# Patient Record
Sex: Male | Born: 1940
Health system: Southern US, Community
[De-identification: ages and names within clinical notes are randomized; demographics above are authoritative.]

## PROBLEM LIST (undated history)

## (undated) DIAGNOSIS — R51 Headache: Secondary | ICD-10-CM

## (undated) DIAGNOSIS — K8020 Calculus of gallbladder without cholecystitis without obstruction: Secondary | ICD-10-CM

## (undated) DIAGNOSIS — R1013 Epigastric pain: Secondary | ICD-10-CM

## (undated) DIAGNOSIS — R220 Localized swelling, mass and lump, head: Secondary | ICD-10-CM

## (undated) DIAGNOSIS — R5383 Other fatigue: Secondary | ICD-10-CM

## (undated) DIAGNOSIS — F329 Major depressive disorder, single episode, unspecified: Secondary | ICD-10-CM

## (undated) DIAGNOSIS — N478 Other disorders of prepuce: Secondary | ICD-10-CM

## (undated) DIAGNOSIS — R221 Localized swelling, mass and lump, neck: Secondary | ICD-10-CM

## (undated) DIAGNOSIS — N471 Phimosis: Secondary | ICD-10-CM

## (undated) DIAGNOSIS — H6690 Otitis media, unspecified, unspecified ear: Secondary | ICD-10-CM

## (undated) DIAGNOSIS — R42 Dizziness and giddiness: Secondary | ICD-10-CM

## (undated) DIAGNOSIS — E669 Obesity, unspecified: Secondary | ICD-10-CM

## (undated) DIAGNOSIS — I498 Other specified cardiac arrhythmias: Secondary | ICD-10-CM

## (undated) DIAGNOSIS — E119 Type 2 diabetes mellitus without complications: Secondary | ICD-10-CM

## (undated) DIAGNOSIS — R143 Flatulence: Secondary | ICD-10-CM

## (undated) DIAGNOSIS — R141 Gas pain: Secondary | ICD-10-CM

## (undated) DIAGNOSIS — R142 Eructation: Secondary | ICD-10-CM

## (undated) DIAGNOSIS — Z86010 Personal history of colonic polyps: Secondary | ICD-10-CM

## (undated) DIAGNOSIS — R972 Elevated prostate specific antigen [PSA]: Secondary | ICD-10-CM

## (undated) DIAGNOSIS — M1710 Unilateral primary osteoarthritis, unspecified knee: Secondary | ICD-10-CM

## (undated) DIAGNOSIS — R5381 Other malaise: Secondary | ICD-10-CM

## (undated) DIAGNOSIS — E041 Nontoxic single thyroid nodule: Secondary | ICD-10-CM

## (undated) DIAGNOSIS — Z9049 Acquired absence of other specified parts of digestive tract: Secondary | ICD-10-CM

## (undated) DIAGNOSIS — I10 Essential (primary) hypertension: Secondary | ICD-10-CM

## (undated) DIAGNOSIS — G5600 Carpal tunnel syndrome, unspecified upper limb: Secondary | ICD-10-CM

## (undated) DIAGNOSIS — R079 Chest pain, unspecified: Secondary | ICD-10-CM

## (undated) DIAGNOSIS — K219 Gastro-esophageal reflux disease without esophagitis: Secondary | ICD-10-CM

## (undated) DIAGNOSIS — E785 Hyperlipidemia, unspecified: Secondary | ICD-10-CM

## (undated) DIAGNOSIS — C73 Malignant neoplasm of thyroid gland: Secondary | ICD-10-CM

## (undated) HISTORY — DX: Headache: R51

## (undated) HISTORY — PX: CHOLECYSTECTOMY: SHX55

## (undated) HISTORY — PX: OTHER SURGICAL HISTORY: SHX169

## (undated) HISTORY — DX: Gas pain: R14.1

## (undated) HISTORY — DX: Obesity, unspecified: E66.9

## (undated) HISTORY — DX: Calculus of gallbladder without cholecystitis without obstruction: K80.20

## (undated) HISTORY — DX: Acquired absence of other specified parts of digestive tract: Z90.49

## (undated) HISTORY — DX: Eructation: R14.2

## (undated) HISTORY — DX: Malignant neoplasm of thyroid gland: C73

## (undated) HISTORY — DX: Gastro-esophageal reflux disease without esophagitis: K21.9

## (undated) HISTORY — DX: Unilateral primary osteoarthritis, unspecified knee: M17.10

## (undated) HISTORY — DX: Major depressive disorder, single episode, unspecified: F32.9

## (undated) HISTORY — DX: Personal history of colonic polyps: Z86.010

## (undated) HISTORY — DX: Essential (primary) hypertension: I10

## (undated) HISTORY — DX: Phimosis: N47.1

## (undated) HISTORY — DX: Type 2 diabetes mellitus without complications: E11.9

## (undated) HISTORY — DX: Nontoxic single thyroid nodule: E04.1

## (undated) HISTORY — DX: Localized swelling, mass and lump, neck: R22.1

## (undated) HISTORY — DX: Dizziness and giddiness: R42

## (undated) HISTORY — PX: THYROID SURGERY: SHX805

## (undated) HISTORY — DX: Other fatigue: R53.83

## (undated) HISTORY — DX: Flatulence: R14.3

## (undated) HISTORY — DX: Elevated prostate specific antigen (PSA): R97.20

## (undated) HISTORY — DX: Other disorders of prepuce: N47.8

## (undated) HISTORY — DX: Localized swelling, mass and lump, head: R22.0

## (undated) HISTORY — DX: Epigastric pain: R10.13

## (undated) HISTORY — DX: Carpal tunnel syndrome, unspecified upper limb: G56.00

## (undated) HISTORY — DX: Other malaise: R53.81

## (undated) HISTORY — DX: Chest pain, unspecified: R07.9

## (undated) HISTORY — DX: Other specified cardiac arrhythmias: I49.8

## (undated) HISTORY — DX: Hyperlipidemia, unspecified: E78.5

## (undated) HISTORY — DX: Otitis media, unspecified, unspecified ear: H66.90

---

## 2004-01-29 ENCOUNTER — Ambulatory Visit: Payer: Self-pay | Admitting: Internal Medicine

## 2004-02-06 ENCOUNTER — Ambulatory Visit: Payer: Self-pay | Admitting: Internal Medicine

## 2005-06-06 ENCOUNTER — Ambulatory Visit: Payer: Self-pay | Admitting: Internal Medicine

## 2005-06-09 ENCOUNTER — Encounter: Payer: Self-pay | Admitting: Internal Medicine

## 2005-06-09 LAB — CONVERTED CEMR LAB: PSA: 1.46 ng/mL

## 2005-06-18 ENCOUNTER — Ambulatory Visit: Payer: Self-pay

## 2005-06-24 ENCOUNTER — Ambulatory Visit: Payer: Self-pay

## 2005-06-24 ENCOUNTER — Encounter: Payer: Self-pay | Admitting: Cardiology

## 2006-01-26 ENCOUNTER — Ambulatory Visit: Payer: Self-pay | Admitting: Internal Medicine

## 2006-01-27 ENCOUNTER — Ambulatory Visit: Payer: Self-pay | Admitting: Internal Medicine

## 2006-01-27 LAB — CONVERTED CEMR LAB
Chloride: 105 meq/L (ref 96–112)
Creatinine, Ser: 1.4 mg/dL (ref 0.4–1.5)
LDL Cholesterol: 106 mg/dL — ABNORMAL HIGH (ref 0–99)
Potassium: 4 meq/L (ref 3.5–5.1)
Sodium: 141 meq/L (ref 135–145)
VLDL: 19 mg/dL (ref 0–40)

## 2006-03-09 ENCOUNTER — Ambulatory Visit: Payer: Self-pay | Admitting: Internal Medicine

## 2006-03-23 ENCOUNTER — Ambulatory Visit: Payer: Self-pay | Admitting: Internal Medicine

## 2006-04-06 ENCOUNTER — Ambulatory Visit: Payer: Self-pay | Admitting: Internal Medicine

## 2006-04-27 ENCOUNTER — Encounter: Payer: Self-pay | Admitting: Family Medicine

## 2006-04-27 ENCOUNTER — Ambulatory Visit: Payer: Self-pay | Admitting: Internal Medicine

## 2006-04-27 ENCOUNTER — Encounter (INDEPENDENT_AMBULATORY_CARE_PROVIDER_SITE_OTHER): Payer: Self-pay | Admitting: Specialist

## 2006-10-21 ENCOUNTER — Encounter: Payer: Self-pay | Admitting: Internal Medicine

## 2006-10-21 DIAGNOSIS — I10 Essential (primary) hypertension: Secondary | ICD-10-CM

## 2006-10-21 HISTORY — DX: Essential (primary) hypertension: I10

## 2006-10-24 ENCOUNTER — Encounter: Payer: Self-pay | Admitting: Internal Medicine

## 2006-10-24 DIAGNOSIS — E669 Obesity, unspecified: Secondary | ICD-10-CM | POA: Insufficient documentation

## 2006-10-24 DIAGNOSIS — I499 Cardiac arrhythmia, unspecified: Secondary | ICD-10-CM | POA: Insufficient documentation

## 2006-10-24 DIAGNOSIS — I498 Other specified cardiac arrhythmias: Secondary | ICD-10-CM

## 2006-10-24 DIAGNOSIS — IMO0002 Reserved for concepts with insufficient information to code with codable children: Secondary | ICD-10-CM

## 2006-10-24 DIAGNOSIS — M1990 Unspecified osteoarthritis, unspecified site: Secondary | ICD-10-CM

## 2006-10-24 HISTORY — DX: Obesity, unspecified: E66.9

## 2006-10-24 HISTORY — DX: Other specified cardiac arrhythmias: I49.8

## 2006-10-24 HISTORY — DX: Reserved for concepts with insufficient information to code with codable children: IMO0002

## 2007-02-01 ENCOUNTER — Telehealth (INDEPENDENT_AMBULATORY_CARE_PROVIDER_SITE_OTHER): Payer: Self-pay | Admitting: *Deleted

## 2007-02-15 ENCOUNTER — Ambulatory Visit: Admission: RE | Admit: 2007-02-15 | Discharge: 2007-02-15 | Payer: Self-pay | Admitting: Specialist

## 2007-02-15 ENCOUNTER — Encounter (INDEPENDENT_AMBULATORY_CARE_PROVIDER_SITE_OTHER): Payer: Self-pay | Admitting: Specialist

## 2007-02-15 ENCOUNTER — Encounter: Payer: Self-pay | Admitting: Internal Medicine

## 2007-02-15 ENCOUNTER — Ambulatory Visit: Payer: Self-pay | Admitting: Surgery

## 2007-04-02 ENCOUNTER — Encounter: Admission: RE | Admit: 2007-04-02 | Discharge: 2007-04-02 | Payer: Self-pay | Admitting: Specialist

## 2007-04-06 ENCOUNTER — Telehealth: Payer: Self-pay | Admitting: Internal Medicine

## 2007-04-07 ENCOUNTER — Encounter: Payer: Self-pay | Admitting: Internal Medicine

## 2007-04-07 ENCOUNTER — Telehealth: Payer: Self-pay | Admitting: Internal Medicine

## 2007-04-21 ENCOUNTER — Encounter: Payer: Self-pay | Admitting: Internal Medicine

## 2007-04-26 ENCOUNTER — Encounter: Payer: Self-pay | Admitting: Internal Medicine

## 2007-04-28 ENCOUNTER — Ambulatory Visit: Payer: Self-pay | Admitting: Internal Medicine

## 2007-04-28 DIAGNOSIS — E785 Hyperlipidemia, unspecified: Secondary | ICD-10-CM

## 2007-04-28 DIAGNOSIS — Z860100 Personal history of colon polyps, unspecified: Secondary | ICD-10-CM

## 2007-04-28 DIAGNOSIS — R5381 Other malaise: Secondary | ICD-10-CM | POA: Insufficient documentation

## 2007-04-28 DIAGNOSIS — R5383 Other fatigue: Secondary | ICD-10-CM

## 2007-04-28 DIAGNOSIS — E119 Type 2 diabetes mellitus without complications: Secondary | ICD-10-CM

## 2007-04-28 HISTORY — DX: Other malaise: R53.81

## 2007-04-28 HISTORY — DX: Personal history of colonic polyps: Z86.010

## 2007-04-28 HISTORY — DX: Personal history of colon polyps, unspecified: Z86.0100

## 2007-04-28 HISTORY — DX: Type 2 diabetes mellitus without complications: E11.9

## 2007-04-28 HISTORY — DX: Hyperlipidemia, unspecified: E78.5

## 2007-04-29 LAB — CONVERTED CEMR LAB
Albumin: 4.1 g/dL (ref 3.5–5.2)
Alkaline Phosphatase: 59 units/L (ref 39–117)
BUN: 22 mg/dL (ref 6–23)
Basophils Absolute: 0 10*3/uL (ref 0.0–0.1)
Cholesterol: 187 mg/dL (ref 0–200)
Creatinine,U: 155.3 mg/dL
Eosinophils Absolute: 0.2 10*3/uL (ref 0.0–0.6)
GFR calc non Af Amer: 64 mL/min
HCT: 46.1 % (ref 39.0–52.0)
HDL: 36.6 mg/dL — ABNORMAL LOW (ref >39.0)
Hemoglobin: 15.7 g/dL (ref 13.0–17.0)
Hgb A1c MFr Bld: 6.9 % — ABNORMAL HIGH (ref 4.6–6.0)
LDL Cholesterol: 117 mg/dL — ABNORMAL HIGH (ref 0–99)
MCHC: 34 g/dL (ref 30.0–36.0)
MCV: 87.5 fL (ref 78.0–100.0)
Microalb Creat Ratio: 10.9 mg/g (ref 0.0–30.0)
Microalb, Ur: 1.7 mg/dL (ref 0.0–1.9)
Monocytes Absolute: 0.5 10*3/uL (ref 0.2–0.7)
Monocytes Relative: 6.9 % (ref 3.0–11.0)
Neutrophils Relative %: 66.8 % (ref 43.0–77.0)
PSA: 1.72 ng/mL (ref 0.10–4.00)
Potassium: 4 meq/L (ref 3.5–5.1)
RBC: 5.27 M/uL (ref 4.22–5.81)
RDW: 12.7 % (ref 11.5–14.6)
Sodium: 141 meq/L (ref 135–145)
TSH: 1.73 microintl units/mL (ref 0.35–5.50)
Total CHOL/HDL Ratio: 5.1

## 2007-05-11 ENCOUNTER — Encounter: Payer: Self-pay | Admitting: Internal Medicine

## 2007-06-30 ENCOUNTER — Ambulatory Visit: Payer: Self-pay | Admitting: Internal Medicine

## 2007-07-09 ENCOUNTER — Ambulatory Visit: Payer: Self-pay | Admitting: Internal Medicine

## 2008-02-25 ENCOUNTER — Encounter: Payer: Self-pay | Admitting: Internal Medicine

## 2008-03-01 ENCOUNTER — Telehealth (INDEPENDENT_AMBULATORY_CARE_PROVIDER_SITE_OTHER): Payer: Self-pay | Admitting: *Deleted

## 2008-04-27 ENCOUNTER — Ambulatory Visit: Payer: Self-pay | Admitting: Internal Medicine

## 2008-05-01 LAB — CONVERTED CEMR LAB
ALT: 28 units/L (ref 0–53)
AST: 26 units/L (ref 0–37)
Basophils Relative: 1 % (ref 0.0–3.0)
Bilirubin, Direct: 0.2 mg/dL (ref 0.0–0.3)
CO2: 27 meq/L (ref 19–32)
Chloride: 105 meq/L (ref 96–112)
Creatinine, Ser: 1.3 mg/dL (ref 0.4–1.5)
Creatinine,U: 113.9 mg/dL
Eosinophils Relative: 3.5 % (ref 0.0–5.0)
Folate: >20 ng/mL
Glucose, Bld: 112 mg/dL — ABNORMAL HIGH (ref 70–99)
HCT: 43.9 % (ref 39.0–52.0)
HDL: 29.8 mg/dL — ABNORMAL LOW (ref >39.0)
Hemoglobin: 15.4 g/dL (ref 13.0–17.0)
Monocytes Absolute: 0.6 10*3/uL (ref 0.1–1.0)
Monocytes Relative: 9.8 % (ref 3.0–12.0)
PSA: 1.36 ng/mL (ref 0.10–4.00)
Platelets: 266 10*3/uL (ref 150–400)
RBC: 5.03 M/uL (ref 4.22–5.81)
TSH: 1.14 microintl units/mL (ref 0.35–5.50)
Total Bilirubin: 1.5 mg/dL — ABNORMAL HIGH (ref 0.3–1.2)
Total CHOL/HDL Ratio: 6.3
WBC: 6.4 10*3/uL (ref 4.5–10.5)

## 2008-05-03 ENCOUNTER — Telehealth: Payer: Self-pay | Admitting: Internal Medicine

## 2008-05-03 ENCOUNTER — Telehealth (INDEPENDENT_AMBULATORY_CARE_PROVIDER_SITE_OTHER): Payer: Self-pay | Admitting: *Deleted

## 2008-08-22 ENCOUNTER — Ambulatory Visit: Payer: Self-pay | Admitting: Cardiology

## 2008-08-22 ENCOUNTER — Telehealth: Payer: Self-pay | Admitting: Internal Medicine

## 2008-08-22 ENCOUNTER — Emergency Department (HOSPITAL_COMMUNITY): Admission: EM | Admit: 2008-08-22 | Discharge: 2008-08-22 | Payer: Self-pay | Admitting: Family Medicine

## 2008-08-22 ENCOUNTER — Inpatient Hospital Stay (HOSPITAL_COMMUNITY): Admission: EM | Admit: 2008-08-22 | Discharge: 2008-08-23 | Payer: Self-pay | Admitting: Emergency Medicine

## 2008-08-30 ENCOUNTER — Telehealth (INDEPENDENT_AMBULATORY_CARE_PROVIDER_SITE_OTHER): Payer: Self-pay | Admitting: *Deleted

## 2008-08-31 ENCOUNTER — Ambulatory Visit: Payer: Self-pay

## 2008-08-31 ENCOUNTER — Encounter: Payer: Self-pay | Admitting: Cardiology

## 2008-09-05 ENCOUNTER — Ambulatory Visit: Payer: Self-pay | Admitting: Cardiology

## 2008-09-05 DIAGNOSIS — R079 Chest pain, unspecified: Secondary | ICD-10-CM

## 2008-09-05 HISTORY — DX: Chest pain, unspecified: R07.9

## 2008-09-07 ENCOUNTER — Encounter: Payer: Self-pay | Admitting: Internal Medicine

## 2009-01-18 ENCOUNTER — Encounter (INDEPENDENT_AMBULATORY_CARE_PROVIDER_SITE_OTHER): Payer: Self-pay | Admitting: *Deleted

## 2009-01-31 ENCOUNTER — Telehealth: Payer: Self-pay | Admitting: Internal Medicine

## 2009-02-07 ENCOUNTER — Ambulatory Visit: Payer: Self-pay | Admitting: Internal Medicine

## 2009-02-07 DIAGNOSIS — N478 Other disorders of prepuce: Secondary | ICD-10-CM

## 2009-02-07 DIAGNOSIS — G5600 Carpal tunnel syndrome, unspecified upper limb: Secondary | ICD-10-CM

## 2009-02-07 DIAGNOSIS — G5603 Carpal tunnel syndrome, bilateral upper limbs: Secondary | ICD-10-CM | POA: Insufficient documentation

## 2009-02-07 DIAGNOSIS — N471 Phimosis: Secondary | ICD-10-CM

## 2009-02-07 HISTORY — DX: Carpal tunnel syndrome, unspecified upper limb: G56.00

## 2009-02-07 HISTORY — DX: Other disorders of prepuce: N47.8

## 2009-02-11 DIAGNOSIS — K219 Gastro-esophageal reflux disease without esophagitis: Secondary | ICD-10-CM

## 2009-02-11 HISTORY — DX: Gastro-esophageal reflux disease without esophagitis: K21.9

## 2009-02-19 ENCOUNTER — Encounter (INDEPENDENT_AMBULATORY_CARE_PROVIDER_SITE_OTHER): Payer: Self-pay | Admitting: *Deleted

## 2009-03-13 ENCOUNTER — Encounter: Payer: Self-pay | Admitting: Internal Medicine

## 2009-04-03 ENCOUNTER — Encounter: Payer: Self-pay | Admitting: Internal Medicine

## 2009-04-03 ENCOUNTER — Telehealth (INDEPENDENT_AMBULATORY_CARE_PROVIDER_SITE_OTHER): Payer: Self-pay | Admitting: *Deleted

## 2009-04-05 ENCOUNTER — Ambulatory Visit (HOSPITAL_BASED_OUTPATIENT_CLINIC_OR_DEPARTMENT_OTHER): Admission: RE | Admit: 2009-04-05 | Discharge: 2009-04-05 | Payer: Self-pay | Admitting: Urology

## 2009-04-10 ENCOUNTER — Encounter: Payer: Self-pay | Admitting: Internal Medicine

## 2009-04-18 ENCOUNTER — Encounter: Payer: Self-pay | Admitting: Internal Medicine

## 2009-05-04 ENCOUNTER — Encounter: Payer: Self-pay | Admitting: Internal Medicine

## 2009-05-16 ENCOUNTER — Encounter: Payer: Self-pay | Admitting: Internal Medicine

## 2009-05-17 ENCOUNTER — Ambulatory Visit: Payer: Self-pay | Admitting: Internal Medicine

## 2009-05-21 ENCOUNTER — Telehealth: Payer: Self-pay | Admitting: Internal Medicine

## 2009-05-28 ENCOUNTER — Telehealth: Payer: Self-pay | Admitting: Internal Medicine

## 2009-05-31 ENCOUNTER — Encounter (INDEPENDENT_AMBULATORY_CARE_PROVIDER_SITE_OTHER): Payer: Self-pay | Admitting: Orthopedic Surgery

## 2009-05-31 ENCOUNTER — Ambulatory Visit (HOSPITAL_COMMUNITY)
Admission: RE | Admit: 2009-05-31 | Discharge: 2009-05-31 | Payer: Self-pay | Source: Home / Self Care | Admitting: Orthopedic Surgery

## 2009-05-31 ENCOUNTER — Ambulatory Visit: Payer: Self-pay | Admitting: Vascular Surgery

## 2009-12-13 ENCOUNTER — Ambulatory Visit: Payer: Self-pay | Admitting: Internal Medicine

## 2009-12-13 LAB — CONVERTED CEMR LAB
BUN: 25 mg/dL — ABNORMAL HIGH (ref 6–23)
CO2: 26 meq/L (ref 19–32)
Calcium: 9.5 mg/dL (ref 8.4–10.5)
Cholesterol: 163 mg/dL (ref 0–200)
Creatinine, Ser: 1.3 mg/dL (ref 0.4–1.5)
Glucose, Bld: 176 mg/dL — ABNORMAL HIGH (ref 70–99)
LDL Cholesterol: 107 mg/dL — ABNORMAL HIGH (ref 0–99)
Total CHOL/HDL Ratio: 5

## 2009-12-19 ENCOUNTER — Telehealth: Payer: Self-pay | Admitting: Internal Medicine

## 2009-12-19 ENCOUNTER — Encounter: Payer: Self-pay | Admitting: Internal Medicine

## 2009-12-19 DIAGNOSIS — R42 Dizziness and giddiness: Secondary | ICD-10-CM

## 2009-12-19 DIAGNOSIS — R519 Headache, unspecified: Secondary | ICD-10-CM | POA: Insufficient documentation

## 2009-12-19 DIAGNOSIS — R51 Headache: Secondary | ICD-10-CM | POA: Insufficient documentation

## 2009-12-19 HISTORY — DX: Dizziness and giddiness: R42

## 2009-12-19 HISTORY — DX: Headache: R51

## 2009-12-21 ENCOUNTER — Encounter: Payer: Self-pay | Admitting: Internal Medicine

## 2009-12-24 ENCOUNTER — Ambulatory Visit: Payer: Self-pay | Admitting: Cardiology

## 2009-12-24 ENCOUNTER — Telehealth: Payer: Self-pay | Admitting: Internal Medicine

## 2009-12-26 ENCOUNTER — Ambulatory Visit: Payer: Self-pay | Admitting: Internal Medicine

## 2009-12-26 DIAGNOSIS — H6690 Otitis media, unspecified, unspecified ear: Secondary | ICD-10-CM

## 2009-12-26 HISTORY — DX: Otitis media, unspecified, unspecified ear: H66.90

## 2009-12-28 ENCOUNTER — Encounter: Admission: RE | Admit: 2009-12-28 | Discharge: 2009-12-28 | Payer: Self-pay | Admitting: Orthopedic Surgery

## 2010-01-01 ENCOUNTER — Telehealth: Payer: Self-pay | Admitting: Internal Medicine

## 2010-01-02 ENCOUNTER — Ambulatory Visit: Payer: Self-pay | Admitting: Internal Medicine

## 2010-01-08 ENCOUNTER — Encounter: Payer: Self-pay | Admitting: Internal Medicine

## 2010-01-17 ENCOUNTER — Encounter: Admission: RE | Admit: 2010-01-17 | Discharge: 2010-01-17 | Payer: Self-pay | Admitting: Diagnostic Neuroimaging

## 2010-01-17 ENCOUNTER — Encounter: Payer: Self-pay | Admitting: Internal Medicine

## 2010-01-18 ENCOUNTER — Encounter: Payer: Self-pay | Admitting: Internal Medicine

## 2010-01-21 ENCOUNTER — Ambulatory Visit: Payer: Self-pay

## 2010-01-21 ENCOUNTER — Encounter: Payer: Self-pay | Admitting: Internal Medicine

## 2010-01-23 ENCOUNTER — Encounter: Payer: Self-pay | Admitting: Internal Medicine

## 2010-01-25 ENCOUNTER — Encounter: Payer: Self-pay | Admitting: Internal Medicine

## 2010-01-25 ENCOUNTER — Encounter: Admission: RE | Admit: 2010-01-25 | Discharge: 2010-01-25 | Payer: Self-pay | Admitting: Internal Medicine

## 2010-02-07 ENCOUNTER — Ambulatory Visit: Payer: Self-pay | Admitting: Endocrinology

## 2010-02-07 ENCOUNTER — Other Ambulatory Visit
Admission: RE | Admit: 2010-02-07 | Discharge: 2010-02-07 | Payer: Self-pay | Source: Home / Self Care | Admitting: Endocrinology

## 2010-02-07 DIAGNOSIS — E041 Nontoxic single thyroid nodule: Secondary | ICD-10-CM

## 2010-02-07 DIAGNOSIS — R220 Localized swelling, mass and lump, head: Secondary | ICD-10-CM

## 2010-02-07 DIAGNOSIS — R221 Localized swelling, mass and lump, neck: Secondary | ICD-10-CM

## 2010-02-07 HISTORY — DX: Nontoxic single thyroid nodule: E04.1

## 2010-02-07 HISTORY — DX: Localized swelling, mass and lump, head: R22.0

## 2010-02-07 HISTORY — DX: Localized swelling, mass and lump, neck: R22.1

## 2010-02-21 ENCOUNTER — Encounter: Payer: Self-pay | Admitting: Endocrinology

## 2010-02-28 ENCOUNTER — Encounter: Payer: Self-pay | Admitting: Internal Medicine

## 2010-02-28 ENCOUNTER — Encounter
Admission: RE | Admit: 2010-02-28 | Discharge: 2010-02-28 | Payer: Self-pay | Source: Home / Self Care | Attending: Otolaryngology | Admitting: Otolaryngology

## 2010-03-05 ENCOUNTER — Ambulatory Visit
Admission: RE | Admit: 2010-03-05 | Discharge: 2010-03-05 | Payer: Self-pay | Source: Home / Self Care | Attending: Internal Medicine | Admitting: Internal Medicine

## 2010-03-07 ENCOUNTER — Emergency Department (HOSPITAL_COMMUNITY)
Admission: EM | Admit: 2010-03-07 | Discharge: 2010-03-07 | Payer: Self-pay | Source: Home / Self Care | Admitting: Emergency Medicine

## 2010-03-27 ENCOUNTER — Encounter: Payer: Self-pay | Admitting: Endocrinology

## 2010-03-27 ENCOUNTER — Telehealth: Payer: Self-pay | Admitting: Endocrinology

## 2010-04-03 ENCOUNTER — Ambulatory Visit
Admission: RE | Admit: 2010-04-03 | Discharge: 2010-04-03 | Payer: Self-pay | Source: Home / Self Care | Attending: Endocrinology | Admitting: Endocrinology

## 2010-04-04 ENCOUNTER — Other Ambulatory Visit: Payer: Self-pay | Admitting: Endocrinology

## 2010-04-04 DIAGNOSIS — E041 Nontoxic single thyroid nodule: Secondary | ICD-10-CM

## 2010-04-07 LAB — CONVERTED CEMR LAB
Basophils Absolute: 0 10*3/uL (ref 0.0–0.1)
Cholesterol: 184 mg/dL (ref 0–200)
Eosinophils Absolute: 0.2 10*3/uL (ref 0.0–0.7)
GFR calc non Af Amer: 63.47 mL/min (ref >60)
HDL: 43.1 mg/dL (ref >39.00)
Hemoglobin: 15.1 g/dL (ref 13.0–17.0)
Iron: 114 ug/dL (ref 42–165)
Ketones, ur: NEGATIVE mg/dL
Leukocytes, UA: NEGATIVE
Lymphocytes Relative: 22.1 % (ref 12.0–46.0)
MCHC: 33 g/dL (ref 30.0–36.0)
Neutro Abs: 4.4 10*3/uL (ref 1.4–7.7)
Neutrophils Relative %: 66.8 % (ref 43.0–77.0)
Potassium: 4.5 meq/L (ref 3.5–5.1)
RDW: 12.3 % (ref 11.5–14.6)
Saturation Ratios: 34.8 % (ref 20.0–50.0)
Sed Rate: 9 mm/hr (ref 0–22)
Sodium: 140 meq/L (ref 135–145)
Specific Gravity, Urine: >=1.03 (ref 1.000–1.030)
Urobilinogen, UA: 0.2 (ref 0.0–1.0)
VLDL: 20.8 mg/dL (ref 0.0–40.0)
Vit D, 25-Hydroxy: 29 ng/mL — ABNORMAL LOW (ref 30–89)
Vitamin B-12: 404 pg/mL (ref 211–911)

## 2010-04-08 ENCOUNTER — Encounter: Payer: Self-pay | Admitting: Endocrinology

## 2010-04-10 ENCOUNTER — Other Ambulatory Visit: Payer: Self-pay | Admitting: Interventional Radiology

## 2010-04-10 ENCOUNTER — Other Ambulatory Visit (HOSPITAL_COMMUNITY)
Admission: RE | Admit: 2010-04-10 | Payer: Medicare Other | Source: Ambulatory Visit | Admitting: Interventional Radiology

## 2010-04-10 ENCOUNTER — Ambulatory Visit
Admission: RE | Admit: 2010-04-10 | Discharge: 2010-04-10 | Disposition: A | Discharge status: Home / Self Care | Payer: Medicare Other | Source: Ambulatory Visit | Attending: Endocrinology | Admitting: Endocrinology

## 2010-04-10 ENCOUNTER — Other Ambulatory Visit (HOSPITAL_COMMUNITY)
Admission: RE | Admit: 2010-04-10 | Discharge: 2010-04-10 | Disposition: A | Discharge status: Home / Self Care | Payer: Medicare Other | Source: Ambulatory Visit | Attending: Interventional Radiology | Admitting: Interventional Radiology

## 2010-04-10 ENCOUNTER — Encounter: Payer: Self-pay | Admitting: Endocrinology

## 2010-04-10 ENCOUNTER — Telehealth: Payer: Self-pay | Admitting: Endocrinology

## 2010-04-10 DIAGNOSIS — E049 Nontoxic goiter, unspecified: Secondary | ICD-10-CM | POA: Insufficient documentation

## 2010-04-10 DIAGNOSIS — E041 Nontoxic single thyroid nodule: Secondary | ICD-10-CM

## 2010-04-11 ENCOUNTER — Ambulatory Visit (INDEPENDENT_AMBULATORY_CARE_PROVIDER_SITE_OTHER): Payer: Medicare Other | Admitting: Internal Medicine

## 2010-04-11 ENCOUNTER — Encounter: Payer: Self-pay | Admitting: Internal Medicine

## 2010-04-11 DIAGNOSIS — R079 Chest pain, unspecified: Secondary | ICD-10-CM

## 2010-04-11 DIAGNOSIS — R1013 Epigastric pain: Secondary | ICD-10-CM

## 2010-04-11 DIAGNOSIS — I10 Essential (primary) hypertension: Secondary | ICD-10-CM

## 2010-04-11 DIAGNOSIS — K219 Gastro-esophageal reflux disease without esophagitis: Secondary | ICD-10-CM

## 2010-04-11 HISTORY — DX: Epigastric pain: R10.13

## 2010-04-11 NOTE — Miscellaneous (Signed)
Summary: Orders Update   Clinical Lists Changes  Problems: Added new problem of THYROID NODULE, LEFT (ICD-241.0) Orders: Added new Referral order of Radiology Referral (Radiology) - Signed

## 2010-04-11 NOTE — Consult Note (Signed)
Summary: [facility]  [facility]   Imported By: Phillis Knack 02/28/2010 09:03:31  _____________________________________________________________________  External Attachment:    Type:   Image     Comment:   External Document

## 2010-04-11 NOTE — Letter (Signed)
Summary: [facility]   Imported By: Phillis Knack 12/31/2009 15:51:28  _____________________________________________________________________  External Attachment:    Type:   Image     Comment:   External Document
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
## 2010-04-11 NOTE — Letter (Signed)
Summary: [facility]  [facility]   Imported By: Phillis Knack 04/24/2009 08:06:30  _____________________________________________________________________  External Attachment:    Type:   Image     Comment:   External Document
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
## 2010-04-11 NOTE — Medication Information (Signed)
Summary: [facility]  [facility]   Imported By: Bubba Hales 01/01/2010 08:14:03  _____________________________________________________________________  External Attachment:    Type:   Image     Comment:   External Document

## 2010-04-11 NOTE — Letter (Signed)
Summary: [facility]   Imported By: Phillis Knack 04/18/2009 10:26:53  _____________________________________________________________________  External Attachment:    Type:   Image     Comment:   External Document

## 2010-04-11 NOTE — Letter (Signed)
Summary: [facility]  [facility]   Imported By: Phillis Knack 05/21/2009 09:37:32  _____________________________________________________________________  External Attachment:    Type:   Image     Comment:   External Document

## 2010-04-11 NOTE — Consult Note (Signed)
Summary: Jerrell [place] MD/[facility]  Jerrell [place] MD/[facility]   Imported By: Bubba Hales 03/29/2010 10:47:23  _____________________________________________________________________  External Attachment:    Type:   Image     Comment:   External Document

## 2010-04-11 NOTE — Progress Notes (Signed)
Summary: Diabetic supply  Phone Note Call from Patient Call back at Work Phone [phone]   Caller: Patient Summary of Call: pt called requesting Rx for diabetic testing supply, pt tests 4 x a day and uses Accu Chek. pt request RX to [facility] mail order. Initial call taken by: Crissie Sickles, [place],  May 21, 2009 2:53 PM  Follow-up for Phone Call        ok - to robin to handle Follow-up by: Biagio Borg MD,  May 21, 2009 5:34 PM    New/Updated Medications: ACCU-CHEK INSTANT GLUCOSE TEST  STRP (GLUCOSE BLOOD) use as directed 4 times a day Prescriptions: ACCU-CHEK INSTANT GLUCOSE TEST  STRP (GLUCOSE BLOOD) use as directed 4 times a day  #100 x 6   Entered by:   Sharon Seller   Authorized by:   Biagio Borg MD   Signed by:   Sharon Seller on 05/22/2009   Method used:   Faxed to ...       [facility] (mail-order)             , [place]         Ph: [phone]       Fax: [phone]   RxID:   [id number] ACCU-CHEK INSTANT GLUCOSE TEST  STRP (GLUCOSE BLOOD) use as directed 4 times a day  #0 x 6   Entered by:   Sharon Seller   Authorized by:   Biagio Borg MD   Signed by:   Sharon Seller on 05/22/2009   Method used:   Faxed to ...       [facility] (mail-order)             , [place]         Ph: [phone]       Fax: [phone]   RxID:   [id number]
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
## 2010-04-11 NOTE — Miscellaneous (Signed)
Summary: Orders Update   Clinical Lists Changes  Orders: Added new Referral order of Endocrinology Referral (Endocrine) - Signed

## 2010-04-11 NOTE — Letter (Signed)
Summary: [facility]  [facility]   Imported By: Bubba Hales 05/10/2009 08:38:22  _____________________________________________________________________  External Attachment:    Type:   Image     Comment:   External Document

## 2010-04-11 NOTE — Letter (Signed)
Summary[facility]   Imported By: Phillis Knack 12/25/2009 13:46:01  _____________________________________________________________________  External Attachment:    Type:   Image     Comment:   External Document
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
## 2010-04-11 NOTE — Miscellaneous (Signed)
Summary: Orders Update  Clinical Lists Changes  Orders: Added new Test order of Carotid Duplex (Carotid Duplex) - Signed 

## 2010-04-11 NOTE — Letter (Signed)
Summary: [facility]   Imported By: Phillis Knack 03/12/2010 07:46:57  _____________________________________________________________________  External Attachment:    Type:   Image     Comment:   External Document

## 2010-04-11 NOTE — Progress Notes (Signed)
Summary: Referral  Phone Note Call from Patient Call back at Home Phone [phone]   Caller: Patient Summary of Call: Pt called to request referral to Neurology for dizziness. Initial call taken by: Crissie Sickles, [place],  January 01, 2010 10:12 AM  Follow-up for Phone Call        ok - I will do Follow-up by: Biagio Borg MD,  January 01, 2010 1:04 PM  Additional Follow-up for Phone Call Additional follow up Details #1::        Pt advised and will expect a call from [facility] with appt info Additional Follow-up by: Crissie Sickles, [place],  January 01, 2010 1:08 PM

## 2010-04-11 NOTE — Letter (Signed)
Summary: [facility]   Imported By: Rise Patience 12/27/2009 11:22:16  _____________________________________________________________________  External Attachment:    Type:   Image     Comment:   External Document

## 2010-04-11 NOTE — Letter (Signed)
Summary: [facility]  [facility]   Imported By: Phillis Knack 04/13/2009 14:24:00  _____________________________________________________________________  External Attachment:    Type:   Image     Comment:   External Document

## 2010-04-11 NOTE — Progress Notes (Signed)
  Phone Note Refill Request  on May 28, 2009 2:01 PM  Refills Requested: Medication #1:  ACCU-CHEK INSTANT GLUCOSE TEST  STRP use as directed 4 times a day.   Dosage confirmed as above?Dosage Confirmed   Notes: [facility] Initial call taken by: Sharon Seller,  May 28, 2009 2:01 PM    Prescriptions: ACCU-CHEK INSTANT GLUCOSE TEST  STRP (GLUCOSE BLOOD) use as directed 4 times a day  #120 x 6   Entered by:   Sharon Seller   Authorized by:   Biagio Borg MD   Signed by:   Sharon Seller on 05/28/2009   Method used:   Faxed to ...       [facility] (mail-order)             , [place]         Ph: [phone]       Fax: [phone]   RxID:   [id number]

## 2010-04-11 NOTE — Letter (Signed)
Summary: [facility]   Imported By: Phillis Knack 01/04/2010 10:35:13  _____________________________________________________________________  External Attachment:    Type:   Image     Comment:   External Document

## 2010-04-11 NOTE — Miscellaneous (Signed)
Summary: LT Neck Thyroid Bx/[facility]  LT Neck Thyroid Bx/[facility]   Imported By: Phillis Knack 02/11/2010 11:19:34  _____________________________________________________________________  External Attachment:    Type:   Image     Comment:   External Document
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
## 2010-04-11 NOTE — Letter (Signed)
[facility]   Imported By: Bubba Hales 03/16/2009 09:36:05  _____________________________________________________________________  External Attachment:    Type:   Image     Comment:   External Document

## 2010-04-11 NOTE — Assessment & Plan Note (Signed)
Summary: YEARLY FU/ MEDICARE/ LABS SAME DAY/NWS  #--rs'd/cd   Vital Signs:  Patient profile:   70 year old male Height:      72 inches Weight:      251 pounds BMI:     34.16 O2 Sat:      96 % on Room air Temp:     97.7 degrees F oral Pulse rate:   58 / minute BP sitting:   118 / 70  (left arm) Cuff size:   large  Vitals Entered ByShirlean Mylar Ewing (May 17, 2009 8:46 AM)  O2 Flow:  Room air   Primary Care Provider:  Biagio Borg MD   History of Present Illness: still sees Dr Gaynelle Arabian - still wtih marked pain assoc with the recent circumcision; had some wt gain and is going to a revival soon and trying to aovid eating too much there;  Pt denies CP, sob, doe, wheezing, orthopnea, pnd, worsening LE edema, palps, dizziness or syncope  Pt denies new neuro symptoms such as headache, facial or extremity weakness  Pt denies polydipsia, polyuria, or low sugar symptoms such as shakiness improved with eating.  Overall good compliance with meds, trying to follow low chol, DM diet, wt stable, little excercise however  Here for wellness Diet: Heart Healthy or DM if diabetic Physical Activities: Sedentary Depression/mood screen: Negative Hearing: Intact bilateral Visual Acuity: Grossly normal ADL's: Capable  Fall Risk: None Home Safety: Good End-of-Life Planning: Advance directive - Full code/I agree   Problems Prior to Update: 1)  Gerd  (ICD-530.81) 2)  Phimosis  (ICD-605) 3)  Carpal Tunnel Syndrome, Bilateral  (ICD-354.0) 4)  Chest Pain-unspecified  (ICD-786.50) 5)  Special Screening Malig Neoplasms Other Sites  (ICD-V76.49) 6)  Fatigue  (ICD-780.79) 7)  Special Screening Malignant Neoplasm of Prostate  (ICD-V76.44) 8)  Diabetes Mellitus, Type II  (ICD-250.00) 9)  Hyperlipidemia  (ICD-272.4) 10)  Colonic Polyps, Hx of  (ICD-V12.72) 11)  Bradycardia, Chronic  (ICD-427.89) 12)  Obesity  (ICD-278.00) 13)  Degenerative Joint Disease, Right Knee  (ICD-715.96) 14)  Hypertension   (ICD-401.9)  Medications Prior to Update: 1)  Metformin Hcl 500 Mg Tb24 (Metformin Hcl) .[name].. 1 By Mouth in The Am, 1 By Mouth Afternoon, Take 2 By Mouth in The Evening 2)  Amlodipine Besylate 10 Mg Tabs (Amlodipine Besylate) .[name].. 1 By Mouth Once Daily 3)  Ecotrin Low Strength 81 Mg  Tbec (Aspirin) .[name].. 1po Qd 4)  Benazepril Hcl 40 Mg Tabs (Benazepril Hcl) .[name].. 1 By Mouth Once Daily 5)  Fish Oil 1000 Mg Caps (Omega-3 Fatty Acids) .... Take 1 By Mouth Two Times A Day 6)  Cinnamon 500 Mg Caps (Cinnamon) .... Take 1 By Mouth Two Times A Day 7)  Glucosamine-Chondroitin  Caps (Glucosamine-Chondroit-Vit C-Mn) .... Take 1 By Mouth Once Daily 8)  Multivitamins  Caps (Multiple Vitamin) .... Take 1 By Mouth Once Daily 9)  Vit D .... Take 1 By Mouth Once Daily 10)  Odorless Garlic [id number] Mg Tabs (Garlic) .[name].. 1 By Mouth Two Times A Day 11)  Vitamin C 1000 Mg Tabs (Ascorbic Acid) .[name].. 1 By Mouth Once Daily 12)  Calcium 600 Mg Tabs (Calcium) .[name].. 1 By Mouth Once Daily 13)  Omeprazole 20 Mg Cpdr (Omeprazole) .[name].. 1po Once Daily  Current Medications (verified): 1)  Metformin Hcl 500 Mg Tb24 (Metformin Hcl) .[name].. 1 By Mouth in The Am, 1 By Mouth Afternoon, Take 2 By Mouth in The Evening 2)  Amlodipine Besylate 10 Mg Tabs (  Amlodipine Besylate) .[name].. 1 By Mouth Once Daily 3)  Ecotrin Low Strength 81 Mg  Tbec (Aspirin) .[name].. 1po Qd 4)  Benazepril Hcl 40 Mg Tabs (Benazepril Hcl) .[name].. 1 By Mouth Once Daily 5)  Fish Oil 1000 Mg Caps (Omega-3 Fatty Acids) .... Take 1 By Mouth Two Times A Day 6)  Cinnamon 500 Mg Caps (Cinnamon) .... Take 1 By Mouth Two Times A Day 7)  Glucosamine-Chondroitin  Caps (Glucosamine-Chondroit-Vit C-Mn) .... Take 1 By Mouth Once Daily 8)  Multivitamins  Caps (Multiple Vitamin) .... Take 1 By Mouth Once Daily 9)  Vit D .... Take 1 By Mouth Once Daily 10)  Odorless Garlic [id number] Mg Tabs (Garlic) .[name].. 1 By Mouth Two Times A Day 11)  Vitamin C 1000 Mg Tabs (Ascorbic Acid) .[name].. 1 By Mouth Once  Daily 12)  Calcium 600 Mg Tabs (Calcium) .[name].. 1 By Mouth Once Daily 13)  Omeprazole 20 Mg Cpdr (Omeprazole) .[name].. 1po Once Daily  Allergies (verified): 1)  ! * Sulfer 2)  ! * Codiene 3)  ! Mevacor 4)  ! Atenolol 5)  ! Ace Inhibitors  Past History:  Past Medical History: Last updated: 02/07/2009 1. Diabetes mellitus, type II 2. Hypertension 3. R knee DJD, left knee DJD 4. Obesity 5. Chronic Bradycardia (asymptomatic) 6. Colonic polyps, hx of - adenomatous 7. Hyperlipidemia: patient has tried 3 different statins in the past with severe myalgias.  8. lumbar disc disease 9. Adenosine myoview (6/10): EF 63%, normal wall motion, normal perfusion.  Negative test.  GERD  Past Surgical History: Last updated: 10/21/2006 left knee surgery right wrist surgery  Family History: Last updated: 09/05/2008 father with MI at 34, brother with MI at 53.  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         all otherwise negative per pt -  except for mild fatigue, no osa symtpoms  Physical Exam  General:  alert and overweight-appearing.   Head:  normocephalic and atraumatic.   Eyes:  vision grossly intact, pupils equal, and pupils round.   Ears:  R ear normal and L ear normal.   Nose:  no external deformity  and no nasal discharge.   Mouth:  no gingival abnormalities and pharynx pink and moist.   Neck:  supple and no masses.   Lungs:  normal respiratory effort and normal breath sounds.   Heart:  normal rate and regular rhythm.   Abdomen:  soft, non-tender, and normal bowel sounds.   Msk:  no joint tenderness and no joint swelling.   Extremities:  no edema, no erythema  Neurologic:  cranial nerves II-XII intact and strength normal in all extremities.     Impression & Recommendations:  Problem # 1:  Preventive Health Care (ICD-V70.0)  Overall doing well, age appropriate education and counseling updated and referral for appropriate preventive services done unless declined, immunizations up to date or declined, diet counseling done if overweight, urged to quit smoking if smokes , most recent labs reviewed and  current ordered if appropriate, ecg reviewed or declined (interpretation per ECG scanned in the EMR if done); information regarding Medicare Prevention requirements given if appropriate   Orders: First annual wellness visit with prevention plan  [id number])  Problem # 2:  DIABETES MELLITUS, TYPE II (ICD-250.00)  His updated medication list for this problem includes:    Metformin Hcl 500 Mg Tb24 (Metformin hcl) .[name]... 1 by mouth in the am, 1 by mouth afternoon, take 2 by mouth in the evening    Ecotrin Low Strength 81 Mg Tbec (Aspirin) .[name]... 1po qd    Benazepril Hcl 40 Mg Tabs (Benazepril hcl) .[name]... 1 by mouth once daily  Orders: TLB-A1C / Hgb A1C (Glycohemoglobin) (83036-A1C) EKG w/ Interpretation (93000) TLB-BMP (Basic Metabolic Panel-BMET) (99991111) TLB-Lipid Panel (80061-LIPID) TLB-Microalbumin/Creat Ratio, Urine (82043-MALB)  Labs Reviewed: Creat: 1.3 (04/27/2008)    Reviewed HgBA1c results: 6.7 (04/27/2008)  6.9 (04/28/2007) stable overall by hx and exam, ok to continue meds/tx as is   Problem # 3:  HYPERTENSION (ICD-401.9)  His updated medication list for this problem  includes:    Amlodipine Besylate 10 Mg Tabs (Amlodipine besylate) .[name]... 1 by mouth once daily    Benazepril Hcl 40 Mg Tabs (Benazepril hcl) .[name]... 1 by mouth once daily  BP today: 118/70 Prior BP: 132/72 (02/07/2009)  Labs Reviewed: K+: 4.3 (04/27/2008) Creat: : 1.3 (04/27/2008)   Chol: 188 (04/27/2008)   HDL: 29.8 (04/27/2008)   LDL: 130 (04/27/2008)   TG: 139 (04/27/2008) stable overall by hx and exam, ok to continue meds/tx as is   Problem # 4:  FATIGUE (ICD-780.79) exam benign, to check labs below; follow with expectant management  Orders: T-Vitamin D (25-Hydroxy) [id number]) TLB-TSH (Thyroid Stimulating Hormone) (84443-TSH) TLB-Sedimentation Rate (ESR) (85652-ESR) TLB-IBC Pnl (Iron/FE;Transferrin) (83550-IBC) TLB-B12 + Folate Pnl [id number]) TLB-CBC Platelet - w/Differential (85025-CBCD) TLB-Udip ONLY (81003-UDIP)  Problem # 5:  HYPERLIPIDEMIA (ICD-272.4)  Orders: TLB-A1C / Hgb A1C (Glycohemoglobin) (83036-A1C) EKG w/ Interpretation (93000) TLB-BMP (Basic Metabolic Panel-BMET) (99991111) TLB-Lipid Panel (80061-LIPID) TLB-Microalbumin/Creat Ratio, Urine (82043-MALB)  Labs Reviewed: SGOT: 26 (04/27/2008)   SGPT: 28 (04/27/2008)   HDL:29.8 (04/27/2008), 36.6 (04/28/2007)  LDL:130 (04/27/2008), 117 (04/28/2007)  Chol:188 (04/27/2008), 187 (04/28/2007)  Trig:139 (04/27/2008), 165 (04/28/2007) stable overall by hx and exam, ok to continue meds/tx as is, Pt to continue diet efforts, good med tolerance; to check labs - goal LDL less than 70   Complete Medication List: 1)  Metformin Hcl 500 Mg Tb24 (Metformin hcl) .[name].. 1 by mouth in the am, 1 by mouth afternoon, take 2 by mouth in the evening 2)  Amlodipine Besylate 10 Mg Tabs (Amlodipine besylate) .[name].. 1 by mouth once daily 3)  Ecotrin Low Strength 81 Mg Tbec (Aspirin) .[name].. 1po qd 4)  Benazepril Hcl 40 Mg Tabs (Benazepril hcl) .[name].. 1 by mouth once daily 5)  Fish Oil 1000 Mg Caps (Omega-3 fatty acids) .... Take 1  by mouth two times a day 6)  Cinnamon 500 Mg Caps (Cinnamon) .... Take 1 by mouth two times a day 7)  Glucosamine-chondroitin Caps (Glucosamine-chondroit-vit c-mn) .... Take 1 by mouth once daily 8)  Multivitamins Caps (Multiple vitamin) .... Take 1 by mouth once daily 9)  Vit D  .... Take 1 by mouth once daily 10)  Odorless Garlic [id number] Mg Tabs (Garlic) .[name].. 1 by mouth two times a day 11)  Vitamin C 1000 Mg Tabs (Ascorbic acid) .[name].. 1 by mouth once daily 12)  Calcium 600 Mg Tabs (Calcium) .[name].. 1 by mouth once daily  13)  Omeprazole 20 Mg Cpdr (Omeprazole) .[name].. 1po once daily  Other Orders: Tdap => 56yrs IM [id number]) Admin 1st Vaccine [id number]) TLB-PSA (Prostate Specific Antigen) (84153-PSA)  Patient Instructions: 1)  you had the tetanus shot today 2)  Please go to the Lab in the basement for your blood and/or urine tests today 3)  Continue all previous medications as before this visit  4)  Please schedule a follow-up appointment in 6 months.   Immunizations Administered:  Tetanus Vaccine:    Vaccine Type: Tdap    Site: left deltoid    Mfr: GlaxoSmithKline    Dose: 0.5 ml    Route: IM    Given by: Robin Ewing    Exp. Date: 01/23/2011    Lot #: [id number]    VIS given: 01/26/07 version given May 17, 2009.

## 2010-04-11 NOTE — Consult Note (Signed)
Summary: [facility]  [facility]   Imported By: Rise Patience 01/10/2010 13:53:28  _____________________________________________________________________  External Attachment:    Type:   Image     Comment:   External Document

## 2010-04-11 NOTE — Progress Notes (Signed)
Summary: Referral  Phone Note Call from Patient Call back at Home Phone [phone]   Caller: Patient Call For: Dr. Loanne Drilling Summary of Call: The Patient called as was seen by Dr. Claiborne Rigg (Thyroid).  Dr. Claiborne Rigg thinks it may be cancerous and suggested the patient get a 2nd opinion. The patient would like a referral from Dr. Loanne Drilling to get a second opinion. The patient would like a call back once completed. Initial call taken by: Robin Ewing CMA Deborra Medina),  March 27, 2010 11:05 AM  Follow-up for Phone Call        sent Follow-up by: Donavan Foil MD,  March 27, 2010 11:26 AM  Additional Follow-up for Phone Call Additional follow up Details #1::        Pt informed and will expect a call from [facility] Additional Follow-up by: Crissie Sickles, [place],  March 27, 2010 2:40 PM

## 2010-04-11 NOTE — Progress Notes (Signed)
   Phone Note From Other Clinic   Caller: Denise/[facility] Details for Reason: Pt.Information Initial call taken by: Cleotis Lema to [place]  April 03, 2009 11:23 AM

## 2010-04-12 ENCOUNTER — Other Ambulatory Visit: Payer: Self-pay | Admitting: Internal Medicine

## 2010-04-12 DIAGNOSIS — R1013 Epigastric pain: Secondary | ICD-10-CM

## 2010-04-16 ENCOUNTER — Telehealth: Payer: Self-pay | Admitting: Endocrinology

## 2010-04-17 ENCOUNTER — Ambulatory Visit
Admission: RE | Admit: 2010-04-17 | Discharge: 2010-04-17 | Disposition: A | Discharge status: Home / Self Care | Payer: Medicare Other | Source: Ambulatory Visit | Attending: Internal Medicine | Admitting: Internal Medicine

## 2010-04-17 DIAGNOSIS — R1013 Epigastric pain: Secondary | ICD-10-CM

## 2010-04-17 NOTE — Progress Notes (Signed)
Summary: Biopsy results  Phone Note Call from Patient Call back at Home Phone [phone]   Caller: Patient Summary of Call: Pt called requesting SAE have results of biopsy expedited. Pt is anxious to have surgery done, please advise. Initial call taken by: Crissie Sickles, [place],  April 10, 2010 1:04 PM  Follow-up for Phone Call        chart says was done yesterday.  we'll know in the next few days. please call [phone] to hear your test results. Follow-up by: Donavan Foil MD,  April 11, 2010 8:07 AM  Additional Follow-up for Phone Call Additional follow up Details #1::        Pt advised of above via VM Additional Follow-up by: Crissie Sickles, [place],  April 11, 2010 9:05 AM

## 2010-04-17 NOTE — Assessment & Plan Note (Signed)
Summary: DISCUSS SURGERY/NWS   Vital Signs:  Patient profile:   70 year old male Height:      73 inches Weight:      241 pounds BMI:     31.91 O2 Sat:      95 % on Room air Temp:     98.0 degrees F oral Pulse rate:   73 / minute BP sitting:   140 / 72  (left arm) Cuff size:   regular  Vitals Entered By: Crissie Sickles, CMA (April 03, 2010 1:15 PM)  O2 Flow:  Room air CC: discuss upcoming surgery   Primary Provider:  Biagio Borg MD  CC:  discuss upcoming surgery.  History of Present Illness: pt says he does not notice the thyroid nodule, but the left neck nodule is unchanged.  we discussed the pros and cons of neck surgery.    Allergies: 1)  ! * Sulfer 2)  ! * Codiene 3)  ! Mevacor 4)  ! Atenolol 5)  ! Ace Inhibitors  Past History:  Past Medical History: Last updated: 02/07/2009 1. Diabetes mellitus, type II 2. Hypertension 3. R knee DJD, left knee DJD 4. Obesity 5. Chronic Bradycardia (asymptomatic) 6. Colonic polyps, hx of - adenomatous 7. Hyperlipidemia: patient has tried 3 different statins in the past with severe myalgias.  8. lumbar disc disease 9. Adenosine myoview (6/10): EF 63%, normal wall motion, normal perfusion.  Negative test.  GERD  Review of Systems       denies dysphagia  Physical Exam  General:  normal appearance.   Neck:  i do not appreciate the thyroid nodule, but the left lateral neck nodule is unchanged Additional Exam:  outside test results are reviewed:  neck ct:   The thyroid gland itself appears to contain multiple nodules,   probably a multinodular goiter with some calcified nodules.   However, in the isthmus, there is a 2.2 cm diameter dominant nodule   with more fleck-like calcification.  Because of that appearance, I   would suggest fine-needle aspiration of this lesion.   Calcified abnormality in the left neck seen at ultrasound   represented 1 of 3 peripherally calcified enlarged lymph nodes,   level II and  level III.  The peripheral calcification is almost   always an indication of benign nodal calcification and is usually   seen with either sarcoid or treated lymphoma.  Conceivably, old   tuberculosis could have this appearance as well.  In addition to   these calcified nodes, there are numerous 1 cm and smaller level II   and level III lymph nodes as well as lymph nodes lower in the neck   and in the upper mediastinum.  The largest paratracheal node on the   right measures 18 mm in diameter.  All of this appearance could be   explained by the diagnosis of sarcoid.  However, because of the   dominant thyroid nodule with calcification, I cannot completely   exclude the possibility of metastatic involvement of the nodes.   Impression & Recommendations:  Problem # 1:  NECK MASS (ICD-784.2) i strongly advised surgery  Other Orders: Radiology Referral (Radiology) Est. Patient Level III [id number])  Patient Instructions: 1)  check ultrasound-guided biopsy of the thyroid lump.  you will be called with a day and time for an appointment. 2)  a few days after the biopsy, please call [phone] to hear your test results. 3)  in my opinion, you should undergo the surgery, as  advised by dr shoemaker, no matter what the biopsy shows.  however, there is some value in knowing of a cancerous result (should it be cancer), prior to the surgery.   Orders Added: 1)  Radiology Referral [Radiology] 2)  Est. Patient Level III [id number]

## 2010-04-22 ENCOUNTER — Encounter: Payer: Self-pay | Admitting: Internal Medicine

## 2010-04-22 ENCOUNTER — Ambulatory Visit (INDEPENDENT_AMBULATORY_CARE_PROVIDER_SITE_OTHER): Payer: Medicare Other | Admitting: Internal Medicine

## 2010-04-22 DIAGNOSIS — R079 Chest pain, unspecified: Secondary | ICD-10-CM

## 2010-04-22 DIAGNOSIS — I10 Essential (primary) hypertension: Secondary | ICD-10-CM

## 2010-04-22 DIAGNOSIS — E119 Type 2 diabetes mellitus without complications: Secondary | ICD-10-CM

## 2010-04-22 DIAGNOSIS — K8020 Calculus of gallbladder without cholecystitis without obstruction: Secondary | ICD-10-CM | POA: Insufficient documentation

## 2010-04-22 DIAGNOSIS — R1013 Epigastric pain: Secondary | ICD-10-CM

## 2010-04-22 HISTORY — DX: Calculus of gallbladder without cholecystitis without obstruction: K80.20

## 2010-04-23 ENCOUNTER — Encounter: Payer: Self-pay | Admitting: Nurse Practitioner

## 2010-04-23 ENCOUNTER — Ambulatory Visit (INDEPENDENT_AMBULATORY_CARE_PROVIDER_SITE_OTHER): Payer: Medicare Other | Admitting: Nurse Practitioner

## 2010-04-23 ENCOUNTER — Telehealth (INDEPENDENT_AMBULATORY_CARE_PROVIDER_SITE_OTHER): Payer: Self-pay | Admitting: *Deleted

## 2010-04-23 ENCOUNTER — Encounter: Payer: Self-pay | Admitting: Internal Medicine

## 2010-04-23 DIAGNOSIS — R141 Gas pain: Secondary | ICD-10-CM

## 2010-04-23 DIAGNOSIS — K8020 Calculus of gallbladder without cholecystitis without obstruction: Secondary | ICD-10-CM

## 2010-04-23 DIAGNOSIS — R142 Eructation: Secondary | ICD-10-CM

## 2010-04-23 DIAGNOSIS — R143 Flatulence: Secondary | ICD-10-CM

## 2010-04-23 DIAGNOSIS — R1013 Epigastric pain: Secondary | ICD-10-CM

## 2010-04-23 HISTORY — DX: Gas pain: R14.1

## 2010-04-23 HISTORY — DX: Eructation: R14.3

## 2010-04-25 NOTE — Progress Notes (Signed)
Summary: Biospy results  Phone Note Call from Patient Call back at Home Phone [phone]   Caller: Patient Call For: Nathan Borg MD Summary of Call: Pt requests results of biospy, [phone]. Initial call taken by: Denice Paradise,  April 16, 2010 4:12 PM  Follow-up for Phone Call        pt informed of Biospy results and of MD's advisement to proceed with surgery as dicussed with [facility]E at [facility]. Pt verbalized understanding. Copy of report mailed to pt's address on file as pt requested.  Follow-up by: Rebeca Alert CMA Deborra Medina),  April 16, 2010 4:45 PM

## 2010-04-30 ENCOUNTER — Other Ambulatory Visit (AMBULATORY_SURGERY_CENTER): Payer: Medicare Other | Admitting: Internal Medicine

## 2010-04-30 ENCOUNTER — Other Ambulatory Visit: Payer: Self-pay | Admitting: Nurse Practitioner

## 2010-04-30 ENCOUNTER — Other Ambulatory Visit: Payer: Medicare Other

## 2010-04-30 ENCOUNTER — Other Ambulatory Visit: Payer: Self-pay | Admitting: Internal Medicine

## 2010-04-30 ENCOUNTER — Encounter: Payer: Self-pay | Admitting: Internal Medicine

## 2010-04-30 ENCOUNTER — Encounter (INDEPENDENT_AMBULATORY_CARE_PROVIDER_SITE_OTHER): Payer: Self-pay | Admitting: *Deleted

## 2010-04-30 DIAGNOSIS — K449 Diaphragmatic hernia without obstruction or gangrene: Secondary | ICD-10-CM

## 2010-04-30 DIAGNOSIS — K219 Gastro-esophageal reflux disease without esophagitis: Secondary | ICD-10-CM

## 2010-04-30 DIAGNOSIS — K209 Esophagitis, unspecified: Secondary | ICD-10-CM

## 2010-04-30 DIAGNOSIS — R933 Abnormal findings on diagnostic imaging of other parts of digestive tract: Secondary | ICD-10-CM

## 2010-04-30 DIAGNOSIS — R1013 Epigastric pain: Secondary | ICD-10-CM

## 2010-04-30 LAB — GLUCOSE, CAPILLARY: Glucose-Capillary: 129 mg/dL — ABNORMAL HIGH (ref 70–99)

## 2010-05-01 ENCOUNTER — Telehealth: Payer: Self-pay | Admitting: Endocrinology

## 2010-05-01 LAB — HEPATIC FUNCTION PANEL
Albumin: 4.2 g/dL (ref 3.5–5.2)
Alkaline Phosphatase: 54 U/L (ref 39–117)
Total Protein: 6.9 g/dL (ref 6.0–8.3)

## 2010-05-01 LAB — AMYLASE: Amylase: 88 U/L (ref 27–131)

## 2010-05-01 LAB — LIPASE: Lipase: 27 U/L (ref 11.0–59.0)
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
## 2010-05-01 NOTE — Letter (Signed)
Summary: Diabetic Instructions  [facility]  [place], [place] [address]   Phone: [phone]  Fax: [phone]    NOBORU MASOUD Jul 05, 1940 MRN: [id number]   X   ORAL DIABETIC MEDICATION INSTRUCTIONS  The day before your procedure:   Take your diabetic pill as you do normally  The day of your procedure:   Do not take your diabetic pill    We will check your blood sugar levels during the admission process and again in Recovery before discharging you home  ________________________________________________________________________  _  _   INSULIN (LONG ACTING) MEDICATION INSTRUCTIONS (Lantus, NPH, 70/30, Humulin, Novolin-N)   The day before your procedure:   Take  your regular evening dose    The day of your procedure:   Do not take your morning dose    _  _   INSULIN (SHORT ACTING) MEDICATION INSTRUCTIONS (Regular, Humulog, Novolog)   The day before your procedure:   Do not take your evening dose   The day of your procedure:   Do not take your morning dose   _  _   INSULIN PUMP MEDICATION INSTRUCTIONS  We will contact the physician managing your diabetic care for written dosage instructions for the day before your procedure and the day of your procedure.  Once we have received the instructions, we will contact you.

## 2010-05-01 NOTE — Letter (Signed)
Summary: EGD Instructions  [facility]  [place], [place] [address]   Phone: [phone]  Fax: [phone]       WISAM FAHNESTOCK    1940/07/24    MRN: [id number]       Procedure Day /Date:04-30-2010     Arrival Time: 2:00 PM      Procedure Time: 3:00 PM     Location of Procedure:                    X     [facility] [place] (4th Floor) PREPARATION FOR ENDOSCOPY   On 04-30-2010 THE DAY OF THE PROCEDURE:  1.   No solid foods, milk or milk products are allowed after midnight the night before your procedure.  2.   Do not drink anything colored red or purple.  Avoid juices with pulp.  No orange juice.  3.  You may drink clear liquids until 1:00 PM , which is 2 hours before your procedure.                                                                                                CLEAR LIQUIDS INCLUDE: Water Jello Ice Popsicles Tea (sugar ok, no milk/cream) Powdered fruit flavored drinks Coffee (sugar ok, no milk/cream) Gatorade Juice: apple, white grape, white cranberry  Lemonade Clear bullion, consomm, broth Carbonated beverages (any kind) Strained chicken noodle soup Hard Candy   MEDICATION INSTRUCTIONS  Unless otherwise instructed, you should take regular prescription medications with a small sip of water as early as possible the morning of your procedure.  Diabetic patients - see separate instructions.        OTHER INSTRUCTIONS  You will need a responsible adult at least 72 years of age to accompany you and drive you home.   This person must remain in the waiting room during your procedure.  Wear loose fitting clothing that is easily removed.  Leave jewelry and other valuables at home.  However, you may wish to bring a book to read or an iPod/MP3 player to listen to music as you wait for your procedure to start.  Remove all body piercing jewelry and leave at home.  Total time from sign-in until discharge is approximately 2-3  hours.  You should go home directly after your procedure and rest.  You can resume normal activities the day after your procedure.  The day of your procedure you should not:   Drive   Make legal decisions   Operate machinery   Drink alcohol   Return to work  You will receive specific instructions about eating, activities and medications before you leave.    The above instructions have been reviewed and explained to me by   _______________________    I fully understand and can verbalize these instructions _____________________________ Date _________

## 2010-05-01 NOTE — Letter (Signed)
Summary: Harl Bowie MD  Harl Bowie MD   Imported By: Bubba Hales 04/24/2010 09:51:40  _____________________________________________________________________  External Attachment:    Type:   Image     Comment:   External Document
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
## 2010-05-01 NOTE — Progress Notes (Signed)
Summary: ASAP APPT   Spoke to pt and he would like to come today I advised her would be seeing the NP and he says that is fine. Appt today at 10:30am  ---- Converted from flag ---- ---- 04/23/2010 8:59 AM, Bernita Buffy CMA (AAMA) wrote: this is a Dr. Olevia Perches pt, but she has no open slots and I have Left a message on patients machine to call back. to get an appt with Amy or Nevin Bloodgood  ---- 04/23/2010 8:28 AM, Sable Feil MD [facility] wrote: make ov this week  ---- 04/22/2010 9:38 PM, Biagio Borg MD wrote: Dr Sharlett Iles, this pt has persistent epigstric pain on two times a day PPI, and quite anxious to get looked into asap (this wk if possible), as he very likely has thyroid cancer and is planning surgury soon;  can he be seen this wk? or different GI? ------------------------------

## 2010-05-06 ENCOUNTER — Encounter: Payer: Self-pay | Admitting: Internal Medicine

## 2010-05-07 NOTE — Procedures (Addendum)
Summary: Upper Endoscopy  Patient: Nathan Russo Note: All result statuses are Final unless otherwise noted.  Tests: (1) Upper Endoscopy (EGD)   EGD Upper Endoscopy       [facility]     [place], [place]  [address]           ENDOSCOPY PROCEDURE REPORT           PATIENT:  Nathan Russo, Nathan Russo  MR#:  [id number]     BIRTHDATE:  August 23, 1940, 4 yrs. old  GENDER:  male           ENDOSCOPIST:  Lowella Bandy. Olevia Perches, MD     Referred by:  Cathlean Cower, M.D.           PROCEDURE DATE:  04/30/2010     PROCEDURE:  EGD with biopsy, [address]     ASA CLASS:  Class III     INDICATIONS:  GERD, heartburn hx of asymptomatic cholelithiasis,     new onset of heartburnx 3 weeks, refractory to PPI           MEDICATIONS:   Versed 5 mg, Fentanyl 50 mcg     TOPICAL ANESTHETIC:  Exactacain Spray           DESCRIPTION OF PROCEDURE:   After the risks benefits and     alternatives of the procedure were thoroughly explained, informed     consent was obtained.  The LB [id number]-H180 [id number] endoscope was     introduced through the mouth and advanced to the second portion of     the duodenum, without limitations.  The instrument was slowly     withdrawn as the mucosa was fully examined.     <<PROCEDUREIMAGES>>           A hiatal hernia was found (see image1). 3 cm hh 35-38cm, partly     reducible  irregular Z-line. With standard forceps, a biopsy was     obtained and sent to pathology (see image2 and image6). r/o     barrett's  Otherwise the examination was normal (see image5,     image4, and image3).    Retroflexed views revealed no     abnormalities.    The scope was then withdrawn from the patient     and the procedure completed.           COMPLICATIONS:  None           ENDOSCOPIC IMPRESSION:     1) Hiatal hernia     2) Irregular Z-line     3) Otherwise normal examination     suspest GERD due to decreased LES     RECOMMENDATIONS:     1) Anti-reflux regimen to be follow     switch to  samples of Nexiem 40 mh, #15, in place of Prelosec,     may take prelosec in addition to Nexiem, also add Carafate 1g po     bid,#20           REPEAT EXAM:  In 0 year(s) for.           ______________________________     Lowella Bandy. Olevia Perches, MD           CC:           n.     eSIGNED:   Lowella Bandy. Brodie at 04/30/2010 03:29 PM           [name], [id number]  Note: An exclamation mark (!) indicates a result that was not dispersed into the flowsheet. Document Creation Date: 04/30/2010 3:29 PM _______________________________________________________________________  (1) Order result status: Final Collection or observation date-time: 04/30/2010 15:17 Requested date-time:  Receipt date-time:  Reported date-time:  Referring Physician:   Ordering Physician: Delfin Edis [phone]) Specimen Source:  Source: Tawanna Cooler Order Number: [id number] Lab site:

## 2010-05-07 NOTE — Progress Notes (Signed)
Summary: Surgery referral  Phone Note Call from Patient Call back at Home Phone [phone] [facility]     Caller: Patient Summary of Call: Pt called stating he has had two consultation with surgeons and both have advised that his neck nodules and thyroid cannot be removed at the same time. Pt is requesting MD advise and refer him to a surgoen that can perform both sugeries at the same time.   Initial call taken by: Crissie Sickles, [place],  May 01, 2010 1:19 PM  Follow-up for Phone Call        i left messsage for dr shoemaker to call me back Follow-up by: Donavan Foil MD,  May 02, 2010 11:41 AM  Additional Follow-up for Phone Call Additional follow up Details #1::        i spoke to dr shoemaker, and to pt.  i told pt: you need thyroidect and left neck dissection, but you do not need surgical rx of the mediastinal lymphadenopathy now.  pt says he'll contact dr Wilburn Cornelia to schedule surgery. Additional Follow-up by: Donavan Foil MD,  May 03, 2010 9:45 AM

## 2010-05-07 NOTE — Miscellaneous (Signed)
Summary: Carafate and nexium prescription  Clinical Lists Changes  Medications: Added new medication of CARAFATE 1 GM  TABS (SUCRALFATE) one by mouth two times a day - Signed Rx of CARAFATE 1 GM  TABS (SUCRALFATE) one by mouth two times a day;  #20 x 0;  Signed;  Entered by: Joylene John RN;  Authorized by: Lafayette Dragon MD;  Method used: Print then Give to Patient    Prescriptions: CARAFATE 1 GM  TABS (SUCRALFATE) one by mouth two times a day  #20 x 0   Entered by:   Joylene John RN   Authorized by:   Lafayette Dragon MD   Signed by:   Joylene John RN on 04/30/2010   Method used:   Print then Give to Patient   RxID:   [id number]

## 2010-05-16 NOTE — Letter (Signed)
Summary: Patient [facility] Biopsy Results  [facility]  [place], [place] [address]   Phone: [phone]  Fax: [phone]        May 06, 2010 MRN: [id number]    [facility] [address] [place], [place]  [address]    Dear Mr. Delk,  I am pleased to inform you that the biopsies taken during your recent endoscopic examination did not show any evidence of cancer upon pathologic examination.  Additional information/recommendations:  __No further action is needed at this time.  Please follow-up with      your primary care physician for your other healthcare needs.  __ Please call [phone] to schedule a return visit to review      your condition.  _x_ Continue with the treatment plan as outlined on the day of your      exam.  _   Please call us if you are having persistent problems or have questions about your condition that have not been fully answered at this time.  Sincerely,  Lafayette Dragon MD  This letter has been electronically signed by your physician.  Appended Document: Patient Notice-Endo Biopsy Results letter mailed

## 2010-05-20 LAB — GLUCOSE, CAPILLARY: Glucose-Capillary: 289 mg/dL — ABNORMAL HIGH (ref 70–99)

## 2010-05-21 ENCOUNTER — Encounter: Payer: Self-pay | Admitting: Endocrinology

## 2010-05-24 ENCOUNTER — Telehealth: Payer: Self-pay | Admitting: Cardiology

## 2010-05-27 LAB — POCT I-STAT 4, (NA,K, GLUC, HGB,HCT): Glucose, Bld: 165 mg/dL — ABNORMAL HIGH (ref 70–99)

## 2010-05-28 NOTE — Progress Notes (Signed)
Summary: Pain in left arm leading to hand   Phone Note Call from Patient Call back at Home Phone [phone]   Caller: Patient Summary of Call: Pt having pain in left arm lower part leading to hand. Initial call taken by: Delsa Sale,  May 24, 2010 2:37 PM  Follow-up for Phone Call        pt just home from the hospital following  thyroid surgery, is having pain in left arm from elbow to wrist, no chest pain or SOB, he did have an IV in that arm, advised didn't sound cardiac to cont. to monitor and f/u w/pcp if not getting better Kevan Rosebush, RN  May 24, 2010 2:50 PM

## 2010-06-10 ENCOUNTER — Encounter: Payer: Self-pay | Admitting: Internal Medicine

## 2010-06-13 ENCOUNTER — Other Ambulatory Visit (INDEPENDENT_AMBULATORY_CARE_PROVIDER_SITE_OTHER): Payer: Medicare Other

## 2010-06-13 ENCOUNTER — Encounter: Payer: Self-pay | Admitting: Internal Medicine

## 2010-06-13 ENCOUNTER — Ambulatory Visit (INDEPENDENT_AMBULATORY_CARE_PROVIDER_SITE_OTHER): Payer: Medicare Other | Admitting: Internal Medicine

## 2010-06-13 ENCOUNTER — Other Ambulatory Visit: Payer: Self-pay | Admitting: Internal Medicine

## 2010-06-13 VITALS — BP 118/68 | HR 53 | Temp 97.9°F | Ht 73.0 in | Wt 238.1 lb

## 2010-06-13 DIAGNOSIS — R972 Elevated prostate specific antigen [PSA]: Secondary | ICD-10-CM

## 2010-06-13 DIAGNOSIS — E119 Type 2 diabetes mellitus without complications: Secondary | ICD-10-CM

## 2010-06-13 DIAGNOSIS — N320 Bladder-neck obstruction: Secondary | ICD-10-CM

## 2010-06-13 DIAGNOSIS — I10 Essential (primary) hypertension: Secondary | ICD-10-CM

## 2010-06-13 DIAGNOSIS — C73 Malignant neoplasm of thyroid gland: Secondary | ICD-10-CM

## 2010-06-13 DIAGNOSIS — R5383 Other fatigue: Secondary | ICD-10-CM

## 2010-06-13 DIAGNOSIS — E785 Hyperlipidemia, unspecified: Secondary | ICD-10-CM

## 2010-06-13 DIAGNOSIS — R5381 Other malaise: Secondary | ICD-10-CM

## 2010-06-13 DIAGNOSIS — K8020 Calculus of gallbladder without cholecystitis without obstruction: Secondary | ICD-10-CM | POA: Insufficient documentation

## 2010-06-13 HISTORY — DX: Elevated prostate specific antigen (PSA): R97.20

## 2010-06-13 HISTORY — DX: Malignant neoplasm of thyroid gland: C73

## 2010-06-13 LAB — URINALYSIS, ROUTINE W REFLEX MICROSCOPIC
Nitrite: NEGATIVE
Specific Gravity, Urine: 1.02 (ref 1.000–1.030)
Urine Glucose: NEGATIVE
Urobilinogen, UA: 0.2 (ref 0.0–1.0)

## 2010-06-13 LAB — HEPATIC FUNCTION PANEL
Alkaline Phosphatase: 54 U/L (ref 39–117)
Bilirubin, Direct: 0.2 mg/dL (ref 0.0–0.3)
Total Bilirubin: 1.2 mg/dL (ref 0.3–1.2)

## 2010-06-13 LAB — BASIC METABOLIC PANEL
BUN: 21 mg/dL (ref 6–23)
CO2: 28 mEq/L (ref 19–32)
Chloride: 104 mEq/L (ref 96–112)
Creatinine, Ser: 1.4 mg/dL (ref 0.4–1.5)
Glucose, Bld: 106 mg/dL — ABNORMAL HIGH (ref 70–99)
Potassium: 4.6 mEq/L (ref 3.5–5.1)

## 2010-06-13 LAB — CBC WITH DIFFERENTIAL/PLATELET
Basophils Absolute: 0.1 10*3/uL (ref 0.0–0.1)
Basophils Relative: 1 % (ref 0.0–3.0)
Eosinophils Absolute: 0.2 10*3/uL (ref 0.0–0.7)
Lymphocytes Relative: 24.7 % (ref 12.0–46.0)
MCHC: 34.3 g/dL (ref 30.0–36.0)
Monocytes Absolute: 0.3 10*3/uL (ref 0.1–1.0)
Neutrophils Relative %: 64.5 % (ref 43.0–77.0)
Platelets: 353 10*3/uL (ref 150.0–400.0)
RBC: 4.82 Mil/uL (ref 4.22–5.81)
RDW: 14.1 % (ref 11.5–14.6)

## 2010-06-13 LAB — PSA: PSA: 4.42 ng/mL — ABNORMAL HIGH (ref 0.10–4.00)

## 2010-06-13 LAB — MICROALBUMIN / CREATININE URINE RATIO: Microalb Creat Ratio: 1 mg/g (ref 0.0–30.0)

## 2010-06-13 NOTE — Assessment & Plan Note (Signed)
stable overall by hx and exam, most recent lab reviewed with pt, and pt to continue medical treatment as before  Lab Results  Component Value Date   WBC 6.5 05/17/2009   HGB 15.1 05/17/2009   HGB NEGATIVE 05/17/2009   HCT 45.7 05/17/2009   PLT 271.0 05/17/2009   CHOL 163 12/13/2009   TRIG 129.0 12/13/2009   HDL 30.10* 12/13/2009   ALT 33 04/30/2010   AST 27 04/30/2010   NA 136 12/13/2009   K 4.0 12/13/2009   CL 102 12/13/2009   CREATININE 1.3 12/13/2009   BUN 25* 12/13/2009   CO2 26 12/13/2009   TSH 1.14 05/17/2009   PSA 1.91 05/17/2009   HGBA1C 7.0* 12/13/2009   MICROALBUR 0.9 05/17/2009

## 2010-06-13 NOTE — Assessment & Plan Note (Signed)
stable overall by hx and exam, most recent lab reviewed with pt, and pt to continue medical treatment as before  Lab Results  Component Value Date   LDLCALC 107* 12/13/2009

## 2010-06-13 NOTE — Patient Instructions (Signed)
We made copies of your information today for your chart Continue all other medications as before Please go to LAB in the Basement for the blood and/or urine tests to be done today Please call the number on the [place] (the [place]) for results of testing in 2-3 days Good Luck with your thyroid treatment coming up We can take over the thyroid testing as needed Please return in 6 months

## 2010-06-13 NOTE — Progress Notes (Signed)
Subjective:    Patient ID: Nathan Russo, male    DOB: 1941/01/14, 70 y.o.   MRN: [id number]  HPI Here for f/u;  Overall doing ok;  Pt denies CP, worsening SOB, DOE, wheezing, orthopnea, PND, worsening LE edema, palpitations, dizziness or syncope.  Pt denies neurological change such as new Headache, facial or extremity weakness.  Pt denies polydipsia, polyuria, or low sugar symptoms. Pt states overall good compliance with treatment and medications, good tolerability, and trying to follow lower cholesterol diet.  Pt denies worsening depressive symptoms, suicidal ideation or panic. No fever, wt loss, night sweats, loss of appetite, or other constitutional symptoms.  Pt states good ability with ADL's, low fall risk, home safety reviewed and adequate, no significant changes in hearing or vision, and occasionally active with exercise.  Wto down intentionally over 10 lbs after recent thyroid cancer surgury.  CBG's in the low 100's.  Does have sense of ongoing fatigue, but denies signficant hypersomnolence.  Past Medical History  Diagnosis Date  . THYROID NODULE 02/07/2010  . DIABETES MELLITUS, TYPE II 04/28/2007  . HYPERLIPIDEMIA 04/28/2007  . OBESITY 10/24/2006  . CARPAL TUNNEL SYNDROME, BILATERAL 02/07/2009  . OTITIS MEDIA, ACUTE, LEFT 12/26/2009  . HYPERTENSION 10/21/2006  . BRADYCARDIA, CHRONIC 10/24/2006  . GERD 02/11/2009  . PHIMOSIS 02/07/2009  . DEGENERATIVE JOINT DISEASE, RIGHT KNEE 10/24/2006  . Dizziness and giddiness 12/19/2009  . FATIGUE 04/28/2007  . Headache 12/19/2009  . NECK MASS 02/07/2010  . CHEST PAIN-UNSPECIFIED 09/05/2008  . COLONIC POLYPS, HX OF 04/28/2007  . Abdominal pain, epigastric 04/11/2010  . CHOLELITHIASIS 04/22/2010  . BELCHING 04/23/2010  . Thyroid cancer 06/13/2010   Past Surgical History  Procedure Date  . Left knee surgery   . Right wrist surgury     reports that he has quit smoking. He does not have any smokeless tobacco history on file. He reports that he does not drink  alcohol or use illicit drugs. family history includes Arthritis in his mother; Cancer in his brother and sister; Diabetes in his brother; Goiter in his mother; Heart attack (age of onset:77) in his brother; Heart attack (age of onset:88) in his father; Hypertension in his mother; and Hypothyroidism in his sister. Allergies  Allergen Reactions  . Ace Inhibitors     REACTION: cough  . Atenolol     REACTION: bradycardia  . Codeine   . Lovastatin     REACTION: myalygros   Current Outpatient Prescriptions on File Prior to Visit  Medication Sig Dispense Refill  . amLODipine (NORVASC) 10 MG tablet Take 10 mg by mouth daily.        . Ascorbic Acid (VITAMIN C) 1000 MG tablet Take 1,000 mg by mouth daily.        [name] aspirin 81 MG EC tablet Take 81 mg by mouth daily.        . benazepril (LOTENSIN) 40 MG tablet Take 40 mg by mouth daily.        . Blood Glucose Monitoring Suppl (ACCU-CHEK COMPACT CARE KIT) KIT by Does not apply route.        . calcium carbonate (OS-CAL) 600 MG TABS Take 600 mg by mouth daily.        . Cinnamon 500 MG capsule Take 500 mg by mouth 2 (two) times daily.        . Garlic Oil (ODORLESS GARLIC) [id number] MG TABS Take by mouth 2 (two) times daily.        . Glucosamine-Chondroit-Vit C-Mn (GLUCOSAMINE CHONDROITIN  COMPLX) CAPS Take by mouth daily.        . Glucose Blood (ACCU-CHEK INSTANT GLUCOSE TEST VI) by In Vitro route 4 (four) times daily.        [name] HYDROcodone-acetaminophen (NORCO) 5-325 MG per tablet Take 1 tablet by mouth. Per ortho       . meclizine (ANTIVERT) 12.5 MG tablet Take 12.5 mg by mouth every 6 (six) hours as needed.        . metFORMIN (GLUMETZA) 500 MG (MOD) 24 hr tablet Take 500 mg by mouth. 1 by mouth in the AM, 1 by mouth afternoon, take 2 by mouth in the evening       . Multiple Vitamin (MULTIVITAMIN) capsule Take 1 capsule by mouth daily.        . Omega-3 Fatty Acids (FISH OIL) 1000 MG CAPS Take by mouth 2 (two) times daily.        [name] omeprazole (PRILOSEC) 20 MG  capsule Take 20 mg by mouth daily.        [name] scopolamine (TRANSDERM-SCOP) 1.5 MG Place 1 patch onto the skin every third day.        [name] VITAMIN D, CHOLECALCIFEROL, PO Take by mouth daily.        . cephALEXin (KEFLEX) 500 MG capsule Take 500 mg by mouth 3 (three) times daily.        [name] HYDROcodone-homatropine (HYCODAN) 5-1.5 MG/5ML syrup Take by mouth every 6 (six) hours as needed.        [name] levofloxacin (LEVAQUIN) 250 MG tablet Take 250 mg by mouth daily.         Review of Systems Review of Systems  Constitutional: Negative for diaphoresis and unexpected weight change.  HENT: Negative for drooling and tinnitus.   Eyes: Negative for photophobia and visual disturbance.  Respiratory: Negative for choking and stridor.   Gastrointestinal: Negative for vomiting and blood in stool.  Genitourinary: Negative for hematuria and decreased urine volume.  Musculoskeletal: Negative for gait problem.  Skin: Negative for color change and wound.  Neurological: Negative for tremors and numbness.  Psychiatric/Behavioral: Negative for decreased concentration. The patient is not hyperactive.       Objective:   Physical ExamBP 118/68  Pulse 53  Temp(Src) 97.9 F (36.6 C) (Oral)  Ht 6\' 1"  (1.854 m)  Wt 238 lb 2 oz (108.013 kg)  BMI 31.42 kg/m2  SpO2 95%  Physical Exam  VS noted Constitutional: Pt appears well-developed and well-nourished.  HENT: Head: Normocephalic.  Right Ear: External ear normal.  Left Ear: External ear normal.  Eyes: Conjunctivae and EOM are normal. Pupils are equal, round, and reactive to light.  Neck: Normal range of motion. Neck supple.  Cardiovascular: Normal rate and regular rhythm.   Pulmonary/Chest: Effort normal and breath sounds normal.  Abd:  Soft, NT, non-distended, + BS Neurological: Pt is alert. No cranial nerve deficit.  Skin: Skin is warm. No erythema.  Scar well healing to left neck, no masses, tender, erythema Psychiatric: Pt behavior is normal. Thought content  normal.          Assessment & Plan:

## 2010-06-13 NOTE — Assessment & Plan Note (Signed)
stable overall by hx and exam, most recent lab reviewed with pt, and pt to continue medical treatment as before  Lab Results  Component Value Date   HGBA1C 7.0* 12/13/2009

## 2010-06-13 NOTE — Assessment & Plan Note (Signed)
Etiology unclear, Exam otherwise benign, to check labs as documented, follow with expectant management  

## 2010-06-13 NOTE — Assessment & Plan Note (Signed)
S/p surgury mar 14, with papillary Ca, due for Rad Iodine ablation tx apr 20 - to [facility] as planned

## 2010-06-17 ENCOUNTER — Telehealth: Payer: Self-pay | Admitting: Internal Medicine

## 2010-06-17 LAB — POCT I-STAT, CHEM 8
HCT: 41 % (ref 39.0–52.0)
Hemoglobin: 13.9 g/dL (ref 13.0–17.0)
Potassium: 4.3 mEq/L (ref 3.5–5.1)
Sodium: 141 mEq/L (ref 135–145)
TCO2: 22 mmol/L (ref 0–100)

## 2010-06-17 LAB — TSH: TSH: 1.766 u[IU]/mL (ref 0.350–4.500)

## 2010-06-17 LAB — GLUCOSE, CAPILLARY
Glucose-Capillary: 107 mg/dL — ABNORMAL HIGH (ref 70–99)
Glucose-Capillary: 113 mg/dL — ABNORMAL HIGH (ref 70–99)

## 2010-06-17 LAB — DIFFERENTIAL
Basophils Absolute: 0 10*3/uL (ref 0.0–0.1)
Eosinophils Relative: 3 % (ref 0–5)
Lymphocytes Relative: 23 % (ref 12–46)
Lymphs Abs: 1.6 10*3/uL (ref 0.7–4.0)
Neutro Abs: 4.4 10*3/uL (ref 1.7–7.7)

## 2010-06-17 LAB — BASIC METABOLIC PANEL
BUN: 20 mg/dL (ref 6–23)
Calcium: 9 mg/dL (ref 8.4–10.5)
GFR calc non Af Amer: >60 mL/min (ref >60)
Glucose, Bld: 108 mg/dL — ABNORMAL HIGH (ref 70–99)
Potassium: 4 mEq/L (ref 3.5–5.1)
Sodium: 143 mEq/L (ref 135–145)

## 2010-06-17 LAB — CBC
HCT: 42.4 % (ref 39.0–52.0)
Platelets: 256 10*3/uL (ref 150–400)
RDW: 13.5 % (ref 11.5–15.5)
WBC: 6.9 10*3/uL (ref 4.0–10.5)

## 2010-06-17 LAB — CARDIAC PANEL(CRET KIN+CKTOT+MB+TROPI)
CK, MB: 1 ng/mL (ref 0.3–4.0)
Total CK: 79 U/L (ref 7–232)
Troponin I: 0.01 ng/mL (ref 0.00–0.06)

## 2010-06-17 LAB — POCT CARDIAC MARKERS
CKMB, poc: <1 ng/mL — ABNORMAL LOW (ref 1.0–8.0)
Myoglobin, poc: 46.6 ng/mL (ref 12–200)
Myoglobin, poc: 48.1 ng/mL (ref 12–200)

## 2010-06-17 LAB — PROTIME-INR
INR: 1 (ref 0.00–1.49)
Prothrombin Time: 13.9 seconds (ref 11.6–15.2)

## 2010-06-17 LAB — APTT: aPTT: 31 seconds (ref 24–37)

## 2010-06-17 LAB — LIPID PANEL
HDL: 26 mg/dL — ABNORMAL LOW (ref >39)
Triglycerides: 108 mg/dL (ref <150)
VLDL: 22 mg/dL (ref 0–40)

## 2010-06-17 LAB — CK TOTAL AND CKMB (NOT AT ARMC)
CK, MB: 1 ng/mL (ref 0.3–4.0)
Relative Index: INVALID (ref 0.0–2.5)
Total CK: 74 U/L (ref 7–232)

## 2010-06-17 LAB — TROPONIN I: Troponin I: 0.01 ng/mL (ref 0.00–0.06)

## 2010-06-18 ENCOUNTER — Encounter: Payer: Self-pay | Admitting: Internal Medicine

## 2010-07-23 NOTE — Discharge Summary (Signed)
Nathan Russo, Nathan Russo                 ACCOUNT NO.:  [id number]   MEDICAL RECORD NO.:  [id number]          PATIENT TYPE:  INP   LOCATION:  [id number]                         FACILITY:  [facility]   PHYSICIAN:  Loralie Champagne, MD      DATE OF BIRTH:  05-22-1940   DATE OF ADMISSION:  08/22/2008  DATE OF DISCHARGE:  08/23/2008                               DISCHARGE SUMMARY   PROCEDURES:  Chest x-ray.   PRIMARY FINAL DISCHARGE DIAGNOSIS:  Chest pain.   SECONDARY DIAGNOSES:  1. Diabetes.  2. Hypertension.  3. Dyslipidemia with total cholesterol 161, triglycerides 108, HDL 26,      and LDL 113.  4. Obesity with the body mass index of 31.3.  5. Osteoarthritis, especially in his knees.  6. Chronic bradycardia with the heart rate in the 50s and occasionally      in the 40s while asleep, not on any rate-lowering medications.  7. History of colon polyps.  8. Lumbar disk disease.  23. Family history of coronary artery disease in his father and brother      at greater than age 76.   TIME OF DISCHARGE:  Thirty-three minutes.   HOSPITAL COURSE:  Mr. Passero is a 70 year old male with no history of  coronary artery disease.  He has multiple cardiac risk factors and a  chest pain.  He was admitted for further evaluation and treatment.   His chest pain resolved.  His chest x-ray showed some mild bibasilar  atelectases and possible scarring, which Mr. Grassl feels is from  multiple episodes of pneumonia he had as a child.  A lipid profile as  described above, and for now, diet and exercise therapy is indicated  with medical therapy if this does not work.  He is also encouraged to  increase his activity.  Dr. Aundra Dubin felt that if his cardiac enzymes were  negative, he could be safetely discharged home and have an outpatient  Myoview.  His cardiac enzymes are negative, and his symptoms have  resolved.  Mr. Chynoweth is, therefore, considered stable for discharge with  outpatient stress testing and followup.   DISCHARGE INSTRUCTIONS:  1. His activity level is to be increased gradually.  2. He is to stick to a low-sodium, heart-healthy diabetic diet.  3. He is to follow up with [name] as needed and with Dr. Aundra Dubin on      September 05, 2008, at noon.  4. He is to get a stress test at [facility] on August 31, 2008,      at 8:45.   DISCHARGE MEDICATIONS:  1. Aspirin 81 mg daily.  2. Metformin 500 mg morning and night with a 1000 mg at lunch.  3. Amlodipine 10 mg a day.  4. Benazepril 40 mg a day.  5. Glucosamine 2 tabs daily.  6. Fish oil 2 tabs daily.  7. Cinnamon, garlic, and calcium with vitamin D daily.      Rosaria Ferries, PA-C      Loralie Champagne, MD  Electronically Signed    RB/MEDQ  D:  08/23/2008  T:  08/23/2008  Job:  [id number]   cc:   Biagio Borg, MD

## 2010-07-23 NOTE — H&P (Signed)
NAMESUHAIB, Nathan Russo                 ACCOUNT NO.:  [id number]   MEDICAL RECORD NO.:  [id number]          PATIENT TYPE:  INP   LOCATION:  3703                         FACILITY:  [facility]   PHYSICIAN:  Loralie Champagne, MD      DATE OF BIRTH:  [birth year]   DATE OF ADMISSION:  08/22/2008  DATE OF DISCHARGE:  08/23/2008                              HISTORY & PHYSICAL   PRIMARY CARE PHYSICIAN:  Biagio Borg, MD   HISTORY OF PRESENT ILLNESS:  This is a 70 year old with history of  diabetes, hypertension, hyperlipidemia who presents with episodes of  chest pain.  The patient has had chest tightness episodes since last  Thursday.  He noticed them mostly at night when he lies on his stomach.  He wakes up with chest tightness across upper chest to the left  shoulder.  He is not sure how long it lasts.  Occasionally, he has the  same pain during the day tends to be mild and is unrelated to exertion.  He noted that the last time he noted it most recently this morning while  riding lawn mower, he had the same mild upper chest tightness radiating  to the left shoulder, it eventually resolved, he is not sure what caused  it to resolve.  The patient also reports having no dyspnea on exertion  in general, however, the last 2 times he has used the weed-eater,  he  has noted some shortness of breath and has had to stop before he  completely finished.  He had no chest pain, however with the weed-  eating.  Of note on Wednesday the day before all this began the patient  been using a chain saw and a sledgehammer to tear out a stump.  He had  been working fairly vigorously.  He had had no chest pain with this.   ALLERGIES:  The patient has had myalgias with STATIN, he is not sure  which one it was.   MEDICATIONS:  1. Metformin 1000 mg b.i.d.  2. Amlodipine 10 mg daily.  3. Aspirin 81 mg daily.  4. Benazepril 40 mg daily.   PAST MEDICAL HISTORY:  1. Diabetes.  2. Hypertension.  3. Osteoarthritis in  knees.  4. Obesity.  5. Chronic bradycardia.  The patient's heart rate got very low on beta-      blocker.  6. Colonic polyps.  7. Hyperlipidemia.  8. Lumbar disk disease.   SOCIAL HISTORY:  The patient is in [place].  He is married, he has 3  children.  He is a retired [employer] manager.  He is a former smoker and no longer  smoking now.  No alcohol or drugs.   FAMILY HISTORY:  Mother with hypertension.  Father with an MI at 64.  Brother with an MI at 63.   REVIEW OF SYSTEMS:  All systems are reviewed and were negative except as  noted in the history of present illness.   RADIOLOGY:  Chest x-ray showed mild bibasilar atelectasis.  EKG shows  normal sinus rhythm.  There is a left axis deviation, poor anterior R-  wave progression.   LABORATORY DATA:  The white count is 6.9, hematocrit 42.4, platelets  256, potassium 4.3, creatinine 1.4, glucose 129.  First set of cardiac  enzymes is negative.   PHYSICAL EXAMINATION:  VITAL SIGNS:  Temperature 98.8, pulse 62, blood  pressure is 142/74 initially, now is a 117/80, oxygen saturation 90% on  room air.  GENERAL:  This is an obese male in no apparent distress.  HEENT:  Normal exam.  ABDOMEN:  Soft, nontender.  No hepatosplenomegaly.  Normal bowel sounds.  NECK:  Supple without lymphadenopathy, no thyromegaly.  No thyroid  nodule.  There is no JVD.  CARDIOVASCULAR:  Heart regular S1 and S2.  No S3.  No S4.  There is 1/6  systolic ejection murmur at the right upper sternal border.  There is no  edema.  There is no carotid bruit.  There are 2+ posterior tibial pulses  bilaterally.  LUNGS:  Clear to auscultation bilaterally with normal respiratory  effort.  SKIN:  Normal exam.  MUSCULOSKELETAL:  Normal exam.  NEUROLOGIC:  Alert, oriented x3, normal affect.   ASSESSMENT AND PLAN:  This is a 70 year old with history of diabetes,  hypertension, hyperlipidemia who presented with atypical chest pain.  The pain is nonexertional.  It is possible  that it may be secondary to  muscle strain that he developed after tearing out a stump last  Wednesday.  We will plan on ruling out MI with cardiac enzymes.  The  patient does rule out.  We probably obtained an outpatient Myoview.  We  will continue his home medications including aspirin.  We will check his  lipids and finally of note his EKG shows no acute changes.      Loralie Champagne, MD  Electronically Signed     DM/MEDQ  D:  08/22/2008  T:  08/23/2008  Job:  [id number]

## 2010-07-26 NOTE — Assessment & Plan Note (Signed)
[place]                                   ON-CALL NOTE   NAME:ELLISAbdulrahman, Pinheiro                    MRN:          [id number]  DATE:11/23/2005                            DOB:          1940-03-28    Stating he was diagnosed with shingles last week.  He was away on revival.  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  He went to a  different doctor's office because of a rash that he broke out with, and he  was diagnosed with shingles.  He was put on Lyrica 75 mg twice a day.  He  has Darvocet and Valtrex x10 days, Lidoderm patches as well.  The patient  was asking if there was anything more he could do.  He has not taken any  Darvocet yet, so I recommended he try that, but to try it at night or a time  of day when he did not have to preach or travel.  He is to follow up with  [name] when he gets back next week.                                   Rosalita Chessman, DO   YRL/MedQ  DD:  11/23/2005  DT:  11/24/2005  Job #:  [id number]   cc:   Biagio Borg, MD

## 2010-07-26 NOTE — Assessment & Plan Note (Signed)
[place]                                 ON-CALL NOTE   NAME:ELLISAyman, Vicary                        MRN:          [id number]  DATE:03/26/2006                            DOB:          1941-02-03    PRIMARY CARE Karthika Glasper:  Biagio Borg, M.D.   HOME OFFICE:  [facility]   TIME:  9:06 p.m.   PHONE NUMBER:  [phone]   OBJECTIVE:  The patient is having problems with his blood pressure.  Had  been on lisinopril with good results of his blood pressure, but had a  cough, was changed to Atenolol, which did not control him well and his  heart rate went down to 40, so he returned and was seen a few days ago  and was changed to Diovan, was given samples of 160 mg and told to take  a half tablet a day.  He has been taking it three days now and his blood  pressure has not changed much.  It has been in the 170/103 to 184/103  range.  Last blood pressure tonight was 162/84 after taking an extra  half dose this evening, because he was a little concerned.  He had  called today and had no response, at which time he took the extra half  pill.   ASSESSMENT:  Hypertension.   PLAN:  Give the medicine time to work.  Three days is not enough for  Diovan to actually start controlling blood pressure well.  It does not  sound like his pressure is high enough to be terribly concerned.  Would  give it enough time at 80 mg dose at this point and stop checking his  blood pressure for a few days so he does not drive himself nuts, and  then start back in a week or so, checking his pressure and get back with  [name] if it has not improved.     Modesto Charon, MD  Electronically Signed    RNS/MedQ  DD: 03/26/2006  DT: 03/26/2006  Job #: [id number]

## 2010-07-26 NOTE — Assessment & Plan Note (Signed)
[place]                                 ON-CALL NOTE   NAME:Starzyk, ZASHAWN CONKEY                        MRN:          [id number]  DATE:04/05/2006                            DOB:          November 15, 1940    TIME RECEIVED:  8:44pm.   PHYSICIAN:  [name].   TELEPHONE NUMBER:  [phone]   The patient is calling because he is concerned about elevated blood  pressure readings. He has been Diovan now for the past several months.  He had been taking 160 mg 1/2 tablet per day until 3 days ago. At that  time, he noticed his blood pressures were rising. He spoke to Dr. Glori Bickers  on call, who told him to increase his dose to a whole 160 mg Diovan  tablet each morning. He has been doing this, however, he is concerned  about continued elevated readings. Today, he has readings from 123XX123  systolic over 123456 diastolic. He feels fine. He specifically denies  any dizziness, light-headedness, headache, chest pressure, chest pain,  shortness of breath, or any other problems. My advice is to take another  160 mg tablet of Diovan now, and he should be seen in the clinic  tomorrow morning. If he has any further difficulties, he can let me  know.     Ishmael Holter. Sarajane Jews, MD  Electronically Signed    SAF/MedQ  DD: 04/05/2006  DT: 04/06/2006  Job #: [id number]

## 2010-07-26 NOTE — Assessment & Plan Note (Signed)
[place]                                 ON-CALL NOTE   NAME:Nathan Russo, Nathan Russo                        MRN:          [id number]  DATE:04/04/2006                            DOB:          [birth year]    TIME RECEIVED:  9:58 a.m.   TELEPHONE:  [phone]   The patient is scheduled for a colonoscopy Tuesday morning and is to  begin drinking his bowel prep at 7 a.m. on Monday morning.  He has been  having elevated blood pressures over the past several days and wants to  know if he can still have the colonoscopy or not.  He apparently is not  treated for hypertension currently but does have a blood pressure  monitor at home.  He feels fine.  He specifically denies any dizziness,  headache, shortness of breath, or any other symptoms.  He has gotten  systolic values ranging from 160 to 123XX123 and diastolic values ranging  from 94 to 103 over the past two days.   My response is I doubt that he will be able to have the colonoscopy as  planned.  This can certainly be postponed.  I advised him to not begin  drinking his bowel prep Monday morning.  He should be seen in Dr. Gwynn Burly  office on Monday to evaluate his blood pressure and to treat it  accordingly.  If he develops any symptoms or begins to feel badly over  the weekend, he should call me back.     Ishmael Holter. Sarajane Jews, MD  Electronically Signed    SAF/MedQ  DD: 04/04/2006  DT: 04/04/2006  Job #: [id number]

## 2010-07-26 NOTE — Assessment & Plan Note (Signed)
[place]                                   ON-CALL NOTE   NAME:ELLISVaylen, Koppel                          MRN:          [id number]  DATE:01/26/2006                            DOB:          [birth year]    CALLER:  [facility]   PHONE NUMBER:  [phone]   PRIMARY CARE DOCTOR:  [name]   SUBJECTIVE:  Phenergan with Codeine one teaspoon p.o. q.8h. as needed was  written without a quantity.  One refill was given.   ASSESSMENT AND PLAN:  Authorized a quantity of 6 ounces.     Eliezer Lofts, MD  Electronically Signed    AB/MedQ  DD: 01/26/2006  DT: 01/27/2006  Job #: [id number]   cc:   Biagio Borg, MD

## 2010-07-26 NOTE — Assessment & Plan Note (Signed)
[place]                                 ON-CALL NOTE   NAME:Nathan Russo, Nathan Russo                          MRN:          [id number]  DATE:03/29/2006                            DOB:          1940-06-10    Time:  8:50 a.m.   Phone number is [phone].  Patient of [name].   CHIEF COMPLAINT:  High blood pressure.  The patient tells me a story  that he is on several medicines for high blood pressure in the recent  past.  Lisinopril gave him an ACE cough. Atenolol gave him bradycardia.  Then he was switched to Diovan this past Thursday 160 mg half a tablet a  day. Since he went on that his blood pressure has been really high,  175/103 to 106, sometimes 180s/99.  He feels okay but he is alarmed  about the high blood pressure.  He called the doctor on call last week  who told him to give it a little more time but it is still not working  and he is leaving today for [place] for a week.  I advised him to  increase his Diovan 160 mg from a half pill to a whole pill each day.  If he experiences any side effects such as headaches, dizziness, low  blood pressure, or anything else, he will go back to the half and call.  Otherwise he will follow up with [name] when he returns from being out  of town.  Of note, he denied having any kidney or liver problems or  anything else I should be aware of.     Marne A. Tower, MD  Electronically Signed    MAT/MedQ  DD: 03/29/2006  DT: 03/29/2006  Job #: [id number]   cc:   Biagio Borg, MD

## 2010-10-24 ENCOUNTER — Telehealth (INDEPENDENT_AMBULATORY_CARE_PROVIDER_SITE_OTHER): Payer: Self-pay

## 2010-10-24 NOTE — Telephone Encounter (Signed)
Patient called and said he saw Dr Ninfa Linden a while back about his GB. He wasn't symptomatic back then so patient decided to push off surgery. While on vacation last week he had a GB attack and had emergency surgery in [facility] Patient called to see if Ninfa Linden would take his staples out so he didn't have to go back down to [place]. I asked Deneise Lever if Dr Ninfa Linden would and she said no because he didn't do the surgery. I relayed the message to the patient and he was very upset. I apologized several times and then the patient hung up.

## 2010-10-30 ENCOUNTER — Ambulatory Visit: Payer: Medicare Other | Admitting: Internal Medicine

## 2010-10-31 ENCOUNTER — Ambulatory Visit (INDEPENDENT_AMBULATORY_CARE_PROVIDER_SITE_OTHER): Payer: Medicare Other | Admitting: Internal Medicine

## 2010-10-31 ENCOUNTER — Encounter: Payer: Self-pay | Admitting: Internal Medicine

## 2010-10-31 VITALS — BP 132/80 | HR 61 | Temp 98.5°F | Ht 72.0 in | Wt 227.0 lb

## 2010-10-31 DIAGNOSIS — Z9049 Acquired absence of other specified parts of digestive tract: Secondary | ICD-10-CM

## 2010-10-31 DIAGNOSIS — Z0000 Encounter for general adult medical examination without abnormal findings: Secondary | ICD-10-CM

## 2010-10-31 DIAGNOSIS — Z0001 Encounter for general adult medical examination with abnormal findings: Secondary | ICD-10-CM | POA: Insufficient documentation

## 2010-10-31 DIAGNOSIS — Z9889 Other specified postprocedural states: Secondary | ICD-10-CM

## 2010-10-31 DIAGNOSIS — K219 Gastro-esophageal reflux disease without esophagitis: Secondary | ICD-10-CM

## 2010-10-31 DIAGNOSIS — I10 Essential (primary) hypertension: Secondary | ICD-10-CM

## 2010-10-31 HISTORY — DX: Acquired absence of other specified parts of digestive tract: Z90.49

## 2010-10-31 NOTE — Assessment & Plan Note (Signed)
stable overall by hx and exam, and pt to continue medical treatment as before 

## 2010-10-31 NOTE — Assessment & Plan Note (Signed)
stable overall by hx and exam, most recent data reviewed with pt, and pt to continue medical treatment as before  BP Readings from Last 3 Encounters:  10/31/10 132/80  06/13/10 118/68  04/23/10 142/74

## 2010-10-31 NOTE — Assessment & Plan Note (Signed)
stable overall by hx and exam, most recent data reviewed with pt, and pt to continue medical treatment as before  Staples removed today

## 2010-10-31 NOTE — Patient Instructions (Signed)
Your staples were removed today Continue all other medications as before

## 2010-10-31 NOTE — Progress Notes (Signed)
Subjective:    Patient ID: Nathan Russo, male    DOB: 06/29/1940, 70 y.o.   MRN: [id number]  HPI  Here after on vacation near [place], but had flare of GB requiring surgury, no apparent complications, had some right rib soreness after, needs staples out today,  Pt denies fever, wt loss, night sweats, loss of appetite, or other constitutional symptoms.  Pt denies chest pain, increased sob or doe, wheezing, orthopnea, PND, increased LE swelling, palpitations, dizziness or syncope.  Pt denies new neurological symptoms such as new headache, or facial or extremity weakness or numbness   Pt denies polydipsia, polyuria.  Did have some post op surgical infection for which pt tx with antibx at urgent care from [place] when he was there.   Denies worsening reflux, dysphagia, abd pain, n/v, bowel change or blood.   Past Medical History  Diagnosis Date  . THYROID NODULE 02/07/2010  . DIABETES MELLITUS, TYPE II 04/28/2007  . HYPERLIPIDEMIA 04/28/2007  . OBESITY 10/24/2006  . CARPAL TUNNEL SYNDROME, BILATERAL 02/07/2009  . OTITIS MEDIA, ACUTE, LEFT 12/26/2009  . HYPERTENSION 10/21/2006  . BRADYCARDIA, CHRONIC 10/24/2006  . GERD 02/11/2009  . PHIMOSIS 02/07/2009  . DEGENERATIVE JOINT DISEASE, RIGHT KNEE 10/24/2006  . Dizziness and giddiness 12/19/2009  . FATIGUE 04/28/2007  . Headache 12/19/2009  . NECK MASS 02/07/2010  . CHEST PAIN-UNSPECIFIED 09/05/2008  . COLONIC POLYPS, HX OF 04/28/2007  . Abdominal pain, epigastric 04/11/2010  . CHOLELITHIASIS 04/22/2010  . BELCHING 04/23/2010  . Thyroid cancer 06/13/2010  . Cholelithiasis 06/13/2010  . Elevated PSA 06/13/2010   Past Surgical History  Procedure Date  . Left knee surgery   . Right wrist surgury     reports that he has quit smoking. He does not have any smokeless tobacco history on file. He reports that he does not drink alcohol or use illicit drugs. family history includes Arthritis in his mother; Cancer in his brother and sister; Diabetes in his  brother; Goiter in his mother; Heart attack (age of onset:77) in his brother; Heart attack (age of onset:88) in his father; Hypertension in his mother; and Hypothyroidism in his sister. Allergies  Allergen Reactions  . Ace Inhibitors     REACTION: cough  . Atenolol     REACTION: bradycardia  . Codeine   . Lovastatin     REACTION: myalygros   Current Outpatient Prescriptions on File Prior to Visit  Medication Sig Dispense Refill  . Ascorbic Acid (VITAMIN C) 1000 MG tablet Take 1,000 mg by mouth daily.        [name] aspirin 81 MG EC tablet Take 81 mg by mouth daily.        . benazepril (LOTENSIN) 40 MG tablet Take 40 mg by mouth daily.        . Blood Glucose Monitoring Suppl (ACCU-CHEK COMPACT CARE KIT) KIT by Does not apply route.        . calcium carbonate (OS-CAL) 600 MG TABS Take 600 mg by mouth daily.        . Cinnamon 500 MG capsule Take 500 mg by mouth 2 (two) times daily.        . Garlic Oil (ODORLESS GARLIC) [id number] MG TABS Take by mouth 2 (two) times daily.        . Glucosamine-Chondroit-Vit C-Mn (GLUCOSAMINE CHONDROITIN COMPLX) CAPS Take by mouth daily.        . Glucose Blood (ACCU-CHEK INSTANT GLUCOSE TEST VI) by In Vitro route 4 (four) times daily.        [name]  meclizine (ANTIVERT) 12.5 MG tablet Take 12.5 mg by mouth every 6 (six) hours as needed.        . metFORMIN (GLUMETZA) 500 MG (MOD) 24 hr tablet Take 500 mg by mouth. 1 by mouth in the AM, 1 by mouth afternoon, take 2 by mouth in the evening       . Multiple Vitamin (MULTIVITAMIN) capsule Take 1 capsule by mouth daily.        . Omega-3 Fatty Acids (FISH OIL) 1000 MG CAPS Take by mouth 2 (two) times daily.        [name] omeprazole (PRILOSEC) 20 MG capsule Take 20 mg by mouth daily.        [name] VITAMIN D, CHOLECALCIFEROL, PO Take by mouth daily.        [name] amLODipine (NORVASC) 10 MG tablet Take 10 mg by mouth daily.        . cephALEXin (KEFLEX) 500 MG capsule Take 500 mg by mouth 3 (three) times daily.        [name] HYDROcodone-acetaminophen  (NORCO) 5-325 MG per tablet Take 1 tablet by mouth. Per ortho       . HYDROcodone-homatropine (HYCODAN) 5-1.5 MG/5ML syrup Take by mouth every 6 (six) hours as needed.        [name] levofloxacin (LEVAQUIN) 250 MG tablet Take 250 mg by mouth daily.        [name] scopolamine (TRANSDERM-SCOP) 1.5 MG Place 1 patch onto the skin every third day.         Review of Systems Review of Systems  Constitutional: Negative for diaphoresis and unexpected weight change.  HENT: Negative for drooling and tinnitus.   Eyes: Negative for photophobia and visual disturbance.  Respiratory: Negative for choking and stridor.   Gastrointestinal: Negative for vomiting and blood in stool.  Genitourinary: Negative for hematuria and decreased urine volume.    Objective:   Physical Exam BP 132/80  Pulse 61  Temp(Src) 98.5 F (36.9 C) (Oral)  Ht 6' (1.829 m)  Wt 227 lb (102.967 kg)  BMI 30.79 kg/m2  SpO2 96% Physical Exam  VS noted Constitutional: Pt appears well-developed and well-nourished.  HENT: Head: Normocephalic.  Right Ear: External ear normal.  Left Ear: External ear normal.  Eyes: Conjunctivae and EOM are normal. Pupils are equal, round, and reactive to light.  Neck: Normal range of motion. Neck supple.  Cardiovascular: Normal rate and regular rhythm.   Pulmonary/Chest: Effort normal and breath sounds normal.  Abd:  Soft, NT, non-distended, + BS Neurological: Pt is alert. No cranial nerve deficit.  Skin: Skin is warm. No erythema.  Psychiatric: Pt behavior is normal. Thought content normal.  Total 8 staples removed, no evidence for cellultis       Assessment & Plan:

## 2010-11-06 ENCOUNTER — Other Ambulatory Visit: Payer: Self-pay | Admitting: Internal Medicine

## 2010-12-13 ENCOUNTER — Other Ambulatory Visit: Payer: Self-pay

## 2010-12-13 ENCOUNTER — Other Ambulatory Visit (INDEPENDENT_AMBULATORY_CARE_PROVIDER_SITE_OTHER): Payer: Medicare Other

## 2010-12-13 ENCOUNTER — Encounter: Payer: Self-pay | Admitting: Internal Medicine

## 2010-12-13 ENCOUNTER — Ambulatory Visit (INDEPENDENT_AMBULATORY_CARE_PROVIDER_SITE_OTHER): Payer: Medicare Other | Admitting: Internal Medicine

## 2010-12-13 ENCOUNTER — Ambulatory Visit: Payer: Medicare Other | Admitting: Internal Medicine

## 2010-12-13 VITALS — BP 122/68 | HR 58 | Temp 98.0°F | Ht 72.0 in | Wt 223.1 lb

## 2010-12-13 DIAGNOSIS — Z23 Encounter for immunization: Secondary | ICD-10-CM

## 2010-12-13 DIAGNOSIS — J209 Acute bronchitis, unspecified: Secondary | ICD-10-CM

## 2010-12-13 DIAGNOSIS — E119 Type 2 diabetes mellitus without complications: Secondary | ICD-10-CM

## 2010-12-13 DIAGNOSIS — G8929 Other chronic pain: Secondary | ICD-10-CM | POA: Insufficient documentation

## 2010-12-13 DIAGNOSIS — I10 Essential (primary) hypertension: Secondary | ICD-10-CM

## 2010-12-13 LAB — HEMOGLOBIN A1C: Hgb A1c MFr Bld: 6.2 % (ref 4.6–6.5)

## 2010-12-13 LAB — BASIC METABOLIC PANEL
CO2: 26 mEq/L (ref 19–32)
Calcium: 9.6 mg/dL (ref 8.4–10.5)
Chloride: 105 mEq/L (ref 96–112)
Glucose, Bld: 134 mg/dL — ABNORMAL HIGH (ref 70–99)
Sodium: 138 mEq/L (ref 135–145)

## 2010-12-13 LAB — LIPID PANEL
HDL: 33.8 mg/dL — ABNORMAL LOW (ref >39.00)
Total CHOL/HDL Ratio: 5

## 2010-12-13 MED ORDER — TRAMADOL HCL ER 100 MG PO TB24: 100.0000 mg | ORAL_TABLET | Freq: Every day | ORAL | Status: DC

## 2010-12-13 MED ORDER — DILTIAZEM HCL ER COATED BEADS 180 MG PO CP24: 180.0000 mg | ORAL_CAPSULE | Freq: Every day | ORAL | Status: DC

## 2010-12-13 MED ORDER — AZITHROMYCIN 250 MG PO TABS: ORAL_TABLET | ORAL | Status: AC

## 2010-12-13 MED ORDER — DILTIAZEM HCL ER COATED BEADS 120 MG PO CP24: 120.0000 mg | ORAL_CAPSULE | Freq: Every day | ORAL | Status: DC

## 2010-12-13 MED ORDER — AZITHROMYCIN 250 MG PO TABS: ORAL_TABLET | ORAL | Status: DC

## 2010-12-13 NOTE — Assessment & Plan Note (Signed)
stable overall by hx and exam, most recent data reviewed with pt, and pt to continue medical treatment as before  Lab Results  Component Value Date   HGBA1C 7.0* 06/13/2010

## 2010-12-13 NOTE — Assessment & Plan Note (Signed)
With gen;d OA pain - has ongoing multiple ortho f/u; for ultram ER 100 qd, try to minimize nsaid use,  to f/u any worsening symptoms or concerns\
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
## 2010-12-13 NOTE — Assessment & Plan Note (Signed)
Has seen Dr Tannenbau/urology - elev PSA though due to bladder catheterization, with f/u UA, PSA and exam benign; to /fu in 1 yr

## 2010-12-13 NOTE — Assessment & Plan Note (Signed)
stable overall by hx and exam, most recent data reviewed with pt, and pt to continue medical treatment as before  BP Readings from Last 3 Encounters:  12/13/10 122/68  10/31/10 132/80  06/13/10 118/68

## 2010-12-13 NOTE — Assessment & Plan Note (Signed)
Mild to mod, for antibx course,  to f/u any worsening symptoms or concerns 

## 2010-12-13 NOTE — Patient Instructions (Addendum)
You had the flu shot today Take all new medications as prescribed - the pain medication, and antibiotic Please call if the insurance does not cover the ER version of the tramadol for pain, and it can be changed Please go to LAB in the Basement for the blood and/or urine tests to be done today Please call the phone number [phone] (the [place]) for results of testing in 2-3 days;  When calling, simply dial the number, and when prompted enter the MRN number above (the Medical Record Number) and the # key, then the message should start. Please return in 6 months, or sooner if needed

## 2010-12-16 ENCOUNTER — Telehealth: Payer: Self-pay

## 2010-12-16 MED ORDER — TRAMADOL HCL 50 MG PO TABS: 50.0000 mg | ORAL_TABLET | Freq: Four times a day (QID) | ORAL | Status: DC | PRN

## 2010-12-16 NOTE — Telephone Encounter (Signed)
The pain medication sent to [facility] was too expensive, please advise. Call back number is [phone]

## 2010-12-16 NOTE — Telephone Encounter (Signed)
Ultram ER changed to "regular" tramadol - done per emr

## 2010-12-16 NOTE — Telephone Encounter (Signed)
Patient informed. 

## 2010-12-19 ENCOUNTER — Other Ambulatory Visit: Payer: Self-pay

## 2010-12-19 MED ORDER — TRAMADOL HCL 50 MG PO TABS: 50.0000 mg | ORAL_TABLET | Freq: Four times a day (QID) | ORAL | Status: DC | PRN

## 2010-12-20 ENCOUNTER — Ambulatory Visit: Payer: Medicare Other | Admitting: Internal Medicine

## 2010-12-25 ENCOUNTER — Telehealth: Payer: Self-pay | Admitting: *Deleted

## 2010-12-25 MED ORDER — TRAMADOL HCL 50 MG PO TABS: 50.0000 mg | ORAL_TABLET | Freq: Four times a day (QID) | ORAL | Status: AC | PRN

## 2010-12-25 NOTE — Telephone Encounter (Signed)
Pt states md sent in tramadol to local [facility]. Needed med to go to [facility] because he can get generic no charge with insurance. Let pt know will resend to [facility]....12/25/10@3 :20pm/LMB

## 2011-01-15 ENCOUNTER — Other Ambulatory Visit: Payer: Self-pay | Admitting: Internal Medicine

## 2011-01-20 ENCOUNTER — Telehealth: Payer: Self-pay

## 2011-01-20 NOTE — Telephone Encounter (Signed)
Pharmacy informed.

## 2011-01-20 NOTE — Telephone Encounter (Signed)
Pharmacy to clarify recent Amlodipine 10 mg sent in and also prescription for  Cardizem 120 mg, pharmacy states these two medications are duplicate therapy, please advise

## 2011-01-20 NOTE — Telephone Encounter (Signed)
Ok to hold the [employer] all other meds as is

## 2011-01-22 ENCOUNTER — Telehealth: Payer: Self-pay

## 2011-01-22 MED ORDER — BENAZEPRIL HCL 40 MG PO TABS: 40.0000 mg | ORAL_TABLET | Freq: Every day | ORAL | Status: DC

## 2011-01-22 MED ORDER — DILTIAZEM HCL ER COATED BEADS 120 MG PO CP24: 120.0000 mg | ORAL_CAPSULE | Freq: Every day | ORAL | Status: DC

## 2011-01-22 NOTE — Telephone Encounter (Signed)
The patient called to inform that there has been some confusion with [facility] and his prescriptions. The patient is suppose to be on Diltiazem per his cardiologist until his heart ablation and after that is completed his cardiologist would like him to followup with his PCP to start back on amlodipine. Also he needs a refill on benazepril. [facility] has canceled his Diltiazem 120 mg prescription and that needs to be reactivated.  Please advise as the patient was somewhat concerned about the matter.

## 2011-01-22 NOTE — Telephone Encounter (Signed)
Sorry, this explaiins the recent confusion , and I had advised [facility] to stop the cardizem cd 120 qd  Ok to stop the Amlodipine 10  Ok to re-start the Diltiazem CD 120 mg qd  Ok for [employer] refill - done hardcopy to [employer]

## 2011-01-23 ENCOUNTER — Other Ambulatory Visit: Payer: Self-pay | Admitting: Internal Medicine

## 2011-01-23 NOTE — Telephone Encounter (Signed)
Patient informed the medication confusion has been cleared up. Also called [facility] and spoke to a pharmacist informed of all information.  Refills as well were taken care of.

## 2011-02-06 DIAGNOSIS — I499 Cardiac arrhythmia, unspecified: Secondary | ICD-10-CM | POA: Insufficient documentation

## 2011-02-11 DIAGNOSIS — C73 Malignant neoplasm of thyroid gland: Secondary | ICD-10-CM | POA: Insufficient documentation

## 2011-02-20 ENCOUNTER — Ambulatory Visit (INDEPENDENT_AMBULATORY_CARE_PROVIDER_SITE_OTHER): Payer: Medicare Other | Admitting: Internal Medicine

## 2011-02-20 ENCOUNTER — Encounter: Payer: Self-pay | Admitting: Internal Medicine

## 2011-02-20 DIAGNOSIS — F3289 Other specified depressive episodes: Secondary | ICD-10-CM

## 2011-02-20 DIAGNOSIS — G5600 Carpal tunnel syndrome, unspecified upper limb: Secondary | ICD-10-CM

## 2011-02-20 DIAGNOSIS — R42 Dizziness and giddiness: Secondary | ICD-10-CM

## 2011-02-20 DIAGNOSIS — F329 Major depressive disorder, single episode, unspecified: Secondary | ICD-10-CM

## 2011-02-20 DIAGNOSIS — G8929 Other chronic pain: Secondary | ICD-10-CM

## 2011-02-20 MED ORDER — ESCITALOPRAM OXALATE 10 MG PO TABS: 10.0000 mg | ORAL_TABLET | Freq: Every day | ORAL | Status: DC

## 2011-02-20 MED ORDER — TRAMADOL HCL 50 MG PO TABS: 50.0000 mg | ORAL_TABLET | Freq: Four times a day (QID) | ORAL | Status: DC | PRN

## 2011-02-20 MED ORDER — MECLIZINE HCL 12.5 MG PO TABS: 12.5000 mg | ORAL_TABLET | Freq: Four times a day (QID) | ORAL | Status: DC | PRN

## 2011-02-20 NOTE — Patient Instructions (Signed)
Take all new medications as prescribed Continue all other medications as before Please return in 1 month, or sooner if needed

## 2011-02-23 ENCOUNTER — Encounter: Payer: Self-pay | Admitting: Internal Medicine

## 2011-02-23 DIAGNOSIS — F32A Depression, unspecified: Secondary | ICD-10-CM

## 2011-02-23 DIAGNOSIS — R42 Dizziness and giddiness: Secondary | ICD-10-CM | POA: Insufficient documentation

## 2011-02-23 DIAGNOSIS — F329 Major depressive disorder, single episode, unspecified: Secondary | ICD-10-CM | POA: Insufficient documentation

## 2011-02-23 HISTORY — DX: Depression, unspecified: F32.A

## 2011-02-23 NOTE — Assessment & Plan Note (Signed)
New onset, likely due to mult stressors, verified nonsuicidal, declines counseling, ok for lexapro 10 mg daily, f/u 4 wks

## 2011-02-23 NOTE — Assessment & Plan Note (Signed)
Exam benign, ok for bilat wrist splint asd,  to f/u any worsening symptoms or concerns

## 2011-02-23 NOTE — Assessment & Plan Note (Signed)
?   BPPV vs other - doubt CNS related, for antivert prn,  to f/u any worsening symptoms or concerns
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
## 2011-02-23 NOTE — Assessment & Plan Note (Signed)
Ongoing stable, ok for tramadol prn,  to f/u any worsening symptoms or concerns

## 2011-03-06 ENCOUNTER — Encounter: Payer: Self-pay | Admitting: Internal Medicine

## 2011-03-06 ENCOUNTER — Ambulatory Visit (INDEPENDENT_AMBULATORY_CARE_PROVIDER_SITE_OTHER): Payer: Medicare Other | Admitting: Internal Medicine

## 2011-03-06 DIAGNOSIS — E119 Type 2 diabetes mellitus without complications: Secondary | ICD-10-CM

## 2011-03-06 DIAGNOSIS — F329 Major depressive disorder, single episode, unspecified: Secondary | ICD-10-CM

## 2011-03-06 DIAGNOSIS — I10 Essential (primary) hypertension: Secondary | ICD-10-CM

## 2011-03-06 DIAGNOSIS — R220 Localized swelling, mass and lump, head: Secondary | ICD-10-CM

## 2011-03-06 DIAGNOSIS — R221 Localized swelling, mass and lump, neck: Secondary | ICD-10-CM

## 2011-03-06 NOTE — Assessment & Plan Note (Signed)
Hx and exam not c/w acute infection, allergic reaction or even recurrent malignancy;  I suspect "blocked" salivary gland ;  Since no fever will hold on specific tx such as antibx and is painless, doubt allergic;  I recommended pt f/u with ENT at [facility] since he has been seen there previosly for thyroid ca, but if needed can be referred locally if pt calls to let us know

## 2011-03-06 NOTE — Assessment & Plan Note (Signed)
stable overall by hx and exam, most recent data reviewed with pt, and pt to continue medical treatment as before  BP Readings from Last 3 Encounters:  03/06/11 120/78  02/20/11 142/70  12/13/10 122/68

## 2011-03-06 NOTE — Patient Instructions (Addendum)
Please see Dr Darron Doom at [facility] for the left facial swelling and lump, which I believe is a "stopped up" salivary gland OK to continue the lexapro for now, but with the sleepiness and your insurance change, please call with other options your new insurance will pay for (and also consider Cymbalta, if it is not too expensive because this helps also with pain) Continue all other medications as before I think you can cancel the Jan 16 appt with me, since you were here today, and you are otherwise doing well  OK to f/u in 6 months, or sooner if needed

## 2011-03-06 NOTE — Assessment & Plan Note (Signed)
stable overall by hx and exam, most recent data reviewed with pt, and pt to continue medical treatment as before  Lab Results  Component Value Date   HGBA1C 6.2 12/13/2010

## 2011-03-06 NOTE — Assessment & Plan Note (Signed)
With mild somnolence on the new lexapro, so pt to find out what his new insurance for 2013 prefers, or even consider cymbalta given his chronic pain as well if not too expensive, pt to call with his preference

## 2011-03-06 NOTE — Progress Notes (Signed)
Subjective:    Patient ID: Nathan Russo, male    DOB: 1940-10-05, 70 y.o.   MRN: [id number]  HPI  Here with 2 days onset nonpainful nontender nonpruritic left facial swelling and firmness area to the area below the jaw;  No HA, fever, ST, cough, chills, other pain or other neck massm, or tongue swelling.  Has hx of thyroid Cancer, has seen ENT - Dr Owens Shark at [facility] in the past.  Pt denies chest pain, increased sob or doe, wheezing, orthopnea, PND, increased LE swelling, palpitations, dizziness or syncope.Pt denies new neurological symptoms such as new headache, or facial or extremity weakness or numbness   Pt denies polydipsia, polyuria.  After exam today he recalls his brother had a blocked salivary gland due to a stone. Pt has not prior hx of this.   Pt denies fever, wt loss, night sweats, loss of appetite, or other constitutional symptoms, and actually was not aware of the new problem until noticed by his wife.  Alsomentions he is doing better on the lexapro overall with less depressed mood, but has increased mild somnolence, and mild increased anxiety. Past Medical History  Diagnosis Date  . THYROID NODULE 02/07/2010  . DIABETES MELLITUS, TYPE II 04/28/2007  . HYPERLIPIDEMIA 04/28/2007  . OBESITY 10/24/2006  . CARPAL TUNNEL SYNDROME, BILATERAL 02/07/2009  . OTITIS MEDIA, ACUTE, LEFT 12/26/2009  . HYPERTENSION 10/21/2006  . BRADYCARDIA, CHRONIC 10/24/2006  . GERD 02/11/2009  . PHIMOSIS 02/07/2009  . DEGENERATIVE JOINT DISEASE, RIGHT KNEE 10/24/2006  . Dizziness and giddiness 12/19/2009  . FATIGUE 04/28/2007  . Headache 12/19/2009  . NECK MASS 02/07/2010  . CHEST PAIN-UNSPECIFIED 09/05/2008  . COLONIC POLYPS, HX OF 04/28/2007  . Abdominal pain, epigastric 04/11/2010  . CHOLELITHIASIS 04/22/2010  . BELCHING 04/23/2010  . Thyroid cancer 06/13/2010  . Cholelithiasis 06/13/2010  . Elevated PSA 06/13/2010  . S/P laparoscopic cholecystectomy 10/31/2010  . Depression 02/23/2011   Past Surgical History    Procedure Date  . Left knee surgery   . Right wrist surgury     reports that he has quit smoking. He does not have any smokeless tobacco history on file. He reports that he does not drink alcohol or use illicit drugs. family history includes Arthritis in his mother; Cancer in his brother and sister; Diabetes in his brother; Goiter in his mother; Heart attack (age of onset:77) in his brother; Heart attack (age of onset:88) in his father; Hypertension in his mother; and Hypothyroidism in his sister. Allergies  Allergen Reactions  . Ace Inhibitors     REACTION: cough  . Atenolol     REACTION: bradycardia  . Codeine   . Lovastatin     REACTION: myalygros   Review of Systems Review of Systems  Constitutional: Negative for diaphoresis and unexpected weight change.  HENT: Negative for drooling and tinnitus.   Eyes: Negative for photophobia and visual disturbance.  Respiratory: Negative for choking and stridor.   Gastrointestinal: Negative for vomiting and blood in stool.  Genitourinary: Negative for hematuria and decreased urine volume.    Objective:   Physical Exam BP 120/78  Pulse 54  Temp(Src) 97.9 F (36.6 C) (Oral)  Ht 6' (1.829 m)  Wt 224 lb 8 oz (101.833 kg)  BMI 30.45 kg/m2  SpO2 97% Physical Exam  VS noted Constitutional: Pt appears well-developed and well-nourished.  HENT: Head: Normocephalic.  Right Ear: External ear normal.  Left Ear: External ear normal.  Left face with lower 1/3 with nondiscrete nontender swelling  with a more discrete mass likely > 1 cm to left submandibular area, nontender, mobile Neck:  No other mass or swelling noted Eyes: Conjunctivae and EOM are normal. Pupils are equal, round, and reactive to light.  Neck: Normal range of motion. Neck supple.  Cardiovascular: Normal rate and regular rhythm.   Pulmonary/Chest: Effort normal and breath sounds normal.  Neurological: Pt is alert. No cranial nerve deficit.  Skin: Skin is warm. No erythema.   Psychiatric: Pt behavior is normal. Thought content normal. less depressed affect, mild nervous    Assessment & Plan:

## 2011-03-07 ENCOUNTER — Telehealth: Payer: Self-pay

## 2011-03-07 NOTE — Telephone Encounter (Signed)
Patient called [name] stating that he was seen 03/06/11 by MD who discussed sending in a abx " to help with saliva gland". Patient checked with his pharmacy who advised nothing received from MD

## 2011-03-07 NOTE — Telephone Encounter (Signed)
I had mentioned it, but did not feel necessary at end of visit as he has no pain or fever and very unlikely then to help

## 2011-03-10 NOTE — Telephone Encounter (Signed)
Patient informed of MD's instructions

## 2011-03-26 ENCOUNTER — Ambulatory Visit: Payer: Medicare Other | Admitting: Internal Medicine

## 2011-03-27 DIAGNOSIS — F339 Major depressive disorder, recurrent, unspecified: Secondary | ICD-10-CM | POA: Diagnosis not present

## 2011-03-27 DIAGNOSIS — M542 Cervicalgia: Secondary | ICD-10-CM | POA: Diagnosis not present

## 2011-03-27 DIAGNOSIS — F4542 Pain disorder with related psychological factors: Secondary | ICD-10-CM | POA: Diagnosis not present

## 2011-03-27 DIAGNOSIS — M545 Low back pain: Secondary | ICD-10-CM | POA: Diagnosis not present

## 2011-04-06 ENCOUNTER — Telehealth: Payer: Self-pay | Admitting: Internal Medicine

## 2011-04-06 NOTE — Telephone Encounter (Signed)
Robin to contact pt;  I have received his anti-depressant med options list, and his current Lexapro is tier 2 on the list;    Is this OK to continue as is or does he need change to tier 1 such as prozac or zoloft?

## 2011-04-07 MED ORDER — SERTRALINE HCL 100 MG PO TABS: 100.0000 mg | ORAL_TABLET | Freq: Every day | ORAL | Status: DC

## 2011-04-07 NOTE — Telephone Encounter (Signed)
Called the patient and is ok to change to tier 1 prozac or zoloft [facility] Please

## 2011-04-07 NOTE — Telephone Encounter (Signed)
Ok to stop the lexapro  OK to start the generic zoloft at HALF pill for 3 days, then whole pill after that (to avoid diarrhea)- done per emr  Pt to call in 3-4 wks if feels needs higher strength  Robin to notify pt

## 2011-04-07 NOTE — Telephone Encounter (Signed)
Called informed the patient of medication change and instructions.

## 2011-04-10 DIAGNOSIS — F339 Major depressive disorder, recurrent, unspecified: Secondary | ICD-10-CM | POA: Diagnosis not present

## 2011-04-10 DIAGNOSIS — F4542 Pain disorder with related psychological factors: Secondary | ICD-10-CM | POA: Diagnosis not present

## 2011-04-11 DIAGNOSIS — M542 Cervicalgia: Secondary | ICD-10-CM | POA: Diagnosis not present

## 2011-04-23 DIAGNOSIS — F339 Major depressive disorder, recurrent, unspecified: Secondary | ICD-10-CM | POA: Diagnosis not present

## 2011-04-23 DIAGNOSIS — F4542 Pain disorder with related psychological factors: Secondary | ICD-10-CM | POA: Diagnosis not present

## 2011-05-01 DIAGNOSIS — S335XXA Sprain of ligaments of lumbar spine, initial encounter: Secondary | ICD-10-CM | POA: Diagnosis not present

## 2011-05-01 DIAGNOSIS — F4542 Pain disorder with related psychological factors: Secondary | ICD-10-CM | POA: Diagnosis not present

## 2011-05-01 DIAGNOSIS — S139XXA Sprain of joints and ligaments of unspecified parts of neck, initial encounter: Secondary | ICD-10-CM | POA: Diagnosis not present

## 2011-05-01 DIAGNOSIS — F339 Major depressive disorder, recurrent, unspecified: Secondary | ICD-10-CM | POA: Diagnosis not present

## 2011-05-01 DIAGNOSIS — M542 Cervicalgia: Secondary | ICD-10-CM | POA: Diagnosis not present

## 2011-05-01 DIAGNOSIS — M545 Low back pain: Secondary | ICD-10-CM | POA: Diagnosis not present

## 2011-05-02 ENCOUNTER — Encounter: Payer: Self-pay | Admitting: Internal Medicine

## 2011-05-07 ENCOUNTER — Encounter: Payer: Self-pay | Admitting: Internal Medicine

## 2011-05-16 DIAGNOSIS — F339 Major depressive disorder, recurrent, unspecified: Secondary | ICD-10-CM | POA: Diagnosis not present

## 2011-05-16 DIAGNOSIS — F4542 Pain disorder with related psychological factors: Secondary | ICD-10-CM | POA: Diagnosis not present

## 2011-05-28 DIAGNOSIS — F4542 Pain disorder with related psychological factors: Secondary | ICD-10-CM | POA: Diagnosis not present

## 2011-05-28 DIAGNOSIS — F339 Major depressive disorder, recurrent, unspecified: Secondary | ICD-10-CM | POA: Diagnosis not present

## 2011-06-13 ENCOUNTER — Ambulatory Visit: Payer: Medicare Other | Admitting: Internal Medicine

## 2011-06-19 DIAGNOSIS — M542 Cervicalgia: Secondary | ICD-10-CM | POA: Diagnosis not present

## 2011-06-19 DIAGNOSIS — F339 Major depressive disorder, recurrent, unspecified: Secondary | ICD-10-CM | POA: Diagnosis not present

## 2011-06-19 DIAGNOSIS — F4542 Pain disorder with related psychological factors: Secondary | ICD-10-CM | POA: Diagnosis not present

## 2011-06-23 ENCOUNTER — Encounter: Payer: Self-pay | Admitting: Internal Medicine

## 2011-06-24 ENCOUNTER — Other Ambulatory Visit: Payer: Self-pay | Admitting: *Deleted

## 2011-06-24 MED ORDER — BENAZEPRIL HCL 40 MG PO TABS: 40.0000 mg | ORAL_TABLET | Freq: Every day | ORAL | Status: DC

## 2011-06-24 MED ORDER — OMEPRAZOLE 20 MG PO CPDR: 20.0000 mg | DELAYED_RELEASE_CAPSULE | Freq: Every day | ORAL | Status: DC

## 2011-06-24 NOTE — Telephone Encounter (Signed)
[facility]-pt needs refills of Benazepril and Omeprazole to go to local pharmacy.

## 2011-06-26 DIAGNOSIS — J40 Bronchitis, not specified as acute or chronic: Secondary | ICD-10-CM | POA: Diagnosis not present

## 2011-06-27 ENCOUNTER — Encounter: Payer: Medicare Other | Admitting: Internal Medicine

## 2011-07-11 ENCOUNTER — Encounter: Payer: Medicare Other | Admitting: *Deleted

## 2011-07-11 ENCOUNTER — Ambulatory Visit (AMBULATORY_SURGERY_CENTER): Payer: Medicare Other | Admitting: *Deleted

## 2011-07-11 VITALS — Ht 72.0 in | Wt 212.0 lb

## 2011-07-11 DIAGNOSIS — Z86010 Personal history of colonic polyps: Secondary | ICD-10-CM

## 2011-07-11 DIAGNOSIS — Z800 Family history of malignant neoplasm of digestive organs: Secondary | ICD-10-CM

## 2011-07-11 MED ORDER — BISACODYL 5 MG PO TBEC: 5.0000 mg | DELAYED_RELEASE_TABLET | ORAL | Status: AC

## 2011-07-11 MED ORDER — PEG 3350-KCL-NABCB-NACL-NASULF 240 G PO SOLR: 4000.0000 mL | Freq: Once | ORAL | Status: AC

## 2011-07-11 NOTE — Progress Notes (Signed)
Patient states he can not afford moviprep, wants cheapest prep. Called patient's pharmacy moviprep=$24 & Colyte=$0. Colyte given per patient's request.

## 2011-07-18 ENCOUNTER — Encounter: Payer: Medicare Other | Admitting: Internal Medicine

## 2011-07-31 ENCOUNTER — Encounter: Payer: Medicare Other | Admitting: Internal Medicine

## 2011-08-01 ENCOUNTER — Other Ambulatory Visit: Payer: Self-pay | Admitting: Internal Medicine

## 2011-08-05 ENCOUNTER — Ambulatory Visit (AMBULATORY_SURGERY_CENTER): Payer: Medicare Other | Admitting: Internal Medicine

## 2011-08-05 ENCOUNTER — Encounter: Payer: Self-pay | Admitting: Internal Medicine

## 2011-08-05 VITALS — BP 136/78 | HR 60 | Temp 97.2°F | Resp 18 | Ht 72.0 in | Wt 212.0 lb

## 2011-08-05 DIAGNOSIS — E119 Type 2 diabetes mellitus without complications: Secondary | ICD-10-CM | POA: Diagnosis not present

## 2011-08-05 DIAGNOSIS — I10 Essential (primary) hypertension: Secondary | ICD-10-CM | POA: Diagnosis not present

## 2011-08-05 DIAGNOSIS — Z860100 Personal history of colon polyps, unspecified: Secondary | ICD-10-CM

## 2011-08-05 DIAGNOSIS — Z800 Family history of malignant neoplasm of digestive organs: Secondary | ICD-10-CM | POA: Diagnosis not present

## 2011-08-05 DIAGNOSIS — E785 Hyperlipidemia, unspecified: Secondary | ICD-10-CM | POA: Diagnosis not present

## 2011-08-05 DIAGNOSIS — D126 Benign neoplasm of colon, unspecified: Secondary | ICD-10-CM | POA: Diagnosis not present

## 2011-08-05 LAB — GLUCOSE, CAPILLARY: Glucose-Capillary: 106 mg/dL — ABNORMAL HIGH (ref 70–99)

## 2011-08-05 MED ORDER — SODIUM CHLORIDE 0.9 % IV SOLN: 500.0000 mL | INTRAVENOUS | Status: DC

## 2011-08-05 NOTE — Patient Instructions (Signed)
Handouts were given to your care partner on diverticulosis and high fiber diet.  Per Dr. Olevia Perches take over the counter metamucil 1 tsp by mouth daily.  You may resume your prior medications today.  Please call if any questions or concerns.   YOU HAD AN ENDOSCOPIC PROCEDURE TODAY AT [facility]: Refer to the procedure report that was given to you for any specific questions about what was found during the examination.  If the procedure report does not answer your questions, please call your gastroenterologist to clarify.  If you requested that your care partner not be given the details of your procedure findings, then the procedure report has been included in a sealed envelope for you to review at your convenience later.  YOU SHOULD EXPECT: Some feelings of bloating in the abdomen. Passage of more gas than usual.  Walking can help get rid of the air that was put into your GI tract during the procedure and reduce the bloating. If you had a lower endoscopy (such as a colonoscopy or flexible sigmoidoscopy) you may notice spotting of blood in your stool or on the toilet paper. If you underwent a bowel prep for your procedure, then you may not have a normal bowel movement for a few days.  DIET: Your first meal following the procedure should be a light meal and then it is ok to progress to your normal diet.  A half-sandwich or bowl of soup is an example of a good first meal.  Heavy or fried foods are harder to digest and may make you feel nauseous or bloated.  Likewise meals heavy in dairy and vegetables can cause extra gas to form and this can also increase the bloating.  Drink plenty of fluids but you should avoid alcoholic beverages for 24 hours.  ACTIVITY: Your care partner should take you home directly after the procedure.  You should plan to take it easy, moving slowly for the rest of the day.  You can resume normal activity the day after the procedure however you should NOT DRIVE or use heavy  machinery for 24 hours (because of the sedation medicines used during the test).    SYMPTOMS TO REPORT IMMEDIATELY: A gastroenterologist can be reached at any hour.  During normal business hours, 8:30 AM to 5:00 PM Monday through Friday, call [phone].  After hours and on weekends, please call the GI answering service at [phone] who will take a message and have the physician on call contact you.   Following lower endoscopy (colonoscopy or flexible sigmoidoscopy):  Excessive amounts of blood in the stool  Significant tenderness or worsening of abdominal pains  Swelling of the abdomen that is new, acute  Fever of 100F or higher   FOLLOW UP: If any biopsies were taken you will be contacted by phone or by letter within the next 1-3 weeks.  Call your gastroenterologist if you have not heard about the biopsies in 3 weeks.  Our staff will call the home number listed on your records the next business day following your procedure to check on you and address any questions or concerns that you may have at that time regarding the information given to you following your procedure. This is a courtesy call and so if there is no answer at the home number and we have not heard from you through the emergency physician on call, we will assume that you have returned to your regular daily activities without incident.  SIGNATURES/CONFIDENTIALITY: You and/or  your care partner have signed paperwork which will be entered into your electronic medical record.  These signatures attest to the fact that that the information above on your After Visit Summary has been reviewed and is understood.  Full responsibility of the confidentiality of this discharge information lies with you and/or your care-partner.

## 2011-08-05 NOTE — Op Note (Signed)
[facility] [address] [place], [place]  [id number]  COLONOSCOPY PROCEDURE REPORT  PATIENT:  Nathan Russo, Nathan Russo  MR#:  [id number] BIRTHDATE:  1940-11-01, 37 yrs. old  GENDER:  male ENDOSCOPIST:  Lowella Bandy. Olevia Perches, MD REF. BY: PROCEDURE DATE:  08/05/2011 PROCEDURE:  Colonoscopy [id number] ASA CLASS:  Class II INDICATIONS:  family history of colon cancer, history of pre-cancerous (adenomatous) colon polyps brother with colon canceraden. polyp 2004 last colon 2008 MEDICATIONS:   MAC sedation, administered by CRNA, propofol (Diprivan) 200 mg  DESCRIPTION OF PROCEDURE:   After the risks and benefits and of the procedure were explained, informed consent was obtained. Digital rectal exam was performed and revealed no rectal masses. The LB [id number] endoscope was introduced through the anus and advanced to the cecum, which was identified by both the appendix and ileocecal valve.  The quality of the prep was good, using MoviPrep.  The instrument was then slowly withdrawn as the colon was fully examined. <<PROCEDUREIMAGES>>  FINDINGS:  Moderate diverticulosis was found (see image4, image5, and image3). thick sigmoid folds, smaller lumen and spasm  This was otherwise a normal examination of the colon (see image6, image2, and image1).   Retroflexed views in the rectum revealed no abnormalities.    The scope was then withdrawn from the patient and the procedure completed.  COMPLICATIONS:  None ENDOSCOPIC IMPRESSION: 1) Moderate diverticulosis 2) Otherwise normal examination RECOMMENDATIONS: 1) High fiber diet. metamucil 1 tsp po qd  REPEAT EXAM:  In 5 year(s) for.  ______________________________ Lowella Bandy. Olevia Perches, MD  CC:  Biagio Borg, MD  n. Lorrin MaisLowella Bandy. Verta Riedlinger at 08/05/2011 03:42 PM  [name], [id number]

## 2011-08-05 NOTE — Progress Notes (Signed)
No complaints noted in the recovery room. Maw  Patient did not experience any of the following events: a burn prior to discharge; a fall within the facility; wrong site/side/patient/procedure/implant event; or a hospital transfer or hospital admission upon discharge from the facility. (G8907) Patient did not have preoperative order for IV antibiotic SSI prophylaxis. (G8918)  

## 2011-08-06 ENCOUNTER — Telehealth: Payer: Self-pay | Admitting: *Deleted

## 2011-08-06 LAB — GLUCOSE, CAPILLARY: Glucose-Capillary: 99 mg/dL (ref 70–99)

## 2011-08-06 NOTE — Telephone Encounter (Signed)
  Follow up Call-  Call back number 08/05/2011  Post procedure Call Back phone  # [phone]  Permission to leave phone message Yes     Patient questions:  Do you have a fever, pain , or abdominal swelling? no Pain Score  0 *  Have you tolerated food without any problems? yes  Have you been able to return to your normal activities? yes  Do you have any questions about your discharge instructions: Diet   no Medications  no Follow up visit  no  Do you have questions or concerns about your Care? no  Actions: * If pain score is 4 or above: No action needed, pain <4.

## 2011-08-20 DIAGNOSIS — C73 Malignant neoplasm of thyroid gland: Secondary | ICD-10-CM | POA: Diagnosis not present

## 2011-09-01 ENCOUNTER — Telehealth: Payer: Self-pay

## 2011-09-01 MED ORDER — OMEPRAZOLE 20 MG PO CPDR: 20.0000 mg | DELAYED_RELEASE_CAPSULE | Freq: Two times a day (BID) | ORAL | Status: DC

## 2011-09-01 NOTE — Telephone Encounter (Signed)
Done erx 

## 2011-09-01 NOTE — Telephone Encounter (Signed)
Patient requesting to take Omeprazole 20 mg BID.  Please advise as needs refill and is requesting #[phone]?

## 2011-09-03 ENCOUNTER — Other Ambulatory Visit (INDEPENDENT_AMBULATORY_CARE_PROVIDER_SITE_OTHER): Payer: Medicare Other

## 2011-09-03 ENCOUNTER — Ambulatory Visit (INDEPENDENT_AMBULATORY_CARE_PROVIDER_SITE_OTHER): Payer: Medicare Other | Admitting: Internal Medicine

## 2011-09-03 ENCOUNTER — Encounter: Payer: Self-pay | Admitting: Internal Medicine

## 2011-09-03 VITALS — BP 130/62 | HR 56 | Temp 97.5°F | Ht 72.0 in | Wt 217.4 lb

## 2011-09-03 DIAGNOSIS — F329 Major depressive disorder, single episode, unspecified: Secondary | ICD-10-CM

## 2011-09-03 DIAGNOSIS — I10 Essential (primary) hypertension: Secondary | ICD-10-CM

## 2011-09-03 DIAGNOSIS — E119 Type 2 diabetes mellitus without complications: Secondary | ICD-10-CM | POA: Diagnosis not present

## 2011-09-03 DIAGNOSIS — M25539 Pain in unspecified wrist: Secondary | ICD-10-CM | POA: Diagnosis not present

## 2011-09-03 DIAGNOSIS — M25531 Pain in right wrist: Secondary | ICD-10-CM | POA: Insufficient documentation

## 2011-09-03 DIAGNOSIS — G5600 Carpal tunnel syndrome, unspecified upper limb: Secondary | ICD-10-CM

## 2011-09-03 DIAGNOSIS — M25532 Pain in left wrist: Secondary | ICD-10-CM

## 2011-09-03 LAB — BASIC METABOLIC PANEL
BUN: 23 mg/dL (ref 6–23)
Creatinine, Ser: 1.1 mg/dL (ref 0.4–1.5)
GFR: 72.77 mL/min (ref >60.00)

## 2011-09-03 LAB — CBC WITH DIFFERENTIAL/PLATELET
Basophils Relative: 0.6 % (ref 0.0–3.0)
Eosinophils Relative: 3.2 % (ref 0.0–5.0)
HCT: 44.2 % (ref 39.0–52.0)
Hemoglobin: 14.7 g/dL (ref 13.0–17.0)
Lymphs Abs: 1.2 10*3/uL (ref 0.7–4.0)
MCV: 89.4 fl (ref 78.0–100.0)
Monocytes Absolute: 0.5 10*3/uL (ref 0.1–1.0)
Neutro Abs: 3.5 10*3/uL (ref 1.4–7.7)
Neutrophils Relative %: 64.7 % (ref 43.0–77.0)
RBC: 4.95 Mil/uL (ref 4.22–5.81)
WBC: 5.4 10*3/uL (ref 4.5–10.5)

## 2011-09-03 LAB — MICROALBUMIN / CREATININE URINE RATIO
Creatinine,U: 145.9 mg/dL
Microalb Creat Ratio: 0.4 mg/g (ref 0.0–30.0)
Microalb, Ur: 0.6 mg/dL (ref 0.0–1.9)

## 2011-09-03 LAB — HEPATIC FUNCTION PANEL: Total Bilirubin: 1.1 mg/dL (ref 0.3–1.2)

## 2011-09-03 MED ORDER — METFORMIN HCL ER 500 MG PO TB24: ORAL_TABLET | ORAL | Status: DC

## 2011-09-03 MED ORDER — MECLIZINE HCL 12.5 MG PO TABS: ORAL_TABLET | ORAL | Status: DC

## 2011-09-03 MED ORDER — ESCITALOPRAM OXALATE 10 MG PO TABS: 10.0000 mg | ORAL_TABLET | Freq: Every day | ORAL | Status: DC

## 2011-09-03 MED ORDER — DILTIAZEM HCL ER COATED BEADS 120 MG PO CP24: 120.0000 mg | ORAL_CAPSULE | Freq: Every day | ORAL | Status: DC

## 2011-09-03 MED ORDER — LEVOTHYROXINE SODIUM 200 MCG PO TABS: 200.0000 ug | ORAL_TABLET | Freq: Every day | ORAL | Status: DC

## 2011-09-03 MED ORDER — BENAZEPRIL HCL 40 MG PO TABS: 40.0000 mg | ORAL_TABLET | Freq: Every day | ORAL | Status: DC

## 2011-09-03 NOTE — Addendum Note (Signed)
Addended by: Sharon Seller B on: 09/03/2011 09:53 AM   Modules accepted: Orders

## 2011-09-03 NOTE — Assessment & Plan Note (Signed)
stable overall by hx and exam, most recent data reviewed with pt, and pt to continue medical treatment as before BP Readings from Last 3 Encounters:  09/03/11 130/62  08/05/11 136/78  03/06/11 120/78

## 2011-09-03 NOTE — Assessment & Plan Note (Signed)
Mld to mod ongoing, gave xtra large bilat wrist splint , to f/u hand surgury as planned

## 2011-09-03 NOTE — Addendum Note (Signed)
Addended by: Sharon Seller B on: 09/03/2011 09:50 AM   Modules accepted: Orders

## 2011-09-03 NOTE — Assessment & Plan Note (Signed)
Ok to change the zoloft to lexapro 10 qd, then if better might consider further tx per card such as ablation per pt
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
## 2011-09-03 NOTE — Patient Instructions (Signed)
Take all new medications as prescribed - the lexapro 10 mg per day Continue all other medications as before, except ok to stop the zoloft Please have the pharmacy call with any refills you may need. You are given the wrist splints today Please keep your appointments with your specialists as you have planned Please go to LAB in the Basement for the blood and/or urine tests to be done today You will be contacted by phone if any changes need to be made immediately.  Otherwise, you will receive a letter about your results with an explanation. Please return in 6 months, or sooner if needed

## 2011-09-03 NOTE — Assessment & Plan Note (Signed)
stable overall by hx and exam, most recent data reviewed with pt, and pt to continue medical treatment as before Lab Results  Component Value Date   HGBA1C 6.2 12/13/2010

## 2011-09-04 ENCOUNTER — Encounter: Payer: Self-pay | Admitting: Internal Medicine

## 2011-09-14 DIAGNOSIS — I10 Essential (primary) hypertension: Secondary | ICD-10-CM | POA: Diagnosis not present

## 2011-09-14 DIAGNOSIS — R002 Palpitations: Secondary | ICD-10-CM | POA: Diagnosis not present

## 2011-09-14 DIAGNOSIS — R079 Chest pain, unspecified: Secondary | ICD-10-CM | POA: Diagnosis not present

## 2011-09-14 DIAGNOSIS — E119 Type 2 diabetes mellitus without complications: Secondary | ICD-10-CM | POA: Diagnosis not present

## 2011-09-15 DIAGNOSIS — M5137 Other intervertebral disc degeneration, lumbosacral region: Secondary | ICD-10-CM | POA: Diagnosis not present

## 2011-09-15 DIAGNOSIS — M5030 Other cervical disc degeneration, unspecified cervical region: Secondary | ICD-10-CM | POA: Diagnosis not present

## 2011-09-15 DIAGNOSIS — S139XXA Sprain of joints and ligaments of unspecified parts of neck, initial encounter: Secondary | ICD-10-CM | POA: Diagnosis not present

## 2011-09-15 DIAGNOSIS — S335XXA Sprain of ligaments of lumbar spine, initial encounter: Secondary | ICD-10-CM | POA: Diagnosis not present

## 2011-09-15 DIAGNOSIS — M545 Low back pain: Secondary | ICD-10-CM | POA: Diagnosis not present

## 2011-09-21 ENCOUNTER — Other Ambulatory Visit: Payer: Self-pay | Admitting: Internal Medicine

## 2011-09-24 DIAGNOSIS — M542 Cervicalgia: Secondary | ICD-10-CM | POA: Diagnosis not present

## 2011-10-06 DIAGNOSIS — M542 Cervicalgia: Secondary | ICD-10-CM | POA: Diagnosis not present

## 2011-10-08 DIAGNOSIS — I499 Cardiac arrhythmia, unspecified: Secondary | ICD-10-CM | POA: Diagnosis not present

## 2011-10-08 DIAGNOSIS — I4460 Unspecified fascicular block: Secondary | ICD-10-CM | POA: Diagnosis not present

## 2011-10-31 DIAGNOSIS — M545 Low back pain: Secondary | ICD-10-CM | POA: Diagnosis not present

## 2011-11-12 DIAGNOSIS — M542 Cervicalgia: Secondary | ICD-10-CM | POA: Diagnosis not present

## 2011-11-12 DIAGNOSIS — M545 Low back pain: Secondary | ICD-10-CM | POA: Diagnosis not present

## 2011-11-18 DIAGNOSIS — M25519 Pain in unspecified shoulder: Secondary | ICD-10-CM | POA: Diagnosis not present

## 2011-12-04 DIAGNOSIS — M542 Cervicalgia: Secondary | ICD-10-CM | POA: Diagnosis not present

## 2011-12-04 DIAGNOSIS — H832X9 Labyrinthine dysfunction, unspecified ear: Secondary | ICD-10-CM | POA: Diagnosis not present

## 2011-12-04 DIAGNOSIS — G4489 Other headache syndrome: Secondary | ICD-10-CM | POA: Diagnosis not present

## 2011-12-05 DIAGNOSIS — M25519 Pain in unspecified shoulder: Secondary | ICD-10-CM | POA: Diagnosis not present

## 2011-12-08 ENCOUNTER — Other Ambulatory Visit: Payer: Self-pay | Admitting: Internal Medicine

## 2011-12-17 DIAGNOSIS — M25519 Pain in unspecified shoulder: Secondary | ICD-10-CM | POA: Diagnosis not present

## 2011-12-29 DIAGNOSIS — L259 Unspecified contact dermatitis, unspecified cause: Secondary | ICD-10-CM | POA: Diagnosis not present

## 2012-01-05 DIAGNOSIS — Z23 Encounter for immunization: Secondary | ICD-10-CM | POA: Diagnosis not present

## 2012-01-16 DIAGNOSIS — M47812 Spondylosis without myelopathy or radiculopathy, cervical region: Secondary | ICD-10-CM | POA: Diagnosis not present

## 2012-01-16 DIAGNOSIS — G4489 Other headache syndrome: Secondary | ICD-10-CM | POA: Diagnosis not present

## 2012-01-16 DIAGNOSIS — M542 Cervicalgia: Secondary | ICD-10-CM | POA: Diagnosis not present

## 2012-01-16 DIAGNOSIS — M25519 Pain in unspecified shoulder: Secondary | ICD-10-CM | POA: Diagnosis not present

## 2012-01-21 DIAGNOSIS — M75120 Complete rotator cuff tear or rupture of unspecified shoulder, not specified as traumatic: Secondary | ICD-10-CM | POA: Diagnosis not present

## 2012-01-21 DIAGNOSIS — M19019 Primary osteoarthritis, unspecified shoulder: Secondary | ICD-10-CM | POA: Diagnosis not present

## 2012-01-28 DIAGNOSIS — M542 Cervicalgia: Secondary | ICD-10-CM | POA: Diagnosis not present

## 2012-01-30 DIAGNOSIS — M542 Cervicalgia: Secondary | ICD-10-CM | POA: Diagnosis not present

## 2012-02-09 DIAGNOSIS — M542 Cervicalgia: Secondary | ICD-10-CM | POA: Diagnosis not present

## 2012-02-11 DIAGNOSIS — M542 Cervicalgia: Secondary | ICD-10-CM | POA: Diagnosis not present

## 2012-02-16 DIAGNOSIS — M542 Cervicalgia: Secondary | ICD-10-CM | POA: Diagnosis not present

## 2012-02-20 DIAGNOSIS — M542 Cervicalgia: Secondary | ICD-10-CM | POA: Diagnosis not present

## 2012-02-23 DIAGNOSIS — M542 Cervicalgia: Secondary | ICD-10-CM | POA: Diagnosis not present

## 2012-02-25 ENCOUNTER — Ambulatory Visit (INDEPENDENT_AMBULATORY_CARE_PROVIDER_SITE_OTHER): Payer: Medicare Other | Admitting: Internal Medicine

## 2012-02-25 ENCOUNTER — Other Ambulatory Visit (INDEPENDENT_AMBULATORY_CARE_PROVIDER_SITE_OTHER): Payer: Medicare Other

## 2012-02-25 ENCOUNTER — Encounter: Payer: Self-pay | Admitting: Internal Medicine

## 2012-02-25 VITALS — BP 132/80 | HR 86 | Temp 98.2°F | Ht 72.0 in | Wt 209.0 lb

## 2012-02-25 DIAGNOSIS — E119 Type 2 diabetes mellitus without complications: Secondary | ICD-10-CM | POA: Diagnosis not present

## 2012-02-25 DIAGNOSIS — E039 Hypothyroidism, unspecified: Secondary | ICD-10-CM | POA: Diagnosis not present

## 2012-02-25 DIAGNOSIS — F329 Major depressive disorder, single episode, unspecified: Secondary | ICD-10-CM

## 2012-02-25 DIAGNOSIS — I10 Essential (primary) hypertension: Secondary | ICD-10-CM

## 2012-02-25 LAB — LIPID PANEL: Cholesterol: 163 mg/dL (ref 0–200)

## 2012-02-25 LAB — TSH: TSH: 0.03 u[IU]/mL — ABNORMAL LOW (ref 0.35–5.50)

## 2012-02-25 LAB — BASIC METABOLIC PANEL
BUN: 23 mg/dL (ref 6–23)
Chloride: 105 mEq/L (ref 96–112)
Glucose, Bld: 136 mg/dL — ABNORMAL HIGH (ref 70–99)
Potassium: 4 mEq/L (ref 3.5–5.1)

## 2012-02-25 LAB — HEPATIC FUNCTION PANEL
ALT: 28 U/L (ref 0–53)
AST: 21 U/L (ref 0–37)
Total Protein: 7.2 g/dL (ref 6.0–8.3)

## 2012-02-25 MED ORDER — OMEPRAZOLE 20 MG PO CPDR: 20.0000 mg | DELAYED_RELEASE_CAPSULE | Freq: Two times a day (BID) | ORAL | Status: DC

## 2012-02-25 MED ORDER — SERTRALINE HCL 100 MG PO TABS: 100.0000 mg | ORAL_TABLET | Freq: Every day | ORAL | Status: DC

## 2012-02-25 MED ORDER — METFORMIN HCL ER 500 MG PO TB24: ORAL_TABLET | ORAL | Status: DC

## 2012-02-25 MED ORDER — BENAZEPRIL HCL 40 MG PO TABS: 40.0000 mg | ORAL_TABLET | Freq: Every day | ORAL | Status: DC

## 2012-02-25 MED ORDER — TRAMADOL HCL 50 MG PO TABS: 50.0000 mg | ORAL_TABLET | Freq: Four times a day (QID) | ORAL | Status: DC | PRN

## 2012-02-25 MED ORDER — MECLIZINE HCL 12.5 MG PO TABS: ORAL_TABLET | ORAL | Status: DC

## 2012-02-25 MED ORDER — DILTIAZEM HCL ER COATED BEADS 120 MG PO CP24: 120.0000 mg | ORAL_CAPSULE | Freq: Every day | ORAL | Status: DC

## 2012-02-25 NOTE — Patient Instructions (Addendum)
OK to decrease the metformin to 500 mg three times per day  Continue all other medications as before [facility] You may wish to ask your endocrinologist about the TSH being about 0, and the relation to the heart rhythm risk Please go to LAB in the Basement for the blood and/or urine tests to be done today  You will be contacted by phone if any changes need to be made immediately.  Otherwise, you will receive a letter about your results with an explanation, but please check with MyChart first. Thank you for enrolling in [facility]. Please follow the instructions below to securely access your online medical record. MyChart allows you to send messages to your doctor, view your test results, renew your prescriptions, schedule appointments, and more. To Log into MyChart, please go to [email or web address], and your Username is: [email or web address], password - [email or web address] Please return in 6 months, or sooner if needed

## 2012-02-25 NOTE — Assessment & Plan Note (Signed)
stable overall by hx and exam, most recent data reviewed with pt, and pt to continue medical treatment as before BP Readings from Last 3 Encounters:  02/25/12 132/80  09/03/11 130/62  08/05/11 136/78
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
## 2012-02-25 NOTE — Assessment & Plan Note (Signed)
With wt and a1c improving over the past yr, ok to decrease the metformin to tid (from 4 per day) Lab Results  Component Value Date   HGBA1C 6.2 09/03/2011

## 2012-02-25 NOTE — Assessment & Plan Note (Signed)
Although really followed by his endo, pt requests tsh today,  to f/u any worsening symptoms or concerns

## 2012-02-25 NOTE — Assessment & Plan Note (Signed)
Some increased recently I think related to his chronic pain and worsening gait impairment, declines need for change in tx today

## 2012-02-27 ENCOUNTER — Other Ambulatory Visit: Payer: Self-pay

## 2012-02-27 DIAGNOSIS — M542 Cervicalgia: Secondary | ICD-10-CM | POA: Diagnosis not present

## 2012-02-27 MED ORDER — TRAMADOL HCL 50 MG PO TABS: 50.0000 mg | ORAL_TABLET | Freq: Four times a day (QID) | ORAL | Status: DC | PRN

## 2012-02-27 MED ORDER — LEVOTHYROXINE SODIUM 200 MCG PO TABS: 200.0000 ug | ORAL_TABLET | Freq: Every day | ORAL | Status: DC

## 2012-02-27 MED ORDER — SERTRALINE HCL 100 MG PO TABS: 100.0000 mg | ORAL_TABLET | Freq: Every day | ORAL | Status: DC

## 2012-02-27 MED ORDER — DILTIAZEM HCL ER COATED BEADS 120 MG PO CP24: 120.0000 mg | ORAL_CAPSULE | Freq: Every day | ORAL | Status: DC

## 2012-02-27 MED ORDER — OMEPRAZOLE 20 MG PO CPDR: 20.0000 mg | DELAYED_RELEASE_CAPSULE | Freq: Two times a day (BID) | ORAL | Status: DC

## 2012-02-27 NOTE — Telephone Encounter (Signed)
Pharmacy is also requesting 50 mcg of Synthroid sent in as well, but do not see on current list.

## 2012-02-27 NOTE — Telephone Encounter (Signed)
Done erx 

## 2012-03-01 DIAGNOSIS — M542 Cervicalgia: Secondary | ICD-10-CM | POA: Diagnosis not present

## 2012-03-09 DIAGNOSIS — M542 Cervicalgia: Secondary | ICD-10-CM | POA: Diagnosis not present

## 2012-03-12 DIAGNOSIS — M542 Cervicalgia: Secondary | ICD-10-CM | POA: Diagnosis not present

## 2012-03-16 DIAGNOSIS — M542 Cervicalgia: Secondary | ICD-10-CM | POA: Diagnosis not present

## 2012-03-17 DIAGNOSIS — Z85850 Personal history of malignant neoplasm of thyroid: Secondary | ICD-10-CM | POA: Diagnosis not present

## 2012-03-17 DIAGNOSIS — C73 Malignant neoplasm of thyroid gland: Secondary | ICD-10-CM | POA: Diagnosis not present

## 2012-03-17 DIAGNOSIS — I10 Essential (primary) hypertension: Secondary | ICD-10-CM | POA: Diagnosis not present

## 2012-03-17 DIAGNOSIS — M542 Cervicalgia: Secondary | ICD-10-CM | POA: Diagnosis not present

## 2012-03-17 DIAGNOSIS — G4489 Other headache syndrome: Secondary | ICD-10-CM | POA: Diagnosis not present

## 2012-03-17 DIAGNOSIS — R42 Dizziness and giddiness: Secondary | ICD-10-CM | POA: Diagnosis not present

## 2012-03-19 DIAGNOSIS — N401 Enlarged prostate with lower urinary tract symptoms: Secondary | ICD-10-CM | POA: Diagnosis not present

## 2012-03-27 ENCOUNTER — Ambulatory Visit (INDEPENDENT_AMBULATORY_CARE_PROVIDER_SITE_OTHER): Payer: Medicare Other | Admitting: Internal Medicine

## 2012-03-27 ENCOUNTER — Encounter: Payer: Self-pay | Admitting: Internal Medicine

## 2012-03-27 VITALS — BP 168/90 | HR 100 | Temp 97.9°F | Wt 226.0 lb

## 2012-03-27 DIAGNOSIS — R030 Elevated blood-pressure reading, without diagnosis of hypertension: Secondary | ICD-10-CM | POA: Diagnosis not present

## 2012-03-27 DIAGNOSIS — I4891 Unspecified atrial fibrillation: Secondary | ICD-10-CM | POA: Diagnosis not present

## 2012-03-27 DIAGNOSIS — E039 Hypothyroidism, unspecified: Secondary | ICD-10-CM

## 2012-03-27 DIAGNOSIS — I10 Essential (primary) hypertension: Secondary | ICD-10-CM | POA: Diagnosis not present

## 2012-03-27 DIAGNOSIS — E119 Type 2 diabetes mellitus without complications: Secondary | ICD-10-CM

## 2012-03-27 MED ORDER — DILTIAZEM HCL ER COATED BEADS 120 MG PO CP24: 120.0000 mg | ORAL_CAPSULE | Freq: Two times a day (BID) | ORAL | Status: DC

## 2012-03-27 MED ORDER — RIVAROXABAN 20 MG PO TABS: 20.0000 mg | ORAL_TABLET | Freq: Every day | ORAL | Status: DC

## 2012-03-27 NOTE — Progress Notes (Signed)
  Subjective:    Patient ID: Nathan Russo, male    DOB: 14-Mar-1940, 72 y.o.   MRN: [id number]  HPI  C/o elevated BP and he was told on 1/16 that he had A fib. He was at the surgical center to have R rot cuff repair and was sent home. His HR was 100 He is on Levothyroxine 250 mcg a day. His TSH was low 1 month ago... No CP or SOB  Review of Systems  Constitutional: Negative for appetite change, fatigue and unexpected weight change.  HENT: Negative for nosebleeds, congestion, sore throat, sneezing, trouble swallowing and neck pain.   Eyes: Negative for itching and visual disturbance.  Respiratory: Negative for cough, shortness of breath and wheezing.   Cardiovascular: Negative for chest pain, palpitations and leg swelling.  Gastrointestinal: Negative for nausea, vomiting, diarrhea, blood in stool and abdominal distention.  Genitourinary: Negative for frequency and hematuria.  Musculoskeletal: Negative for back pain, joint swelling and gait problem.  Skin: Negative for rash.  Neurological: Negative for dizziness, tremors, speech difficulty and weakness.  Psychiatric/Behavioral: Negative for hallucinations, sleep disturbance, self-injury, dysphoric mood and agitation. The patient is not nervous/anxious.        Objective:   Physical Exam  Constitutional: He is oriented to person, place, and time. He appears well-developed.  HENT:  Mouth/Throat: Oropharynx is clear and moist.  Eyes: Conjunctivae normal are normal. Pupils are equal, round, and reactive to light.  Neck: Normal range of motion. No JVD present. No thyromegaly present.  Cardiovascular: Normal rate, normal heart sounds and intact distal pulses.  Exam reveals no gallop and no friction rub.   No murmur heard.      Irregular rhythm  Pulmonary/Chest: Effort normal and breath sounds normal. No respiratory distress. He has no wheezes. He has no rales. He exhibits no tenderness.  Abdominal: Soft. Bowel sounds are normal. He exhibits  no distension and no mass. There is no tenderness. There is no rebound and no guarding.  Musculoskeletal: Normal range of motion. He exhibits no edema and no tenderness.  Lymphadenopathy:    He has no cervical adenopathy.  Neurological: He is alert and oriented to person, place, and time. He has normal reflexes. No cranial nerve deficit. He exhibits normal muscle tone. Coordination normal.  Skin: Skin is warm and dry. No rash noted.  Psychiatric: He has a normal mood and affect. His behavior is normal. Judgment and thought content normal.    EKG Chart, labs reviewed  A complex case      Assessment & Plan:

## 2012-03-27 NOTE — Patient Instructions (Signed)
Hold Levothyroxine for 5 days, then re-start at 200 mcg a day

## 2012-03-28 DIAGNOSIS — I10 Essential (primary) hypertension: Secondary | ICD-10-CM | POA: Insufficient documentation

## 2012-03-28 NOTE — Assessment & Plan Note (Signed)
Hold Levothyroxine, then reduce dose

## 2012-03-28 NOTE — Assessment & Plan Note (Signed)
Hold Levothyroxine, then reduce dose Increase Diltiazem to bid

## 2012-03-28 NOTE — Assessment & Plan Note (Signed)
1/14 new Discussed Hold Levothyroxine, then reduce dose Card Consult next week Start Xarelto Increase diltiazem to bid F/u w/Dr Jenny Reichmann in 2 wks w/labs

## 2012-03-28 NOTE — Assessment & Plan Note (Signed)
Continue with current prescription therapy as reflected on the Med list.  

## 2012-03-29 ENCOUNTER — Telehealth: Payer: Self-pay | Admitting: Internal Medicine

## 2012-03-29 NOTE — Telephone Encounter (Signed)
Pt saw Dr. Alain Marion 03/27/12

## 2012-03-29 NOTE — Telephone Encounter (Signed)
Patient Information:  Caller Name: [place]  Phone: [phone]  Patient: Nathan Russo, Nathan Russo  Gender: Male  DOB: 07/10/1940  Age: 72 Years  PCP: Cathlean Cower (Adults only)  Office Follow Up:  Does the office need to follow up with this patient?: Yes  Instructions For The Office: Please send in Coverage Determination form regarding his Diltiazem and notify patient when this has been done.  Thank you.   Symptoms  Reason For Call & Symptoms: Patient requests note from MD regarding his Cardizem CD.  States it is not covered by insurance.  [employer].  Phone [phone].  Pharmacy Help Desk for providers  [phone] .  Address [id number], [place], [place] [address].  He was informed that there was a charge in January 2014.  "Coverage Determination"  form is requested to get the following medication covered by his insurance for the following medication (copied from Dr. Judeen Hammans notes on 03/27/12)  "diltiazem (CARDIZEM CD) 120 MG 24 hr capsule 180 capsule 3 03/27/2012 03/27/2013    Take 1 capsule (120 mg total) by mouth 2 (two) times daily. - Oral"  Patient asks for confirmation phone call informing him when this has been done.  Reviewed Health History In EMR: Yes  Reviewed Medications In EMR: Yes  Reviewed Allergies In EMR: Yes  Reviewed Surgeries / Procedures: Yes  Date of Onset of Symptoms: 03/27/2012  Guideline(s) Used:  No Protocol Available - Sick Adult  Disposition Per Guideline:   Discuss with PCP and Callback by Nurse Today  Reason For Disposition Reached:   Nursing judgment  Advice Given:  N/A

## 2012-03-29 NOTE — Telephone Encounter (Signed)
[facility] Triage Call Report Triage Record Num: [id number] Operator: April Finney Patient Name: Nathan Russo Call Date & Time: 03/27/2012 8:50:41AM Patient Phone: [phone] PCP: Cathlean Cower Patient Gender: Male PCP Fax : [phone] Patient DOB: 09-15-1940 Practice Name: [facility] Reason for Call: Caller: [place]/Patient; PCP: Cathlean Cower (Adults only); CB#: [phone]; Call regarding BP Up and Down; B/P 169/118 and HR 97. Was told last week he has afib while having a procedure and has not been able to get an appt for follow up. No chest pain. No sob. Heart rate staying under 100. He has taken his B/P several times: 150/110 160/91 162/106 151/114 173/113. No emergent symptoms. See in 24 hrs care advice given. Appt scheduled for today at 10:45am. Protocol(s) Used: Hypertension, Diagnosed or Suspected Recommended Outcome per Protocol: See Provider within 24 hours Reason for Outcome: A sudden elevation in blood pressure AND has not been taking blood pressure medication as prescribed, or recently started, changed, or stopped taking prescription medication; or recently started taking nonprescription or alternative medicine Care Advice: Call EMS 911 if new symptoms develop, such as severe shortness of breath, chest pain, change in mental status, acute neurologic deficit, seizure, visual disturbances, pulse rate > 120 / minute, or very irregular pulse. ~ Call provider if systolic BP is 99991111 or greater, or if diastolic BP is 123456 or greater. ~ HEALTH PROMOTION / MAINTENANCE ~ SYMPTOM / CONDITION MANAGEMENT ~ CAUTIONS ~ List, or take, all current prescription(s), nonprescription or alternative medication(s) to provider for evaluation. Medication Advice: - Discontinue all nonprescription and alternative medications, especially stimulants, until evaluated by provider. - Take prescribed medications as directed, following label instructions for the medication. - Do not change medications  or dosing regimen until provider is consulted. - Know possible side effects of medication and what to do if they occur. - Tell provider all prescription, nonprescription or alternative medications that you take

## 2012-03-29 NOTE — Telephone Encounter (Signed)
Saw dr Alain Marion jan 18  noted

## 2012-03-30 NOTE — Telephone Encounter (Signed)
Called the patient to inform will do PA on this medication and call once informed of result.

## 2012-03-31 ENCOUNTER — Telehealth: Payer: Self-pay

## 2012-03-31 DIAGNOSIS — I498 Other specified cardiac arrhythmias: Secondary | ICD-10-CM | POA: Diagnosis not present

## 2012-03-31 DIAGNOSIS — I499 Cardiac arrhythmia, unspecified: Secondary | ICD-10-CM | POA: Diagnosis not present

## 2012-03-31 DIAGNOSIS — I4891 Unspecified atrial fibrillation: Secondary | ICD-10-CM | POA: Diagnosis not present

## 2012-03-31 NOTE — Telephone Encounter (Signed)
Received PA Approval for the patients Cardizem  From 01/01/2012 through 03/10/2023.  Patient has been informed.  Put copy of approval letter in [facility] folder and one to scan.

## 2012-04-01 ENCOUNTER — Ambulatory Visit: Payer: Medicare Other | Admitting: Physician Assistant

## 2012-04-01 DIAGNOSIS — M19019 Primary osteoarthritis, unspecified shoulder: Secondary | ICD-10-CM | POA: Diagnosis not present

## 2012-04-07 DIAGNOSIS — N401 Enlarged prostate with lower urinary tract symptoms: Secondary | ICD-10-CM | POA: Diagnosis not present

## 2012-04-07 DIAGNOSIS — R972 Elevated prostate specific antigen [PSA]: Secondary | ICD-10-CM | POA: Diagnosis not present

## 2012-04-08 DIAGNOSIS — L570 Actinic keratosis: Secondary | ICD-10-CM | POA: Diagnosis not present

## 2012-04-08 DIAGNOSIS — B353 Tinea pedis: Secondary | ICD-10-CM | POA: Diagnosis not present

## 2012-04-08 DIAGNOSIS — L259 Unspecified contact dermatitis, unspecified cause: Secondary | ICD-10-CM | POA: Diagnosis not present

## 2012-04-14 ENCOUNTER — Encounter: Payer: Self-pay | Admitting: Internal Medicine

## 2012-04-14 ENCOUNTER — Ambulatory Visit (INDEPENDENT_AMBULATORY_CARE_PROVIDER_SITE_OTHER): Payer: Medicare Other | Admitting: Internal Medicine

## 2012-04-14 VITALS — BP 112/82 | HR 104 | Temp 97.9°F | Ht 72.0 in | Wt 215.4 lb

## 2012-04-14 DIAGNOSIS — I4891 Unspecified atrial fibrillation: Secondary | ICD-10-CM

## 2012-04-14 DIAGNOSIS — I10 Essential (primary) hypertension: Secondary | ICD-10-CM | POA: Diagnosis not present

## 2012-04-14 DIAGNOSIS — E119 Type 2 diabetes mellitus without complications: Secondary | ICD-10-CM

## 2012-04-14 DIAGNOSIS — C73 Malignant neoplasm of thyroid gland: Secondary | ICD-10-CM

## 2012-04-14 MED ORDER — LEVOTHYROXINE SODIUM 200 MCG PO TABS: 200.0000 ug | ORAL_TABLET | Freq: Every day | ORAL | Status: DC

## 2012-04-14 NOTE — Patient Instructions (Addendum)
Please continue all other medications as before, and refills have been done if requested. Please have the pharmacy call with any other refills you may need. Please plan to return for LAB ONLY  - Feb 18 at [facility] lab for the thyroid studies Please keep your appointments with your specialists as you have planned - Feb 20 cardioversion, and f/u with Dr Owens Shark at [facility] in June You will be contacted regarding the referral for: Dr Loanne Drilling for after the cardioversion Thank you for enrolling in [facility]. Please follow the instructions below to securely access your online medical record. MyChart allows you to send messages to your doctor, view your test results, renew your prescriptions, schedule appointments, and more.  To Log into MyChart, please go to [email or web address], and your Username is: [email or web address], password - [email or web address]  Note: Dr Liliane Channel Henderson/cardiology - [place] [phone] (phone) - nurse = kim  Please return in 6 months, or sooner if needed

## 2012-04-14 NOTE — Progress Notes (Signed)
Subjective:    Patient ID: Nathan Russo, male    DOB: 12/15/1940, 72 y.o.   MRN: [id number]  HPI Here to f/u; mentions he was having right shoulder procedure on a thur 2 wks ago, with preop ecg c/w afib per anesthesia, surgury postponed, had been on 250 total levothyroxine s/p thyroid cancer surgury per Dr Jeneen Rinks [facility]; then had Radioactive iodine with a few days quarantine at home  - march 2013;  Recent TSH here September 03 2011 with TSH at zero, same dose cont'd, then did well until finding of asympt afib preop as Saw Dr Alain Marion with reduction in levothyroxine to 200, increase in diltiazem and start xarelto, . Unable to see Dr Aundra Dubin quickly per pt/lebuaer, so saw Dr Nettie Elm at [facility]/card who changed the xarelto to eliquis which may have less risk of major bleeding. Last TSH here TSH .03 on dec 2013.  For cardioversion scheduled feb 20, pt not sure if had echo.  May eventually have right shoulder re-scheduled. Has f/u scheduled with Dr Owens Shark about June 2014, last visit about dec 2013.  Has not seen Dr Loanne Drilling in some time. Past Medical History  Diagnosis Date  . THYROID NODULE 02/07/2010  . DIABETES MELLITUS, TYPE II 04/28/2007  . HYPERLIPIDEMIA 04/28/2007  . OBESITY 10/24/2006  . CARPAL TUNNEL SYNDROME, BILATERAL 02/07/2009  . OTITIS MEDIA, ACUTE, LEFT 12/26/2009  . HYPERTENSION 10/21/2006  . BRADYCARDIA, CHRONIC 10/24/2006  . GERD 02/11/2009  . PHIMOSIS 02/07/2009  . DEGENERATIVE JOINT DISEASE, RIGHT KNEE 10/24/2006  . Dizziness and giddiness 12/19/2009  . FATIGUE 04/28/2007  . Headache 12/19/2009  . NECK MASS 02/07/2010  . CHEST PAIN-UNSPECIFIED 09/05/2008  . COLONIC POLYPS, HX OF 04/28/2007  . Abdominal pain, epigastric 04/11/2010  . CHOLELITHIASIS 04/22/2010  . BELCHING 04/23/2010  . Thyroid cancer 06/13/2010  . Cholelithiasis 06/13/2010  . Elevated PSA 06/13/2010  . S/P laparoscopic cholecystectomy 10/31/2010  . Depression 02/23/2011   Past Surgical History  Procedure Date  .  Left knee surgery   . Right wrist surgury   . Cholecystectomy   . Thyroid surgery     reports that he has quit smoking. He has never used smokeless tobacco. He reports that he does not drink alcohol or use illicit drugs. family history includes Arthritis in his mother; Cancer in his brother and sister; Colon cancer (age of onset:60) in his brother; Diabetes in his brother; Goiter in his mother; Heart attack (age of onset:77) in his brother; Heart attack (age of onset:88) in his father; Hypertension in his mother; and Hypothyroidism in his sister. Allergies  Allergen Reactions  . Ace Inhibitors     REACTION: cough  . Atenolol     REACTION: bradycardia  . Codeine Other (See Comments)    "wild"  . Lovastatin     REACTION: myalygros  . Sulfa Antibiotics Other (See Comments)    As child almost died   Current Outpatient Prescriptions on File Prior to Visit  Medication Sig Dispense Refill  . apixaban (ELIQUIS) 5 MG TABS tablet Take by mouth 2 (two) times daily.      . Ascorbic Acid (VITAMIN C) 1000 MG tablet Take 1,000 mg by mouth daily.        [name] aspirin 81 MG EC tablet Take 81 mg by mouth daily.        . benazepril (LOTENSIN) 40 MG tablet Take 1 tablet (40 mg total) by mouth daily.  90 tablet  3  . Blood Glucose Monitoring Suppl (ACCU-CHEK  COMPACT CARE KIT) KIT by Does not apply route.        . calcium carbonate (OS-CAL) 600 MG TABS Take 600 mg by mouth daily.        . Cinnamon 500 MG capsule Take 500 mg by mouth 2 (two) times daily.        [name] diltiazem (CARDIZEM CD) 120 MG 24 hr capsule Take 1 capsule (120 mg total) by mouth 2 (two) times daily.  180 capsule  3  . Flaxseed, Linseed, 1000 MG CAPS Take 1 capsule by mouth daily.      [name] gabapentin (NEURONTIN) 300 MG capsule Take 300 mg by mouth 3 (three) times daily.      . Garlic Oil (ODORLESS GARLIC) [id number] MG TABS Take by mouth 2 (two) times daily.        . Ginger, Zingiber officinalis, (GINGER PO) Take by mouth 4 (four) times daily.        .  Glucosamine-Chondroit-Vit C-Mn (GLUCOSAMINE CHONDROITIN COMPLX) CAPS Take by mouth daily.        . Glucose Blood (ACCU-CHEK INSTANT GLUCOSE TEST VI) by In Vitro route 4 (four) times daily.        [name] levothyroxine (SYNTHROID, LEVOTHROID) 200 MCG tablet Take 1 tablet (200 mcg total) by mouth daily. 250 mcg  90 tablet  3  . meclizine (ANTIVERT) 12.5 MG tablet Take 1 tablet by mouth every 6 hours as needed.  100 tablet  1  . metFORMIN (GLUCOPHAGE-XR) 500 MG 24 hr tablet Take 1 tablet three times per day  270 tablet  3  . Multiple Vitamin (MULTIVITAMIN) capsule Take 1 capsule by mouth daily.        . nabumetone (RELAFEN) 750 MG tablet Take 750 mg by mouth daily.      . Omega-3 Fatty Acids (FISH OIL) 1000 MG CAPS Take by mouth 2 (two) times daily.        [name] omeprazole (PRILOSEC) 20 MG capsule Take 1 capsule (20 mg total) by mouth 2 (two) times daily.  180 capsule  3  . sertraline (ZOLOFT) 100 MG tablet Take 1 tablet (100 mg total) by mouth daily.  90 tablet  3  . traMADol (ULTRAM) 50 MG tablet Take 1 tablet (50 mg total) by mouth every 6 (six) hours as needed. Maximum dose= 8 tablets per day  120 tablet  2  . VITAMIN D, CHOLECALCIFEROL, PO Take by mouth daily.         Review of Systems  Constitutional: Negative for unexpected weight change, or unusual diaphoresis  HENT: Negative for tinnitus.   Eyes: Negative for photophobia and visual disturbance.  Respiratory: Negative for choking and stridor.   Gastrointestinal: Negative for vomiting and blood in stool.  Genitourinary: Negative for hematuria and decreased urine volume.  Musculoskeletal: Negative for acute joint swelling Skin: Negative for color change and wound.  Neurological: Negative for tremors and numbness other than noted  Psychiatric/Behavioral: Negative for decreased concentration or  hyperactivity.      Objective:   Physical Exam BP 112/82  Pulse 104  Temp 97.9 F (36.6 C) (Oral)  Ht 6' (1.829 m)  Wt 215 lb 6 oz (97.693 kg)  BMI  29.21 kg/m2  SpO2 92% VS noted,  Not ill appearing Constitutional: Pt appears well-developed and well-nourished.  HENT: Head: NCAT.  Right Ear: External ear normal.  Left Ear: External ear normal.  Eyes: Conjunctivae and EOM are normal. Pupils are equal, round, and reactive to light.  Neck: Normal range  of motion. Neck supple.  Cardiovascular: Normal rate and irreg irregular rhythm.   Pulmonary/Chest: Effort normal and breath sounds normal.  S/p thyroidectomy, no neck LA or tenderness Neurological: Pt is alert. Not confused  Skin: Skin is warm. No erythema.  Psychiatric: Pt behavior is normal. Thought content normal.     Assessment & Plan:

## 2012-04-14 NOTE — Assessment & Plan Note (Addendum)
S/p thyroidectomy and rad iodine tx about mar 2013, on suppressive tx at 250 qd since then, complicated unfortunately by new onset afib with reduction in suppressive tx to 200 approx 2 wks ago, with afib o/w relatively well tolerated on current meds including rate control and eliquis, without volume increase or overt bleeding/bruising; d/w pt that tx certainly seems appropriate and he is o/w stable to day, no evidence on PE of recurrent dz, to cont current suppressive dosing at 200 qd with plan for f/u TSH/t4 on feb 18, and will try to forward results to cardiology in time for purpose of being aware at time of cardioversion feb 20; will also refer back to Dr Loanne Drilling for ongoing med f/u, and pt also plans surgical f/u in June 2014 with Dr Owens Shark at [facility]

## 2012-04-18 ENCOUNTER — Encounter: Payer: Self-pay | Admitting: Internal Medicine

## 2012-04-18 NOTE — Assessment & Plan Note (Signed)
stable overall by history and exam, recent data reviewed with pt, and pt to continue medical treatment as before,  to f/u any worsening symptoms or concerns Lab Results  Component Value Date   HGBA1C 6.6* 02/25/2012

## 2012-04-18 NOTE — Assessment & Plan Note (Signed)
O/w stable, now on eliquis instead of xarelto,  to f/u any worsening symptoms or concerns

## 2012-04-18 NOTE — Assessment & Plan Note (Signed)
stable overall by history and exam, recent data reviewed with pt, and pt to continue medical treatment as before,  to f/u any worsening symptoms or concerns BP Readings from Last 3 Encounters:  04/14/12 112/82  03/27/12 168/90  02/25/12 132/80

## 2012-04-27 ENCOUNTER — Other Ambulatory Visit (INDEPENDENT_AMBULATORY_CARE_PROVIDER_SITE_OTHER): Payer: Medicare Other

## 2012-04-27 ENCOUNTER — Ambulatory Visit: Payer: Medicare Other | Admitting: Cardiology

## 2012-04-27 DIAGNOSIS — C73 Malignant neoplasm of thyroid gland: Secondary | ICD-10-CM

## 2012-04-30 DIAGNOSIS — S43429A Sprain of unspecified rotator cuff capsule, initial encounter: Secondary | ICD-10-CM | POA: Diagnosis not present

## 2012-05-05 ENCOUNTER — Ambulatory Visit (INDEPENDENT_AMBULATORY_CARE_PROVIDER_SITE_OTHER): Payer: Medicare Other | Admitting: Endocrinology

## 2012-05-05 ENCOUNTER — Encounter: Payer: Self-pay | Admitting: Endocrinology

## 2012-05-05 VITALS — BP 118/60 | HR 72 | Temp 98.1°F | Resp 16 | Wt 225.0 lb

## 2012-05-05 DIAGNOSIS — C73 Malignant neoplasm of thyroid gland: Secondary | ICD-10-CM

## 2012-05-05 DIAGNOSIS — E890 Postprocedural hypothyroidism: Secondary | ICD-10-CM | POA: Insufficient documentation

## 2012-05-05 MED ORDER — LEVOTHYROXINE SODIUM 175 MCG PO TABS: 175.0000 ug | ORAL_TABLET | Freq: Every day | ORAL | Status: DC

## 2012-05-05 NOTE — Progress Notes (Signed)
Subjective:    Patient ID: Nathan Russo, male    DOB: 09-Oct-1940, 72 y.o.   MRN: [id number]  HPI In 2012, i advised pt to have thyroidectomy for suspicious nodules.  pt underwent thyroidectomy at [facility], and papillary adenocarcinoma was found (T3 N1b MX).  He is unaware of any nodule at the neck.   He was found to have AF approx 1 month ago.  He says in retrospect, he had palpitations, which have resolved.   Synthroid was reduced to 200 mcg/day, 1 month ago.  He denies weight change. Past Medical History  Diagnosis Date  . THYROID NODULE 02/07/2010  . DIABETES MELLITUS, TYPE II 04/28/2007  . HYPERLIPIDEMIA 04/28/2007  . OBESITY 10/24/2006  . CARPAL TUNNEL SYNDROME, BILATERAL 02/07/2009  . OTITIS MEDIA, ACUTE, LEFT 12/26/2009  . HYPERTENSION 10/21/2006  . BRADYCARDIA, CHRONIC 10/24/2006  . GERD 02/11/2009  . PHIMOSIS 02/07/2009  . DEGENERATIVE JOINT DISEASE, RIGHT KNEE 10/24/2006  . Dizziness and giddiness 12/19/2009  . FATIGUE 04/28/2007  . Headache 12/19/2009  . NECK MASS 02/07/2010  . CHEST PAIN-UNSPECIFIED 09/05/2008  . COLONIC POLYPS, HX OF 04/28/2007  . Abdominal pain, epigastric 04/11/2010  . CHOLELITHIASIS 04/22/2010  . BELCHING 04/23/2010  . Thyroid cancer 06/13/2010  . Cholelithiasis 06/13/2010  . Elevated PSA 06/13/2010  . S/P laparoscopic cholecystectomy 10/31/2010  . Depression 02/23/2011    Past Surgical History  Procedure Laterality Date  . Left knee surgery    . Right wrist surgury    . Cholecystectomy    . Thyroid surgery      History   Social History  . Marital Status: Married    Spouse Name: N/A    Number of Children: 3  . Years of Education: N/A   Occupational History  . former Event organiser     Social History Main Topics  . Smoking status: Former Research scientist (life sciences)  . Smokeless tobacco: Never Used  . Alcohol Use: No  . Drug Use: No  . Sexually Active: Not on file   Other Topics Concern  . Not on file   Social History Narrative  .  No narrative on file    Current Outpatient Prescriptions on File Prior to Visit  Medication Sig Dispense Refill  . apixaban (ELIQUIS) 5 MG TABS tablet Take by mouth 2 (two) times daily.      . Ascorbic Acid (VITAMIN C) 1000 MG tablet Take 1,000 mg by mouth daily.        . benazepril (LOTENSIN) 40 MG tablet Take 1 tablet (40 mg total) by mouth daily.  90 tablet  3  . Blood Glucose Monitoring Suppl (ACCU-CHEK COMPACT CARE KIT) KIT by Does not apply route.        . calcium carbonate (OS-CAL) 600 MG TABS Take 600 mg by mouth daily.        . Cinnamon 500 MG capsule Take 500 mg by mouth 2 (two) times daily.        [name] diltiazem (CARDIZEM CD) 120 MG 24 hr capsule Take 1 capsule (120 mg total) by mouth 2 (two) times daily.  180 capsule  3  . Flaxseed, Linseed, 1000 MG CAPS Take 1 capsule by mouth daily.      [name] gabapentin (NEURONTIN) 300 MG capsule Take 300 mg by mouth 3 (three) times daily.      . Garlic Oil (ODORLESS GARLIC) [id number] MG TABS Take by mouth 2 (two) times daily.        . Ginger, Zingiber officinalis, (GINGER  PO) Take by mouth 4 (four) times daily.        . Glucosamine-Chondroit-Vit C-Mn (GLUCOSAMINE CHONDROITIN COMPLX) CAPS Take by mouth daily.        . Glucose Blood (ACCU-CHEK INSTANT GLUCOSE TEST VI) by In Vitro route 4 (four) times daily.        . meclizine (ANTIVERT) 12.5 MG tablet Take 1 tablet by mouth every 6 hours as needed.  100 tablet  1  . metFORMIN (GLUCOPHAGE-XR) 500 MG 24 hr tablet Take 1 tablet three times per day  270 tablet  3  . Multiple Vitamin (MULTIVITAMIN) capsule Take 1 capsule by mouth daily.        . nabumetone (RELAFEN) 750 MG tablet Take 750 mg by mouth daily.      . Omega-3 Fatty Acids (FISH OIL) 1000 MG CAPS Take by mouth 2 (two) times daily.        [name] omeprazole (PRILOSEC) 20 MG capsule Take 1 capsule (20 mg total) by mouth 2 (two) times daily.  180 capsule  3  . sertraline (ZOLOFT) 100 MG tablet Take 1 tablet (100 mg total) by mouth daily.  90 tablet  3  .  traMADol (ULTRAM) 50 MG tablet Take 1 tablet (50 mg total) by mouth every 6 (six) hours as needed. Maximum dose= 8 tablets per day  120 tablet  2  . VITAMIN D, CHOLECALCIFEROL, PO Take by mouth daily.        [name] aspirin 81 MG EC tablet Take 81 mg by mouth daily.         No current facility-administered medications on file prior to visit.    Allergies  Allergen Reactions  . Ace Inhibitors     REACTION: cough  . Atenolol     REACTION: bradycardia  . Codeine Other (See Comments)    "wild"  . Lovastatin     REACTION: myalygros  . Sulfa Antibiotics Other (See Comments)    As child almost died    Family History  Problem Relation Age of Onset  . Hypertension Mother   . Arthritis Mother   . Goiter Mother   . Heart attack Father 90  . Cancer Sister     breast  . Hypothyroidism Sister   . Heart attack Brother 5  . Diabetes Brother   . Cancer Brother     colon  . Colon cancer Brother 60    BP 118/60  Pulse 72  Temp(Src) 98.1 F (36.7 C)  Resp 16  Wt 225 lb (102.059 kg)  BMI 30.51 kg/m2    Review of Systems Denies neck pain and hoarseness.      Objective:   Physical Exam VITAL SIGNS:  See vs page GENERAL: no distress Neck: a healed scar is present.  i do not appreciate a nodule in the thyroid or elsewhere in the neck.   Lab Results  Component Value Date   TSH 0.07* 04/27/2012  (i reviewed the thyroid pathology from 2012)    Assessment & Plan:  Stage 4a papillary adenocarcinoma of the thyroid, no evidence of recurrence. AF.  This has to be weighed against the risk of recurrence of his cancer.  For now, i favor normalizing his TSH Post-i-131 hypothyroidism, overreplaced

## 2012-05-05 NOTE — Patient Instructions (Addendum)
Please sign release of information for your thyroid cancer treatment at [facility].   blood tests are being requested for you today.  We'll contact you with results. Please come back for a follow-up appointment in 6 weeks.   Please reduce the thyroid medication.  i have sent a prescription to your pharmacy.

## 2012-05-06 LAB — THYROGLOBULIN ANTIBODY: Thyroglobulin Ab: <20 U/mL (ref <40.0)

## 2012-06-02 DIAGNOSIS — M542 Cervicalgia: Secondary | ICD-10-CM | POA: Diagnosis not present

## 2012-06-02 DIAGNOSIS — G4489 Other headache syndrome: Secondary | ICD-10-CM | POA: Diagnosis not present

## 2012-06-09 DIAGNOSIS — M542 Cervicalgia: Secondary | ICD-10-CM | POA: Diagnosis not present

## 2012-06-14 DIAGNOSIS — I499 Cardiac arrhythmia, unspecified: Secondary | ICD-10-CM | POA: Diagnosis not present

## 2012-06-14 DIAGNOSIS — Z85850 Personal history of malignant neoplasm of thyroid: Secondary | ICD-10-CM | POA: Diagnosis not present

## 2012-06-14 DIAGNOSIS — I4891 Unspecified atrial fibrillation: Secondary | ICD-10-CM | POA: Diagnosis not present

## 2012-06-15 DIAGNOSIS — M542 Cervicalgia: Secondary | ICD-10-CM | POA: Diagnosis not present

## 2012-06-16 ENCOUNTER — Encounter: Payer: Self-pay | Admitting: Endocrinology

## 2012-06-16 DIAGNOSIS — I498 Other specified cardiac arrhythmias: Secondary | ICD-10-CM | POA: Diagnosis not present

## 2012-06-16 DIAGNOSIS — I4891 Unspecified atrial fibrillation: Secondary | ICD-10-CM | POA: Diagnosis not present

## 2012-06-18 ENCOUNTER — Encounter: Payer: Self-pay | Admitting: Endocrinology

## 2012-06-18 ENCOUNTER — Ambulatory Visit (INDEPENDENT_AMBULATORY_CARE_PROVIDER_SITE_OTHER): Payer: Medicare Other | Admitting: Endocrinology

## 2012-06-18 ENCOUNTER — Other Ambulatory Visit: Payer: Self-pay | Admitting: Endocrinology

## 2012-06-18 VITALS — BP 156/80 | HR 59 | Wt 224.0 lb

## 2012-06-18 DIAGNOSIS — C73 Malignant neoplasm of thyroid gland: Secondary | ICD-10-CM

## 2012-06-18 DIAGNOSIS — M542 Cervicalgia: Secondary | ICD-10-CM | POA: Diagnosis not present

## 2012-06-18 DIAGNOSIS — E890 Postprocedural hypothyroidism: Secondary | ICD-10-CM

## 2012-06-18 LAB — TSH: TSH: 0.06 u[IU]/mL — ABNORMAL LOW (ref 0.35–5.50)

## 2012-06-18 MED ORDER — LEVOTHYROXINE SODIUM 150 MCG PO TABS: 150.0000 ug | ORAL_TABLET | Freq: Every day | ORAL | Status: DC

## 2012-06-18 NOTE — Patient Instructions (Signed)
A thyroid blood test is requested for you today.  We'll contact you with results. Please come back for a follow-up appointment in 4 months.

## 2012-06-18 NOTE — Progress Notes (Signed)
Subjective:    Patient ID: Nathan Russo, male    DOB: Apr 22, 1940, 72 y.o.   MRN: [id number]  HPI The state of at least three ongoing medical problems is addressed today, with interval history of each noted here: Pt returns for f/u fo stage 4a papillary adenocarcinoma of the thyroid. 3/12: thyroidectomy with left neck dissection multifocal papillary adenocarcinoma, largest focus 2.9 cm, at the isthmus, with bilateral foci, 1/11 nodes pos in central compartment, 4/44 nodes pos at levels 2-5 (largest 2 cm), extrathyroidal extension, and lymphatic invasion: T3 N1b M0 4/12: body scan, after thyroid hormone withdrawal: uptake 3%; 2 foci at the thyroid bed only 4/12: i-131 rx [facility] 12/12: tg<0.2 (ab neg) 6/13: tg<0.2 (ab neg) 1/14: tg<0.2 (ab neg) He does not notice any lump in the neck. AF: was again noted by cardiol at [facility] hospital.  he was started on flecanide.  He required repeat cardioversion.   Denies sob Postsurgical hypothyroidism: he denies excessive diaphoresis.   Past Medical History  Diagnosis Date  . THYROID NODULE 02/07/2010  . DIABETES MELLITUS, TYPE II 04/28/2007  . HYPERLIPIDEMIA 04/28/2007  . OBESITY 10/24/2006  . CARPAL TUNNEL SYNDROME, BILATERAL 02/07/2009  . OTITIS MEDIA, ACUTE, LEFT 12/26/2009  . HYPERTENSION 10/21/2006  . BRADYCARDIA, CHRONIC 10/24/2006  . GERD 02/11/2009  . PHIMOSIS 02/07/2009  . DEGENERATIVE JOINT DISEASE, RIGHT KNEE 10/24/2006  . Dizziness and giddiness 12/19/2009  . FATIGUE 04/28/2007  . Headache 12/19/2009  . NECK MASS 02/07/2010  . CHEST PAIN-UNSPECIFIED 09/05/2008  . COLONIC POLYPS, HX OF 04/28/2007  . Abdominal pain, epigastric 04/11/2010  . CHOLELITHIASIS 04/22/2010  . BELCHING 04/23/2010  . Thyroid cancer 06/13/2010  . Cholelithiasis 06/13/2010  . Elevated PSA 06/13/2010  . S/P laparoscopic cholecystectomy 10/31/2010  . Depression 02/23/2011    Past Surgical History  Procedure Laterality Date  . Left knee surgery    . Right wrist surgury    .  Cholecystectomy    . Thyroid surgery      History   Social History  . Marital Status: Married    Spouse Name: N/A    Number of Children: 3  . Years of Education: N/A   Occupational History  . former Event organiser     Social History Main Topics  . Smoking status: Former Research scientist (life sciences)  . Smokeless tobacco: Never Used  . Alcohol Use: No  . Drug Use: No  . Sexually Active: Not on file   Other Topics Concern  . Not on file   Social History Narrative  . No narrative on file    Current Outpatient Prescriptions on File Prior to Visit  Medication Sig Dispense Refill  . apixaban (ELIQUIS) 5 MG TABS tablet Take by mouth 2 (two) times daily.      . Ascorbic Acid (VITAMIN C) 1000 MG tablet Take 1,000 mg by mouth daily.        [name] aspirin 81 MG EC tablet Take 81 mg by mouth daily.        . benazepril (LOTENSIN) 40 MG tablet Take 1 tablet (40 mg total) by mouth daily.  90 tablet  3  . Blood Glucose Monitoring Suppl (ACCU-CHEK COMPACT CARE KIT) KIT by Does not apply route.        . calcium carbonate (OS-CAL) 600 MG TABS Take 600 mg by mouth daily.        . Cinnamon 500 MG capsule Take 500 mg by mouth 2 (two) times daily.        [name]  diltiazem (CARDIZEM CD) 120 MG 24 hr capsule Take 1 capsule (120 mg total) by mouth 2 (two) times daily.  180 capsule  3  . Flaxseed, Linseed, 1000 MG CAPS Take 1 capsule by mouth daily.      [name] gabapentin (NEURONTIN) 300 MG capsule Take 300 mg by mouth 3 (three) times daily.      . Garlic Oil (ODORLESS GARLIC) [id number] MG TABS Take by mouth 2 (two) times daily.        . Ginger, Zingiber officinalis, (GINGER PO) Take by mouth 4 (four) times daily.        . Glucosamine-Chondroit-Vit C-Mn (GLUCOSAMINE CHONDROITIN COMPLX) CAPS Take by mouth daily.        . Glucose Blood (ACCU-CHEK INSTANT GLUCOSE TEST VI) by In Vitro route 4 (four) times daily.        [name] levothyroxine (SYNTHROID, LEVOTHROID) 175 MCG tablet Take 1 tablet (175 mcg total) by mouth daily.  30  tablet  5  . meclizine (ANTIVERT) 12.5 MG tablet Take 1 tablet by mouth every 6 hours as needed.  100 tablet  1  . metFORMIN (GLUCOPHAGE-XR) 500 MG 24 hr tablet Take 1 tablet three times per day  270 tablet  3  . Multiple Vitamin (MULTIVITAMIN) capsule Take 1 capsule by mouth daily.        . nabumetone (RELAFEN) 750 MG tablet Take 750 mg by mouth daily.      . Omega-3 Fatty Acids (FISH OIL) 1000 MG CAPS Take by mouth 2 (two) times daily.        [name] omeprazole (PRILOSEC) 20 MG capsule Take 1 capsule (20 mg total) by mouth 2 (two) times daily.  180 capsule  3  . sertraline (ZOLOFT) 100 MG tablet Take 1 tablet (100 mg total) by mouth daily.  90 tablet  3  . traMADol (ULTRAM) 50 MG tablet Take 1 tablet (50 mg total) by mouth every 6 (six) hours as needed. Maximum dose= 8 tablets per day  120 tablet  2  . VITAMIN D, CHOLECALCIFEROL, PO Take by mouth daily.         No current facility-administered medications on file prior to visit.    Allergies  Allergen Reactions  . Ace Inhibitors     REACTION: cough  . Atenolol     REACTION: bradycardia  . Codeine Other (See Comments)    "wild"  . Lovastatin     REACTION: myalygros  . Sulfa Antibiotics Other (See Comments)    As child almost died    Family History  Problem Relation Age of Onset  . Hypertension Mother   . Arthritis Mother   . Goiter Mother   . Heart attack Father 34  . Cancer Sister     breast  . Hypothyroidism Sister   . Heart attack Brother 52  . Diabetes Brother   . Cancer Brother     colon  . Colon cancer Brother 60    BP 156/80  Pulse 59  Wt 224 lb (101.606 kg)  BMI 30.37 kg/m2  SpO2 96%  Review of Systems Denies weight change and LOC.    Objective:   Physical Exam VITAL SIGNS:  See vs page GENERAL: no distress Skin: not diaphoretic Neuro: no tremor     Assessment & Plan:  Stage-4a thyroid cancer, no evidence of recurrence. Post-i-131 hypothyroidism, on reduced synthroid. AF, ? Related to synthroid  dosage

## 2012-06-25 DIAGNOSIS — M542 Cervicalgia: Secondary | ICD-10-CM | POA: Diagnosis not present

## 2012-06-30 DIAGNOSIS — M542 Cervicalgia: Secondary | ICD-10-CM | POA: Diagnosis not present

## 2012-07-02 DIAGNOSIS — M542 Cervicalgia: Secondary | ICD-10-CM | POA: Diagnosis not present

## 2012-07-06 DIAGNOSIS — M542 Cervicalgia: Secondary | ICD-10-CM | POA: Diagnosis not present

## 2012-07-14 DIAGNOSIS — I440 Atrioventricular block, first degree: Secondary | ICD-10-CM | POA: Diagnosis not present

## 2012-07-14 DIAGNOSIS — I471 Supraventricular tachycardia: Secondary | ICD-10-CM | POA: Diagnosis not present

## 2012-07-16 DIAGNOSIS — M542 Cervicalgia: Secondary | ICD-10-CM | POA: Diagnosis not present

## 2012-07-21 DIAGNOSIS — M542 Cervicalgia: Secondary | ICD-10-CM | POA: Diagnosis not present

## 2012-07-27 DIAGNOSIS — M542 Cervicalgia: Secondary | ICD-10-CM | POA: Diagnosis not present

## 2012-07-28 DIAGNOSIS — M542 Cervicalgia: Secondary | ICD-10-CM | POA: Diagnosis not present

## 2012-08-13 DIAGNOSIS — M542 Cervicalgia: Secondary | ICD-10-CM | POA: Diagnosis not present

## 2012-08-26 ENCOUNTER — Ambulatory Visit: Payer: Medicare Other | Admitting: Internal Medicine

## 2012-08-30 DIAGNOSIS — C73 Malignant neoplasm of thyroid gland: Secondary | ICD-10-CM | POA: Diagnosis not present

## 2012-09-07 ENCOUNTER — Other Ambulatory Visit: Payer: Self-pay | Admitting: Internal Medicine

## 2012-09-09 DIAGNOSIS — L570 Actinic keratosis: Secondary | ICD-10-CM | POA: Diagnosis not present

## 2012-09-09 DIAGNOSIS — D239 Other benign neoplasm of skin, unspecified: Secondary | ICD-10-CM | POA: Diagnosis not present

## 2012-09-09 DIAGNOSIS — Z85828 Personal history of other malignant neoplasm of skin: Secondary | ICD-10-CM | POA: Diagnosis not present

## 2012-09-09 DIAGNOSIS — L408 Other psoriasis: Secondary | ICD-10-CM | POA: Diagnosis not present

## 2012-09-09 DIAGNOSIS — B352 Tinea manuum: Secondary | ICD-10-CM | POA: Diagnosis not present

## 2012-09-14 DIAGNOSIS — M542 Cervicalgia: Secondary | ICD-10-CM | POA: Diagnosis not present

## 2012-09-17 ENCOUNTER — Ambulatory Visit (INDEPENDENT_AMBULATORY_CARE_PROVIDER_SITE_OTHER): Payer: Medicare Other | Admitting: Internal Medicine

## 2012-09-17 ENCOUNTER — Ambulatory Visit: Payer: Medicare Other | Admitting: Internal Medicine

## 2012-09-17 ENCOUNTER — Encounter: Payer: Self-pay | Admitting: Internal Medicine

## 2012-09-17 ENCOUNTER — Other Ambulatory Visit (INDEPENDENT_AMBULATORY_CARE_PROVIDER_SITE_OTHER): Payer: Medicare Other

## 2012-09-17 VITALS — BP 120/60 | HR 48 | Temp 97.2°F | Resp 12 | Ht 72.0 in | Wt 229.8 lb

## 2012-09-17 DIAGNOSIS — L570 Actinic keratosis: Secondary | ICD-10-CM | POA: Diagnosis not present

## 2012-09-17 DIAGNOSIS — E119 Type 2 diabetes mellitus without complications: Secondary | ICD-10-CM

## 2012-09-17 DIAGNOSIS — E785 Hyperlipidemia, unspecified: Secondary | ICD-10-CM | POA: Diagnosis not present

## 2012-09-17 DIAGNOSIS — I10 Essential (primary) hypertension: Secondary | ICD-10-CM

## 2012-09-17 DIAGNOSIS — M542 Cervicalgia: Secondary | ICD-10-CM | POA: Diagnosis not present

## 2012-09-17 DIAGNOSIS — G8929 Other chronic pain: Secondary | ICD-10-CM | POA: Diagnosis not present

## 2012-09-17 LAB — BASIC METABOLIC PANEL
BUN: 19 mg/dL (ref 6–23)
Chloride: 107 mEq/L (ref 96–112)
Creatinine, Ser: 1.3 mg/dL (ref 0.4–1.5)
GFR: 57.33 mL/min — ABNORMAL LOW (ref >60.00)
Potassium: 4 mEq/L (ref 3.5–5.1)

## 2012-09-17 LAB — LIPID PANEL
Cholesterol: 167 mg/dL (ref 0–200)
LDL Cholesterol: 110 mg/dL — ABNORMAL HIGH (ref 0–99)
Total CHOL/HDL Ratio: 5
Triglycerides: 116 mg/dL (ref 0.0–149.0)
VLDL: 23.2 mg/dL (ref 0.0–40.0)

## 2012-09-17 LAB — HEPATIC FUNCTION PANEL
ALT: 27 U/L (ref 0–53)
Bilirubin, Direct: 0.1 mg/dL (ref 0.0–0.3)
Total Bilirubin: 1 mg/dL (ref 0.3–1.2)

## 2012-09-17 MED ORDER — TRAMADOL HCL 50 MG PO TABS: 50.0000 mg | ORAL_TABLET | Freq: Four times a day (QID) | ORAL | Status: DC | PRN

## 2012-09-17 MED ORDER — LEVOTHYROXINE SODIUM 150 MCG PO TABS: 150.0000 ug | ORAL_TABLET | Freq: Every day | ORAL | Status: DC

## 2012-09-17 MED ORDER — SERTRALINE HCL 100 MG PO TABS: 100.0000 mg | ORAL_TABLET | Freq: Every day | ORAL | Status: DC

## 2012-09-17 MED ORDER — OMEPRAZOLE 20 MG PO CPDR: 20.0000 mg | DELAYED_RELEASE_CAPSULE | Freq: Two times a day (BID) | ORAL | Status: DC

## 2012-09-17 MED ORDER — METFORMIN HCL ER 500 MG PO TB24: ORAL_TABLET | ORAL | Status: DC

## 2012-09-17 MED ORDER — DILTIAZEM HCL ER COATED BEADS 120 MG PO CP24: 120.0000 mg | ORAL_CAPSULE | Freq: Two times a day (BID) | ORAL | Status: DC

## 2012-09-17 NOTE — Addendum Note (Signed)
Addended by: Sharon Seller B on: 09/17/2012 10:43 AM   Modules accepted: Orders

## 2012-09-17 NOTE — Assessment & Plan Note (Signed)
stable overall by history and exam, recent data reviewed with pt, and pt to continue medical treatment as before,  to f/u any worsening symptoms or concerns Lab Results  Component Value Date   LDLCALC 111* 02/25/2012

## 2012-09-17 NOTE — Assessment & Plan Note (Signed)
Much improved, for tramadol refill for prn use only

## 2012-09-17 NOTE — Assessment & Plan Note (Signed)
stable overall by history and exam, recent data reviewed with pt, and pt to continue medical treatment as before,  to f/u any worsening symptoms or concerns BP Readings from Last 3 Encounters:  09/17/12 120/60  06/18/12 156/80  05/05/12 118/60

## 2012-09-17 NOTE — Patient Instructions (Signed)
Please continue all other medications as before, and refills have been done if requested. Please have the pharmacy call with any other refills you may need. Please continue your efforts at being more active, low cholesterol diet, and weight control. Please keep your appointments with your specialists as you have planned Please go to the LAB in the Basement (turn left off the elevator) for the tests to be done today You will be contacted by phone if any changes need to be made immediately.  Otherwise, you will receive a letter about your results with an explanation, but please check with MyChart first.  Please remember to sign up for My Chart if you have not done so, as this will be important to you in the future with finding out test results, communicating by private email, and scheduling acute appointments online when needed.  Please return in 6 months, or sooner if needed

## 2012-09-17 NOTE — Progress Notes (Addendum)
Subjective:    Patient ID: Nathan Russo, male    DOB: 1940-07-30, 72 y.o.   MRN: [id number]  HPI  Here to f/u; overall doing ok,  Pt denies chest pain, increased sob or doe, wheezing, orthopnea, PND, increased LE swelling, palpitations, dizziness or syncope.  Pt denies polydipsia, polyuria, or low sugar symptoms such as weakness or confusion improved with po intake.  Pt denies new neurological symptoms such as new headache, or facial or extremity weakness or numbness.   Pt states overall good compliance with meds, has been trying to follow lower cholesterol, diabetic diet, with wt overall stable,  but little exercise however.  On med per Dr lewit, warned of wt gain with tx, gaoined 20- lbs since dec 2013, but no longer  On meclizine, nabumetome and sertraline, and gabapentin. 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  Denies worsening depressive symptoms, suicidal ideation, or panic; has ongoing anxiety, not increased recently.  Needs meds at 90 day refills. Will need eventuall surgury for right shoulder, but may have the cardiac ablation soon, but now on flecainide, doing well. Pain overall not near as severe, taking less tramadol.  Dizziness essentially resolved.  Garlic seems to help as well. Past Medical History  Diagnosis Date  . THYROID NODULE 02/07/2010  . DIABETES MELLITUS, TYPE II 04/28/2007  . HYPERLIPIDEMIA 04/28/2007  . OBESITY 10/24/2006  . CARPAL TUNNEL SYNDROME, BILATERAL 02/07/2009  . OTITIS MEDIA, ACUTE, LEFT 12/26/2009  . HYPERTENSION 10/21/2006  . BRADYCARDIA, CHRONIC 10/24/2006  . GERD 02/11/2009  . PHIMOSIS 02/07/2009  . DEGENERATIVE JOINT DISEASE, RIGHT KNEE 10/24/2006  . Dizziness and giddiness 12/19/2009  . FATIGUE 04/28/2007  . Headache(784.0) 12/19/2009  . NECK MASS 02/07/2010  . CHEST PAIN-UNSPECIFIED 09/05/2008  . COLONIC POLYPS, HX OF 04/28/2007  . Abdominal pain, epigastric 04/11/2010  . CHOLELITHIASIS 04/22/2010  . BELCHING 04/23/2010  .  Thyroid cancer 06/13/2010  . Cholelithiasis 06/13/2010  . Elevated PSA 06/13/2010  . S/P laparoscopic cholecystectomy 10/31/2010  . Depression 02/23/2011   Past Surgical History  Procedure Laterality Date  . Left knee surgery    . Right wrist surgury    . Cholecystectomy    . Thyroid surgery      reports that he has quit smoking. He has never used smokeless tobacco. He reports that he does not drink alcohol or use illicit drugs. family history includes Arthritis in his mother; Cancer in his brother and sister; Colon cancer (age of onset: 80) in his brother; Diabetes in his brother; Goiter in his mother; Heart attack (age of onset: 88) in his brother; Heart attack (age of onset: 18) in his father; Hypertension in his mother; and Hypothyroidism in his sister. Allergies  Allergen Reactions  . Ace Inhibitors     REACTION: cough  . Atenolol     REACTION: bradycardia  . Codeine Other (See Comments)    "wild"  . Lovastatin     REACTION: myalygros  . Sulfa Antibiotics Other (See Comments)    As child almost died   Current Outpatient Prescriptions on File Prior to Visit  Medication Sig Dispense Refill  . apixaban (ELIQUIS) 5 MG TABS tablet Take by mouth 2 (two) times daily.      . Ascorbic Acid (VITAMIN C) 1000 MG tablet Take 1,000 mg by mouth daily.        [name] aspirin 81 MG EC tablet Take 81 mg by mouth daily.        . benazepril (  LOTENSIN) 40 MG tablet Take 1 tablet (40 mg total) by mouth daily.  90 tablet  3  . Blood Glucose Monitoring Suppl (ACCU-CHEK COMPACT CARE KIT) KIT by Does not apply route.        . calcium carbonate (OS-CAL) 600 MG TABS Take 600 mg by mouth daily.        . Cinnamon 500 MG capsule Take 500 mg by mouth 2 (two) times daily.        [name] diltiazem (CARDIZEM CD) 120 MG 24 hr capsule Take 1 capsule (120 mg total) by mouth 2 (two) times daily.  180 capsule  3  . Flaxseed, Linseed, 1000 MG CAPS Take 1 capsule by mouth daily.      . flecainide (TAMBOCOR) 100 MG tablet Take 100 mg  by mouth 2 (two) times daily.      . Garlic Oil (ODORLESS GARLIC) [id number] MG TABS Take by mouth 2 (two) times daily.        . Ginger, Zingiber officinalis, (GINGER PO) Take by mouth 4 (four) times daily.        . Glucosamine-Chondroit-Vit C-Mn (GLUCOSAMINE CHONDROITIN COMPLX) CAPS Take by mouth daily.        . Glucose Blood (ACCU-CHEK INSTANT GLUCOSE TEST VI) by In Vitro route 4 (four) times daily.        [name] levothyroxine (SYNTHROID, LEVOTHROID) 150 MCG tablet Take 1 tablet (150 mcg total) by mouth daily before breakfast.  30 tablet  5  . metFORMIN (GLUCOPHAGE-XR) 500 MG 24 hr tablet Take 1 tablet three times per day  270 tablet  3  . Multiple Vitamin (MULTIVITAMIN) capsule Take 1 capsule by mouth daily.        . Omega-3 Fatty Acids (FISH OIL) 1000 MG CAPS Take by mouth 2 (two) times daily.        [name] omeprazole (PRILOSEC) 20 MG capsule TAKE 1 CAPSULE (20 MG TOTAL) BY MOUTH 2 (TWO) TIMES DAILY.  60 capsule  5  . traMADol (ULTRAM) 50 MG tablet Take 1 tablet (50 mg total) by mouth every 6 (six) hours as needed. Maximum dose= 8 tablets per day  120 tablet  2  . VITAMIN D, CHOLECALCIFEROL, PO Take by mouth daily.         No current facility-administered medications on file prior to visit.   Review of Systems  Constitutional: Negative for unexpected weight change, or unusual diaphoresis  HENT: Negative for tinnitus.   Eyes: Negative for photophobia and visual disturbance.  Respiratory: Negative for choking and stridor.   Gastrointestinal: Negative for vomiting and blood in stool.  Genitourinary: Negative for hematuria and decreased urine volume.  Musculoskeletal: Negative for acute joint swelling Skin: Negative for color change and wound.  Neurological: Negative for tremors and numbness other than noted  Psychiatric/Behavioral: Negative for decreased concentration or  hyperactivity.       Objective:   Physical Exam BP 120/60  Pulse 48  Temp(Src) 97.2 F (36.2 C) (Oral)  Resp 12  Ht 6' (1.829  m)  Wt 229 lb 12.8 oz (104.237 kg)  BMI 31.16 kg/m2  SpO2 97% VS noted, not ill appaering, appears less pain,more upbeat Constitutional: Pt appears well-developed and well-nourished. Annabell Sabal HENT: Head: NCAT.  Right Ear: External ear normal.  Left Ear: External ear normal.  Eyes: Conjunctivae and EOM are normal. Pupils are equal, round, and reactive to light.  Neck: Normal range of motion. Neck supple.  Cardiovascular: Normal rate and regular rhythm.   Pulmonary/Chest: Effort normal and  breath sounds normal.  Abd:  Soft, NT, non-distended, + BS Neurological: Pt is alert. Not confused  Skin: Skin is warm. No erythema.  Psychiatric: Pt behavior is normal. Thought content normal.     Assessment & Plan:  Quality Measures addressed:  Diabetes Blood Pressure < 140/90: pt declines further medication change at this time Diabetes LDL < 100: pt declines further medication Diabetes Hgba1c < 8%: pt declines further medication

## 2012-09-17 NOTE — Assessment & Plan Note (Signed)
stable overall by history and exam, recent data reviewed with pt, and pt to continue medical treatment as before,  to f/u any worsening symptoms or concerns Lab Results  Component Value Date   HGBA1C 6.6* 02/25/2012

## 2012-09-20 DIAGNOSIS — M542 Cervicalgia: Secondary | ICD-10-CM | POA: Diagnosis not present

## 2012-09-24 DIAGNOSIS — M542 Cervicalgia: Secondary | ICD-10-CM | POA: Diagnosis not present

## 2012-10-02 IMAGING — CR DG RIBS W/ CHEST 3+V*L*
4 series · 4 of 4 positions shown · non-contrast
Comparison: 08/22/2008

CLINICAL DATA: Cough, left rib pain

LEFT RIBS AND CHEST - 3+ VIEW

[w chest pa *]
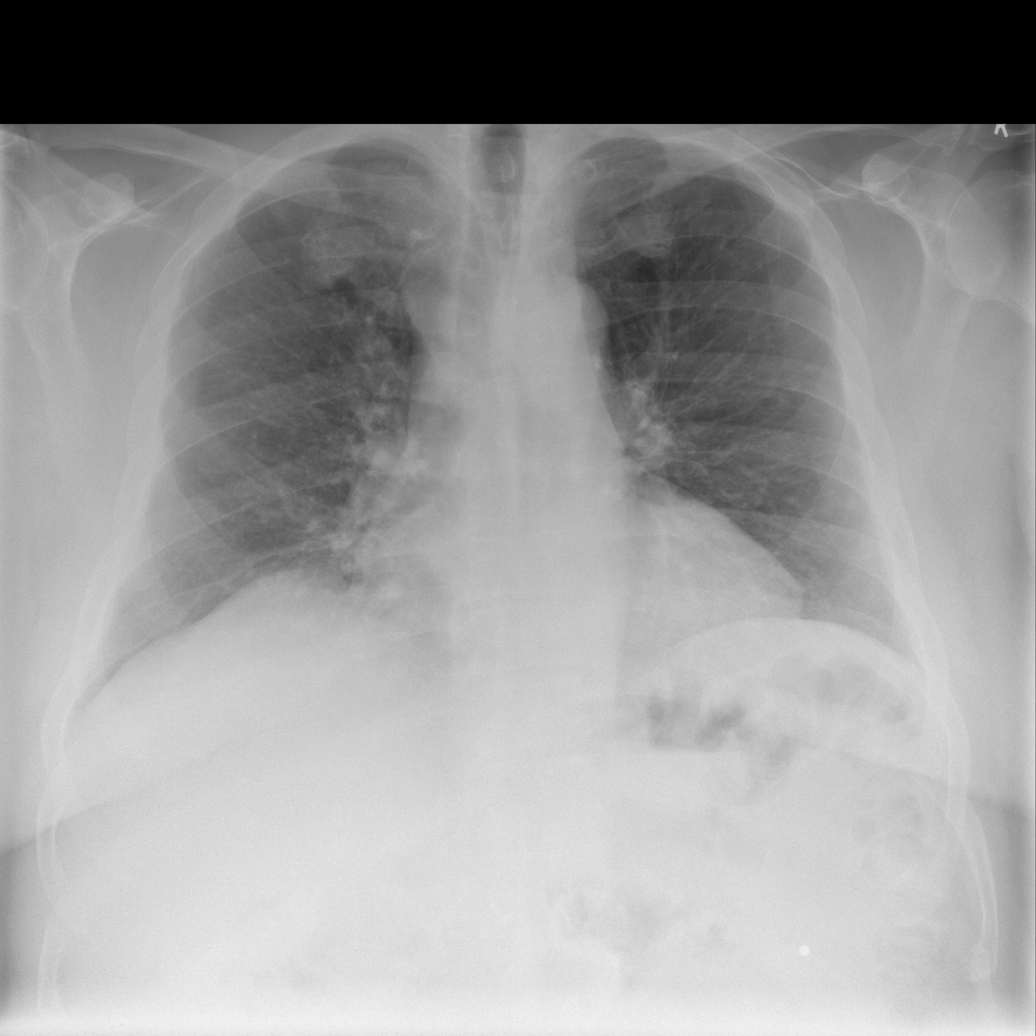

[w ribs ap/pa lower left *]
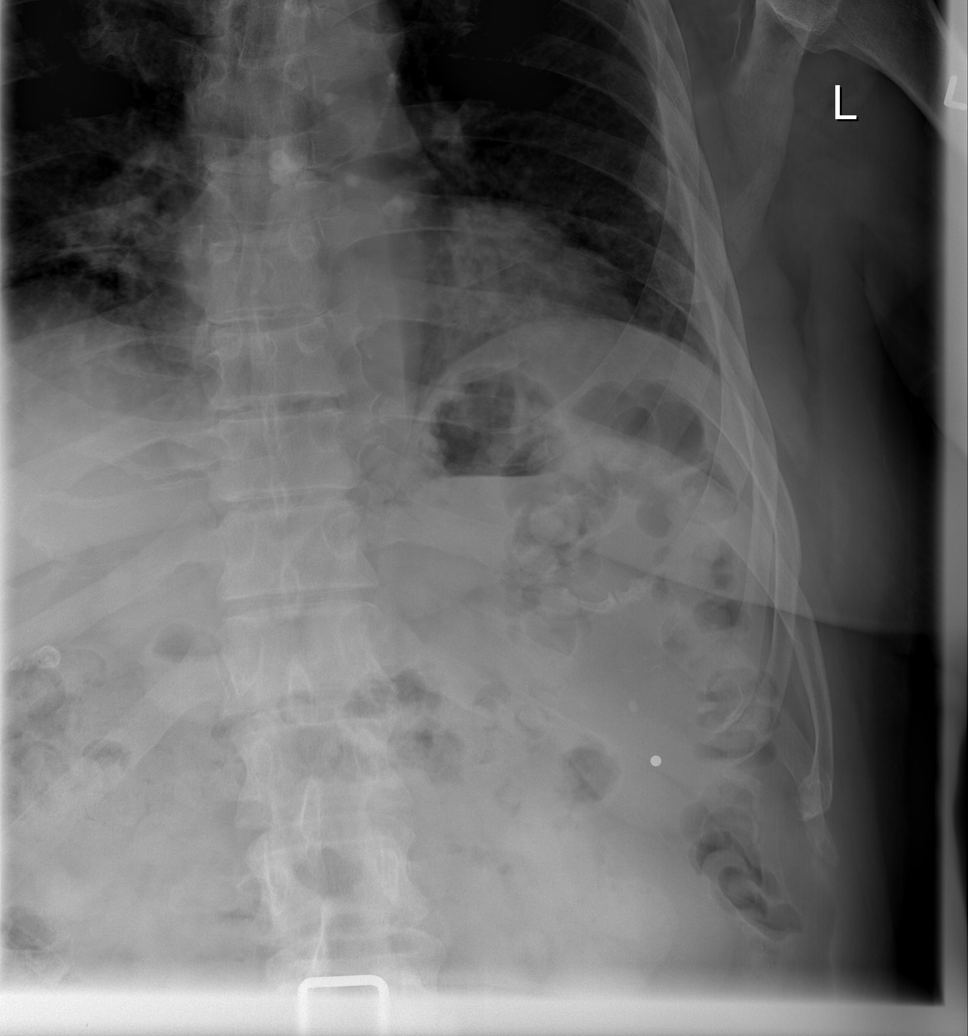

[w ribs oblique left * (1 of 2)]
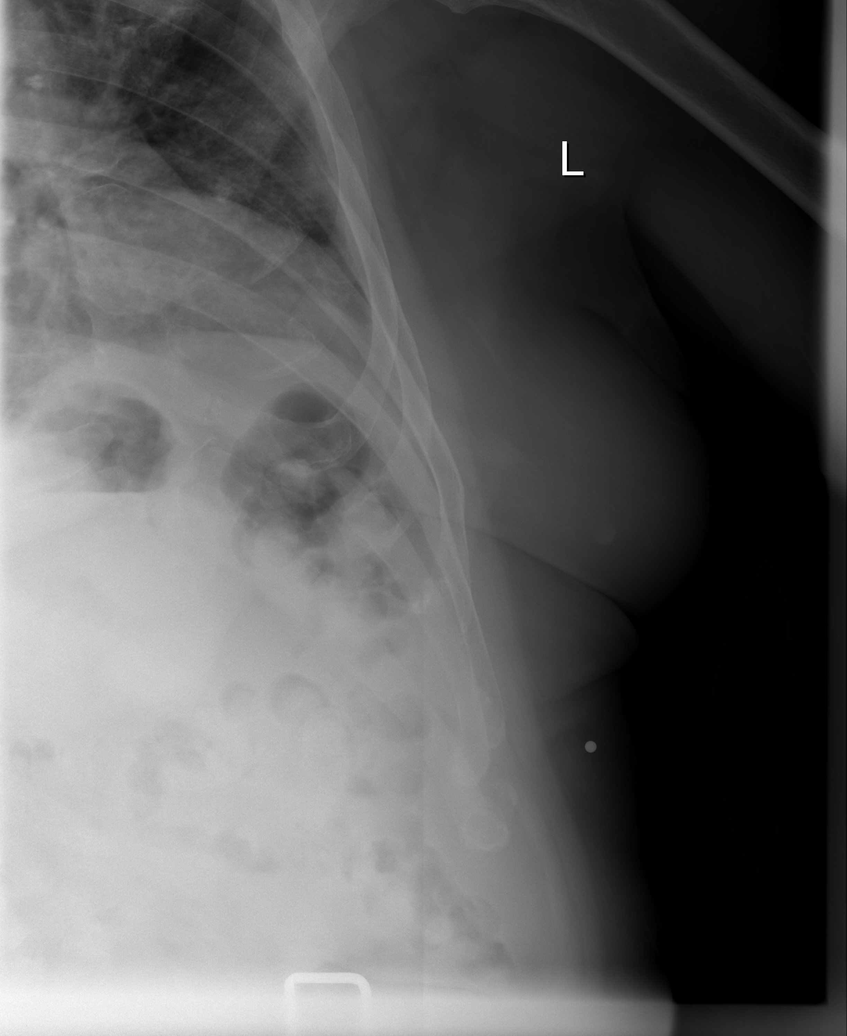

[w ribs oblique left * (2 of 2)]
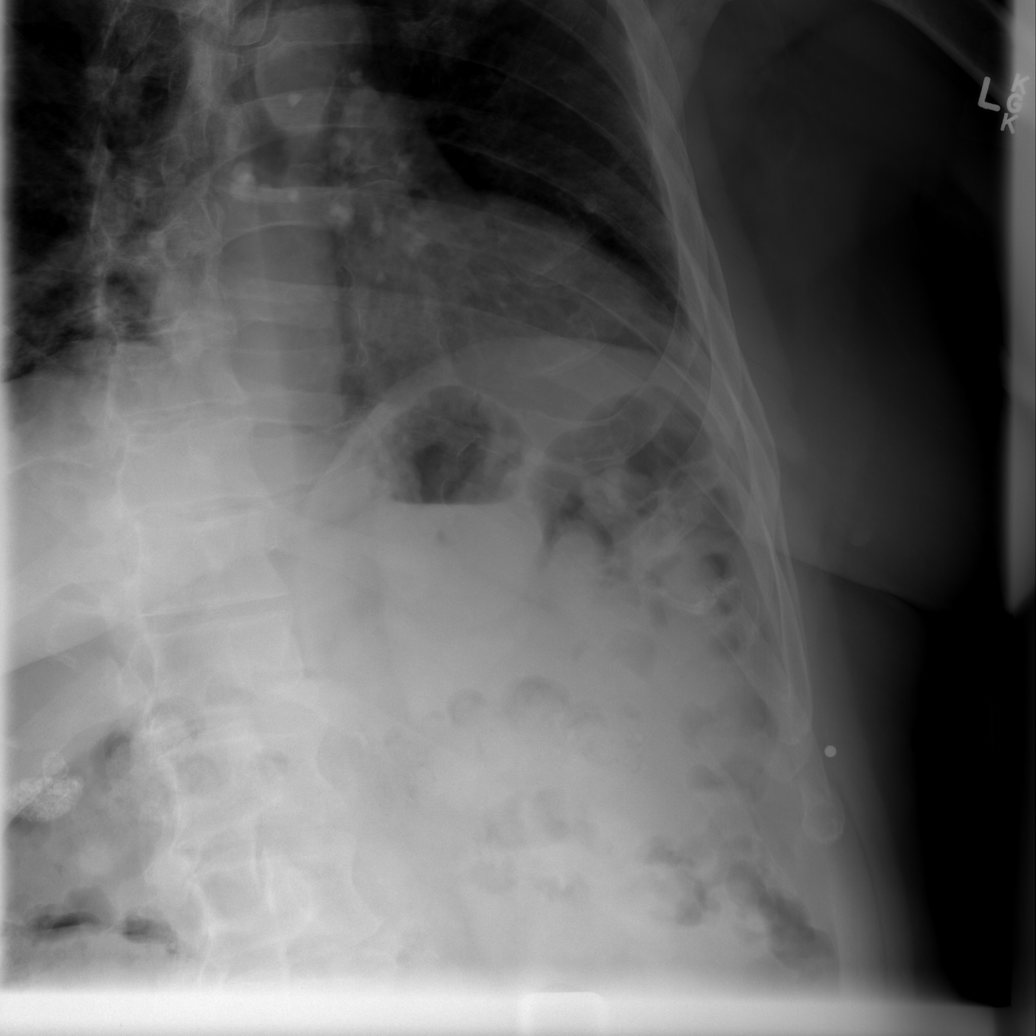

[4 of 4 positions shown; findings below may reference images not displayed]

FINDINGS: Hypoventilation with mild bibasilar atelectasis.
Negative for heart failure or effusion.

Left rib detail images reveal no acute rib fracture.  No bony
lesion is identified.
IMPRESSION: Hypoventilation with bibasilar atelectasis.

Negative for left rib fracture.

## 2012-11-12 IMAGING — US US ABDOMEN COMPLETE
1 series · 14 of 25 positions shown · non-contrast
Comparison: None

CLINICAL DATA: Epigastric abdominal pain

COMPLETE ABDOMINAL ULTRASOUND

[Series 1: us abdomen complete · 0.39mm/px · 14 of 90 slices shown]
[im 1/90]
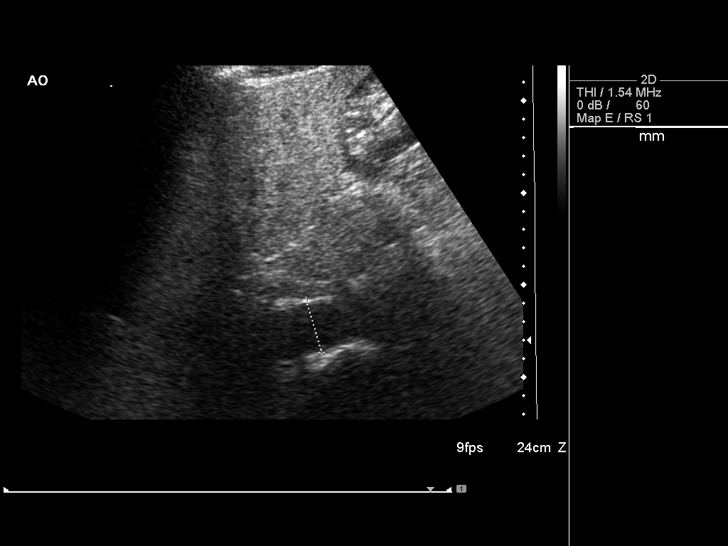
[im 8/90]
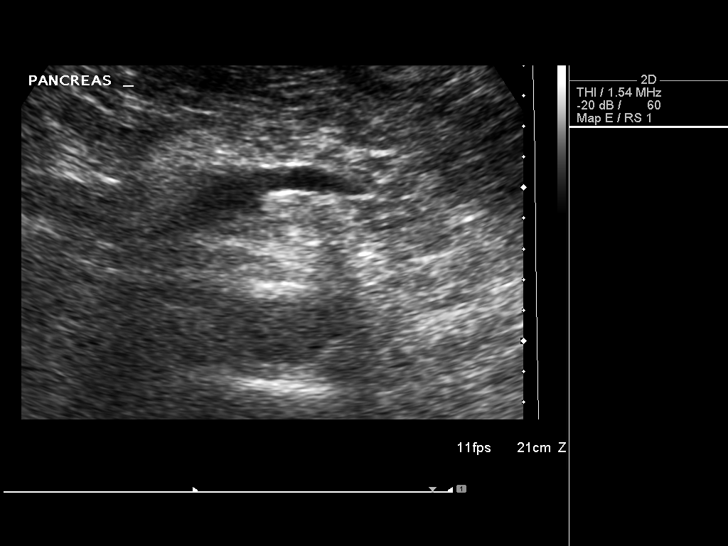
[im 15/90]
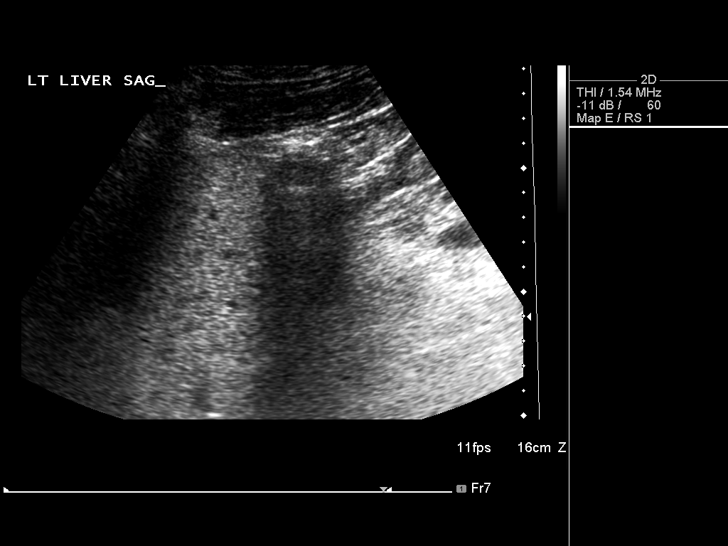
[im 23/90]
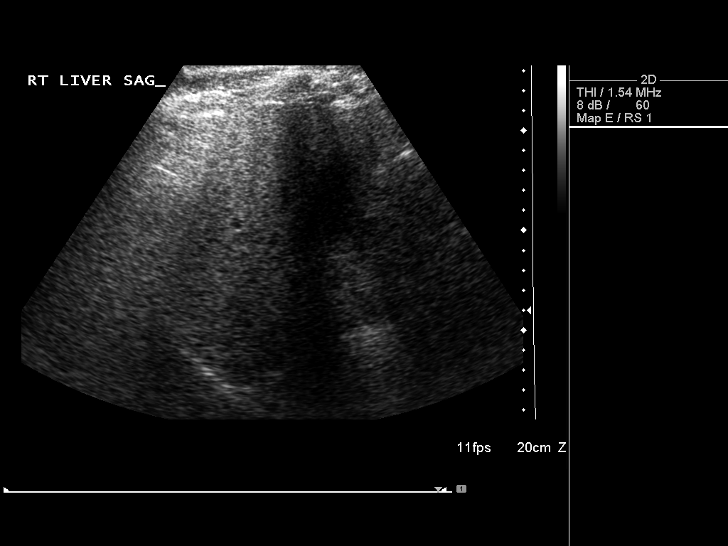
[im 30/90]
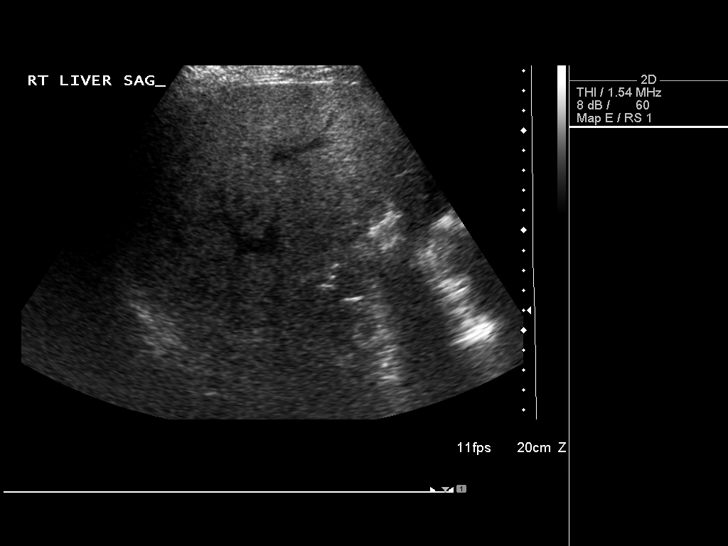
[im 34/90]
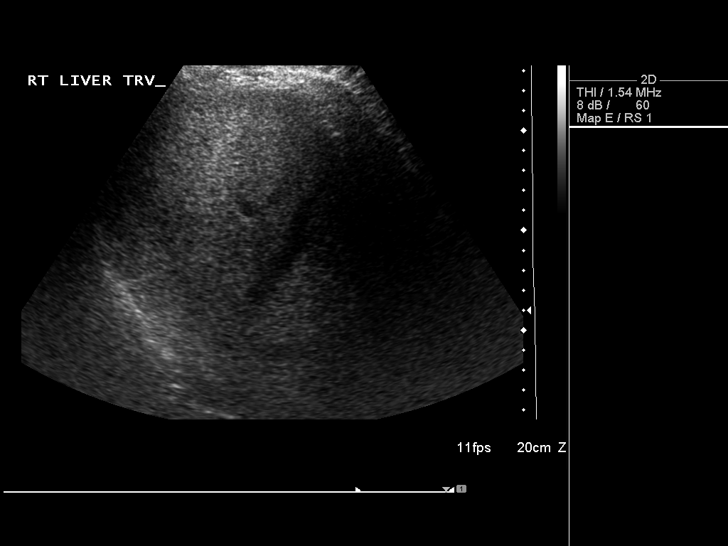
[im 41/90]
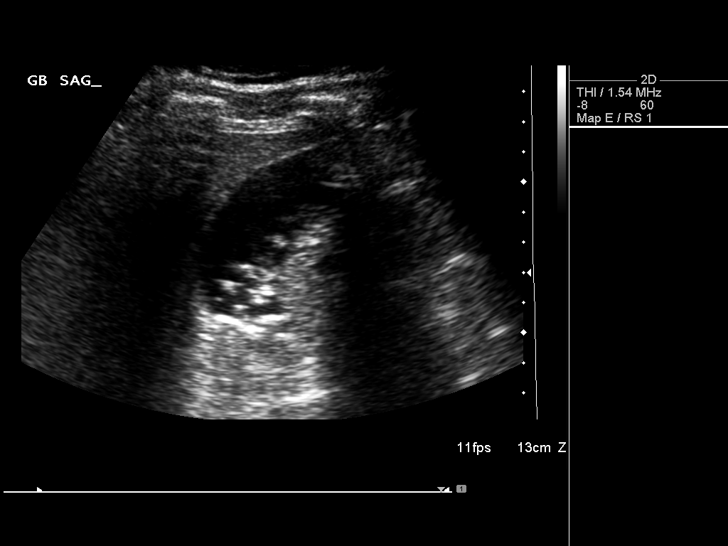
[im 49/90]
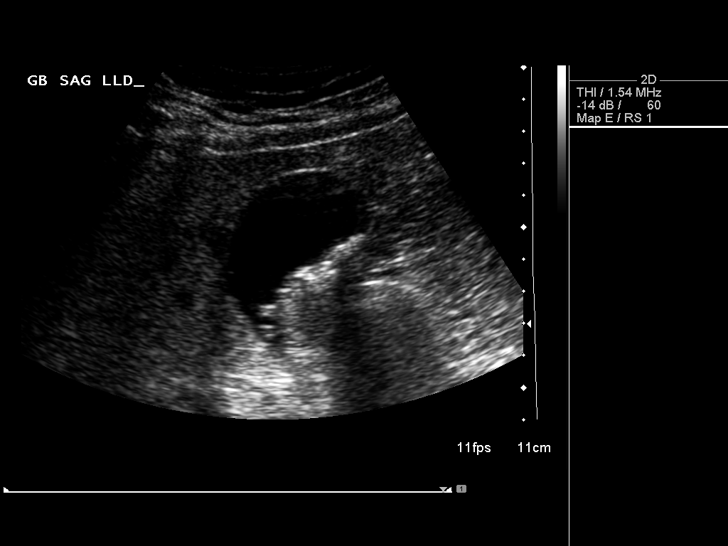
[im 56/90]
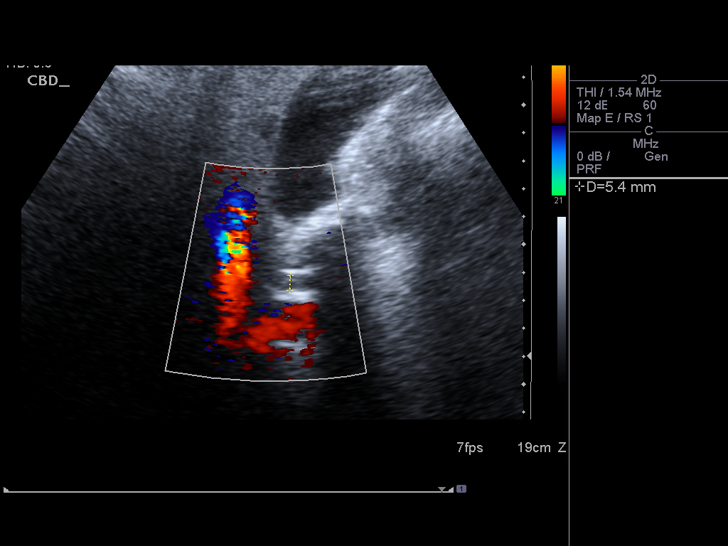
[im 60/90]
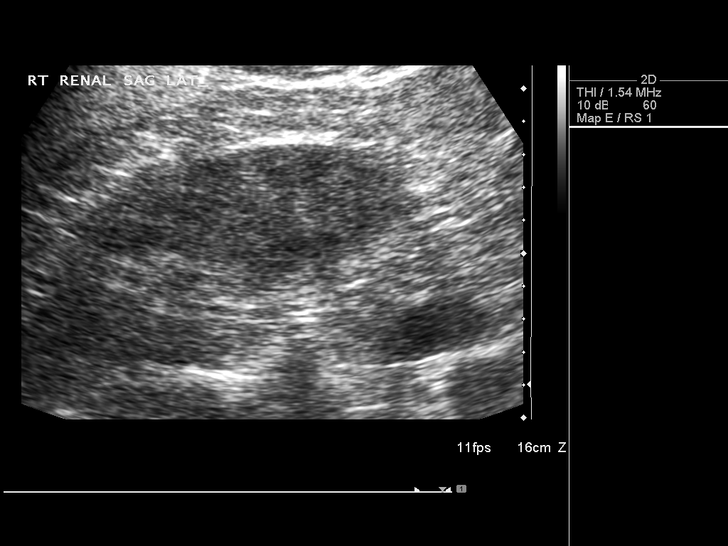
[im 67/90]
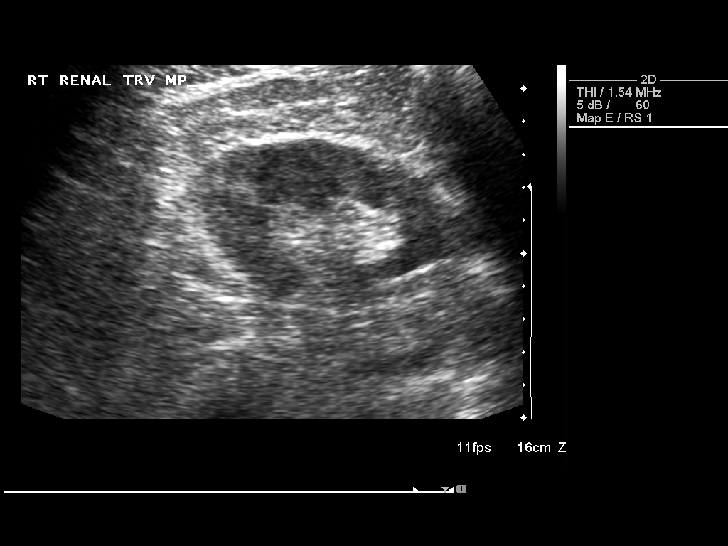
[im 75/90]
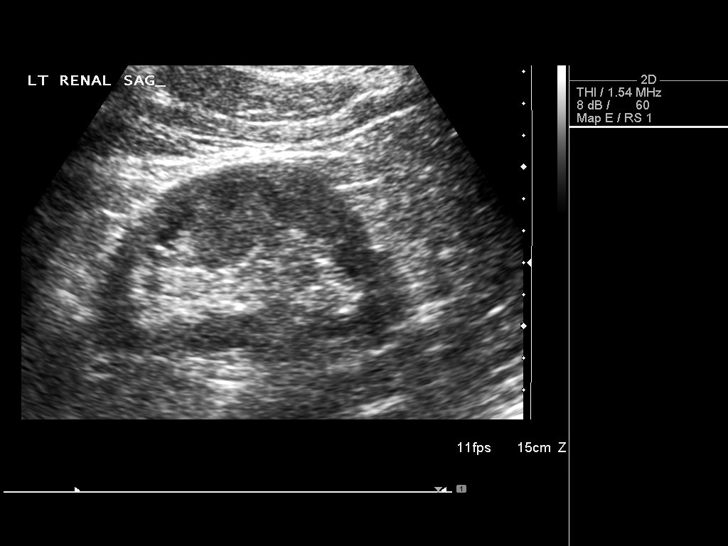
[im 82/90]
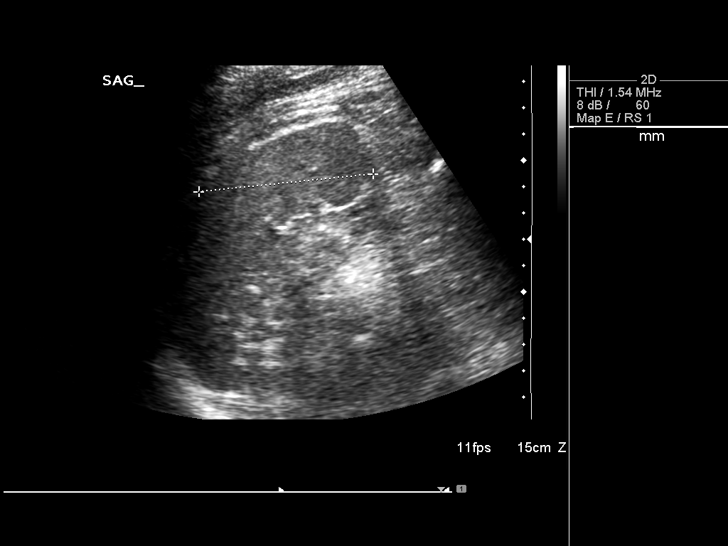
[im 90/90]
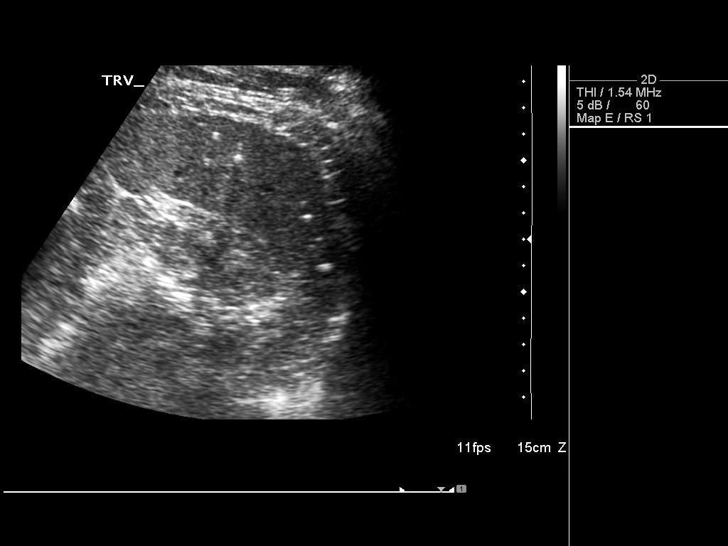

[14 of 25 positions shown; findings below may reference images not displayed]

FINDINGS: Gallbladder:  There are multiple small 2-4 mm mobile echogenic
gallstones.  No gallbladder wall thickening.  No pericholecystic
fluid.  Negative sonographic Murphy's sign.

Common bile duct:  Normal in diameter 5.24 mm

Liver:  The liver is dense in echotexture.  No ductal dilatation.

IVC:  Appears normal.

Pancreas:  No focal abnormality seen.

Spleen:  Multiple small echogenic foci within the spleen.

Right Kidney:  11.7cm in length.  No evidence of hydronephrosis or
stones.

Left Kidney:  10.2cm in length.  No evidence of hydronephrosis or
stones.

Abdominal aorta:  Abdominal aorta measures 3.0 cm.
IMPRESSION: 1.  Multiple gallstones without secondary signs of acute
cholecystitis.
2.  Common bile duct is at the upper limits of normal.
3.  Echogenic liver commonly represents hepatic steatosis.
4.  Abdominal aorta measures 3 cm.

## 2012-11-15 ENCOUNTER — Other Ambulatory Visit: Payer: Self-pay | Admitting: Endocrinology

## 2012-11-15 ENCOUNTER — Other Ambulatory Visit: Payer: Self-pay | Admitting: *Deleted

## 2012-11-15 MED ORDER — BENAZEPRIL HCL 40 MG PO TABS: 40.0000 mg | ORAL_TABLET | Freq: Every day | ORAL | Status: DC

## 2012-11-15 MED ORDER — OMEPRAZOLE 20 MG PO CPDR: 20.0000 mg | DELAYED_RELEASE_CAPSULE | Freq: Two times a day (BID) | ORAL | Status: DC

## 2012-11-15 MED ORDER — METFORMIN HCL ER 500 MG PO TB24: ORAL_TABLET | ORAL | Status: DC

## 2012-11-16 MED ORDER — LEVOTHYROXINE SODIUM 150 MCG PO TABS: 150.0000 ug | ORAL_TABLET | Freq: Every day | ORAL | Status: DC

## 2012-11-17 ENCOUNTER — Telehealth: Payer: Self-pay | Admitting: Endocrinology

## 2012-11-17 DIAGNOSIS — E119 Type 2 diabetes mellitus without complications: Secondary | ICD-10-CM | POA: Diagnosis not present

## 2012-11-17 NOTE — Telephone Encounter (Signed)
Pt states he has "got tne rx straightened out".

## 2012-11-17 NOTE — Telephone Encounter (Signed)
Per message from ans service, our office sent RX for Synthroid but it should have been generic. Please contact pharmacy and OK generic / Sherri S.

## 2012-11-29 DIAGNOSIS — L570 Actinic keratosis: Secondary | ICD-10-CM | POA: Diagnosis not present

## 2012-12-03 DIAGNOSIS — S43429A Sprain of unspecified rotator cuff capsule, initial encounter: Secondary | ICD-10-CM | POA: Diagnosis not present

## 2012-12-07 DIAGNOSIS — Z23 Encounter for immunization: Secondary | ICD-10-CM | POA: Diagnosis not present

## 2012-12-10 DIAGNOSIS — L259 Unspecified contact dermatitis, unspecified cause: Secondary | ICD-10-CM | POA: Diagnosis not present

## 2012-12-10 DIAGNOSIS — L570 Actinic keratosis: Secondary | ICD-10-CM | POA: Diagnosis not present

## 2012-12-22 DIAGNOSIS — I498 Other specified cardiac arrhythmias: Secondary | ICD-10-CM | POA: Diagnosis not present

## 2013-01-13 ENCOUNTER — Other Ambulatory Visit: Payer: Self-pay

## 2013-01-16 ENCOUNTER — Other Ambulatory Visit: Payer: Self-pay | Admitting: Internal Medicine

## 2013-01-27 DIAGNOSIS — L570 Actinic keratosis: Secondary | ICD-10-CM | POA: Diagnosis not present

## 2013-01-27 DIAGNOSIS — D485 Neoplasm of uncertain behavior of skin: Secondary | ICD-10-CM | POA: Diagnosis not present

## 2013-02-16 DIAGNOSIS — E119 Type 2 diabetes mellitus without complications: Secondary | ICD-10-CM | POA: Diagnosis not present

## 2013-02-16 DIAGNOSIS — I10 Essential (primary) hypertension: Secondary | ICD-10-CM | POA: Diagnosis not present

## 2013-02-16 DIAGNOSIS — I471 Supraventricular tachycardia: Secondary | ICD-10-CM | POA: Diagnosis not present

## 2013-02-16 DIAGNOSIS — I4891 Unspecified atrial fibrillation: Secondary | ICD-10-CM | POA: Diagnosis not present

## 2013-02-21 DIAGNOSIS — C73 Malignant neoplasm of thyroid gland: Secondary | ICD-10-CM | POA: Diagnosis not present

## 2013-03-14 DIAGNOSIS — I059 Rheumatic mitral valve disease, unspecified: Secondary | ICD-10-CM | POA: Diagnosis not present

## 2013-03-14 DIAGNOSIS — I4891 Unspecified atrial fibrillation: Secondary | ICD-10-CM | POA: Diagnosis not present

## 2013-03-14 DIAGNOSIS — Z7901 Long term (current) use of anticoagulants: Secondary | ICD-10-CM | POA: Diagnosis not present

## 2013-03-16 DIAGNOSIS — I4892 Unspecified atrial flutter: Secondary | ICD-10-CM | POA: Diagnosis not present

## 2013-03-16 DIAGNOSIS — I10 Essential (primary) hypertension: Secondary | ICD-10-CM | POA: Diagnosis not present

## 2013-03-16 DIAGNOSIS — Z79899 Other long term (current) drug therapy: Secondary | ICD-10-CM | POA: Diagnosis not present

## 2013-03-16 DIAGNOSIS — E119 Type 2 diabetes mellitus without complications: Secondary | ICD-10-CM | POA: Diagnosis not present

## 2013-03-16 DIAGNOSIS — C73 Malignant neoplasm of thyroid gland: Secondary | ICD-10-CM | POA: Diagnosis not present

## 2013-03-16 DIAGNOSIS — I4891 Unspecified atrial fibrillation: Secondary | ICD-10-CM | POA: Diagnosis not present

## 2013-03-17 DIAGNOSIS — I440 Atrioventricular block, first degree: Secondary | ICD-10-CM | POA: Diagnosis not present

## 2013-03-17 DIAGNOSIS — J9819 Other pulmonary collapse: Secondary | ICD-10-CM | POA: Diagnosis not present

## 2013-03-17 DIAGNOSIS — I10 Essential (primary) hypertension: Secondary | ICD-10-CM | POA: Diagnosis not present

## 2013-03-17 DIAGNOSIS — E119 Type 2 diabetes mellitus without complications: Secondary | ICD-10-CM | POA: Diagnosis not present

## 2013-03-17 DIAGNOSIS — Z9889 Other specified postprocedural states: Secondary | ICD-10-CM

## 2013-03-17 DIAGNOSIS — C73 Malignant neoplasm of thyroid gland: Secondary | ICD-10-CM | POA: Diagnosis not present

## 2013-03-17 DIAGNOSIS — I4892 Unspecified atrial flutter: Secondary | ICD-10-CM | POA: Diagnosis not present

## 2013-03-17 DIAGNOSIS — Z79899 Other long term (current) drug therapy: Secondary | ICD-10-CM | POA: Diagnosis not present

## 2013-03-17 DIAGNOSIS — Z8679 Personal history of other diseases of the circulatory system: Secondary | ICD-10-CM | POA: Insufficient documentation

## 2013-03-17 DIAGNOSIS — I4891 Unspecified atrial fibrillation: Secondary | ICD-10-CM | POA: Diagnosis not present

## 2013-03-17 DIAGNOSIS — I517 Cardiomegaly: Secondary | ICD-10-CM | POA: Diagnosis not present

## 2013-03-21 ENCOUNTER — Ambulatory Visit: Payer: Medicare Other | Admitting: Internal Medicine

## 2013-03-22 ENCOUNTER — Other Ambulatory Visit (INDEPENDENT_AMBULATORY_CARE_PROVIDER_SITE_OTHER): Payer: Medicare Other

## 2013-03-22 ENCOUNTER — Encounter: Payer: Self-pay | Admitting: Internal Medicine

## 2013-03-22 ENCOUNTER — Ambulatory Visit (INDEPENDENT_AMBULATORY_CARE_PROVIDER_SITE_OTHER): Payer: Medicare Other | Admitting: Internal Medicine

## 2013-03-22 VITALS — BP 140/72 | HR 75 | Temp 98.3°F | Ht 72.0 in | Wt 233.5 lb

## 2013-03-22 DIAGNOSIS — E785 Hyperlipidemia, unspecified: Secondary | ICD-10-CM | POA: Diagnosis not present

## 2013-03-22 DIAGNOSIS — I10 Essential (primary) hypertension: Secondary | ICD-10-CM

## 2013-03-22 DIAGNOSIS — Z23 Encounter for immunization: Secondary | ICD-10-CM

## 2013-03-22 DIAGNOSIS — E119 Type 2 diabetes mellitus without complications: Secondary | ICD-10-CM

## 2013-03-22 LAB — CBC WITH DIFFERENTIAL/PLATELET
BASOS PCT: 0.9 % (ref 0.0–3.0)
Basophils Absolute: 0.1 10*3/uL (ref 0.0–0.1)
EOS ABS: 0.3 10*3/uL (ref 0.0–0.7)
EOS PCT: 3 % (ref 0.0–5.0)
HCT: 43.4 % (ref 39.0–52.0)
HEMOGLOBIN: 14.8 g/dL (ref 13.0–17.0)
Lymphocytes Relative: 17.4 % (ref 12.0–46.0)
Lymphs Abs: 1.5 10*3/uL (ref 0.7–4.0)
MCHC: 34.2 g/dL (ref 30.0–36.0)
MCV: 89.4 fl (ref 78.0–100.0)
MONO ABS: 0.6 10*3/uL (ref 0.1–1.0)
Monocytes Relative: 7.4 % (ref 3.0–12.0)
NEUTROS ABS: 6.2 10*3/uL (ref 1.4–7.7)
NEUTROS PCT: 71.3 % (ref 43.0–77.0)
Platelets: 309 10*3/uL (ref 150.0–400.0)
RBC: 4.85 Mil/uL (ref 4.22–5.81)
RDW: 12.8 % (ref 11.5–14.6)
WBC: 8.6 10*3/uL (ref 4.5–10.5)

## 2013-03-22 LAB — HEPATIC FUNCTION PANEL
ALT: 30 U/L (ref 0–53)
AST: 24 U/L (ref 0–37)
Albumin: 3.9 g/dL (ref 3.5–5.2)
Alkaline Phosphatase: 59 U/L (ref 39–117)
BILIRUBIN DIRECT: 0.2 mg/dL (ref 0.0–0.3)
BILIRUBIN TOTAL: 1.3 mg/dL — AB (ref 0.3–1.2)
Total Protein: 7.4 g/dL (ref 6.0–8.3)

## 2013-03-22 LAB — LIPID PANEL
CHOLESTEROL: 184 mg/dL (ref 0–200)
HDL: 33.9 mg/dL — ABNORMAL LOW (ref >39.00)
LDL CALC: 119 mg/dL — AB (ref 0–99)
Total CHOL/HDL Ratio: 5
Triglycerides: 155 mg/dL — ABNORMAL HIGH (ref 0.0–149.0)
VLDL: 31 mg/dL (ref 0.0–40.0)

## 2013-03-22 LAB — URINALYSIS, ROUTINE W REFLEX MICROSCOPIC
Bilirubin Urine: NEGATIVE
HGB URINE DIPSTICK: NEGATIVE
Ketones, ur: NEGATIVE
Leukocytes, UA: NEGATIVE
NITRITE: NEGATIVE
PH: 5.5 (ref 5.0–8.0)
Specific Gravity, Urine: >=1.03 — AB (ref 1.000–1.030)
TOTAL PROTEIN, URINE-UPE24: NEGATIVE
URINE GLUCOSE: 250 — AB
Urobilinogen, UA: 0.2 (ref 0.0–1.0)

## 2013-03-22 LAB — BASIC METABOLIC PANEL
BUN: 26 mg/dL — ABNORMAL HIGH (ref 6–23)
CHLORIDE: 104 meq/L (ref 96–112)
CO2: 26 mEq/L (ref 19–32)
CREATININE: 1.3 mg/dL (ref 0.4–1.5)
Calcium: 10 mg/dL (ref 8.4–10.5)
GFR: 55.76 mL/min — ABNORMAL LOW (ref >60.00)
Glucose, Bld: 139 mg/dL — ABNORMAL HIGH (ref 70–99)
Potassium: 4.1 mEq/L (ref 3.5–5.1)
Sodium: 138 mEq/L (ref 135–145)

## 2013-03-22 LAB — TSH: TSH: 0.06 u[IU]/mL — ABNORMAL LOW (ref 0.35–5.50)

## 2013-03-22 LAB — HEMOGLOBIN A1C: Hgb A1c MFr Bld: 6.6 % — ABNORMAL HIGH (ref 4.6–6.5)

## 2013-03-22 MED ORDER — PRAVASTATIN SODIUM 10 MG PO TABS: 10.0000 mg | ORAL_TABLET | Freq: Every day | ORAL | Status: DC

## 2013-03-22 MED ORDER — BENAZEPRIL HCL 40 MG PO TABS: 40.0000 mg | ORAL_TABLET | Freq: Every day | ORAL | Status: DC

## 2013-03-22 MED ORDER — METFORMIN HCL ER 500 MG PO TB24: ORAL_TABLET | ORAL | Status: DC

## 2013-03-22 NOTE — Progress Notes (Signed)
Pre-visit discussion using our clinic review tool. No additional management support is needed unless otherwise documented below in the visit note.  

## 2013-03-22 NOTE — Addendum Note (Signed)
Addended by: Sharon Seller B on: 03/22/2013 10:23 AM   Modules accepted: Orders

## 2013-03-22 NOTE — Progress Notes (Signed)
Subjective:    Patient ID: Nathan Russo, male    DOB: 18-Oct-1940, 73 y.o.   MRN: [id number]  HPI  Here to f/u; overall doing ok,  Pt denies chest pain, increased sob or doe, wheezing, orthopnea, PND, increased LE swelling, palpitations, dizziness or syncope.  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 Only taking 3 metformin per day since last visit.  Pt denies new neurological symptoms such as new headache, or facial or extremity weakness or numbness.   Pt states overall good compliance with meds, has been trying to follow lower cholesterol, diabetic diet, with wt overall stable,  but little exercise however.  Had myalgia with lovastatin but willing to try another statin.  Eliquis over $500 for 90 days but still taking Past Medical History  Diagnosis Date  . THYROID NODULE 02/07/2010  . DIABETES MELLITUS, TYPE II 04/28/2007  . HYPERLIPIDEMIA 04/28/2007  . OBESITY 10/24/2006  . CARPAL TUNNEL SYNDROME, BILATERAL 02/07/2009  . OTITIS MEDIA, ACUTE, LEFT 12/26/2009  . HYPERTENSION 10/21/2006  . BRADYCARDIA, CHRONIC 10/24/2006  . GERD 02/11/2009  . PHIMOSIS 02/07/2009  . DEGENERATIVE JOINT DISEASE, RIGHT KNEE 10/24/2006  . Dizziness and giddiness 12/19/2009  . FATIGUE 04/28/2007  . Headache(784.0) 12/19/2009  . NECK MASS 02/07/2010  . CHEST PAIN-UNSPECIFIED 09/05/2008  . COLONIC POLYPS, HX OF 04/28/2007  . Abdominal pain, epigastric 04/11/2010  . CHOLELITHIASIS 04/22/2010  . BELCHING 04/23/2010  . Thyroid cancer 06/13/2010  . Cholelithiasis 06/13/2010  . Elevated PSA 06/13/2010  . S/P laparoscopic cholecystectomy 10/31/2010  . Depression 02/23/2011   Past Surgical History  Procedure Laterality Date  . Left knee surgery    . Right wrist surgury    . Cholecystectomy    . Thyroid surgery      reports that he has quit smoking. He has never used smokeless tobacco.  He reports that he does not drink alcohol or use illicit drugs. family history includes Arthritis in his mother; Cancer in his brother and sister; Colon cancer (age of onset: 33) in his brother; Diabetes in his brother; Goiter in his mother; Heart attack (age of onset: 71) in his brother; Heart attack (age of onset: 27) in his father; Hypertension in his mother; Hypothyroidism in his sister. Allergies  Allergen Reactions  . Ace Inhibitors     REACTION: cough  . Atenolol     REACTION: bradycardia  . Codeine Other (See Comments)    "wild"  . Lovastatin     REACTION: myalygros  . Sulfa Antibiotics Other (See Comments)    As child almost died   Current Outpatient Prescriptions on File Prior to Visit  Medication Sig Dispense Refill  . apixaban (ELIQUIS) 5 MG TABS tablet Take by mouth 2 (two) times daily.      . Ascorbic Acid (VITAMIN C) 1000 MG tablet Take 1,000 mg by mouth daily.        [name] aspirin 81 MG EC tablet Take 81 mg by mouth daily.        . benazepril (LOTENSIN) 40 MG tablet Take 1 tablet (40 mg total) by mouth daily.  90 tablet  3  . Blood Glucose Monitoring Suppl (ACCU-CHEK COMPACT CARE KIT) KIT by Does not apply route.        . calcium carbonate (OS-CAL) 600 MG TABS Take 600 mg by mouth daily.        [name]  Cinnamon 500 MG capsule Take 500 mg by mouth 2 (two) times daily.        [name] diltiazem (CARDIZEM CD) 120 MG 24 hr capsule Take 1 capsule (120 mg total) by mouth 2 (two) times daily.  180 capsule  3  . Flaxseed, Linseed, 1000 MG CAPS Take 1 capsule by mouth daily.      . flecainide (TAMBOCOR) 100 MG tablet Take 100 mg by mouth 2 (two) times daily.      . Garlic Oil (ODORLESS GARLIC) 111 MG TABS Take by mouth 2 (two) times daily.        . Ginger, Zingiber officinalis, (GINGER PO) Take by mouth 4 (four) times daily.        . Glucosamine-Chondroit-Vit C-Mn (GLUCOSAMINE CHONDROITIN COMPLX) CAPS Take by mouth daily.        . Glucose Blood (ACCU-CHEK INSTANT GLUCOSE TEST VI) by In Vitro  route 4 (four) times daily.        [name] levothyroxine (SYNTHROID, LEVOTHROID) 150 MCG tablet Take 1 tablet (150 mcg total) by mouth daily before breakfast.  90 tablet  3  . metFORMIN (GLUCOPHAGE-XR) 500 MG 24 hr tablet TAKE 1 TABLET EVERY MORNING 1 EVERY AFTERNOON AND 2 EVERY EVENING  360 tablet  2  . Multiple Vitamin (MULTIVITAMIN) capsule Take 1 capsule by mouth daily.        . Omega-3 Fatty Acids (FISH OIL) 1000 MG CAPS Take by mouth 2 (two) times daily.        [name] omeprazole (PRILOSEC) 20 MG capsule Take 1 capsule (20 mg total) by mouth 2 (two) times daily.  180 capsule  3  . sertraline (ZOLOFT) 100 MG tablet Take 1 tablet (100 mg total) by mouth daily.  90 tablet  3  . traMADol (ULTRAM) 50 MG tablet Take 1 tablet (50 mg total) by mouth every 6 (six) hours as needed. Maximum dose= 8 tablets per day  360 tablet  1  . VITAMIN D, CHOLECALCIFEROL, PO Take by mouth daily.         No current facility-administered medications on file prior to visit.   Review of Systems  Constitutional: Negative for unexpected weight change, or unusual diaphoresis  HENT: Negative for tinnitus.   Eyes: Negative for photophobia and visual disturbance.  Respiratory: Negative for choking and stridor.   Gastrointestinal: Negative for vomiting and blood in stool.  Genitourinary: Negative for hematuria and decreased urine volume.  Musculoskeletal: Negative for acute joint swelling Skin: Negative for color change and wound.  Neurological: Negative for tremors and numbness other than noted  Psychiatric/Behavioral: Negative for decreased concentration or  hyperactivity.       Objective:   Physical Exam        Assessment & Plan:

## 2013-03-22 NOTE — Patient Instructions (Addendum)
You had the new Prevnar pneumonia shot today Please take all new medication as prescribed  - the very low dose pravastatin 10 mg to see if you can take this ok (it usually is more tolerable) Please continue all other medications as before, and refills have been done if requested OK to continue the metformin at three times per day  Please go to the LAB in the Basement (turn left off the elevator) for the tests to be done today You will be contacted by phone if any changes need to be made immediately.  Otherwise, you will receive a letter about your results with an explanation, but please check with MyChart first.  Please return in 6 months, or sooner if needed

## 2013-03-28 ENCOUNTER — Encounter: Payer: Self-pay | Admitting: Internal Medicine

## 2013-03-28 ENCOUNTER — Ambulatory Visit (INDEPENDENT_AMBULATORY_CARE_PROVIDER_SITE_OTHER): Payer: Medicare Other | Admitting: Internal Medicine

## 2013-03-28 VITALS — BP 160/90 | HR 86 | Temp 98.3°F | Wt 236.0 lb

## 2013-03-28 DIAGNOSIS — R059 Cough, unspecified: Secondary | ICD-10-CM | POA: Diagnosis not present

## 2013-03-28 DIAGNOSIS — R05 Cough: Secondary | ICD-10-CM

## 2013-03-28 MED ORDER — BENZONATATE 100 MG PO CAPS: 100.0000 mg | ORAL_CAPSULE | Freq: Three times a day (TID) | ORAL | Status: DC

## 2013-03-28 NOTE — Progress Notes (Signed)
Subjective:    Patient ID: Nathan Russo, male    DOB: [birth year], 73 y.o.   MRN: [id number]  HPI Nathan Russo twice yesterday and had the sudden onset of cough, headache, felt like he had a fever, mild queasy stomach. He had thick white sputum. No significant shortness of breath. No vomiting, no diarrhea - has chronic loose stool since GB surgery. His blood pressure is elevated today from his baseline which is unusual. He has taken coricidin - (DM and guafenesin). No change urination.   He had an ablation procedure Dr. Ola Spurr in [place], for atrial fibrillation. He continues on Eliquis and Flecainide - [facility], previously seen at [facility]. He is s/p DC cardioversion x3 and chemical conversion x 2.   Past Medical History  Diagnosis Date  . THYROID NODULE 02/07/2010  . DIABETES MELLITUS, TYPE II 04/28/2007  . HYPERLIPIDEMIA 04/28/2007  . OBESITY 10/24/2006  . CARPAL TUNNEL SYNDROME, BILATERAL 02/07/2009  . OTITIS MEDIA, ACUTE, LEFT 12/26/2009  . HYPERTENSION 10/21/2006  . BRADYCARDIA, CHRONIC 10/24/2006  . GERD 02/11/2009  . PHIMOSIS 02/07/2009  . DEGENERATIVE JOINT DISEASE, RIGHT KNEE 10/24/2006  . Dizziness and giddiness 12/19/2009  . FATIGUE 04/28/2007  . Headache(784.0) 12/19/2009  . NECK MASS 02/07/2010  . CHEST PAIN-UNSPECIFIED 09/05/2008  . COLONIC POLYPS, HX OF 04/28/2007  . Abdominal pain, epigastric 04/11/2010  . CHOLELITHIASIS 04/22/2010  . BELCHING 04/23/2010  . Thyroid cancer 06/13/2010  . Cholelithiasis 06/13/2010  . Elevated PSA 06/13/2010  . S/P laparoscopic cholecystectomy 10/31/2010  . Depression 02/23/2011   Past Surgical History  Procedure Laterality Date  . Left knee surgery    . Right wrist surgury    . Cholecystectomy    . Thyroid surgery     Family History  Problem Relation Age of Onset  . Hypertension Mother   . Arthritis Mother   . Goiter Mother   . Heart attack Father 67  . Cancer Sister     breast  . Hypothyroidism Sister   . Heart  attack Brother 54  . Diabetes Brother   . Cancer Brother     colon  . Colon cancer Brother 42   History   Social History  . Marital Status: Married    Spouse Name: N/A    Number of Children: 3  . Years of Education: N/A   Occupational History  . former Event organiser     Social History Main Topics  . Smoking status: Former Research scientist (life sciences)  . Smokeless tobacco: Never Used  . Alcohol Use: No  . Drug Use: No  . Sexual Activity: Not on file   Other Topics Concern  . Not on file   Social History Narrative  . No narrative on file    Current Outpatient Prescriptions on File Prior to Visit  Medication Sig Dispense Refill  . apixaban (ELIQUIS) 5 MG TABS tablet Take by mouth 2 (two) times daily.      . Ascorbic Acid (VITAMIN C) 1000 MG tablet Take 1,000 mg by mouth daily.        [name] aspirin 81 MG EC tablet Take 81 mg by mouth daily.        . benazepril (LOTENSIN) 40 MG tablet Take 1 tablet (40 mg total) by mouth daily.  90 tablet  3  . Blood Glucose Monitoring Suppl (ACCU-CHEK COMPACT CARE KIT) KIT by Does not apply route.        . calcium carbonate (OS-CAL) 600 MG TABS Take 600 mg  by mouth daily.        . Cinnamon 500 MG capsule Take 500 mg by mouth 2 (two) times daily.        [name] diltiazem (CARDIZEM CD) 120 MG 24 hr capsule Take 1 capsule (120 mg total) by mouth 2 (two) times daily.  180 capsule  3  . Flaxseed, Linseed, 1000 MG CAPS Take 1 capsule by mouth daily.      . flecainide (TAMBOCOR) 100 MG tablet Take 100 mg by mouth 2 (two) times daily.      . Garlic Oil (ODORLESS GARLIC) 283 MG TABS Take by mouth 2 (two) times daily.        . Ginger, Zingiber officinalis, (GINGER PO) Take by mouth 4 (four) times daily.        . Glucosamine-Chondroit-Vit C-Mn (GLUCOSAMINE CHONDROITIN COMPLX) CAPS Take by mouth daily.        . Glucose Blood (ACCU-CHEK INSTANT GLUCOSE TEST VI) by In Vitro route 4 (four) times daily.        [name] levothyroxine (SYNTHROID, LEVOTHROID) 150 MCG  tablet Take 1 tablet (150 mcg total) by mouth daily before breakfast.  90 tablet  3  . metFORMIN (GLUCOPHAGE-XR) 500 MG 24 hr tablet 1 tab by mouth three times per day  270 tablet  3  . Multiple Vitamin (MULTIVITAMIN) capsule Take 1 capsule by mouth daily.        . Omega-3 Fatty Acids (FISH OIL) 1000 MG CAPS Take by mouth 2 (two) times daily.        [name] omeprazole (PRILOSEC) 20 MG capsule Take 1 capsule (20 mg total) by mouth 2 (two) times daily.  180 capsule  3  . pravastatin (PRAVACHOL) 10 MG tablet Take 1 tablet (10 mg total) by mouth daily.  90 tablet  3  . sertraline (ZOLOFT) 100 MG tablet Take 1 tablet (100 mg total) by mouth daily.  90 tablet  3  . traMADol (ULTRAM) 50 MG tablet Take 1 tablet (50 mg total) by mouth every 6 (six) hours as needed. Maximum dose= 8 tablets per day  360 tablet  1  . VITAMIN D, CHOLECALCIFEROL, PO Take by mouth daily.         No current facility-administered medications on file prior to visit.     Review of Systems System review is negative for any constitutional, cardiac, pulmonary, GI or neuro symptoms or complaints other than as described in the HPI.     Objective:   Physical Exam Filed Vitals:   03/28/13 1439  BP: 160/90  Pulse: 86  Temp: 98.3 F (36.8 C)   Gen'l- WNWD man in no distress HEENT- TMs normal, throat clear Nodes - no cervical adenopathy Cor 2+ radial, RRR Pulm - Clear to auscultation       Assessment & Plan:  1. Cough - may be a viral infection, possibly a bronchitis with tracheal irritation as a cause of paroxysms of cough.  Plan  no antibiotic  Cough syrup - Robitussin DM or the equivalent  If needed for a persistent tickle cough in the back of the throat - benzonatate perle 3 times a day.  Hydrate  For frontal sinus headache - nasal saline spray; stove top vaporizer - Pot of water to boil, turn heat off, put in menthalatum or favorite, make a towel tent over the pot and inhale; may try over the counter Nasacort.  For any  persistent fever, change in sputum to colored or blood streaked, shortness of breath -  call for reassessement,  2. Urinary difficulty - men do best, when they have BPH, standing. Try using the urinal provided.

## 2013-03-28 NOTE — Progress Notes (Signed)
Pre visit review using our clinic review tool, if applicable. No additional management support is needed unless otherwise documented below in the visit note. 

## 2013-03-28 NOTE — Patient Instructions (Signed)
1. Cough - may be a viral infection, possibly a bronchitis with tracheal irritation as a cause of paroxysms of cough.  Plan  no antibiotic  Cough syrup - Robitussin DM or the equivalent  If needed for a persistent tickle cough in the back of the throat - benzonatate perle 3 times a day.  Hydrate  For frontal sinus headache - nasal saline spray; stove top vaporizer - Pot of water to boil, turn heat off, put in menthalatum or favorite, make a towel tent over the pot and inhale; may try over the counter Nasacort.  For any persistent fever, change in sputum to colored or blood streaked, shortness of breath - call for reassessement,  2. Urinary difficulty - men do best, when they have BPH, standing. Try using the urinal provided.

## 2013-04-04 ENCOUNTER — Telehealth: Payer: Self-pay | Admitting: Internal Medicine

## 2013-04-04 MED ORDER — HYDROCODONE-HOMATROPINE 5-1.5 MG/5ML PO SYRP
5.0000 mL | ORAL_SOLUTION | Freq: Three times a day (TID) | ORAL | Status: DC | PRN
Start: 2013-04-04 — End: 2013-04-05

## 2013-04-04 NOTE — Telephone Encounter (Signed)
Sent in Rx for hycodan. Called patient. He does report agitation with oral codeine as pain medication. He is advised that this should be better tolerated but to be careful and observant

## 2013-04-04 NOTE — Telephone Encounter (Signed)
04/04/2013  Pt called in asking could he have an rx for his coughing prescribed. [name] is pt's PCP, but Pt was seen on 1/19 by Dr. Linda Hedges for cough / discharge / viral.  Please contact pt to advise

## 2013-04-05 ENCOUNTER — Telehealth: Payer: Self-pay | Admitting: Internal Medicine

## 2013-04-05 ENCOUNTER — Telehealth: Payer: Self-pay

## 2013-04-05 MED ORDER — HYDROCODONE-HOMATROPINE 5-1.5 MG/5ML PO SYRP: 5.0000 mL | ORAL_SOLUTION | Freq: Three times a day (TID) | ORAL | Status: DC | PRN

## 2013-04-05 NOTE — Telephone Encounter (Signed)
Script printed to be faxed to [facility] [phone]

## 2013-04-05 NOTE — Telephone Encounter (Signed)
Heather at [facility] called and left message on the triage line. She was calling in regards to the patient's rx that was called in yesterday evening. She states that the Hydrocodone can no longer be called in. Please follow-up.

## 2013-04-05 NOTE — Telephone Encounter (Signed)
Script for Hycodan has to be picked up by the patient. I left a voicemail letting him know this. It can not be faxed or called in

## 2013-04-06 ENCOUNTER — Ambulatory Visit (INDEPENDENT_AMBULATORY_CARE_PROVIDER_SITE_OTHER)
Admission: RE | Admit: 2013-04-06 | Discharge: 2013-04-06 | Disposition: A | Discharge status: Home / Self Care | Payer: Medicare Other | Source: Ambulatory Visit | Attending: Internal Medicine | Admitting: Internal Medicine

## 2013-04-06 ENCOUNTER — Other Ambulatory Visit (INDEPENDENT_AMBULATORY_CARE_PROVIDER_SITE_OTHER): Payer: Medicare Other

## 2013-04-06 ENCOUNTER — Encounter: Payer: Self-pay | Admitting: Internal Medicine

## 2013-04-06 ENCOUNTER — Ambulatory Visit (INDEPENDENT_AMBULATORY_CARE_PROVIDER_SITE_OTHER): Payer: Medicare Other | Admitting: Internal Medicine

## 2013-04-06 VITALS — BP 104/70 | HR 78 | Temp 98.2°F | Wt 229.0 lb

## 2013-04-06 DIAGNOSIS — I10 Essential (primary) hypertension: Secondary | ICD-10-CM

## 2013-04-06 DIAGNOSIS — J189 Pneumonia, unspecified organism: Secondary | ICD-10-CM

## 2013-04-06 DIAGNOSIS — J984 Other disorders of lung: Secondary | ICD-10-CM | POA: Diagnosis not present

## 2013-04-06 DIAGNOSIS — R972 Elevated prostate specific antigen [PSA]: Secondary | ICD-10-CM | POA: Diagnosis not present

## 2013-04-06 LAB — CBC WITH DIFFERENTIAL/PLATELET
BASOS PCT: 0.5 % (ref 0.0–3.0)
Basophils Absolute: 0 10*3/uL (ref 0.0–0.1)
Eosinophils Absolute: 0.2 10*3/uL (ref 0.0–0.7)
Eosinophils Relative: 2.4 % (ref 0.0–5.0)
HEMATOCRIT: 44 % (ref 39.0–52.0)
HEMOGLOBIN: 14.4 g/dL (ref 13.0–17.0)
LYMPHS PCT: 20.6 % (ref 12.0–46.0)
Lymphs Abs: 1.9 10*3/uL (ref 0.7–4.0)
MCHC: 32.8 g/dL (ref 30.0–36.0)
MCV: 91.5 fl (ref 78.0–100.0)
MONOS PCT: 7.8 % (ref 3.0–12.0)
Monocytes Absolute: 0.7 10*3/uL (ref 0.1–1.0)
Neutro Abs: 6.4 10*3/uL (ref 1.4–7.7)
Neutrophils Relative %: 68.7 % (ref 43.0–77.0)
PLATELETS: 401 10*3/uL — AB (ref 150.0–400.0)
RBC: 4.81 Mil/uL (ref 4.22–5.81)
RDW: 12.9 % (ref 11.5–14.6)
WBC: 9.3 10*3/uL (ref 4.5–10.5)

## 2013-04-06 LAB — BASIC METABOLIC PANEL
BUN: 26 mg/dL — AB (ref 6–23)
CO2: 25 mEq/L (ref 19–32)
CREATININE: 1.4 mg/dL (ref 0.4–1.5)
Calcium: 9.6 mg/dL (ref 8.4–10.5)
Chloride: 104 mEq/L (ref 96–112)
GFR: 52.12 mL/min — AB (ref >60.00)
Glucose, Bld: 114 mg/dL — ABNORMAL HIGH (ref 70–99)
POTASSIUM: 4.3 meq/L (ref 3.5–5.1)
Sodium: 136 mEq/L (ref 135–145)

## 2013-04-06 MED ORDER — HYDROCODONE-HOMATROPINE 5-1.5 MG/5ML PO SYRP: 5.0000 mL | ORAL_SOLUTION | Freq: Three times a day (TID) | ORAL | Status: DC | PRN

## 2013-04-06 MED ORDER — LEVOFLOXACIN 250 MG PO TABS: 250.0000 mg | ORAL_TABLET | Freq: Every day | ORAL | Status: DC

## 2013-04-06 MED ORDER — CEFTRIAXONE SODIUM 1 G IJ SOLR
1.0000 g | Freq: Once | INTRAMUSCULAR | Status: AC
  Administered 2013-04-06: 1 g via INTRAMUSCULAR

## 2013-04-06 NOTE — Assessment & Plan Note (Signed)
Clinical dx, for rocephin IM today, and levaquin oral course for home, cough med, cxr and cbc today

## 2013-04-06 NOTE — Patient Instructions (Addendum)
You had the antibiotic shot today Please take all new medication as prescribed - the antibiotic, cough medicine OK to HOLD on taking the benazepril Your blood pressure did not fall with standing today Please check your blood pressures as you do; if the top number is fairly consistently about 150 for a few days, you should re-start the benazepril Please continue all other medications as before, and refills have been done if requested. Please have the pharmacy call with any other refills you may need.  Please go to the XRAY Department in the Basement (go straight as you get off the elevator) for the x-ray testing  Please go to the LAB in the Basement (turn left off the elevator) for the tests to be done today  You will be contacted by phone if any changes need to be made immediately.  Otherwise, you will receive a letter about your results with an explanation, but please check with MyChart first.  Please remember to sign up for My Chart if you have not done so, as this will be important to you in the future with finding out test results, communicating by private email, and scheduling acute appointments online when needed.

## 2013-04-06 NOTE — Progress Notes (Signed)
Pre-visit discussion using our clinic review tool. No additional management support is needed unless otherwise documented below in the visit note.  

## 2013-04-06 NOTE — Assessment & Plan Note (Signed)
Borderline low today, though not tachy or febrile - no sirs; for holding benazepril 40 for now to avoid hypotension, check orthostatics today, pt checks BP freq at home almost daily, we discussed if he progresses as hoped, his BP should increase again in the next wk, and if consistently > 150 should restart the benazepril; also for bmet today

## 2013-04-06 NOTE — Progress Notes (Signed)
 Subjective:    Patient ID: Nathan Russo, male    DOB: 11/18/1940, 73 y.o.   MRN: [id number]  HPI  Here feeling quite poorly; was seen recently with clinical s/s viral illness, but now pt feels worse with feverish, prod cough, mild doe (no sob at rest) as well as signficant general weakness and malaise.  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  No falls, Pt denies chest pain, increased sob or doe, wheezing, orthopnea, PND, increased LE swelling, palpitations, syncope, but has been a bit dizzy and slightly staggery, walking with cane Past Medical History  Diagnosis Date  . THYROID NODULE 02/07/2010  . DIABETES MELLITUS, TYPE II 04/28/2007  . HYPERLIPIDEMIA 04/28/2007  . OBESITY 10/24/2006  . CARPAL TUNNEL SYNDROME, BILATERAL 02/07/2009  . OTITIS MEDIA, ACUTE, LEFT 12/26/2009  . HYPERTENSION 10/21/2006  . BRADYCARDIA, CHRONIC 10/24/2006  . GERD 02/11/2009  . PHIMOSIS 02/07/2009  . DEGENERATIVE JOINT DISEASE, RIGHT KNEE 10/24/2006  . Dizziness and giddiness 12/19/2009  . FATIGUE 04/28/2007  . Headache(784.0) 12/19/2009  . NECK MASS 02/07/2010  . CHEST PAIN-UNSPECIFIED 09/05/2008  . COLONIC POLYPS, HX OF 04/28/2007  . Abdominal pain, epigastric 04/11/2010  . CHOLELITHIASIS 04/22/2010  . BELCHING 04/23/2010  . Thyroid cancer 06/13/2010  . Cholelithiasis 06/13/2010  . Elevated PSA 06/13/2010  . S/P laparoscopic cholecystectomy 10/31/2010  . Depression 02/23/2011   Past Surgical History  Procedure Laterality Date  . Left knee surgery    . Right wrist surgury    . Cholecystectomy    . Thyroid surgery      reports that he has quit smoking. He has never used smokeless tobacco. He reports that he does not drink alcohol or use illicit drugs. family history includes Arthritis in his mother; Cancer in his brother and sister; Colon cancer (age of onset: 60) in his brother; Diabetes in his brother; Goiter in his mother; Heart attack (age of onset: 77) in his brother; Heart attack (age of  onset: 88) in his father; Hypertension in his mother; Hypothyroidism in his sister. Allergies  Allergen Reactions  . Ace Inhibitors     REACTION: cough  . Atenolol     REACTION: bradycardia  . Codeine Other (See Comments)    "wild"  . Lovastatin     REACTION: myalygros  . Sulfa Antibiotics Other (See Comments)    As child almost died  . Tramadol     Ants crawling all over   Current Outpatient Prescriptions on File Prior to Visit  Medication Sig Dispense Refill  . apixaban (ELIQUIS) 5 MG TABS tablet Take by mouth 2 (two) times daily.      . Ascorbic Acid (VITAMIN C) 1000 MG tablet Take 1,000 mg by mouth daily.        . aspirin 81 MG EC tablet Take 81 mg by mouth daily.        . benazepril (LOTENSIN) 40 MG tablet Take 1 tablet (40 mg total) by mouth daily.  90 tablet  3  . benzonatate (TESSALON) 100 MG capsule Take 1 capsule (100 mg total) by mouth 3 (three) times daily.  30 capsule  1  . Blood Glucose Monitoring Suppl (ACCU-CHEK COMPACT CARE KIT) KIT by Does not apply route.        . calcium carbonate (OS-CAL) 600 MG TABS Take 600 mg by mouth daily.        . Cinnamon 500 MG capsule Take 500 mg by mouth 2 (two) times daily.        .   diltiazem (CARDIZEM CD) 120 MG 24 hr capsule Take 1 capsule (120 mg total) by mouth 2 (two) times daily.  180 capsule  3  . Flaxseed, Linseed, 1000 MG CAPS Take 1 capsule by mouth daily.      . flecainide (TAMBOCOR) 100 MG tablet Take 100 mg by mouth 2 (two) times daily.      . Garlic Oil (ODORLESS GARLIC) 500 MG TABS Take by mouth 2 (two) times daily.        . Ginger, Zingiber officinalis, (GINGER PO) Take by mouth 4 (four) times daily.        . Glucosamine-Chondroit-Vit C-Mn (GLUCOSAMINE CHONDROITIN COMPLX) CAPS Take by mouth daily.        . Glucose Blood (ACCU-CHEK INSTANT GLUCOSE TEST VI) by In Vitro route 4 (four) times daily.        . levothyroxine (SYNTHROID, LEVOTHROID) 150 MCG tablet Take 1 tablet (150 mcg total) by mouth daily before breakfast.   90 tablet  3  . metFORMIN (GLUCOPHAGE-XR) 500 MG 24 hr tablet 1 tab by mouth three times per day  270 tablet  3  . Multiple Vitamin (MULTIVITAMIN) capsule Take 1 capsule by mouth daily.        . Omega-3 Fatty Acids (FISH OIL) 1000 MG CAPS Take by mouth 2 (two) times daily.        . omeprazole (PRILOSEC) 20 MG capsule Take 1 capsule (20 mg total) by mouth 2 (two) times daily.  180 capsule  3  . pravastatin (PRAVACHOL) 10 MG tablet Take 1 tablet (10 mg total) by mouth daily.  90 tablet  3  . VITAMIN D, CHOLECALCIFEROL, PO Take by mouth daily.         No current facility-administered medications on file prior to visit.   Review of Systems  Constitutional: Negative for unexpected weight change, or unusual diaphoresis  HENT: Negative for tinnitus.   Eyes: Negative for photophobia and visual disturbance.  Respiratory: Negative for choking and stridor.   Gastrointestinal: Negative for vomiting and blood in stool.  Genitourinary: Negative for hematuria and decreased urine volume.  Musculoskeletal: Negative for acute joint swelling Skin: Negative for color change and wound.  Neurological: Negative for tremors and numbness other than noted  Psychiatric/Behavioral: Negative for decreased concentration or  hyperactivity.       Objective:   Physical Exam BP 112/72  Pulse 78  Temp(Src) 98.2 F (36.8 C) (Oral)  Wt 229 lb (103.874 kg)  SpO2 98% VS noted,  Constitutional: Pt appears well-developed and well-nourished.  HENT: Head: NCAT.  Right Ear: External ear normal.  Left Ear: External ear normal.  Eyes: Conjunctivae and EOM are normal. Pupils are equal, round, and reactive to light.  Neck: Normal range of motion. Neck supple.  Cardiovascular: Normal rate and regular rhythm.   Pulmonary/Chest: Effort normal and breath sounds with prominent rales to left lower and mid lung fields, no wheezes  Abd:  Soft, NT, non-distended, + BS Neurological: Pt is alert. Not confused  Skin: Skin is warm. No  erythema.  Psychiatric: Pt behavior is normal. Thought content normal.     Assessment & Plan:   

## 2013-04-12 ENCOUNTER — Encounter: Payer: Self-pay | Admitting: Internal Medicine

## 2013-04-12 ENCOUNTER — Ambulatory Visit (INDEPENDENT_AMBULATORY_CARE_PROVIDER_SITE_OTHER): Payer: Medicare Other | Admitting: Internal Medicine

## 2013-04-12 VITALS — BP 100/62 | HR 71 | Temp 97.2°F | Wt 232.5 lb

## 2013-04-12 DIAGNOSIS — R5383 Other fatigue: Secondary | ICD-10-CM

## 2013-04-12 DIAGNOSIS — R5381 Other malaise: Secondary | ICD-10-CM | POA: Diagnosis not present

## 2013-04-12 DIAGNOSIS — R0989 Other specified symptoms and signs involving the circulatory and respiratory systems: Secondary | ICD-10-CM

## 2013-04-12 DIAGNOSIS — J189 Pneumonia, unspecified organism: Secondary | ICD-10-CM | POA: Diagnosis not present

## 2013-04-12 DIAGNOSIS — R0609 Other forms of dyspnea: Secondary | ICD-10-CM | POA: Diagnosis not present

## 2013-04-12 DIAGNOSIS — I959 Hypotension, unspecified: Secondary | ICD-10-CM

## 2013-04-12 MED ORDER — ALBUTEROL SULFATE HFA 108 (90 BASE) MCG/ACT IN AERS: 2.0000 | INHALATION_SPRAY | Freq: Four times a day (QID) | RESPIRATORY_TRACT | Status: DC | PRN

## 2013-04-12 NOTE — Patient Instructions (Addendum)
There is no evidence for dehydration today, and your oxygen with exertion is OK  Please take all new medication as prescribed - the Albuterol inhaler if not too expensive as this can help with the shortness of breath with exertion  You are given the instructions on inhaler use  OK to take HALF of the benazepril blood pressure medication for 1 wk, then to take the whole dose of 40 mg after that if your blood pressure is most often more than 120 (for the top number)  Please continue all other medications as before  Please remember to sign up for My Chart if you have not done so, as this will be important to you in the future with finding out test results, communicating by private email, and scheduling acute appointments online when needed.

## 2013-04-12 NOTE — Assessment & Plan Note (Signed)
Seems o/w usual fatigue assoc with signficant inflammatory illness; reassured, I dont think further labs/cxr indicated at this time

## 2013-04-12 NOTE — Assessment & Plan Note (Signed)
Overall improved, to cont levaquin and current, no changes at this time

## 2013-04-12 NOTE — Progress Notes (Signed)
Pre-visit discussion using our clinic review tool. No additional management support is needed unless otherwise documented below in the visit note.  

## 2013-04-12 NOTE — Progress Notes (Signed)
Subjective:    Patient ID: Nathan Russo, male    DOB: 1940/06/09, 73 y.o.   MRN: [id number]  HPI Here to f/u recent dx RUL pna, made appt yest but realizes today he overall is much improved and feels some sheepish for being here, as yesterday he seemed to "turn the corner" with less cough now less productive, right lower chest pain, decreased sob at rest , no wheezing; still some DOE and reduced stamina are his main complaints.  Recent cbc, bmet WNL Past Medical History  Diagnosis Date  . THYROID NODULE 02/07/2010  . DIABETES MELLITUS, TYPE II 04/28/2007  . HYPERLIPIDEMIA 04/28/2007  . OBESITY 10/24/2006  . CARPAL TUNNEL SYNDROME, BILATERAL 02/07/2009  . OTITIS MEDIA, ACUTE, LEFT 12/26/2009  . HYPERTENSION 10/21/2006  . BRADYCARDIA, CHRONIC 10/24/2006  . GERD 02/11/2009  . PHIMOSIS 02/07/2009  . DEGENERATIVE JOINT DISEASE, RIGHT KNEE 10/24/2006  . Dizziness and giddiness 12/19/2009  . FATIGUE 04/28/2007  . Headache(784.0) 12/19/2009  . NECK MASS 02/07/2010  . CHEST PAIN-UNSPECIFIED 09/05/2008  . COLONIC POLYPS, HX OF 04/28/2007  . Abdominal pain, epigastric 04/11/2010  . CHOLELITHIASIS 04/22/2010  . BELCHING 04/23/2010  . Thyroid cancer 06/13/2010  . Cholelithiasis 06/13/2010  . Elevated PSA 06/13/2010  . S/P laparoscopic cholecystectomy 10/31/2010  . Depression 02/23/2011   Past Surgical History  Procedure Laterality Date  . Left knee surgery    . Right wrist surgury    . Cholecystectomy    . Thyroid surgery      reports that he has quit smoking. He has never used smokeless tobacco. He reports that he does not drink alcohol or use illicit drugs. family history includes Arthritis in his mother; Cancer in his brother and sister; Colon cancer (age of onset: 65) in his brother; Diabetes in his brother; Goiter in his mother; Heart attack (age of onset: 75) in his brother; Heart attack (age of onset: 24) in his father; Hypertension in his mother; Hypothyroidism in his sister. Allergies  Allergen  Reactions  . Ace Inhibitors     REACTION: cough  . Atenolol     REACTION: bradycardia  . Codeine Other (See Comments)    Head spins "wild"  . Lovastatin     REACTION: myalygros  . Morphine Nausea And Vomiting  . Sulfa Antibiotics Other (See Comments)    As child almost died  . Tramadol     Ants crawling all over   Current Outpatient Prescriptions on File Prior to Visit  Medication Sig Dispense Refill  . apixaban (ELIQUIS) 5 MG TABS tablet Take by mouth 2 (two) times daily.      . Ascorbic Acid (VITAMIN C) 1000 MG tablet Take 1,000 mg by mouth daily.        [name] aspirin 81 MG EC tablet Take 81 mg by mouth daily.        . benazepril (LOTENSIN) 40 MG tablet Take 1 tablet (40 mg total) by mouth daily.  90 tablet  3  . benzonatate (TESSALON) 100 MG capsule Take 1 capsule (100 mg total) by mouth 3 (three) times daily.  30 capsule  1  . Blood Glucose Monitoring Suppl (ACCU-CHEK COMPACT CARE KIT) KIT by Does not apply route.        . calcium carbonate (OS-CAL) 600 MG TABS Take 600 mg by mouth daily.        . Cinnamon 500 MG capsule Take 500 mg by mouth 2 (two) times daily.        [name]  diltiazem (CARDIZEM CD) 120 MG 24 hr capsule Take 1 capsule (120 mg total) by mouth 2 (two) times daily.  180 capsule  3  . Flaxseed, Linseed, 1000 MG CAPS Take 1 capsule by mouth daily.      . flecainide (TAMBOCOR) 100 MG tablet Take 100 mg by mouth 2 (two) times daily.      . Garlic Oil (ODORLESS GARLIC) 250 MG TABS Take by mouth 2 (two) times daily.        . Ginger, Zingiber officinalis, (GINGER PO) Take by mouth 4 (four) times daily.        . Glucosamine-Chondroit-Vit C-Mn (GLUCOSAMINE CHONDROITIN COMPLX) CAPS Take by mouth daily.        . Glucose Blood (ACCU-CHEK INSTANT GLUCOSE TEST VI) by In Vitro route 4 (four) times daily.        [name] HYDROcodone-homatropine (HYCODAN) 5-1.5 MG/5ML syrup Take 5 mLs by mouth every 8 (eight) hours as needed for cough.  180 mL  0  . levofloxacin (LEVAQUIN) 250 MG tablet Take 1  tablet (250 mg total) by mouth daily.  10 tablet  0  . levothyroxine (SYNTHROID, LEVOTHROID) 150 MCG tablet Take 1 tablet (150 mcg total) by mouth daily before breakfast.  90 tablet  3  . metFORMIN (GLUCOPHAGE-XR) 500 MG 24 hr tablet 1 tab by mouth three times per day  270 tablet  3  . Multiple Vitamin (MULTIVITAMIN) capsule Take 1 capsule by mouth daily.        . Omega-3 Fatty Acids (FISH OIL) 1000 MG CAPS Take by mouth 2 (two) times daily.        [name] omeprazole (PRILOSEC) 20 MG capsule Take 1 capsule (20 mg total) by mouth 2 (two) times daily.  180 capsule  3  . pravastatin (PRAVACHOL) 10 MG tablet Take 1 tablet (10 mg total) by mouth daily.  90 tablet  3  . VITAMIN D, CHOLECALCIFEROL, PO Take by mouth daily.         No current facility-administered medications on file prior to visit.   Review of Systems  Constitutional: Negative for unexpected weight change, or unusual diaphoresis  HENT: Negative for tinnitus.   Eyes: Negative for photophobia and visual disturbance.  Respiratory: Negative for choking and stridor.   Gastrointestinal: Negative for vomiting and blood in stool.  Genitourinary: Negative for hematuria and decreased urine volume.  Musculoskeletal: Negative for acute joint swelling Skin: Negative for color change and wound.  Neurological: Negative for tremors and numbness other than noted  Psychiatric/Behavioral: Negative for decreased concentration or  hyperactivity.       Objective:   Physical Exam BP 100/62  Pulse 71  Temp(Src) 97.2 F (36.2 C) (Oral)  Wt 232 lb 8 oz (105.461 kg)  SpO2 98%; not orthostatic on exam VS noted,   Ambulatory sat - 94-95 at 200 ft, appears fatigued Constitutional: Pt appears well-developed and well-nourished.  HENT: Head: NCAT.  Right Ear: External ear normal.  Left Ear: External ear normal.  Eyes: Conjunctivae and EOM are normal. Pupils are equal, round, and reactive to light.  Neck: Normal range of motion. Neck supple.  Cardiovascular:  Normal rate and regular rhythm.   Pulmonary/Chest: Effort normal and breath sounds some decreased right lower fied.  Abd:  Soft, NT, non-distended, + BS Neurological: Pt is alert. Not confused  Skin: Skin is warm. No erythema.  Psychiatric: Pt behavior is normal. Thought content normal.     Assessment & Plan:

## 2013-04-12 NOTE — Assessment & Plan Note (Signed)
Ambulatory sat ok; for empiric alb MDI prn

## 2013-04-12 NOTE — Assessment & Plan Note (Signed)
Not orthostatic, likely borderline lower related to current illness; for HALF benazepril for at least one wk, then to resume 40 mg for SBP > 120 ; he is quite diligent at checking BP at home

## 2013-04-13 DIAGNOSIS — R972 Elevated prostate specific antigen [PSA]: Secondary | ICD-10-CM | POA: Diagnosis not present

## 2013-04-13 DIAGNOSIS — N139 Obstructive and reflux uropathy, unspecified: Secondary | ICD-10-CM | POA: Diagnosis not present

## 2013-04-13 DIAGNOSIS — N401 Enlarged prostate with lower urinary tract symptoms: Secondary | ICD-10-CM | POA: Diagnosis not present

## 2013-04-14 DIAGNOSIS — I10 Essential (primary) hypertension: Secondary | ICD-10-CM | POA: Diagnosis not present

## 2013-04-14 DIAGNOSIS — I4891 Unspecified atrial fibrillation: Secondary | ICD-10-CM | POA: Diagnosis not present

## 2013-04-14 DIAGNOSIS — I471 Supraventricular tachycardia: Secondary | ICD-10-CM | POA: Diagnosis not present

## 2013-04-14 DIAGNOSIS — E119 Type 2 diabetes mellitus without complications: Secondary | ICD-10-CM | POA: Diagnosis not present

## 2013-05-24 ENCOUNTER — Other Ambulatory Visit: Payer: Self-pay | Admitting: *Deleted

## 2013-05-24 ENCOUNTER — Other Ambulatory Visit: Payer: Self-pay

## 2013-05-24 ENCOUNTER — Telehealth: Payer: Self-pay | Admitting: Internal Medicine

## 2013-05-24 MED ORDER — LEVOTHYROXINE SODIUM 150 MCG PO TABS: 150.0000 ug | ORAL_TABLET | Freq: Every day | ORAL | Status: DC

## 2013-05-24 NOTE — Telephone Encounter (Signed)
Refill done to [facility] as requested by the patient.

## 2013-05-24 NOTE — Telephone Encounter (Signed)
Patient would like for his levothyroxine prescription to be sent to [place] in [place]. It is only $10 at [facility] and cost over $50 with [facility].

## 2013-05-27 DIAGNOSIS — I4891 Unspecified atrial fibrillation: Secondary | ICD-10-CM | POA: Diagnosis not present

## 2013-05-27 DIAGNOSIS — E119 Type 2 diabetes mellitus without complications: Secondary | ICD-10-CM | POA: Diagnosis not present

## 2013-05-27 DIAGNOSIS — I10 Essential (primary) hypertension: Secondary | ICD-10-CM | POA: Diagnosis not present

## 2013-06-06 DIAGNOSIS — I471 Supraventricular tachycardia: Secondary | ICD-10-CM | POA: Diagnosis not present

## 2013-06-06 DIAGNOSIS — I4891 Unspecified atrial fibrillation: Secondary | ICD-10-CM | POA: Diagnosis not present

## 2013-06-08 DIAGNOSIS — I4891 Unspecified atrial fibrillation: Secondary | ICD-10-CM | POA: Diagnosis not present

## 2013-06-24 DIAGNOSIS — I4891 Unspecified atrial fibrillation: Secondary | ICD-10-CM | POA: Diagnosis not present

## 2013-07-01 ENCOUNTER — Telehealth: Payer: Self-pay | Admitting: Internal Medicine

## 2013-07-01 NOTE — Telephone Encounter (Signed)
The TSH is low, but the interpretation should be made by the treating MD - Dr Loanne Drilling, who may feel this is approp for his case; I would ask pt to ask Dr Loanne Drilling for further questions

## 2013-07-01 NOTE — Telephone Encounter (Signed)
Patient advised.

## 2013-07-01 NOTE — Telephone Encounter (Signed)
Patient wants to know what [name] thinks about his last thyroid level recorded. Please call to advise.

## 2013-07-05 ENCOUNTER — Encounter: Payer: Self-pay | Admitting: Endocrinology

## 2013-07-05 ENCOUNTER — Ambulatory Visit (INDEPENDENT_AMBULATORY_CARE_PROVIDER_SITE_OTHER): Payer: Medicare Other | Admitting: Endocrinology

## 2013-07-05 VITALS — BP 132/88 | HR 60 | Temp 97.7°F | Ht 72.0 in | Wt 235.0 lb

## 2013-07-05 DIAGNOSIS — C73 Malignant neoplasm of thyroid gland: Secondary | ICD-10-CM

## 2013-07-05 DIAGNOSIS — E890 Postprocedural hypothyroidism: Secondary | ICD-10-CM

## 2013-07-05 LAB — TSH: TSH: 0.03 u[IU]/mL — ABNORMAL LOW (ref 0.35–5.50)

## 2013-07-05 NOTE — Patient Instructions (Addendum)
A thyroid blood test is requested for you today.  We'll contact you with results.   Please come back for a follow-up appointment in 6 months.   In view of your atrial fibrillation, let's shoot for a normal thyroid level.

## 2013-07-05 NOTE — Progress Notes (Signed)
Subjective:    Patient ID: Nathan Russo, male    DOB: Sep 08, 1940, 73 y.o.   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 3/12: thyroidectomy with left neck dissection multifocal papillary adenocarcinoma, largest focus 2.9 cm, at the isthmus, with bilateral foci, 1/11 nodes pos in central compartment, 4/44 nodes pos at levels 2-5 (largest 2 cm), extrathyroidal extension, and lymphatic invasion: T3 N1b M0 4/12: body scan, after thyroid hormone withdrawal: uptake 3%; 2 foci at the thyroid bed only. 4/12: i-131 rx [facility] 12/12: tg<0.2 (ab neg) 6/13: tg<0.2 (ab neg) 1/14: tg<0.2 (ab neg) He does not notice any lump in the neck. AF: in early 2015, it was again noted by cardiol at [facility] hospital.  he was started on flecanide.  He required repeat cardioversion.   Denies sob.  It recurred transiently when the flecanide was reduced in early 2015.   Postsurgical hypothyroidism: he denies excessive diaphoresis.  Past Medical History  Diagnosis Date  . THYROID NODULE 02/07/2010  . DIABETES MELLITUS, TYPE II 04/28/2007  . HYPERLIPIDEMIA 04/28/2007  . OBESITY 10/24/2006  . CARPAL TUNNEL SYNDROME, BILATERAL 02/07/2009  . OTITIS MEDIA, ACUTE, LEFT 12/26/2009  . HYPERTENSION 10/21/2006  . BRADYCARDIA, CHRONIC 10/24/2006  . GERD 02/11/2009  . PHIMOSIS 02/07/2009  . DEGENERATIVE JOINT DISEASE, RIGHT KNEE 10/24/2006  . Dizziness and giddiness 12/19/2009  . FATIGUE 04/28/2007  . Headache(784.0) 12/19/2009  . NECK MASS 02/07/2010  . CHEST PAIN-UNSPECIFIED 09/05/2008  . COLONIC POLYPS, HX OF 04/28/2007  . Abdominal pain, epigastric 04/11/2010  . CHOLELITHIASIS 04/22/2010  . BELCHING 04/23/2010  . Thyroid cancer 06/13/2010  . Cholelithiasis 06/13/2010  . Elevated PSA 06/13/2010  . S/P laparoscopic cholecystectomy 10/31/2010  . Depression 02/23/2011     Past Surgical History  Procedure Laterality Date  . Left knee surgery    . Right wrist surgury    . Cholecystectomy    . Thyroid surgery      History   Social History  . Marital Status: Married    Spouse Name: N/A    Number of Children: 3  . Years of Education: N/A   Occupational History  . former Event organiser     Social History Main Topics  . Smoking status: Former Research scientist (life sciences)  . Smokeless tobacco: Never Used  . Alcohol Use: No  . Drug Use: No  . Sexual Activity: Not on file   Other Topics Concern  . Not on file   Social History Narrative  . No narrative on file    Current Outpatient Prescriptions on File Prior to Visit  Medication Sig Dispense Refill  . albuterol (PROVENTIL HFA;VENTOLIN HFA) 108 (90 BASE) MCG/ACT inhaler Inhale 2 puffs into the lungs every 6 (six) hours as needed for wheezing or shortness of breath.  1 Inhaler  1  . apixaban (ELIQUIS) 5 MG TABS tablet Take by mouth 2 (two) times daily.      . Ascorbic Acid (VITAMIN C) 1000 MG tablet Take 1,000 mg by mouth daily.        [name] aspirin 81 MG EC tablet Take 81 mg by mouth daily.        . benazepril (LOTENSIN) 40 MG tablet Take 1 tablet (40 mg total) by mouth daily.  90 tablet  3  . benzonatate (TESSALON) 100 MG capsule Take 1 capsule (100 mg  total) by mouth 3 (three) times daily.  30 capsule  1  . Blood Glucose Monitoring Suppl (ACCU-CHEK COMPACT CARE KIT) KIT by Does not apply route.        . calcium carbonate (OS-CAL) 600 MG TABS Take 600 mg by mouth daily.        . Cinnamon 500 MG capsule Take 500 mg by mouth 2 (two) times daily.        [name] diltiazem (CARDIZEM CD) 120 MG 24 hr capsule Take 1 capsule (120 mg total) by mouth 2 (two) times daily.  180 capsule  3  . Flaxseed, Linseed, 1000 MG CAPS Take 1 capsule by mouth daily.      . flecainide (TAMBOCOR) 100 MG tablet Take 100 mg by mouth 2 (two) times daily.      . Garlic Oil (ODORLESS GARLIC) 681 MG TABS Take by mouth 2 (two) times  daily.        . Ginger, Zingiber officinalis, (GINGER PO) Take by mouth 4 (four) times daily.        . Glucosamine-Chondroit-Vit C-Mn (GLUCOSAMINE CHONDROITIN COMPLX) CAPS Take by mouth daily.        . Glucose Blood (ACCU-CHEK INSTANT GLUCOSE TEST VI) by In Vitro route 4 (four) times daily.        [name] HYDROcodone-homatropine (HYCODAN) 5-1.5 MG/5ML syrup Take 5 mLs by mouth every 8 (eight) hours as needed for cough.  180 mL  0  . levofloxacin (LEVAQUIN) 250 MG tablet Take 1 tablet (250 mg total) by mouth daily.  10 tablet  0  . metFORMIN (GLUCOPHAGE-XR) 500 MG 24 hr tablet 1 tab by mouth three times per day  270 tablet  3  . Multiple Vitamin (MULTIVITAMIN) capsule Take 1 capsule by mouth daily.        . Omega-3 Fatty Acids (FISH OIL) 1000 MG CAPS Take by mouth 2 (two) times daily.        [name] omeprazole (PRILOSEC) 20 MG capsule Take 1 capsule (20 mg total) by mouth 2 (two) times daily.  180 capsule  3  . pravastatin (PRAVACHOL) 10 MG tablet Take 1 tablet (10 mg total) by mouth daily.  90 tablet  3  . VITAMIN D, CHOLECALCIFEROL, PO Take by mouth daily.         No current facility-administered medications on file prior to visit.    Allergies  Allergen Reactions  . Ace Inhibitors     REACTION: cough  . Atenolol     REACTION: bradycardia  . Codeine Other (See Comments)    Head spins "wild"  . Lovastatin     REACTION: myalygros  . Morphine Nausea And Vomiting  . Sulfa Antibiotics Other (See Comments)    As child almost died  . Tramadol     Ants crawling all over    Family History  Problem Relation Age of Onset  . Hypertension Mother   . Arthritis Mother   . Goiter Mother   . Heart attack Father 62  . Cancer Sister     breast  . Hypothyroidism Sister   . Heart attack Brother 76  . Diabetes Brother   . Cancer Brother     colon  . Colon cancer Brother 60    BP 132/88  Pulse 60  Temp(Src) 97.7 F (36.5 C) (Oral)  Ht 6' (1.829 m)  Wt 235 lb (106.595 kg)  BMI 31.86 kg/m2  SpO2  94%  Review of Systems He has fatigue.  He has gained weight.  Objective:   Physical Exam VITAL SIGNS:  See vs page GENERAL: no distress Neck: a healed scar is present.  i do not appreciate a nodule in the thyroid or elsewhere in the neck.     Lab Results  Component Value Date   TSH 0.03* 07/05/2013   TG=undetectable    Assessment & Plan:  Stage-4a thyroid cancer, no evidence of recurrence.  Post-i-131 hypothyroidism.  Given the AF and long cancer-free interval, the synthroid can be reduced.  AF, ? Related to synthroid dosage Weight gain: not thyroid-related

## 2013-07-06 LAB — THYROGLOBULIN LEVEL: Thyroglobulin: <0.2 ng/mL (ref 0.0–55.0)

## 2013-07-06 LAB — THYROGLOBULIN ANTIBODY: Thyroglobulin Ab: <20 IU/mL (ref <40.0)

## 2013-07-06 MED ORDER — LEVOTHYROXINE SODIUM 125 MCG PO TABS: 125.0000 ug | ORAL_TABLET | Freq: Every day | ORAL | Status: DC

## 2013-07-08 DIAGNOSIS — I872 Venous insufficiency (chronic) (peripheral): Secondary | ICD-10-CM | POA: Diagnosis not present

## 2013-07-12 ENCOUNTER — Telehealth: Payer: Self-pay

## 2013-07-12 NOTE — Telephone Encounter (Signed)
Received a fax from [facility] stating they did not have any records of a Thyroid US.

## 2013-08-05 DIAGNOSIS — E119 Type 2 diabetes mellitus without complications: Secondary | ICD-10-CM | POA: Diagnosis not present

## 2013-08-05 DIAGNOSIS — I10 Essential (primary) hypertension: Secondary | ICD-10-CM | POA: Diagnosis not present

## 2013-08-05 DIAGNOSIS — I4891 Unspecified atrial fibrillation: Secondary | ICD-10-CM | POA: Diagnosis not present

## 2013-08-12 ENCOUNTER — Ambulatory Visit (INDEPENDENT_AMBULATORY_CARE_PROVIDER_SITE_OTHER): Payer: Medicare Other | Admitting: Endocrinology

## 2013-08-12 ENCOUNTER — Encounter: Payer: Self-pay | Admitting: Endocrinology

## 2013-08-12 VITALS — BP 122/70 | HR 59 | Temp 97.9°F | Ht 72.0 in | Wt 242.0 lb

## 2013-08-12 DIAGNOSIS — L723 Sebaceous cyst: Secondary | ICD-10-CM

## 2013-08-12 NOTE — Progress Notes (Signed)
Subjective:    Patient ID: Nathan Russo, male    DOB: 01-28-1941, 73 y.o.   MRN: [id number]  HPI Pt states few days of moderate nodule at the left side of the neck, but no assoc pain.  Past Medical History  Diagnosis Date  . THYROID NODULE 02/07/2010  . DIABETES MELLITUS, TYPE II 04/28/2007  . HYPERLIPIDEMIA 04/28/2007  . OBESITY 10/24/2006  . CARPAL TUNNEL SYNDROME, BILATERAL 02/07/2009  . OTITIS MEDIA, ACUTE, LEFT 12/26/2009  . HYPERTENSION 10/21/2006  . BRADYCARDIA, CHRONIC 10/24/2006  . GERD 02/11/2009  . PHIMOSIS 02/07/2009  . DEGENERATIVE JOINT DISEASE, RIGHT KNEE 10/24/2006  . Dizziness and giddiness 12/19/2009  . FATIGUE 04/28/2007  . Headache(784.0) 12/19/2009  . NECK MASS 02/07/2010  . CHEST PAIN-UNSPECIFIED 09/05/2008  . COLONIC POLYPS, HX OF 04/28/2007  . Abdominal pain, epigastric 04/11/2010  . CHOLELITHIASIS 04/22/2010  . BELCHING 04/23/2010  . Thyroid cancer 06/13/2010  . Cholelithiasis 06/13/2010  . Elevated PSA 06/13/2010  . S/P laparoscopic cholecystectomy 10/31/2010  . Depression 02/23/2011    Past Surgical History  Procedure Laterality Date  . Left knee surgery    . Right wrist surgury    . Cholecystectomy    . Thyroid surgery      History   Social History  . Marital Status: Married    Spouse Name: N/A    Number of Children: 3  . Years of Education: N/A   Occupational History  . former Event organiser     Social History Main Topics  . Smoking status: Former Research scientist (life sciences)  . Smokeless tobacco: Never Used  . Alcohol Use: No  . Drug Use: No  . Sexual Activity: Not on file   Other Topics Concern  . Not on file   Social History Narrative  . No narrative on file    Current Outpatient Prescriptions on File Prior to Visit  Medication Sig Dispense Refill  . albuterol (PROVENTIL HFA;VENTOLIN HFA) 108 (90 BASE) MCG/ACT inhaler Inhale 2 puffs into the lungs every 6 (six) hours as needed for wheezing or shortness of breath.  1 Inhaler  1  .  apixaban (ELIQUIS) 5 MG TABS tablet Take by mouth 2 (two) times daily.      . Ascorbic Acid (VITAMIN C) 1000 MG tablet Take 1,000 mg by mouth daily.        [name] aspirin 81 MG EC tablet Take 81 mg by mouth daily.        . benazepril (LOTENSIN) 40 MG tablet Take 1 tablet (40 mg total) by mouth daily.  90 tablet  3  . Blood Glucose Monitoring Suppl (ACCU-CHEK COMPACT CARE KIT) KIT by Does not apply route.        . calcium carbonate (OS-CAL) 600 MG TABS Take 600 mg by mouth daily.        . Cinnamon 500 MG capsule Take 500 mg by mouth 2 (two) times daily.        [name] diltiazem (CARDIZEM CD) 120 MG 24 hr capsule Take 1 capsule (120 mg total) by mouth 2 (two) times daily.  180 capsule  3  . Flaxseed, Linseed, 1000 MG CAPS Take 1 capsule by mouth daily.      . flecainide (TAMBOCOR) 100 MG tablet Take 100 mg by mouth 2 (two) times daily.      . Garlic Oil (ODORLESS GARLIC) 841 MG TABS Take by mouth 2 (two) times daily.        . Ginger, Zingiber officinalis, (GINGER PO) Take  by mouth 4 (four) times daily.        . Glucosamine-Chondroit-Vit C-Mn (GLUCOSAMINE CHONDROITIN COMPLX) CAPS Take by mouth daily.        . Glucose Blood (ACCU-CHEK INSTANT GLUCOSE TEST VI) by In Vitro route 4 (four) times daily.        [name] levofloxacin (LEVAQUIN) 250 MG tablet Take 1 tablet (250 mg total) by mouth daily.  10 tablet  0  . levothyroxine (SYNTHROID, LEVOTHROID) 125 MCG tablet Take 1 tablet (125 mcg total) by mouth daily before breakfast.  90 tablet  3  . metFORMIN (GLUCOPHAGE-XR) 500 MG 24 hr tablet 1 tab by mouth three times per day  270 tablet  3  . Multiple Vitamin (MULTIVITAMIN) capsule Take 1 capsule by mouth daily.        . Omega-3 Fatty Acids (FISH OIL) 1000 MG CAPS Take by mouth 2 (two) times daily.        [name] omeprazole (PRILOSEC) 20 MG capsule Take 1 capsule (20 mg total) by mouth 2 (two) times daily.  180 capsule  3  . pravastatin (PRAVACHOL) 10 MG tablet Take 1 tablet (10 mg total) by mouth daily.  90 tablet  3  .  VITAMIN D, CHOLECALCIFEROL, PO Take by mouth daily.         No current facility-administered medications on file prior to visit.    Allergies  Allergen Reactions  . Ace Inhibitors     REACTION: cough  . Atenolol     REACTION: bradycardia  . Codeine Other (See Comments)    Head spins "wild"  . Lovastatin     REACTION: myalygros  . Morphine Nausea And Vomiting  . Sulfa Antibiotics Other (See Comments)    As child almost died  . Tramadol     Ants crawling all over    Family History  Problem Relation Age of Onset  . Hypertension Mother   . Arthritis Mother   . Goiter Mother   . Heart attack Father 61  . Cancer Sister     breast  . Hypothyroidism Sister   . Heart attack Brother 40  . Diabetes Brother   . Cancer Brother     colon  . Colon cancer Brother 60   BP 122/70  Pulse 59  Temp(Src) 97.9 F (36.6 C) (Oral)  Ht 6' (1.829 m)  Wt 242 lb (109.77 kg)  BMI 32.81 kg/m2  SpO2 95%  Review of Systems Denies fever.    Objective:   Physical Exam VITAL SIGNS:  See vs page GENERAL: no distress Left neck: 1 cm skin nodule.    procedure: incision/drainage consent signed/pt agrees.  The proper site is identified and verified with the patient.   local 2% xylocaine with epi prep is alcohol pad The tip of an 18g needle is used to i and d the mass.  sebaceous material is expressed no complications neosporin and bandaid are applied.      Assessment & Plan:  Sebaceous cyst, new, s/p successful i and d.   Patient is advised the following: Patient Instructions  Please call if the bleeding starts up again. Please keep it covered with antibiotic ointment, and a bandaid, until it heals up.

## 2013-08-12 NOTE — Patient Instructions (Signed)
Please call if the bleeding starts up again. Please keep it covered with antibiotic ointment, and a bandaid, until it heals up.

## 2013-08-22 DIAGNOSIS — C73 Malignant neoplasm of thyroid gland: Secondary | ICD-10-CM | POA: Diagnosis not present

## 2013-08-22 DIAGNOSIS — Z85850 Personal history of malignant neoplasm of thyroid: Secondary | ICD-10-CM | POA: Diagnosis not present

## 2013-09-14 ENCOUNTER — Encounter: Payer: Self-pay | Admitting: Internal Medicine

## 2013-09-14 ENCOUNTER — Ambulatory Visit (INDEPENDENT_AMBULATORY_CARE_PROVIDER_SITE_OTHER): Payer: Medicare Other | Admitting: Internal Medicine

## 2013-09-14 VITALS — BP 120/70 | HR 68 | Temp 98.2°F | Wt 239.0 lb

## 2013-09-14 DIAGNOSIS — I10 Essential (primary) hypertension: Secondary | ICD-10-CM

## 2013-09-14 DIAGNOSIS — E785 Hyperlipidemia, unspecified: Secondary | ICD-10-CM

## 2013-09-14 DIAGNOSIS — IMO0002 Reserved for concepts with insufficient information to code with codable children: Secondary | ICD-10-CM | POA: Diagnosis not present

## 2013-09-14 DIAGNOSIS — G4710 Hypersomnia, unspecified: Secondary | ICD-10-CM | POA: Diagnosis not present

## 2013-09-14 DIAGNOSIS — M1710 Unilateral primary osteoarthritis, unspecified knee: Secondary | ICD-10-CM

## 2013-09-14 DIAGNOSIS — E119 Type 2 diabetes mellitus without complications: Secondary | ICD-10-CM

## 2013-09-14 MED ORDER — DICLOFENAC SODIUM 1 % TD GEL: 4.0000 g | Freq: Four times a day (QID) | TRANSDERMAL | Status: DC

## 2013-09-14 MED ORDER — METFORMIN HCL ER 500 MG PO TB24: ORAL_TABLET | ORAL | Status: DC

## 2013-09-14 MED ORDER — DULOXETINE HCL 30 MG PO CPEP: 30.0000 mg | ORAL_CAPSULE | Freq: Every day | ORAL | Status: DC

## 2013-09-14 MED ORDER — LEVOTHYROXINE SODIUM 125 MCG PO TABS: 125.0000 ug | ORAL_TABLET | Freq: Every day | ORAL | Status: DC

## 2013-09-14 NOTE — Patient Instructions (Addendum)
Please take all new medication as prescribed - if not too expensive, for voltaren gel, and the cymbalta at 30 mg per day  We can increase to 60 mg per day if you would like after 4 wks  Please continue all other medications as before, and refills have been done if requested.  Please have the pharmacy call with any other refills you may need.  Please continue your efforts at being more active, low cholesterol diet, and weight control.  You are otherwise up to date with prevention measures today.  Please keep your appointments with your specialists as you may have planned  You will be contacted regarding the referral for: overnight oximetry, and if abnormal, you will most likely want to see a pulmonary for ? Of sleep apnea  You are given the prescription for zostavax to use if not too expensive as well  Your handicap parking pass was signed today  Please return in 6 months, or sooner if needed

## 2013-09-14 NOTE — Assessment & Plan Note (Signed)
stable overall by history and exam, recent data reviewed with pt, and pt to continue medical treatment as before,  to f/u any worsening symptoms or concerns Lab Results  Component Value Date   LDLCALC 119* 03/22/2013

## 2013-09-14 NOTE — Progress Notes (Signed)
Subjective:    Patient ID: Nathan Russo, male    DOB: 19-Dec-1940, 73 y.o.   MRN: [id number]  HPI  Here to f/u; overall doing ok,  Pt denies chest pain, increased sob or doe, wheezing, orthopnea, PND, increased LE swelling, palpitations, dizziness or syncope.  Pt denies polydipsia, polyuria, or low sugar symptoms such as weakness or confusion improved with po intake.  Pt denies new neurological symptoms such as new headache, or facial or extremity weakness or numbness.   Pt states overall good compliance with meds, has been trying to follow lower cholesterol, diabetic diet, with wt overall up several lbs,  but little exercise however.  C/o fatigue, suggested per card to be ? Related to meds, tried to stop the flecainide but back in afib within 3 days. Has gained wt from 232 to 239 since feb 2015.  Snores at night, feels ok to first get up, but quite tired the rest of the day, can fall asleep anytime most days. Walking with cane for balanance now all the time, still with left knee pain, needs surg per Dr Theda Sers but pt has deferred for now.    Retired now for the 5th time, completely away from [employer] he been working with.   Denies worsening depressive symptoms, suicidal ideation, or panic  See dr Loanne Drilling for DM, Dr Gaynelle Arabian for prostate Past Medical History  Diagnosis Date  . THYROID NODULE 02/07/2010  . DIABETES MELLITUS, TYPE II 04/28/2007  . HYPERLIPIDEMIA 04/28/2007  . OBESITY 10/24/2006  . CARPAL TUNNEL SYNDROME, BILATERAL 02/07/2009  . OTITIS MEDIA, ACUTE, LEFT 12/26/2009  . HYPERTENSION 10/21/2006  . BRADYCARDIA, CHRONIC 10/24/2006  . GERD 02/11/2009  . PHIMOSIS 02/07/2009  . DEGENERATIVE JOINT DISEASE, RIGHT KNEE 10/24/2006  . Dizziness and giddiness 12/19/2009  . FATIGUE 04/28/2007  . Headache(784.0) 12/19/2009  . NECK MASS 02/07/2010  . CHEST PAIN-UNSPECIFIED 09/05/2008  . COLONIC POLYPS, HX OF 04/28/2007  . Abdominal pain, epigastric 04/11/2010  . CHOLELITHIASIS 04/22/2010  . BELCHING  04/23/2010  . Thyroid cancer 06/13/2010  . Cholelithiasis 06/13/2010  . Elevated PSA 06/13/2010  . S/P laparoscopic cholecystectomy 10/31/2010  . Depression 02/23/2011   Past Surgical History  Procedure Laterality Date  . Left knee surgery    . Right wrist surgury    . Cholecystectomy    . Thyroid surgery      reports that he has quit smoking. He has never used smokeless tobacco. He reports that he does not drink alcohol or use illicit drugs. family history includes Arthritis in his mother; Cancer in his brother and sister; Colon cancer (age of onset: 90) in his brother; Diabetes in his brother; Goiter in his mother; Heart attack (age of onset: 23) in his brother; Heart attack (age of onset: 68) in his father; Hypertension in his mother; Hypothyroidism in his sister. Allergies  Allergen Reactions  . Ace Inhibitors     REACTION: cough  . Atenolol     REACTION: bradycardia  . Codeine Other (See Comments)    Head spins "wild"  . Lovastatin     REACTION: myalygros  . Morphine Nausea And Vomiting  . Sulfa Antibiotics Other (See Comments)    As child almost died  . Tramadol     Ants crawling all over   Current Outpatient Prescriptions on File Prior to Visit  Medication Sig Dispense Refill  . albuterol (PROVENTIL HFA;VENTOLIN HFA) 108 (90 BASE) MCG/ACT inhaler Inhale 2 puffs into the lungs every 6 (six) hours as needed  for wheezing or shortness of breath.  1 Inhaler  1  . apixaban (ELIQUIS) 5 MG TABS tablet Take by mouth 2 (two) times daily.      . Ascorbic Acid (VITAMIN C) 1000 MG tablet Take 1,000 mg by mouth daily.        [name] aspirin 81 MG EC tablet Take 81 mg by mouth daily.        . benazepril (LOTENSIN) 40 MG tablet Take 1 tablet (40 mg total) by mouth daily.  90 tablet  3  . Blood Glucose Monitoring Suppl (ACCU-CHEK COMPACT CARE KIT) KIT by Does not apply route.        . calcium carbonate (OS-CAL) 600 MG TABS Take 600 mg by mouth daily.        . Cinnamon 500 MG capsule Take 500 mg by  mouth 2 (two) times daily.        [name] diltiazem (CARDIZEM CD) 120 MG 24 hr capsule Take 1 capsule (120 mg total) by mouth 2 (two) times daily.  180 capsule  3  . Flaxseed, Linseed, 1000 MG CAPS Take 1 capsule by mouth daily.      . flecainide (TAMBOCOR) 100 MG tablet Take 100 mg by mouth 2 (two) times daily.      . Garlic Oil (ODORLESS GARLIC) 122 MG TABS Take by mouth 2 (two) times daily.        . Ginger, Zingiber officinalis, (GINGER PO) Take by mouth 4 (four) times daily.        . Glucosamine-Chondroit-Vit C-Mn (GLUCOSAMINE CHONDROITIN COMPLX) CAPS Take by mouth daily.        . Glucose Blood (ACCU-CHEK INSTANT GLUCOSE TEST VI) by In Vitro route 4 (four) times daily.        . Multiple Vitamin (MULTIVITAMIN) capsule Take 1 capsule by mouth daily.        . Omega-3 Fatty Acids (FISH OIL) 1000 MG CAPS Take by mouth 2 (two) times daily.        [name] omeprazole (PRILOSEC) 20 MG capsule Take 1 capsule (20 mg total) by mouth 2 (two) times daily.  180 capsule  3  . VITAMIN D, CHOLECALCIFEROL, PO Take by mouth daily.         No current facility-administered medications on file prior to visit.    Review of Systems  Constitutional: Negative for unusual diaphoresis or other sweats  HENT: Negative for ringing in ear Eyes: Negative for double vision or worsening visual disturbance.  Respiratory: Negative for choking and stridor.   Gastrointestinal: Negative for vomiting or other signifcant bowel change Genitourinary: Negative for hematuria or decreased urine volume.  Musculoskeletal: Negative for other MSK pain or swelling Skin: Negative for color change and worsening wound.  Neurological: Negative for tremors and numbness other than noted  Psychiatric/Behavioral: Negative for decreased concentration or agitation other than above       Objective:   Physical Exam BP 120/70  Pulse 68  Temp(Src) 98.2 F (36.8 C) (Oral)  Wt 239 lb (108.41 kg)  SpO2 97% VS noted,  Constitutional: Pt appears  well-developed, well-nourished.  HENT: Head: NCAT.  Right Ear: External ear normal.  Left Ear: External ear normal.  Eyes: . Pupils are equal, round, and reactive to light. Conjunctivae and EOM are normal Neck: Normal range of motion. Neck supple.  Cardiovascular: Normal rate and regular rhythm.   Pulmonary/Chest: Effort normal and breath sounds normal.  Abd:  Soft, NT, ND, + BS Neurological: Pt is alert. Not confused ,  motor grossly intact Skin: Skin is warm. No rash Psychiatric: Pt behavior is normal. No agitation.     Assessment & Plan:

## 2013-09-14 NOTE — Assessment & Plan Note (Signed)
Ongoing Left knee pain, declines ortho f/u for now, for cymbalta 30, voltaren gel prn, consider dr Smith/sport med eval

## 2013-09-14 NOTE — Assessment & Plan Note (Addendum)
Possible osa by hx, but declines pulm referral for now, but will ask for overnight oximetry  Note:  Total time for pt hx, exam, review of record with pt in the room, determination of diagnoses and plan for further eval and tx is > 40 min, with over 50% spent in coordination and counseling of patient

## 2013-09-14 NOTE — Assessment & Plan Note (Signed)
stable overall by history and exam, recent data reviewed with pt, and pt to continue medical treatment as before,  to f/u any worsening symptoms or concerns Lab Results  Component Value Date   HGBA1C 6.6* 03/22/2013

## 2013-09-15 ENCOUNTER — Telehealth: Payer: Self-pay | Admitting: Internal Medicine

## 2013-09-15 NOTE — Telephone Encounter (Signed)
Relevant patient education assigned to patient using Emmi. ° °

## 2013-09-19 DIAGNOSIS — G4710 Hypersomnia, unspecified: Secondary | ICD-10-CM | POA: Diagnosis not present

## 2013-09-20 ENCOUNTER — Ambulatory Visit: Payer: Medicare Other | Admitting: Internal Medicine

## 2013-09-27 ENCOUNTER — Telehealth: Payer: Self-pay | Admitting: *Deleted

## 2013-09-27 DIAGNOSIS — G4710 Hypersomnia, unspecified: Secondary | ICD-10-CM

## 2013-09-27 DIAGNOSIS — G4734 Idiopathic sleep related nonobstructive alveolar hypoventilation: Secondary | ICD-10-CM

## 2013-09-27 NOTE — Telephone Encounter (Signed)
Left msg on triage stating he never heard back from the overnight oximetry test that was done last week. Requesting results...Nathan Russo

## 2013-09-27 NOTE — Telephone Encounter (Signed)
Nathan Russo does appear to have over 3 hrs out of 7 hrs low oxygen when trying to sleep   This is highly suggestive of possible sleep apnea  Can I refer to Pulmonary for further eval and tx?

## 2013-09-28 NOTE — Telephone Encounter (Signed)
Done per emr 

## 2013-09-28 NOTE — Telephone Encounter (Signed)
Notified pt with md response. Pt does want to see a pulmonologist.../lmb

## 2013-10-20 ENCOUNTER — Other Ambulatory Visit: Payer: Self-pay

## 2013-10-20 DIAGNOSIS — L298 Other pruritus: Secondary | ICD-10-CM | POA: Diagnosis not present

## 2013-10-20 DIAGNOSIS — L219 Seborrheic dermatitis, unspecified: Secondary | ICD-10-CM | POA: Diagnosis not present

## 2013-10-20 DIAGNOSIS — L408 Other psoriasis: Secondary | ICD-10-CM | POA: Diagnosis not present

## 2013-10-20 DIAGNOSIS — L570 Actinic keratosis: Secondary | ICD-10-CM | POA: Diagnosis not present

## 2013-10-20 DIAGNOSIS — Z85828 Personal history of other malignant neoplasm of skin: Secondary | ICD-10-CM | POA: Diagnosis not present

## 2013-10-20 DIAGNOSIS — I781 Nevus, non-neoplastic: Secondary | ICD-10-CM | POA: Diagnosis not present

## 2013-10-20 DIAGNOSIS — L821 Other seborrheic keratosis: Secondary | ICD-10-CM | POA: Diagnosis not present

## 2013-10-20 MED ORDER — OMEPRAZOLE 20 MG PO CPDR: 20.0000 mg | DELAYED_RELEASE_CAPSULE | Freq: Two times a day (BID) | ORAL | Status: DC

## 2013-10-20 MED ORDER — BENAZEPRIL HCL 40 MG PO TABS: 40.0000 mg | ORAL_TABLET | Freq: Every day | ORAL | Status: DC

## 2013-10-20 MED ORDER — LEVOTHYROXINE SODIUM 125 MCG PO TABS: 125.0000 ug | ORAL_TABLET | Freq: Every day | ORAL | Status: DC

## 2013-11-18 DIAGNOSIS — I4891 Unspecified atrial fibrillation: Secondary | ICD-10-CM | POA: Diagnosis not present

## 2013-11-18 DIAGNOSIS — I10 Essential (primary) hypertension: Secondary | ICD-10-CM | POA: Diagnosis not present

## 2013-11-18 DIAGNOSIS — E119 Type 2 diabetes mellitus without complications: Secondary | ICD-10-CM | POA: Diagnosis not present

## 2013-11-22 ENCOUNTER — Institutional Professional Consult (permissible substitution): Payer: Medicare Other | Admitting: Pulmonary Disease

## 2013-11-23 ENCOUNTER — Encounter: Payer: Self-pay | Admitting: Pulmonary Disease

## 2013-12-13 DIAGNOSIS — M65321 Trigger finger, right index finger: Secondary | ICD-10-CM | POA: Diagnosis not present

## 2013-12-27 DIAGNOSIS — Z23 Encounter for immunization: Secondary | ICD-10-CM | POA: Diagnosis not present

## 2014-01-04 ENCOUNTER — Encounter: Payer: Self-pay | Admitting: Endocrinology

## 2014-01-04 ENCOUNTER — Ambulatory Visit (INDEPENDENT_AMBULATORY_CARE_PROVIDER_SITE_OTHER): Payer: Medicare Other | Admitting: Endocrinology

## 2014-01-04 VITALS — BP 139/93 | HR 60 | Temp 97.7°F | Ht 72.0 in | Wt 240.0 lb

## 2014-01-04 DIAGNOSIS — E890 Postprocedural hypothyroidism: Secondary | ICD-10-CM

## 2014-01-04 DIAGNOSIS — E119 Type 2 diabetes mellitus without complications: Secondary | ICD-10-CM | POA: Diagnosis not present

## 2014-01-04 DIAGNOSIS — C73 Malignant neoplasm of thyroid gland: Secondary | ICD-10-CM

## 2014-01-04 LAB — BASIC METABOLIC PANEL
BUN: 20 mg/dL (ref 6–23)
CO2: 25 mEq/L (ref 19–32)
CREATININE: 1.4 mg/dL (ref 0.4–1.5)
Calcium: 9.5 mg/dL (ref 8.4–10.5)
Chloride: 104 mEq/L (ref 96–112)
GFR: 54.23 mL/min — AB (ref >60.00)
GLUCOSE: 189 mg/dL — AB (ref 70–99)
Potassium: 4.2 mEq/L (ref 3.5–5.1)
Sodium: 137 mEq/L (ref 135–145)

## 2014-01-04 LAB — TSH: TSH: 0.82 u[IU]/mL (ref 0.35–4.50)

## 2014-01-04 LAB — HEMOGLOBIN A1C: Hgb A1c MFr Bld: 7 % — ABNORMAL HIGH (ref 4.6–6.5)

## 2014-01-04 NOTE — Progress Notes (Signed)
Subjective:    Patient ID: Nathan Russo, male    DOB: [birth year], 73 y.o.   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 3/12: thyroidectomy with left neck dissection multifocal papillary adenocarcinoma, largest focus 2.9 cm, at the isthmus, with bilateral foci, 1/11 nodes pos in central compartment, 4/44 nodes pos at levels 2-5 (largest 2 cm), extrathyroidal extension, and lymphatic invasion: T3 N1b M0 4/12: body scan, after thyroid hormone withdrawal: uptake 3%; 2 foci at the thyroid bed only. 4/12: i-131 rx 153 [facility] 12/12: tg<0.2 (ab neg) 6/13: tg<0.2 (ab neg) 1/14: tg<0.2 (ab neg) 6/15: tg <0.1 (ab neg) He does not notice any lump in the neck.  At [facility], pt was noted to have a LN on the left anterior neck.   AF: in early 2015, it was again noted by cardiol at [facility] hospital.  he was started on flecanide.  He required repeat cardioversion.   It recurred transiently when the flecanide was reduced in early 2015.  Denies palpitations.  Postsurgical hypothyroidism: he denies excessive diaphoresis.  DM: per diabetic bundle.  Denies n/v on metformin.   Past Medical History  Diagnosis Date  . THYROID NODULE 02/07/2010  . DIABETES MELLITUS, TYPE II 04/28/2007  . HYPERLIPIDEMIA 04/28/2007  . OBESITY 10/24/2006  . CARPAL TUNNEL SYNDROME, BILATERAL 02/07/2009  . OTITIS MEDIA, ACUTE, LEFT 12/26/2009  . HYPERTENSION 10/21/2006  . BRADYCARDIA, CHRONIC 10/24/2006  . GERD 02/11/2009  . PHIMOSIS 02/07/2009  . DEGENERATIVE JOINT DISEASE, RIGHT KNEE 10/24/2006  . Dizziness and giddiness 12/19/2009  . FATIGUE 04/28/2007  . Headache(784.0) 12/19/2009  . NECK MASS 02/07/2010  . CHEST PAIN-UNSPECIFIED 09/05/2008  . COLONIC POLYPS, HX OF 04/28/2007  . Abdominal pain, epigastric 04/11/2010  . CHOLELITHIASIS 04/22/2010  . BELCHING 04/23/2010  .  Thyroid cancer 06/13/2010  . Cholelithiasis 06/13/2010  . Elevated PSA 06/13/2010  . S/P laparoscopic cholecystectomy 10/31/2010  . Depression 02/23/2011    Past Surgical History  Procedure Laterality Date  . Left knee surgery    . Right wrist surgury    . Cholecystectomy    . Thyroid surgery      History   Social History  . Marital Status: Married    Spouse Name: N/A    Number of Children: 3  . Years of Education: N/A   Occupational History  . former Event organiser     Social History Main Topics  . Smoking status: Former Research scientist (life sciences)  . Smokeless tobacco: Never Used  . Alcohol Use: No  . Drug Use: No  . Sexual Activity: Not on file   Other Topics Concern  . Not on file   Social History Narrative  . No narrative on file    Current Outpatient Prescriptions on File Prior to Visit  Medication Sig Dispense Refill  . albuterol (PROVENTIL HFA;VENTOLIN HFA) 108 (90 BASE) MCG/ACT inhaler Inhale 2 puffs into the lungs every 6 (six) hours as needed for wheezing or shortness of breath.  1 Inhaler  1  . apixaban (ELIQUIS) 5 MG TABS tablet Take by mouth 2 (two) times daily.      . Ascorbic Acid (VITAMIN C) 1000 MG tablet Take 1,000 mg by mouth daily.        [name] aspirin 81 MG EC tablet Take 81 mg by mouth daily.        [name]  benazepril (LOTENSIN) 40 MG tablet Take 1 tablet (40 mg total) by mouth daily.  90 tablet  3  . Blood Glucose Monitoring Suppl (ACCU-CHEK COMPACT CARE KIT) KIT by Does not apply route.        . calcium carbonate (OS-CAL) 600 MG TABS Take 600 mg by mouth daily.        . Cinnamon 500 MG capsule Take 500 mg by mouth 2 (two) times daily.        . diclofenac sodium (VOLTAREN) 1 % GEL Apply 4 g topically 4 (four) times daily.  100 g  11  . DULoxetine (CYMBALTA) 30 MG capsule Take 1 capsule (30 mg total) by mouth daily.  90 capsule  3  . Flaxseed, Linseed, 1000 MG CAPS Take 1 capsule by mouth daily.      . flecainide (TAMBOCOR) 100 MG tablet Take 100 mg by  mouth 2 (two) times daily.      . Garlic Oil (ODORLESS GARLIC) 761 MG TABS Take by mouth 2 (two) times daily.        . Ginger, Zingiber officinalis, (GINGER PO) Take by mouth 4 (four) times daily.        . Glucosamine-Chondroit-Vit C-Mn (GLUCOSAMINE CHONDROITIN COMPLX) CAPS Take by mouth daily.        . Glucose Blood (ACCU-CHEK INSTANT GLUCOSE TEST VI) by In Vitro route 4 (four) times daily.        [name] levothyroxine (SYNTHROID, LEVOTHROID) 125 MCG tablet Take 1 tablet (125 mcg total) by mouth daily before breakfast.  90 tablet  1  . metFORMIN (GLUCOPHAGE-XR) 500 MG 24 hr tablet 1 tab by mouth three times per day  270 tablet  3  . Multiple Vitamin (MULTIVITAMIN) capsule Take 1 capsule by mouth daily.        . Omega-3 Fatty Acids (FISH OIL) 1000 MG CAPS Take by mouth 2 (two) times daily.        [name] omeprazole (PRILOSEC) 20 MG capsule Take 1 capsule (20 mg total) by mouth 2 (two) times daily.  180 capsule  3  . VITAMIN D, CHOLECALCIFEROL, PO Take by mouth daily.        [name] diltiazem (CARDIZEM CD) 120 MG 24 hr capsule Take 1 capsule (120 mg total) by mouth 2 (two) times daily.  180 capsule  3   No current facility-administered medications on file prior to visit.    Allergies  Allergen Reactions  . Ace Inhibitors     REACTION: cough  . Atenolol     REACTION: bradycardia  . Codeine Other (See Comments)    Head spins "wild"  . Lovastatin     REACTION: myalygros  . Morphine Nausea And Vomiting  . Sulfa Antibiotics Other (See Comments)    As child almost died  . Tramadol     Ants crawling all over    Family History  Problem Relation Age of Onset  . Hypertension Mother   . Arthritis Mother   . Goiter Mother   . Heart attack Father 64  . Cancer Sister     breast  . Hypothyroidism Sister   . Heart attack Brother 21  . Diabetes Brother   . Cancer Brother     colon  . Colon cancer Brother 60    BP 139/93  Pulse 60  Temp(Src) 97.7 F (36.5 C) (Oral)  Ht 6' (1.829 m)  Wt 240 lb  (108.863 kg)  BMI 32.54 kg/m2  SpO2 97%  Review of Systems Denies weight  change and numbness.  Denies palpitations, blurry vision, headache, sob, n/v, urinary frequency, muscle cramps, depression, cold intolerance, and rhinorrhea.  He has easy bruising and dry skin.     Objective:   Physical Exam VITAL SIGNS:  See vs page GENERAL: no distress Neck: a healed scar is present.  i do not appreciate a nodule in the thyroid or elsewhere in the neck Pulses: dorsalis pedis intact bilat.   Feet: no deformity.  no edema. There is bilateral onychomycosis Skin:  no ulcer on the feet.  normal color and temp.  Neuro: sensation is intact to touch on the feet.    Radiol (from [facility]) ultrasound of the bilateral anterior cervical chain inclusive of zones 1-4 , thyroid bed, tracheoesophageal grooves and inferior central compartment was performed with the GE Logiq e ultrasound machine. The findings are as follows: One small 77m node.   i have reviewed the following old records: Office notes from [facility]  Lab Results  Component Value Date   HGBA1C 7.0* 01/04/2014   Lab Results  Component Value Date   TSH 0.82 01/04/2014      Assessment & Plan:  Thyroid cancer: no clinical evidence of recurrence Neck nodule, new, uncertain etiology.  Low-risk, so I agree with plan for f/u [place] Postsurgical hypothyroidism: in view of AF, plan is replacement to normal TSH.     Patient is advised the following: Patient Instructions  blood tests are being requested for you today.  We'll contact you with results.  Please come back for a follow-up appointment in 6 months.   In view of your atrial fibrillation, let's shoot for a normal thyroid level.    We can recheck the thyroid ultrasound next year.

## 2014-01-04 NOTE — Patient Instructions (Addendum)
blood tests are being requested for you today.  We'll contact you with results.  Please come back for a follow-up appointment in 6 months.   In view of your atrial fibrillation, let's shoot for a normal thyroid level.    We can recheck the thyroid ultrasound next year.

## 2014-02-20 DIAGNOSIS — C73 Malignant neoplasm of thyroid gland: Secondary | ICD-10-CM | POA: Diagnosis not present

## 2014-02-20 DIAGNOSIS — E039 Hypothyroidism, unspecified: Secondary | ICD-10-CM | POA: Diagnosis not present

## 2014-02-20 DIAGNOSIS — Z85850 Personal history of malignant neoplasm of thyroid: Secondary | ICD-10-CM | POA: Diagnosis not present

## 2014-03-07 DIAGNOSIS — M25511 Pain in right shoulder: Secondary | ICD-10-CM | POA: Diagnosis not present

## 2014-03-07 DIAGNOSIS — M75101 Unspecified rotator cuff tear or rupture of right shoulder, not specified as traumatic: Secondary | ICD-10-CM | POA: Diagnosis not present

## 2014-04-10 DIAGNOSIS — N401 Enlarged prostate with lower urinary tract symptoms: Secondary | ICD-10-CM | POA: Diagnosis not present

## 2014-04-11 ENCOUNTER — Other Ambulatory Visit: Payer: Self-pay | Admitting: Dermatology

## 2014-04-11 DIAGNOSIS — L409 Psoriasis, unspecified: Secondary | ICD-10-CM | POA: Diagnosis not present

## 2014-04-11 DIAGNOSIS — L603 Nail dystrophy: Secondary | ICD-10-CM | POA: Diagnosis not present

## 2014-04-11 DIAGNOSIS — B351 Tinea unguium: Secondary | ICD-10-CM | POA: Diagnosis not present

## 2014-04-13 DIAGNOSIS — I4891 Unspecified atrial fibrillation: Secondary | ICD-10-CM | POA: Diagnosis not present

## 2014-04-13 DIAGNOSIS — E119 Type 2 diabetes mellitus without complications: Secondary | ICD-10-CM | POA: Diagnosis not present

## 2014-04-13 DIAGNOSIS — I10 Essential (primary) hypertension: Secondary | ICD-10-CM | POA: Diagnosis not present

## 2014-04-19 ENCOUNTER — Other Ambulatory Visit (INDEPENDENT_AMBULATORY_CARE_PROVIDER_SITE_OTHER): Payer: Medicare Other

## 2014-04-19 ENCOUNTER — Encounter: Payer: Self-pay | Admitting: Internal Medicine

## 2014-04-19 ENCOUNTER — Ambulatory Visit (INDEPENDENT_AMBULATORY_CARE_PROVIDER_SITE_OTHER): Payer: Medicare Other | Admitting: Internal Medicine

## 2014-04-19 ENCOUNTER — Ambulatory Visit: Payer: Medicare Other | Admitting: Internal Medicine

## 2014-04-19 ENCOUNTER — Telehealth: Payer: Self-pay | Admitting: Internal Medicine

## 2014-04-19 VITALS — BP 118/82 | HR 61 | Temp 98.1°F | Resp 16 | Ht 72.0 in | Wt 235.2 lb

## 2014-04-19 DIAGNOSIS — I10 Essential (primary) hypertension: Secondary | ICD-10-CM | POA: Diagnosis not present

## 2014-04-19 DIAGNOSIS — J209 Acute bronchitis, unspecified: Secondary | ICD-10-CM | POA: Diagnosis not present

## 2014-04-19 DIAGNOSIS — R0781 Pleurodynia: Secondary | ICD-10-CM

## 2014-04-19 DIAGNOSIS — E162 Hypoglycemia, unspecified: Secondary | ICD-10-CM

## 2014-04-19 DIAGNOSIS — R3915 Urgency of urination: Secondary | ICD-10-CM | POA: Diagnosis not present

## 2014-04-19 DIAGNOSIS — N401 Enlarged prostate with lower urinary tract symptoms: Secondary | ICD-10-CM | POA: Diagnosis not present

## 2014-04-19 DIAGNOSIS — R972 Elevated prostate specific antigen [PSA]: Secondary | ICD-10-CM | POA: Diagnosis not present

## 2014-04-19 LAB — HEMOGLOBIN A1C: Hgb A1c MFr Bld: 7.2 % — ABNORMAL HIGH (ref 4.6–6.5)

## 2014-04-19 MED ORDER — BENZONATATE 200 MG PO CAPS: 200.0000 mg | ORAL_CAPSULE | Freq: Two times a day (BID) | ORAL | Status: DC | PRN

## 2014-04-19 MED ORDER — AMOXICILLIN 500 MG PO CAPS: 500.0000 mg | ORAL_CAPSULE | Freq: Three times a day (TID) | ORAL | Status: DC

## 2014-04-19 MED ORDER — LOSARTAN POTASSIUM 100 MG PO TABS: 100.0000 mg | ORAL_TABLET | Freq: Every day | ORAL | Status: DC

## 2014-04-19 NOTE — Telephone Encounter (Signed)
90 day supply sent to [facility] in [place]

## 2014-04-19 NOTE — Progress Notes (Signed)
Pre visit review using our clinic review tool, if applicable. No additional management support is needed unless otherwise documented below in the visit note. 

## 2014-04-19 NOTE — Progress Notes (Signed)
   Subjective:    Patient ID: Nathan Russo, male    DOB: 22-Jun-1940, 74 y.o.   MRN: [id number]  HPI   Symptoms began as a progressive dry cough 3 weeks ago. As of last 24-48 hours he is now producing yellow/green sputum. This is associated with some chest tightness. He also has some discomfort in the inferior thoracic areas bilaterally with cough.  Actually he describes a dry cough for months in the context of an ACE inhibitor. He previously been on lisinopril but was switched to benazepril. It is unclear why one ACE-I was substituted for another.  For the cough he has  used Coricidin HP, Robitussin, and generic Tussend. Apparently the last agent contained a decongestant as his blood pressure was 170/85 at the urologist appointment this morning.  He is a diabetic; he states his fasting blood sugars are 90-102. He rarely has hypoglycemia, most recently 2 months ago.  He denies any extrinsic symptoms, upper respiratory tract infection symptoms, or GERD symptoms.  PMH of ACE-I induced cough,GERD and CAP in 2012. DM ; last A1c 7% 01/04/14. Former smoker  Review of Systems Extrinsic symptoms of itchy, watery eyes, sneezing, or angioedema are denied. There is no dyspnea, wheezing or  paroxysmal nocturnal dyspnea. Frontal headache, facial pain , nasal purulence, dental pain, sore throat , otic pain or otic discharge denied. No fever , chills or sweats. Unexplained weight loss, abdominal pain, significant dyspepsia, dysphagia,or melena,  are denied.    Objective:   Physical Exam Positive or pertinent findings include: He is slightly unsteady getting on the table. He ambulates with a cane. Minimal wax present in otic canals. Gynecomastia is suggested.  General appearance:Adequately nourished but over weight as per CDC guidelines; no acute distress or increased work of breathing is present.  No  lymphadenopathy about the head, neck, or axilla noted.  Eyes: No conjunctival inflammation or lid  edema is present. There is no scleral icterus. Ears:  External ear exam shows no significant lesions or deformities.  Otoscopic examination reveals clear canals, tympanic membranes are intact bilaterally without bulging, retraction, inflammation or discharge. Nose:  External nasal examination shows no deformity or inflammation. Nasal mucosa are pink and moist without lesions or exudates. No septal dislocation or deviation.No obstruction to airflow.  Oral exam: Dental hygiene is good; lips and gums are healthy appearing.There is no oropharyngeal erythema or exudate noted.  Neck:  No deformities, thyromegaly, masses, or tenderness noted.   Supple with full range of motion without pain.  Heart:  Normal rate and regular rhythm. S1 and S2 normal without gallop, murmur, click, rub or other extra sounds.  Lungs:Chest clear to auscultation; no wheezes, rhonchi,rales ,or rubs present. Extremities:  No cyanosis, edema, or clubbing  noted  Skin: Warm & dry w/o jaundice or tenting.         Assessment & Plan:  #1 acute bronchitis with pleuritic chest pain  #2 chronic cough in the setting of ACE inhibitor therapy  #3 DM with low FBS & rare hypoglycemia  #4 elevated BP apparently due to decongestant use Plan: See orders and recommendations

## 2014-04-19 NOTE — Patient Instructions (Addendum)
The ACE inhibitor, benazepril can cause  your dry cough. This will be discontinued and we will Losartan as a substitute  Minimal Blood Pressure Goal= AVERAGE < 140/90;  Ideal is an AVERAGE < 135/85. This AVERAGE should be calculated from @ least 5-7 BP readings taken @ different times of day on different days of week. You should not respond to isolated BP readings , but rather the AVERAGE for that week .Please bring your  blood pressure cuff to office visits to verify that it is reliable.It  can also be checked against the blood pressure device at the pharmacy. Finger or wrist cuffs are not dependable; an arm cuff is.   Your next office appointment will be determined based upon review of your pending labs. Those instructions will be transmitted to you by mail  Critical values will be called. Followup as needed for any active or acute issue. Please report any significant change in your symptoms.  Please do not use Q-tips as we discussed. Should wax build up occur, please put 2-3 drops of mineral oil in the affected  ear at night to soften the wax .Cover the canal with a  cotton ball to prevent the oil from staining bed linens. In the morning fill the ear canal with hydrogen peroxide & lie in the opposite lateral decubitus position(on the side opposite the affected ear)  for 10-15 minutes. After allowing this period of time for the peroxide to dissolve the wax ;shower and use the thinnest washrag available to wick out the wax. If both ears are involved ; alternate this treatment from ear to ear each night until no wax is found on the washrag.

## 2014-04-19 NOTE — Telephone Encounter (Signed)
Pt called in and said that the losartan should have been written for a 90 day supply per his insurance.  It is cheaper

## 2014-05-23 DIAGNOSIS — I4891 Unspecified atrial fibrillation: Secondary | ICD-10-CM | POA: Diagnosis not present

## 2014-05-25 DIAGNOSIS — I4891 Unspecified atrial fibrillation: Secondary | ICD-10-CM | POA: Diagnosis not present

## 2014-05-31 DIAGNOSIS — I4891 Unspecified atrial fibrillation: Secondary | ICD-10-CM | POA: Diagnosis not present

## 2014-05-31 DIAGNOSIS — I479 Paroxysmal tachycardia, unspecified: Secondary | ICD-10-CM | POA: Diagnosis not present

## 2014-06-15 ENCOUNTER — Telehealth: Payer: Self-pay | Admitting: Internal Medicine

## 2014-06-15 NOTE — Telephone Encounter (Signed)
Would need ROV with me for this since not seen since July 2015   We would need to consider need for PT if he feels he may need a lift chair as well

## 2014-06-15 NOTE — Telephone Encounter (Signed)
Pt called in and a few question about a left chair  Best number  [phone]

## 2014-07-18 ENCOUNTER — Telehealth: Payer: Self-pay | Admitting: Internal Medicine

## 2014-07-18 DIAGNOSIS — I4891 Unspecified atrial fibrillation: Secondary | ICD-10-CM | POA: Diagnosis not present

## 2014-07-18 DIAGNOSIS — I10 Essential (primary) hypertension: Secondary | ICD-10-CM | POA: Diagnosis not present

## 2014-07-18 NOTE — Telephone Encounter (Signed)
Is requesting a script for a lift chair

## 2014-07-21 NOTE — Telephone Encounter (Signed)
Done hardcopy to steph 

## 2014-07-21 NOTE — Telephone Encounter (Signed)
Notified pt rx ready for pick-up.../lmb 

## 2014-08-02 ENCOUNTER — Encounter: Payer: Self-pay | Admitting: Internal Medicine

## 2014-08-02 ENCOUNTER — Ambulatory Visit (INDEPENDENT_AMBULATORY_CARE_PROVIDER_SITE_OTHER): Payer: Medicare Other | Admitting: Internal Medicine

## 2014-08-02 VITALS — BP 132/82 | HR 70 | Temp 97.8°F | Wt 240.0 lb

## 2014-08-02 DIAGNOSIS — H6092 Unspecified otitis externa, left ear: Secondary | ICD-10-CM | POA: Diagnosis not present

## 2014-08-02 DIAGNOSIS — H6090 Unspecified otitis externa, unspecified ear: Secondary | ICD-10-CM | POA: Insufficient documentation

## 2014-08-02 DIAGNOSIS — M179 Osteoarthritis of knee, unspecified: Secondary | ICD-10-CM | POA: Diagnosis not present

## 2014-08-02 DIAGNOSIS — I10 Essential (primary) hypertension: Secondary | ICD-10-CM | POA: Diagnosis not present

## 2014-08-02 DIAGNOSIS — IMO0002 Reserved for concepts with insufficient information to code with codable children: Secondary | ICD-10-CM

## 2014-08-02 DIAGNOSIS — M1710 Unilateral primary osteoarthritis, unspecified knee: Secondary | ICD-10-CM

## 2014-08-02 MED ORDER — NEOMYCIN-POLYMYXIN-HC 3.5-10000-1 OT SOLN: 4.0000 [drp] | Freq: Four times a day (QID) | OTIC | Status: DC

## 2014-08-02 MED ORDER — CIPROFLOXACIN HCL 250 MG PO TABS: 250.0000 mg | ORAL_TABLET | Freq: Two times a day (BID) | ORAL | Status: DC

## 2014-08-02 NOTE — Assessment & Plan Note (Signed)
With possible left mastoiditis as well - for cortisporin asd, also cipro course asd,  to f/u any worsening symptoms or concerns

## 2014-08-02 NOTE — Progress Notes (Signed)
Subjective:    Patient ID: Nathan Russo, male    DOB: [birth year], 74 y.o.   MRN: [id number]  HPI  Here with left pain, swelling and muflfed hearing somewhat, mild to mod, over 3 days, not better or worse withanything, assoc with pain just posterior to the left pinna as well but no swelling or tender  Pt denies chest pain, increased sob or doe, wheezing, orthopnea, PND, increased LE swelling, palpitations, dizziness or syncope.  Pt denies new neurological symptoms such as new headache, or facial or extremity weakness or numbness   Pt denies polydipsia, polyuria,  Has severe ongoing pain left knee, needs form filled out for lift chair mechanism, has about decided to go ahead with the left knee replacement but not yet., sees dr Theda Sers, thinks needs another steroid shot Past Medical History  Diagnosis Date  . THYROID NODULE 02/07/2010  . DIABETES MELLITUS, TYPE II 04/28/2007  . HYPERLIPIDEMIA 04/28/2007  . OBESITY 10/24/2006  . CARPAL TUNNEL SYNDROME, BILATERAL 02/07/2009  . OTITIS MEDIA, ACUTE, LEFT 12/26/2009  . HYPERTENSION 10/21/2006  . BRADYCARDIA, CHRONIC 10/24/2006  . GERD 02/11/2009  . PHIMOSIS 02/07/2009  . DEGENERATIVE JOINT DISEASE, RIGHT KNEE 10/24/2006  . Dizziness and giddiness 12/19/2009  . FATIGUE 04/28/2007  . Headache(784.0) 12/19/2009  . NECK MASS 02/07/2010  . CHEST PAIN-UNSPECIFIED 09/05/2008  . COLONIC POLYPS, HX OF 04/28/2007  . Abdominal pain, epigastric 04/11/2010  . CHOLELITHIASIS 04/22/2010  . BELCHING 04/23/2010  . Thyroid cancer 06/13/2010  . Cholelithiasis 06/13/2010  . Elevated PSA 06/13/2010  . S/P laparoscopic cholecystectomy 10/31/2010  . Depression 02/23/2011   Past Surgical History  Procedure Laterality Date  . Left knee surgery    . Right wrist surgury    . Cholecystectomy    . Thyroid surgery      reports that he has quit smoking. He has never used smokeless tobacco. He reports that he does not drink alcohol or use illicit drugs. family history includes Arthritis  in his mother; Cancer in his brother and sister; Colon cancer (age of onset: 42) in his brother; Diabetes in his brother; Goiter in his mother; Heart attack (age of onset: 54) in his brother; Heart attack (age of onset: 87) in his father; Hypertension in his mother; Hypothyroidism in his sister. Allergies  Allergen Reactions  . Ace Inhibitors     REACTION: cough  . Atenolol     REACTION: bradycardia  . Codeine Other (See Comments)    Head spins "wild"  . Lovastatin     REACTION: myalygros  . Morphine Nausea And Vomiting  . Sulfa Antibiotics Other (See Comments)    As child almost died  . Tramadol     Ants crawling all over   Current Outpatient Prescriptions on File Prior to Visit  Medication Sig Dispense Refill  . apixaban (ELIQUIS) 5 MG TABS tablet Take by mouth 2 (two) times daily.    . Ascorbic Acid (VITAMIN C) 1000 MG tablet Take 1,000 mg by mouth daily.      . Blood Glucose Monitoring Suppl (ACCU-CHEK COMPACT CARE KIT) KIT by Does not apply route.      . calcium carbonate (OS-CAL) 600 MG TABS Take 600 mg by mouth daily.      . Cinnamon 500 MG capsule Take 500 mg by mouth 2 (two) times daily.      . Flaxseed, Linseed, 1000 MG CAPS Take 1 capsule by mouth daily.    . flecainide (TAMBOCOR) 100 MG tablet Take 100 mg  by mouth 2 (two) times daily.    . Garlic Oil (ODORLESS GARLIC) 161 MG TABS Take by mouth 2 (two) times daily.      . Ginger, Zingiber officinalis, (GINGER PO) Take by mouth 4 (four) times daily.      . Glucosamine-Chondroit-Vit C-Mn (GLUCOSAMINE CHONDROITIN COMPLX) CAPS Take by mouth daily.      . Glucose Blood (ACCU-CHEK INSTANT GLUCOSE TEST VI) by In Vitro route 4 (four) times daily.      [name] levothyroxine (SYNTHROID, LEVOTHROID) 125 MCG tablet Take 1 tablet (125 mcg total) by mouth daily before breakfast. 90 tablet 1  . losartan (COZAAR) 100 MG tablet Take 1 tablet (100 mg total) by mouth daily. 90 tablet 1  . metFORMIN (GLUCOPHAGE-XR) 500 MG 24 hr tablet 1 tab by  mouth three times per day 270 tablet 3  . Multiple Vitamin (MULTIVITAMIN) capsule Take 1 capsule by mouth daily.      . Omega-3 Fatty Acids (FISH OIL) 1000 MG CAPS Take by mouth 2 (two) times daily.      [name] omeprazole (PRILOSEC) 20 MG capsule Take 1 capsule (20 mg total) by mouth 2 (two) times daily. 180 capsule 3  . VITAMIN D, CHOLECALCIFEROL, PO Take by mouth daily.      [name] amoxicillin (AMOXIL) 500 MG capsule Take 1 capsule (500 mg total) by mouth 3 (three) times daily. (Patient not taking: Reported on 08/02/2014) 30 capsule 0  . benzonatate (TESSALON) 200 MG capsule Take 1 capsule (200 mg total) by mouth 2 (two) times daily as needed for cough. (Patient not taking: Reported on 08/02/2014) 20 capsule 0  . diltiazem (CARDIZEM CD) 120 MG 24 hr capsule Take 1 capsule (120 mg total) by mouth 2 (two) times daily. 180 capsule 3   No current facility-administered medications on file prior to visit.   Review of Systems  Constitutional: Negative for unusual diaphoresis or night sweats HENT: Negative for ringing in ear or discharge Eyes: Negative for double vision or worsening visual disturbance.  Respiratory: Negative for choking and stridor.   Gastrointestinal: Negative for vomiting or other signifcant bowel change Genitourinary: Negative for hematuria or change in urine volume.  Musculoskeletal: Negative for other MSK pain or swelling Skin: Negative for color change and worsening wound.  Neurological: Negative for tremors and numbness other than noted  Psychiatric/Behavioral: Negative for decreased concentration or agitation other than above       Objective:   Physical Exam BP 132/82 mmHg  Pulse 70  Temp(Src) 97.8 F (36.6 C) (Oral)  Wt 240 lb (108.863 kg)  SpO2 98% VS noted, mild ill Constitutional: Pt appears in no significant distress HENT: Head: NCAT.  Right Ear: External ear normal.  Left Ear: External ear normal. Left canal with at least 50% obstruction with erythema, tender, red,  swelling with small colored d/c, also mild tender left mastoid area Eyes: . Pupils are equal, round, and reactive to light. Conjunctivae and EOM are normal Neck: Normal range of motion. Neck supple.  Cardiovascular: Normal rate and regular rhythm.   Pulmonary/Chest: Effort normal and breath sounds without rales or wheezing.  Neurological: Pt is alert. Not confused , motor grossly intact Skin: Skin is warm. No rash, no LE edema Psychiatric: Pt behavior is normal. No agitation.     Assessment & Plan:

## 2014-08-02 NOTE — Assessment & Plan Note (Signed)
stable overall by history and exam, recent data reviewed with pt, and pt to continue medical treatment as before,  to f/u any worsening symptoms or concerns BP Readings from Last 3 Encounters:  08/02/14 132/82  04/19/14 118/82  01/04/14 139/93

## 2014-08-02 NOTE — Progress Notes (Signed)
Pre visit review using our clinic review tool, if applicable. No additional management support is needed unless otherwise documented below in the visit note. 

## 2014-08-02 NOTE — Patient Instructions (Addendum)
Please take all new medication as prescribed - the ear drops and the pill antibiotics (sent to the pharmacy)  Please continue all other medications as before, including the losartan and the amlodipine  Please have the pharmacy call with any other refills you may need.  Please continue your efforts at being more active, low cholesterol diet, and weight control..  Please keep your appointments with your specialists as you may have planned  Your form for the Lift chair mechanism is filled out today

## 2014-08-02 NOTE — Assessment & Plan Note (Signed)
Ok for lift chair mechanism form filled out, to f/u with ortho, likely needs left knee TKR, cont pain control, walks with cane

## 2014-08-08 ENCOUNTER — Encounter: Payer: Self-pay | Admitting: Internal Medicine

## 2014-08-08 ENCOUNTER — Ambulatory Visit (INDEPENDENT_AMBULATORY_CARE_PROVIDER_SITE_OTHER): Payer: Medicare Other | Admitting: Internal Medicine

## 2014-08-08 VITALS — BP 138/76 | HR 71 | Wt 245.0 lb

## 2014-08-08 DIAGNOSIS — H6122 Impacted cerumen, left ear: Secondary | ICD-10-CM

## 2014-08-08 DIAGNOSIS — H6092 Unspecified otitis externa, left ear: Secondary | ICD-10-CM | POA: Diagnosis not present

## 2014-08-08 MED ORDER — ACETIC ACID 2 % OT SOLN: 4.0000 [drp] | Freq: Three times a day (TID) | OTIC | Status: DC

## 2014-08-08 NOTE — Progress Notes (Signed)
Subjective:  Patient ID: Nathan Russo, male    DOB: Jul 25, 1940  Age: 74 y.o. MRN: [id number]  CC: Ear Fullness   HPI Nathan Russo presents for L ear fullness x 1-2 wks, not better w/abx po.  Pt did no use drops due to cost  Outpatient Prescriptions Prior to Visit  Medication Sig Dispense Refill  . apixaban (ELIQUIS) 5 MG TABS tablet Take by mouth 2 (two) times daily.    . Ascorbic Acid (VITAMIN C) 1000 MG tablet Take 1,000 mg by mouth daily.      . benzonatate (TESSALON) 200 MG capsule Take 1 capsule (200 mg total) by mouth 2 (two) times daily as needed for cough. 20 capsule 0  . Blood Glucose Monitoring Suppl (ACCU-CHEK COMPACT CARE KIT) KIT by Does not apply route.      . calcium carbonate (OS-CAL) 600 MG TABS Take 600 mg by mouth daily.      . Cinnamon 500 MG capsule Take 500 mg by mouth 2 (two) times daily.      . ciprofloxacin (CIPRO) 250 MG tablet Take 1 tablet (250 mg total) by mouth 2 (two) times daily. 20 tablet 0  . Flaxseed, Linseed, 1000 MG CAPS Take 1 capsule by mouth daily.    . flecainide (TAMBOCOR) 100 MG tablet Take 100 mg by mouth 2 (two) times daily.    . Garlic Oil (ODORLESS GARLIC) 932 MG TABS Take by mouth 2 (two) times daily.      . Ginger, Zingiber officinalis, (GINGER PO) Take by mouth 4 (four) times daily.      . Glucosamine-Chondroit-Vit C-Mn (GLUCOSAMINE CHONDROITIN COMPLX) CAPS Take by mouth daily.      . Glucose Blood (ACCU-CHEK INSTANT GLUCOSE TEST VI) by In Vitro route 4 (four) times daily.      [name] levothyroxine (SYNTHROID, LEVOTHROID) 125 MCG tablet Take 1 tablet (125 mcg total) by mouth daily before breakfast. 90 tablet 1  . losartan (COZAAR) 100 MG tablet Take 1 tablet (100 mg total) by mouth daily. 90 tablet 1  . metFORMIN (GLUCOPHAGE-XR) 500 MG 24 hr tablet 1 tab by mouth three times per day 270 tablet 3  . Multiple Vitamin (MULTIVITAMIN) capsule Take 1 capsule by mouth daily.      . Omega-3 Fatty Acids (FISH OIL) 1000 MG CAPS Take by mouth 2 (two)  times daily.      [name] omeprazole (PRILOSEC) 20 MG capsule Take 1 capsule (20 mg total) by mouth 2 (two) times daily. 180 capsule 3  . VITAMIN D, CHOLECALCIFEROL, PO Take by mouth daily.      [name] amoxicillin (AMOXIL) 500 MG capsule Take 1 capsule (500 mg total) by mouth 3 (three) times daily. 30 capsule 0  . diltiazem (CARDIZEM CD) 120 MG 24 hr capsule Take 1 capsule (120 mg total) by mouth 2 (two) times daily. 180 capsule 3  . neomycin-polymyxin-hydrocortisone (CORTISPORIN) otic solution Place 4 drops into the left ear 4 (four) times daily. (Patient not taking: Reported on 08/08/2014) 10 mL 0   No facility-administered medications prior to visit.    ROS Review of Systems  Constitutional: Negative for appetite change, fatigue and unexpected weight change.  HENT: Positive for ear discharge and hearing loss (L). Negative for congestion, nosebleeds, sneezing, sore throat and trouble swallowing.   Eyes: Negative for itching and visual disturbance.  Respiratory: Negative for cough.   Cardiovascular: Negative for chest pain, palpitations and leg swelling.  Gastrointestinal: Negative for nausea, diarrhea, blood in stool and  abdominal distention.  Genitourinary: Negative for frequency and hematuria.  Musculoskeletal: Negative for back pain, joint swelling, gait problem and neck pain.  Skin: Negative for rash.  Neurological: Negative for dizziness, tremors, speech difficulty and weakness.  Psychiatric/Behavioral: Negative for sleep disturbance, dysphoric mood and agitation. The patient is not nervous/anxious.     Objective:  BP 138/76 mmHg  Pulse 71  Wt 245 lb (111.131 kg)  SpO2 95%  BP Readings from Last 3 Encounters:  08/08/14 138/76  08/02/14 132/82  04/19/14 118/82    Wt Readings from Last 3 Encounters:  08/08/14 245 lb (111.131 kg)  08/02/14 240 lb (108.863 kg)  04/19/14 235 lb 4 oz (106.709 kg)    Physical Exam  Constitutional: He is oriented to person, place, and time. He appears  well-developed. No distress.  NAD  HENT:  Mouth/Throat: Oropharynx is clear and moist.  L ear w/wax/debris  Eyes: Conjunctivae are normal. Pupils are equal, round, and reactive to light.  Neck: Normal range of motion. No JVD present. No thyromegaly present.  Cardiovascular: Normal rate, regular rhythm, normal heart sounds and intact distal pulses.  Exam reveals no gallop and no friction rub.   No murmur heard. Pulmonary/Chest: Effort normal and breath sounds normal. No respiratory distress. He has no wheezes. He has no rales. He exhibits no tenderness.  Abdominal: Soft. Bowel sounds are normal. He exhibits no distension and no mass. There is no tenderness. There is no rebound and no guarding.  Musculoskeletal: Normal range of motion. He exhibits no edema or tenderness.  Lymphadenopathy:    He has no cervical adenopathy.  Neurological: He is alert and oriented to person, place, and time. He has normal reflexes. No cranial nerve deficit. He exhibits normal muscle tone. He displays a negative Romberg sign. Coordination abnormal. Gait normal.  Cane  Skin: Skin is warm and dry. No rash noted.  Psychiatric: He has a normal mood and affect. His behavior is normal. Judgment and thought content normal.    Lab Results  Component Value Date   WBC 9.3 04/06/2013   HGB 14.4 04/06/2013   HCT 44.0 04/06/2013   PLT 401.0* 04/06/2013   GLUCOSE 189* 01/04/2014   CHOL 184 03/22/2013   TRIG 155.0* 03/22/2013   HDL 33.90* 03/22/2013   LDLCALC 119* 03/22/2013   ALT 30 03/22/2013   AST 24 03/22/2013   NA 137 01/04/2014   K 4.2 01/04/2014   CL 104 01/04/2014   CREATININE 1.4 01/04/2014   BUN 20 01/04/2014   CO2 25 01/04/2014   TSH 0.82 01/04/2014   PSA 4.42* 06/13/2010   INR 1.0 08/22/2008   HGBA1C 7.2* 04/19/2014   MICROALBUR 0.6 09/03/2011    Dg Chest 2 View  04/06/2013   CLINICAL DATA:  Fever, productive cough  EXAM: CHEST  2 VIEW  COMPARISON:  DG RIBS UNILATERAL W/CHEST*L* dated  03/07/2010; DG CHEST 1V PORT dated 08/22/2008  FINDINGS: Low lung volumes. Cardiac silhouette stone the upper limits normal. Mild ill-defined area of increased density projects within the right lower lobe. The osseous structures unremarkable.  IMPRESSION: Atelectasis versus infiltrate right lower lobe.   Electronically Signed   By: Margaree Mackintosh M.D.   On: 04/06/2013 15:46    Assessment & Plan:   Sourish was seen today for ear fullness.  Diagnoses and all orders for this visit:  External otitis of left ear  Other orders -     acetic acid (VOSOL) 2 % otic solution; Place 4 drops into the left  ear 3 (three) times daily.  I have discontinued Mr. Millspaugh amoxicillin. I am also having him start on acetic acid. Additionally, I am having him maintain his ACCU-CHEK COMPACT CARE KIT, Glucose Blood (ACCU-CHEK INSTANT GLUCOSE TEST VI), calcium carbonate, Cinnamon, Fish Oil, GLUCOSAMINE CHONDROITIN COMPLX, multivitamin, Garlic Oil, (VITAMIN D, CHOLECALCIFEROL, PO), vitamin C, (Ginger, Zingiber officinalis, (GINGER PO)), Flaxseed (Linseed), apixaban, flecainide, diltiazem, metFORMIN, omeprazole, levothyroxine, benzonatate, losartan, neomycin-polymyxin-hydrocortisone, ciprofloxacin, amLODipine, Calcium, OSTEO BI-FLEX TRIPLE STRENGTH, Omega-3 Fatty Acids (OMEGA-3 FISH OIL PO), and (FLAXSEED, LINSEED, PO).  Meds ordered this encounter  Medications  . amLODipine (NORVASC) 5 MG tablet    Sig: Take 0.5 tablets by mouth daily.  . Calcium 500-125 MG-UNIT TABS    Sig: 1 tablet daily.  . Misc Natural Products (OSTEO BI-FLEX TRIPLE STRENGTH) TABS    Sig: Take 1 tablet by mouth.  . Omega-3 Fatty Acids (OMEGA-3 FISH OIL PO)    Sig: Take 1 g by mouth daily.  [name] FLAXSEED, LINSEED, PO    Sig: Take 1 tablet by mouth daily.  [name] acetic acid (VOSOL) 2 % otic solution    Sig: Place 4 drops into the left ear 3 (three) times daily.    Dispense:  15 mL    Refill:  0    Procedure Note :     Procedure :  Ear irrigation    Indication:  Cerumen impaction L   Risks, including pain, dizziness, eardrum perforation, bleeding, infection and others as well as benefits were explained to the patient in detail. Verbal consent was obtained and the patient agreed to proceed.    We used "The Elephant Ear Irrigation Device" filled with lukewarm water for irrigation. A large amount wax was recovered. Procedure has also required manual wax removal with an ear loop.   Tolerated well. Complications: None.   Postprocedure instructions :  Call if problems.    Follow-up: No Follow-up on file.  Walker Kehr, MD

## 2014-08-08 NOTE — Assessment & Plan Note (Addendum)
Pt did no use drops due to cost Will irrigate debris Acetic acid gtt

## 2014-08-08 NOTE — Progress Notes (Signed)
Pre visit review using our clinic review tool, if applicable. No additional management support is needed unless otherwise documented below in the visit note. 

## 2014-08-09 ENCOUNTER — Ambulatory Visit: Payer: Medicare Other | Admitting: Internal Medicine

## 2014-08-28 ENCOUNTER — Encounter: Payer: Self-pay | Admitting: Internal Medicine

## 2014-08-28 ENCOUNTER — Ambulatory Visit (INDEPENDENT_AMBULATORY_CARE_PROVIDER_SITE_OTHER): Payer: Medicare Other | Admitting: Internal Medicine

## 2014-08-28 VITALS — BP 158/82 | HR 66 | Temp 98.2°F | Resp 18 | Wt 244.0 lb

## 2014-08-28 DIAGNOSIS — L03115 Cellulitis of right lower limb: Secondary | ICD-10-CM | POA: Diagnosis not present

## 2014-08-28 MED ORDER — CEPHALEXIN 500 MG PO CAPS: 500.0000 mg | ORAL_CAPSULE | Freq: Two times a day (BID) | ORAL | Status: DC

## 2014-08-28 NOTE — Patient Instructions (Signed)
Use warm moist compresses 3- 4 times a day to the affected area. Please report warning signs as we discussed. Worrisome would be red streaks up the extremity, increased pain, fever, or pus production.

## 2014-08-28 NOTE — Progress Notes (Signed)
Pre visit review using our clinic review tool, if applicable. No additional management support is needed unless otherwise documented below in the visit note. 

## 2014-08-28 NOTE — Progress Notes (Signed)
   Subjective:    Patient ID: Nathan Russo, male    DOB: 10/23/1940, 74 y.o.   MRN: [id number]  HPI  4 days after weed eating he noted that the right foot was swollen with bruising in the foot circumferentially over the toes heels and sides. He used ice topically with improvement although there's been residual soreness and redness over the right lateral ankle area. He believes he may have thrown a rock that struck him in this area. He is concerned because of the soreness and the potential risk of ulcers in the context of his diabetes.  He has had some edema of the ankle.  He believes his tetanus shot is up-to-date  Review of Systems Chest pain, palpitations, tachycardia, exertional dyspnea, paroxysmal nocturnal dyspnea,or  claudication  are absent.   With discontinuation of the ACE inhibitor; his cough has resolved.       Objective:   Physical Exam  Pertinent or positive findings include: There is a 22 x 20 mm erythematous lesion with hyperpigmented punctate area inferiorly suggesting possible point of entry. He has trace-1/2+ edema. Posterior tibial pulses are decreased. Homans sign is negative.Broad based gait;uses cane.   General appearance :adequately nourished; in no distress.  Eyes: No conjunctival inflammation or scleral icterus is present.  Heart:  Normal rate and regular rhythm. S1 and S2 normal without gallop, murmur, click, rub or other extra sounds    Lungs:Chest clear to auscultation; no wheezes, rhonchi,rales ,or rubs present.No increased work of breathing.   Vascular : all pulses equal ; no bruits present.  Skin:Warm & dry.  Intact without suspicious lesions or rashes ; no tenting or jaundice   Lymphatic: No lymphadenopathy is noted about the head, neck, axilla  Neuro: Strength, tone  normal.         Assessment & Plan:  #1 cellulitis right lateral ankle area  Plan: See orders and recommendations

## 2014-08-30 DIAGNOSIS — Z85850 Personal history of malignant neoplasm of thyroid: Secondary | ICD-10-CM | POA: Diagnosis not present

## 2014-08-30 DIAGNOSIS — C73 Malignant neoplasm of thyroid gland: Secondary | ICD-10-CM | POA: Diagnosis not present

## 2014-08-30 DIAGNOSIS — Z483 Aftercare following surgery for neoplasm: Secondary | ICD-10-CM | POA: Diagnosis not present

## 2014-10-25 ENCOUNTER — Other Ambulatory Visit: Payer: Self-pay | Admitting: Internal Medicine

## 2014-11-02 DIAGNOSIS — L570 Actinic keratosis: Secondary | ICD-10-CM | POA: Diagnosis not present

## 2014-11-02 DIAGNOSIS — L309 Dermatitis, unspecified: Secondary | ICD-10-CM | POA: Diagnosis not present

## 2014-11-02 DIAGNOSIS — I87303 Chronic venous hypertension (idiopathic) without complications of bilateral lower extremity: Secondary | ICD-10-CM | POA: Diagnosis not present

## 2014-11-02 DIAGNOSIS — L817 Pigmented purpuric dermatosis: Secondary | ICD-10-CM | POA: Diagnosis not present

## 2014-11-10 DIAGNOSIS — I480 Paroxysmal atrial fibrillation: Secondary | ICD-10-CM | POA: Diagnosis not present

## 2014-11-10 DIAGNOSIS — E119 Type 2 diabetes mellitus without complications: Secondary | ICD-10-CM | POA: Diagnosis not present

## 2014-11-10 DIAGNOSIS — I10 Essential (primary) hypertension: Secondary | ICD-10-CM | POA: Diagnosis not present

## 2014-11-16 DIAGNOSIS — L570 Actinic keratosis: Secondary | ICD-10-CM | POA: Diagnosis not present

## 2014-11-16 DIAGNOSIS — Z4802 Encounter for removal of sutures: Secondary | ICD-10-CM | POA: Diagnosis not present

## 2014-11-16 DIAGNOSIS — Z23 Encounter for immunization: Secondary | ICD-10-CM | POA: Diagnosis not present

## 2014-11-22 DIAGNOSIS — Z85828 Personal history of other malignant neoplasm of skin: Secondary | ICD-10-CM | POA: Diagnosis not present

## 2014-11-22 DIAGNOSIS — L570 Actinic keratosis: Secondary | ICD-10-CM | POA: Diagnosis not present

## 2014-11-22 DIAGNOSIS — D692 Other nonthrombocytopenic purpura: Secondary | ICD-10-CM | POA: Diagnosis not present

## 2014-11-22 DIAGNOSIS — L821 Other seborrheic keratosis: Secondary | ICD-10-CM | POA: Diagnosis not present

## 2014-11-22 DIAGNOSIS — L409 Psoriasis, unspecified: Secondary | ICD-10-CM | POA: Diagnosis not present

## 2014-12-06 ENCOUNTER — Encounter: Payer: Self-pay | Admitting: Internal Medicine

## 2014-12-06 DIAGNOSIS — H2513 Age-related nuclear cataract, bilateral: Secondary | ICD-10-CM | POA: Diagnosis not present

## 2014-12-12 DIAGNOSIS — Z23 Encounter for immunization: Secondary | ICD-10-CM | POA: Diagnosis not present

## 2014-12-25 ENCOUNTER — Other Ambulatory Visit: Payer: Self-pay | Admitting: Internal Medicine

## 2015-01-01 ENCOUNTER — Other Ambulatory Visit: Payer: Self-pay | Admitting: Internal Medicine

## 2015-01-11 ENCOUNTER — Encounter: Payer: Self-pay | Admitting: Internal Medicine

## 2015-01-11 ENCOUNTER — Ambulatory Visit (INDEPENDENT_AMBULATORY_CARE_PROVIDER_SITE_OTHER)
Admission: RE | Admit: 2015-01-11 | Discharge: 2015-01-11 | Disposition: A | Discharge status: Home / Self Care | Payer: Medicare Other | Source: Ambulatory Visit | Attending: Internal Medicine | Admitting: Internal Medicine

## 2015-01-11 ENCOUNTER — Ambulatory Visit (INDEPENDENT_AMBULATORY_CARE_PROVIDER_SITE_OTHER): Payer: Medicare Other | Admitting: Internal Medicine

## 2015-01-11 VITALS — BP 144/76 | HR 62 | Temp 98.5°F | Ht 72.0 in | Wt 244.0 lb

## 2015-01-11 DIAGNOSIS — R0989 Other specified symptoms and signs involving the circulatory and respiratory systems: Secondary | ICD-10-CM | POA: Diagnosis not present

## 2015-01-11 DIAGNOSIS — R05 Cough: Secondary | ICD-10-CM

## 2015-01-11 DIAGNOSIS — R0689 Other abnormalities of breathing: Secondary | ICD-10-CM | POA: Insufficient documentation

## 2015-01-11 DIAGNOSIS — R0602 Shortness of breath: Secondary | ICD-10-CM | POA: Diagnosis not present

## 2015-01-11 DIAGNOSIS — I482 Chronic atrial fibrillation, unspecified: Secondary | ICD-10-CM

## 2015-01-11 DIAGNOSIS — R509 Fever, unspecified: Secondary | ICD-10-CM | POA: Diagnosis not present

## 2015-01-11 DIAGNOSIS — E119 Type 2 diabetes mellitus without complications: Secondary | ICD-10-CM | POA: Diagnosis not present

## 2015-01-11 DIAGNOSIS — R059 Cough, unspecified: Secondary | ICD-10-CM | POA: Insufficient documentation

## 2015-01-11 MED ORDER — FUROSEMIDE 40 MG PO TABS: ORAL_TABLET | ORAL | Status: DC

## 2015-01-11 MED ORDER — LEVOFLOXACIN 250 MG PO TABS: 250.0000 mg | ORAL_TABLET | Freq: Every day | ORAL | Status: DC

## 2015-01-11 MED ORDER — BENZONATATE 100 MG PO CAPS: ORAL_CAPSULE | ORAL | Status: DC

## 2015-01-11 NOTE — Progress Notes (Signed)
Pre visit review using our clinic review tool, if applicable. No additional management support is needed unless otherwise documented below in the visit note. 

## 2015-01-11 NOTE — Progress Notes (Signed)
Subjective:    Patient ID: Nathan Russo, male    DOB: [birth year], 74 y.o.   MRN: [id number]  HPI  74 yo M with hx of afib (no CHF per pt, followed by cardiology -Dr Fitzgerald/[facility]), here with - Here with acute onset mild to mod 2-3 days ST, HA, general weakness and malaise, with prod cough greenish sputum, but Pt denies chest pain, increased sob or doe, wheezing, increased LE swelling, palpitations, dizziness or syncope, but also some orthopnea starting last PM, just could not sleep well lying flat.  On eliquis, no overt bleeding. Pt denies new neurological symptoms such as new headache, or facial or extremity weakness or numbness   Pt denies polydipsia, polyuria, Past Medical History  Diagnosis Date  . THYROID NODULE 02/07/2010  . DIABETES MELLITUS, TYPE II 04/28/2007  . HYPERLIPIDEMIA 04/28/2007  . OBESITY 10/24/2006  . CARPAL TUNNEL SYNDROME, BILATERAL 02/07/2009  . OTITIS MEDIA, ACUTE, LEFT 12/26/2009  . HYPERTENSION 10/21/2006  . BRADYCARDIA, CHRONIC 10/24/2006  . GERD 02/11/2009  . PHIMOSIS 02/07/2009  . DEGENERATIVE JOINT DISEASE, RIGHT KNEE 10/24/2006  . Dizziness and giddiness 12/19/2009  . FATIGUE 04/28/2007  . Headache(784.0) 12/19/2009  . NECK MASS 02/07/2010  . CHEST PAIN-UNSPECIFIED 09/05/2008  . COLONIC POLYPS, HX OF 04/28/2007  . Abdominal pain, epigastric 04/11/2010  . CHOLELITHIASIS 04/22/2010  . BELCHING 04/23/2010  . Thyroid cancer ([place]) 06/13/2010  . Cholelithiasis 06/13/2010  . Elevated PSA 06/13/2010  . S/P laparoscopic cholecystectomy 10/31/2010  . Depression 02/23/2011   Past Surgical History  Procedure Laterality Date  . Left knee surgery    . Right wrist surgury    . Cholecystectomy    . Thyroid surgery      reports that he has quit smoking. He has never used smokeless tobacco. He reports that he does not drink alcohol or use illicit drugs. family history includes Arthritis in his mother; Cancer in his brother and sister; Colon cancer (age of onset: 87) in his  brother; Diabetes in his brother; Goiter in his mother; Heart attack (age of onset: 73) in his brother; Heart attack (age of onset: 41) in his father; Hypertension in his mother; Hypothyroidism in his sister. Allergies  Allergen Reactions  . Ace Inhibitors     REACTION: cough  . Atenolol     REACTION: bradycardia  . Codeine Other (See Comments)    Head spins "wild"  . Lovastatin     REACTION: myalygros  . Morphine Nausea And Vomiting  . Sulfa Antibiotics Other (See Comments)    As child almost died  . Tramadol     Ants crawling all over   Current Outpatient Prescriptions on File Prior to Visit  Medication Sig Dispense Refill  . amLODipine (NORVASC) 5 MG tablet Take 0.5 tablets by mouth daily.    [name] apixaban (ELIQUIS) 5 MG TABS tablet Take by mouth 2 (two) times daily.    . Ascorbic Acid (VITAMIN C) 1000 MG tablet Take 1,000 mg by mouth daily.      . Blood Glucose Monitoring Suppl (ACCU-CHEK COMPACT CARE KIT) KIT by Does not apply route.      . Calcium 500-125 MG-UNIT TABS 1 tablet daily.    . calcium carbonate (OS-CAL) 600 MG TABS Take 600 mg by mouth daily.      . Cinnamon 500 MG capsule Take 500 mg by mouth 2 (two) times daily.      . Flaxseed, Linseed, 1000 MG CAPS Take 1 capsule by mouth daily.    [name]  flecainide (TAMBOCOR) 100 MG tablet Take 100 mg by mouth 2 (two) times daily.    . Garlic Oil (ODORLESS GARLIC) 657 MG TABS Take by mouth 2 (two) times daily.      . Ginger, Zingiber officinalis, (GINGER PO) Take by mouth 4 (four) times daily.      . Glucosamine-Chondroit-Vit C-Mn (GLUCOSAMINE CHONDROITIN COMPLX) CAPS Take by mouth daily.      . Glucose Blood (ACCU-CHEK INSTANT GLUCOSE TEST VI) by In Vitro route 4 (four) times daily.      [name] levothyroxine (SYNTHROID, LEVOTHROID) 125 MCG tablet TAKE ONE TABLET BY MOUTH ONCE DAILY BEFORE BREAKFAST 90 tablet 0  . losartan (COZAAR) 100 MG tablet TAKE ONE TABLET BY MOUTH ONCE DAILY 90 tablet 0  . metFORMIN (GLUCOPHAGE-XR) 500 MG 24 hr  tablet TAKE ONE TABLET BY MOUTH THREE TIMES DAILY 270 tablet 0  . Misc Natural Products (OSTEO BI-FLEX TRIPLE STRENGTH) TABS Take 1 tablet by mouth.    . Multiple Vitamin (MULTIVITAMIN) capsule Take 1 capsule by mouth daily.      . Omega-3 Fatty Acids (FISH OIL) 1000 MG CAPS Take by mouth 2 (two) times daily.      [name] omeprazole (PRILOSEC) 20 MG capsule TAKE ONE CAPSULE BY MOUTH TWICE DAILY 180 capsule 1  . VITAMIN D, CHOLECALCIFEROL, PO Take by mouth daily.      . cephALEXin (KEFLEX) 500 MG capsule Take 1 capsule (500 mg total) by mouth 2 (two) times daily. (Patient not taking: Reported on 01/11/2015) 14 capsule 0  . diltiazem (CARDIZEM CD) 120 MG 24 hr capsule Take 1 capsule (120 mg total) by mouth 2 (two) times daily. 180 capsule 3   No current facility-administered medications on file prior to visit.   Review of Systems  Constitutional: Negative for unusual diaphoresis or night sweats HENT: Negative for ringing in ear or discharge Eyes: Negative for double vision or worsening visual disturbance.  Respiratory: Negative for choking and stridor.   Gastrointestinal: Negative for vomiting or other signifcant bowel change Genitourinary: Negative for hematuria or change in urine volume.  Musculoskeletal: Negative for other MSK pain or swelling Skin: Negative for color change and worsening wound.  Neurological: Negative for tremors and numbness other than noted  Psychiatric/Behavioral: Negative for decreased concentration or agitation other than above       Objective:   Physical Exam BP 144/76 mmHg  Pulse 62  Temp(Src) 98.5 F (36.9 C) (Oral)  Ht 6' (1.829 m)  Wt 244 lb (110.678 kg)  BMI 33.09 kg/m2  SpO2 98% VS noted, mild ill Constitutional: Pt appears in no significant distress HENT: Head: NCAT.  Right Ear: External ear normal.  Left Ear: External ear normal.  Bilat tm's with mild erythema.  Max sinus areas non tender.  Pharynx with mild erythema, no exudate Eyes: . Pupils are  equal, round, and reactive to light. Conjunctivae and EOM are normal Bilat tm's with mild erythema.  Max sinus areas mild tender.  Pharynx with mild erythema, no exudate Neck: Normal range of motion. Neck supple.  Cardiovascular: Normal rate and irregular rhythm.   Pulmonary/Chest: Effort normal and breath sounds decresaed bilat with few bibas crackles left > right.  Neurological: Pt is alert. Not confused , motor grossly intact Skin: Skin is warm. No rash, no LE edema Psychiatric: Pt behavior is normal. No agitation.     Assessment & Plan:

## 2015-01-11 NOTE — Assessment & Plan Note (Signed)
Suspect bronchitis acute vs pna - for levaquin course, cough med prn

## 2015-01-11 NOTE — Assessment & Plan Note (Signed)
?   pulm edema vs pna vs other - for cxr, gave lasix rx for 40 qd x 3 days but to hold pending cxr results

## 2015-01-11 NOTE — Assessment & Plan Note (Signed)
Rate controlled on eliquis, stable,  to f/u any worsening symptoms or concerns

## 2015-01-11 NOTE — Patient Instructions (Signed)
Please take all new medication as prescribed  - the antibitoic, cough medicine  You are given the lasix prescription, but hold off on taking until we get results from the chest xray  Please go to the XRAY Department in the Basement (go straight as you get off the elevator) for the x-ray testing  You will be contacted by phone if any changes need to be made immediately.  Otherwise, you will receive a letter about your results with an explanation, but please check with MyChart first.  Please continue all other medications as before,  Please have the pharmacy call with any other refills you may need.  Please keep your appointments with your specialists as you may have planned

## 2015-01-13 NOTE — Assessment & Plan Note (Signed)
stable overall by history and exam, recent data reviewed with pt, and pt to continue medical treatment as before,  to f/u any worsening symptoms or concerns Lab Results  Component Value Date   HGBA1C 7.2* 04/19/2014

## 2015-02-06 ENCOUNTER — Other Ambulatory Visit: Payer: Self-pay | Admitting: Internal Medicine

## 2015-02-21 DIAGNOSIS — I4891 Unspecified atrial fibrillation: Secondary | ICD-10-CM | POA: Diagnosis not present

## 2015-02-22 DIAGNOSIS — M170 Bilateral primary osteoarthritis of knee: Secondary | ICD-10-CM | POA: Diagnosis not present

## 2015-02-22 DIAGNOSIS — M25562 Pain in left knee: Secondary | ICD-10-CM | POA: Diagnosis not present

## 2015-02-22 DIAGNOSIS — M25561 Pain in right knee: Secondary | ICD-10-CM | POA: Diagnosis not present

## 2015-02-22 DIAGNOSIS — R2689 Other abnormalities of gait and mobility: Secondary | ICD-10-CM | POA: Diagnosis not present

## 2015-02-22 DIAGNOSIS — M1712 Unilateral primary osteoarthritis, left knee: Secondary | ICD-10-CM | POA: Diagnosis not present

## 2015-02-23 DIAGNOSIS — I480 Paroxysmal atrial fibrillation: Secondary | ICD-10-CM | POA: Diagnosis not present

## 2015-02-23 DIAGNOSIS — E119 Type 2 diabetes mellitus without complications: Secondary | ICD-10-CM | POA: Diagnosis not present

## 2015-02-23 DIAGNOSIS — I10 Essential (primary) hypertension: Secondary | ICD-10-CM | POA: Diagnosis not present

## 2015-02-26 DIAGNOSIS — Z85850 Personal history of malignant neoplasm of thyroid: Secondary | ICD-10-CM | POA: Diagnosis not present

## 2015-02-26 DIAGNOSIS — Z08 Encounter for follow-up examination after completed treatment for malignant neoplasm: Secondary | ICD-10-CM | POA: Diagnosis not present

## 2015-02-26 DIAGNOSIS — H60543 Acute eczematoid otitis externa, bilateral: Secondary | ICD-10-CM | POA: Diagnosis not present

## 2015-02-27 DIAGNOSIS — M1712 Unilateral primary osteoarthritis, left knee: Secondary | ICD-10-CM | POA: Diagnosis not present

## 2015-02-27 DIAGNOSIS — R2689 Other abnormalities of gait and mobility: Secondary | ICD-10-CM | POA: Diagnosis not present

## 2015-02-27 DIAGNOSIS — M170 Bilateral primary osteoarthritis of knee: Secondary | ICD-10-CM | POA: Diagnosis not present

## 2015-02-27 DIAGNOSIS — M25562 Pain in left knee: Secondary | ICD-10-CM | POA: Diagnosis not present

## 2015-02-27 DIAGNOSIS — M25561 Pain in right knee: Secondary | ICD-10-CM | POA: Diagnosis not present

## 2015-03-05 DIAGNOSIS — M170 Bilateral primary osteoarthritis of knee: Secondary | ICD-10-CM | POA: Diagnosis not present

## 2015-03-05 DIAGNOSIS — M25561 Pain in right knee: Secondary | ICD-10-CM | POA: Diagnosis not present

## 2015-03-05 DIAGNOSIS — M1711 Unilateral primary osteoarthritis, right knee: Secondary | ICD-10-CM | POA: Diagnosis not present

## 2015-03-06 DIAGNOSIS — M1712 Unilateral primary osteoarthritis, left knee: Secondary | ICD-10-CM | POA: Diagnosis not present

## 2015-03-06 DIAGNOSIS — M25562 Pain in left knee: Secondary | ICD-10-CM | POA: Diagnosis not present

## 2015-03-12 DIAGNOSIS — R2689 Other abnormalities of gait and mobility: Secondary | ICD-10-CM | POA: Diagnosis not present

## 2015-03-12 DIAGNOSIS — M25562 Pain in left knee: Secondary | ICD-10-CM | POA: Diagnosis not present

## 2015-03-12 DIAGNOSIS — M170 Bilateral primary osteoarthritis of knee: Secondary | ICD-10-CM | POA: Diagnosis not present

## 2015-03-12 DIAGNOSIS — M1711 Unilateral primary osteoarthritis, right knee: Secondary | ICD-10-CM | POA: Diagnosis not present

## 2015-03-12 DIAGNOSIS — M25561 Pain in right knee: Secondary | ICD-10-CM | POA: Diagnosis not present

## 2015-03-13 ENCOUNTER — Telehealth: Payer: Self-pay | Admitting: *Deleted

## 2015-03-13 MED ORDER — AMLODIPINE BESYLATE 5 MG PO TABS: 2.5000 mg | ORAL_TABLET | Freq: Every day | ORAL | Status: DC

## 2015-03-13 MED ORDER — LEVOTHYROXINE SODIUM 125 MCG PO TABS: ORAL_TABLET | ORAL | Status: DC

## 2015-03-13 MED ORDER — METFORMIN HCL ER 500 MG PO TB24: 500.0000 mg | ORAL_TABLET | Freq: Three times a day (TID) | ORAL | Status: DC

## 2015-03-13 MED ORDER — LOSARTAN POTASSIUM 100 MG PO TABS: 100.0000 mg | ORAL_TABLET | Freq: Every day | ORAL | Status: DC

## 2015-03-13 NOTE — Telephone Encounter (Signed)
Received call pt states he has change insurance and needing 4 scripts sent to new mail service. Pt is now using [employer] inform pt will send all 4 to [facility]...Johny Chess

## 2015-03-14 DIAGNOSIS — M1712 Unilateral primary osteoarthritis, left knee: Secondary | ICD-10-CM | POA: Diagnosis not present

## 2015-03-14 DIAGNOSIS — M170 Bilateral primary osteoarthritis of knee: Secondary | ICD-10-CM | POA: Diagnosis not present

## 2015-03-14 DIAGNOSIS — M25562 Pain in left knee: Secondary | ICD-10-CM | POA: Diagnosis not present

## 2015-03-14 DIAGNOSIS — R2689 Other abnormalities of gait and mobility: Secondary | ICD-10-CM | POA: Diagnosis not present

## 2015-03-14 DIAGNOSIS — M25561 Pain in right knee: Secondary | ICD-10-CM | POA: Diagnosis not present

## 2015-03-20 DIAGNOSIS — L853 Xerosis cutis: Secondary | ICD-10-CM | POA: Diagnosis not present

## 2015-03-20 DIAGNOSIS — L309 Dermatitis, unspecified: Secondary | ICD-10-CM | POA: Diagnosis not present

## 2015-03-20 DIAGNOSIS — L404 Guttate psoriasis: Secondary | ICD-10-CM | POA: Diagnosis not present

## 2015-03-20 DIAGNOSIS — L308 Other specified dermatitis: Secondary | ICD-10-CM | POA: Diagnosis not present

## 2015-03-21 DIAGNOSIS — M25561 Pain in right knee: Secondary | ICD-10-CM | POA: Diagnosis not present

## 2015-03-21 DIAGNOSIS — M25562 Pain in left knee: Secondary | ICD-10-CM | POA: Diagnosis not present

## 2015-03-21 DIAGNOSIS — M170 Bilateral primary osteoarthritis of knee: Secondary | ICD-10-CM | POA: Diagnosis not present

## 2015-03-21 DIAGNOSIS — R2689 Other abnormalities of gait and mobility: Secondary | ICD-10-CM | POA: Diagnosis not present

## 2015-03-21 DIAGNOSIS — M1712 Unilateral primary osteoarthritis, left knee: Secondary | ICD-10-CM | POA: Diagnosis not present

## 2015-03-22 DIAGNOSIS — M25562 Pain in left knee: Secondary | ICD-10-CM | POA: Diagnosis not present

## 2015-03-22 DIAGNOSIS — R2689 Other abnormalities of gait and mobility: Secondary | ICD-10-CM | POA: Diagnosis not present

## 2015-03-22 DIAGNOSIS — M25561 Pain in right knee: Secondary | ICD-10-CM | POA: Diagnosis not present

## 2015-03-22 DIAGNOSIS — M1711 Unilateral primary osteoarthritis, right knee: Secondary | ICD-10-CM | POA: Diagnosis not present

## 2015-03-22 DIAGNOSIS — M170 Bilateral primary osteoarthritis of knee: Secondary | ICD-10-CM | POA: Diagnosis not present

## 2015-03-26 DIAGNOSIS — M25561 Pain in right knee: Secondary | ICD-10-CM | POA: Diagnosis not present

## 2015-03-26 DIAGNOSIS — M25562 Pain in left knee: Secondary | ICD-10-CM | POA: Diagnosis not present

## 2015-03-26 DIAGNOSIS — M170 Bilateral primary osteoarthritis of knee: Secondary | ICD-10-CM | POA: Diagnosis not present

## 2015-03-26 DIAGNOSIS — M1711 Unilateral primary osteoarthritis, right knee: Secondary | ICD-10-CM | POA: Diagnosis not present

## 2015-03-26 DIAGNOSIS — R2689 Other abnormalities of gait and mobility: Secondary | ICD-10-CM | POA: Diagnosis not present

## 2015-04-16 ENCOUNTER — Encounter: Payer: Self-pay | Admitting: Internal Medicine

## 2015-04-25 DIAGNOSIS — R3915 Urgency of urination: Secondary | ICD-10-CM | POA: Diagnosis not present

## 2015-04-25 DIAGNOSIS — L401 Generalized pustular psoriasis: Secondary | ICD-10-CM | POA: Diagnosis not present

## 2015-04-25 DIAGNOSIS — N401 Enlarged prostate with lower urinary tract symptoms: Secondary | ICD-10-CM | POA: Diagnosis not present

## 2015-05-02 DIAGNOSIS — L409 Psoriasis, unspecified: Secondary | ICD-10-CM | POA: Diagnosis not present

## 2015-05-02 DIAGNOSIS — L309 Dermatitis, unspecified: Secondary | ICD-10-CM | POA: Diagnosis not present

## 2015-05-08 DIAGNOSIS — I4891 Unspecified atrial fibrillation: Secondary | ICD-10-CM | POA: Diagnosis not present

## 2015-05-08 DIAGNOSIS — I10 Essential (primary) hypertension: Secondary | ICD-10-CM | POA: Diagnosis not present

## 2015-05-08 DIAGNOSIS — E119 Type 2 diabetes mellitus without complications: Secondary | ICD-10-CM | POA: Diagnosis not present

## 2015-05-10 ENCOUNTER — Ambulatory Visit (INDEPENDENT_AMBULATORY_CARE_PROVIDER_SITE_OTHER): Payer: Medicare Other

## 2015-05-10 VITALS — BP 134/70 | Ht 72.0 in | Wt 249.2 lb

## 2015-05-10 DIAGNOSIS — Z0000 Encounter for general adult medical examination without abnormal findings: Secondary | ICD-10-CM

## 2015-05-10 NOTE — Progress Notes (Signed)
Medical screening examination/treatment/procedure(s) were performed by non-physician practitioner and as supervising physician I was immediately available for consultation/collaboration. I agree with above. Leonarda Leis A Sebron Mcmahill, MD 

## 2015-05-10 NOTE — Progress Notes (Signed)
Subjective:   Nathan Russo is a 75 y.o.  Male who presents for Medicare Annual (Subsequent) preventive examination.  Review of Systems:   Cardiac Risk Factors include: diabetes mellitus;dyslipidemia;hypertension;male gender;obesity (BMI >30kg/m2);sedentary lifestyle HRA assessment completed during visit; Nathan Russo The Patient was informed that this wellness visit is to identify risk and educate on how to reduce risk for increase disease through lifestyle changes.   ROS deferred to CPE exam with physician  3 dtr/ one lives in [place]; [name] and [name]; 5 grands 2 great children  Medical and family hx Mother had HTN; OA; Goiter Father; MI and stroke;  cancer/ 22; late in life  Sister Cancer/ passed away a few years ago; Brother DM  Brother had colon cancer and then had MI   Medical issues  DM2 (lipids 03/2013; cho 184; trig 155; HDL 33; LDL 119 ) A1c 7.2  Is not checking BS; A1c drawn in 2015; agreed to make an apt with [name] for fup labs;  Atrial fib; managed medically [place] neoplasm thyroid 2012/ Stage IV; clear x  4 years now;  Took out 11 lymph and nodules; cancer was on nodes and vocal colds;  fup Radiation  and in isolation x 2 weeks;  Found thyroid nodules via carotid doffler  Other misc skin cancers;   BMI: 33.8  Diet; Breakfast; Peanut butter pretzels; 12 or 12 wheat thinks; cheese nips Lunch chicken sandwich; Chinese rest; olive garden soup and salad Supper; doesn't eat if they eat lunch;  Vegetables; does eat   Exercise; Work out in the yard; no set routine;  Has left knee in 1980;  Flexogenic shots are helping Cortisone; synvisc; hydrosulfuric acid;  Q 3 months; puts on rubberized pants; 0 gravity; 65% body weight  Scale of 1 to 10 hurts 4 to 5; Walks with a cane Discussed knee surgery; the patient voices fear of surgery; secondary to blood thinner; stated to discuss with his doctor; can admit for heparin;  Would also be a window of opportunity  as to surgery; Wants to try the hyaluronic acid first; states there are only 4 places that do this in [place]>   SAFETY;  Safety reviewed for the home;  Removal of clutter clearing paths through the home,  Railing as needed; avoid climbing Bathroom safety; shower in tub and separate shower  Community safety; YES Smoke detectors yes Firearms safety / revived for safety Driving accidents and seatbelt/ no Sun protection/ maybe in the summer  Stressors ; pain  Medication review/ good understanding of meds; not taking lasix; Seen it was ordered based on chest xray; The patient states he did not take it. Voices medication and why he takes it; Has psoriasis  Fall assessment / no falls Gait assessment; slow and cautious, walks with cane  Mobilization and Functional losses in the last year./ more knee pain Sleep patterns no issues  Urinary or fecal incontinence reviewed/ none  Counseling: Foot exam; Does check his feet every day A1c 7.2 / blood / discussed apt with [name] to check annual labs, including A1c  PSA 4.42 Colonoscopy; 07/2011; 07/2016; family hx of colon cancer EKG: 03/2012 Hearing: 2000 hz left; 4000 in right  Ophthalmology exam; TBS soon Immunizations: zostavax/ discussed; Part D  [facility] 12/10/2014 Health advice or referrals    Current Care Team reviewed and updated      Objective:     Vitals: BP 134/70 mmHg  Ht 6' (1.829 m)  Wt 249  lb 4 oz (113.059 kg)  BMI 33.80 kg/m2  Tobacco History  Smoking status  . Former Smoker  Smokeless tobacco  . Never Used     Counseling given: Not Answered   Past Medical History  Diagnosis Date  . THYROID NODULE 02/07/2010  . DIABETES MELLITUS, TYPE II 04/28/2007  . HYPERLIPIDEMIA 04/28/2007  . OBESITY 10/24/2006  . CARPAL TUNNEL SYNDROME, BILATERAL 02/07/2009  . OTITIS MEDIA, ACUTE, LEFT 12/26/2009  . HYPERTENSION 10/21/2006  . BRADYCARDIA, CHRONIC 10/24/2006  . GERD 02/11/2009  . PHIMOSIS 02/07/2009  .  DEGENERATIVE JOINT DISEASE, RIGHT KNEE 10/24/2006  . Dizziness and giddiness 12/19/2009  . FATIGUE 04/28/2007  . Headache(784.0) 12/19/2009  . NECK MASS 02/07/2010  . CHEST PAIN-UNSPECIFIED 09/05/2008  . COLONIC POLYPS, HX OF 04/28/2007  . Abdominal pain, epigastric 04/11/2010  . CHOLELITHIASIS 04/22/2010  . BELCHING 04/23/2010  . Thyroid cancer ([place]) 06/13/2010  . Cholelithiasis 06/13/2010  . Elevated PSA 06/13/2010  . S/P laparoscopic cholecystectomy 10/31/2010  . Depression 02/23/2011   Past Surgical History  Procedure Laterality Date  . Left knee surgery    . Right wrist surgury    . Cholecystectomy    . Thyroid surgery     Family History  Problem Relation Age of Onset  . Hypertension Mother   . Arthritis Mother   . Goiter Mother   . Heart attack Father 63  . Cancer Sister     breast  . Hypothyroidism Sister   . Heart attack Brother 56  . Diabetes Brother   . Cancer Brother     colon  . Colon cancer Brother 68   History  Sexual Activity  . Sexual Activity: Not on file    Outpatient Encounter Prescriptions as of 05/10/2015  Medication Sig  . amLODipine (NORVASC) 5 MG tablet Take 0.5 tablets (2.5 mg total) by mouth daily.  . Ascorbic Acid (VITAMIN C) 1000 MG tablet Take 1,000 mg by mouth daily.    . Blood Glucose Monitoring Suppl (ACCU-CHEK COMPACT CARE KIT) KIT by Does not apply route.    . Calcium 500-125 MG-UNIT TABS 1 tablet daily.  . calcium carbonate (OS-CAL) 600 MG TABS Take 600 mg by mouth daily.    . Cinnamon 500 MG capsule Take 500 mg by mouth 2 (two) times daily.    . Flaxseed, Linseed, 1000 MG CAPS Take 1 capsule by mouth daily.  . flecainide (TAMBOCOR) 100 MG tablet Take 100 mg by mouth 2 (two) times daily.  . Garlic Oil (ODORLESS GARLIC) 161 MG TABS Take by mouth 2 (two) times daily.    . Ginger, Zingiber officinalis, (GINGER PO) Take by mouth 4 (four) times daily.    . Glucosamine-Chondroit-Vit C-Mn (GLUCOSAMINE CHONDROITIN COMPLX) CAPS Take by mouth daily.    .  Glucose Blood (ACCU-CHEK INSTANT GLUCOSE TEST VI) by In Vitro route 4 (four) times daily.    [name] levothyroxine (SYNTHROID, LEVOTHROID) 125 MCG tablet TAKE ONE TABLET BY MOUTH ONCE DAILY BEFORE  BREAKFAST  . losartan (COZAAR) 100 MG tablet Take 1 tablet (100 mg total) by mouth daily.  . metFORMIN (GLUCOPHAGE-XR) 500 MG 24 hr tablet Take 1 tablet (500 mg total) by mouth 3 (three) times daily.  . Multiple Vitamin (MULTIVITAMIN) capsule Take 1 capsule by mouth daily.    . Omega-3 Fatty Acids (FISH OIL) 1000 MG CAPS Take by mouth 2 (two) times daily.    [name] omeprazole (PRILOSEC) 20 MG capsule TAKE ONE CAPSULE BY MOUTH TWICE DAILY  . triamcinolone (KENALOG) 0.025 %  cream Apply 1 application topically 2 (two) times daily.  . Triamcinolone Acetonide (TRIAMCINOLONE 0.1 % CREAM : EUCERIN) CREA Apply 1 application topically.  [name] VITAMIN D, CHOLECALCIFEROL, PO Take by mouth daily.    . [DISCONTINUED] levofloxacin (LEVAQUIN) 250 MG tablet Take 1 tablet (250 mg total) by mouth daily.  [name] apixaban (ELIQUIS) 5 MG TABS tablet Take by mouth 2 (two) times daily.  [name] diltiazem (CARDIZEM CD) 120 MG 24 hr capsule Take 1 capsule (120 mg total) by mouth 2 (two) times daily.  . furosemide (LASIX) 40 MG tablet 1 by mouth per day as needed (Patient not taking: Reported on 05/10/2015)  . Misc Natural Products (OSTEO BI-FLEX TRIPLE STRENGTH) TABS Take 1 tablet by mouth.  . [DISCONTINUED] benzonatate (TESSALON) 100 MG capsule 1-2 tabs by mouth every 6 hrs as needed (Patient not taking: Reported on 05/10/2015)  . [DISCONTINUED] cephALEXin (KEFLEX) 500 MG capsule Take 1 capsule (500 mg total) by mouth 2 (two) times daily. (Patient not taking: Reported on 01/11/2015)   No facility-administered encounter medications on file as of 05/10/2015.    Activities of Daily Living In your present state of health, do you have any difficulty performing the following activities: 05/10/2015  Vision? N  Difficulty concentrating or making decisions? N    Walking or climbing stairs? Y  Dressing or bathing? N  Doing errands, shopping? N  Preparing Food and eating ? N  Using the Toilet? N  In the past six months, have you accidently leaked urine? N  Do you have problems with loss of bowel control? N  Managing your Medications? N  Managing your Finances? N  Housekeeping or managing your Housekeeping? N    Patient Care Team: Biagio Borg, MD as PCP - General    Assessment:   Assessment   Patient presents for yearly preventative medicine examination. Medicare questionnaire screening were completed, i.e. Functional loss due to worsening of knee; fall risk due to knee;  depression, memory loss and hearing were unremarkable/  All immunizations and health maintenance protocols were reviewed with the patient and [facility];   Education provided for laboratory screens;  Will make apt with [name] for labs  Medication reconciliation, past medical history, social history, problem list and allergies were reviewed in detail with the patient  Goals were established with regard to mobility and consideration of options for knee as mobility is limited  End of life planning was discussed and completed    Exercise Activities and Dietary recommendations    Goals    . patient     Wants to have left knee replacement 1981; goes back April 20th Trying to use bone on bone;         Fall Risk Fall Risk  05/10/2015 09/14/2013 03/22/2013  Falls in the past year? No Yes Yes  Number falls in past yr: - 1 1  Injury with Fall? - No No   Depression Screen PHQ 2/9 Scores 05/10/2015 09/14/2013 03/22/2013  PHQ - 2 Score 0 0 0     Cognitive Testing No flowsheet data found.  AD8 score 0  Immunization History  Administered Date(s) Administered  . Influenza Split 12/13/2010  . Influenza Whole 12/09/2007, 12/13/2009  . Influenza, High Dose Seasonal PF 12/10/2014  . Influenza, Seasonal, Injecte, Preservative Fre  12/08/2012  . Pneumococcal Conjugate-13 03/22/2013  . Pneumococcal Polysaccharide-23 01/08/2006  . Td 05/17/2009   Screening Tests Health Maintenance  Topic Date Due  .  OPHTHALMOLOGY EXAM  07/25/1950  . ZOSTAVAX  07/26/1998  . FOOT EXAM  09/15/2014  . HEMOGLOBIN A1C  10/18/2014  . INFLUENZA VACCINE  06/08/2015 (Originally 10/09/2014)  . COLONOSCOPY  08/04/2016  . TETANUS/TDAP  05/18/2019  . PNA vac Low Risk Adult  Completed      Plan:   Will schedule apt with [name] for labs  Will fup with current doctor addressing knee for injections of Hyaluronic acid to knee;  The patient was educated and counseled about the following appropriate screening and preventive services:   Vaccines to include Pneumoccal, Influenza, Hepatitis B, Td, Zostavax, HCV  Had flu; educated regarding shingles  Electrocardiogram/ 03/2012  Cardiovascular Disease/ bP medically managed  Colorectal cancer screening/ due 07/2016   Diabetes screening/ due to be checked  Glaucoma screening/ due and will be scheduled.  Nutrition counseling/ did not discuss weight management;   Exercise limited; discussed risk;   Patient Instructions (the written plan) was given to the patient.   Wynetta Fines, RN  05/10/2015

## 2015-05-10 NOTE — Patient Instructions (Addendum)
Nathan Russo , Thank you for taking time to come for your Medicare Wellness Visit. I appreciate your ongoing commitment to your health goals. Please review the following plan we discussed and let me know if I can assist you in the future.   Agrees to schedule eye exam  Documented High does flu shot in 12/2014 at [facility]  Will see [name] to fup on annual labs.   Next apt for knee injection is in April   These are the goals we discussed: Goals    . patient     Wants to have left knee replacement 1981; goes back April 20th Trying to use bone on bone;          This is a list of the screening recommended for you and due dates:  Health Maintenance  Topic Date Due  . Eye exam for diabetics  07/25/1950  . Shingles Vaccine  07/26/1998  . Complete foot exam   09/15/2014  . Hemoglobin A1C  10/18/2014  . Flu Shot  06/08/2015*  . Colon Cancer Screening  08/04/2016  . Tetanus Vaccine  05/18/2019  . Pneumonia vaccines  Completed  *Topic was postponed. The date shown is not the original due date.     Fall Prevention in the Home  Falls can cause injuries. They can happen to people of all ages. There are many things you can do to make your home safe and to help prevent falls.  WHAT CAN I DO ON THE OUTSIDE OF MY HOME?  Regularly fix the edges of walkways and driveways and fix any cracks.  Remove anything that might make you trip as you walk through a door, such as a raised step or threshold.  Trim any bushes or trees on the path to your home.  Use bright outdoor lighting.  Clear any walking paths of anything that might make someone trip, such as rocks or tools.  Regularly check to see if handrails are loose or broken. Make sure that both sides of any steps have handrails.  Any raised decks and porches should have guardrails on the edges.  Have any leaves, snow, or ice cleared regularly.  Use sand or salt on walking paths during winter.  Clean up any spills in your garage right  away. This includes oil or grease spills. WHAT CAN I DO IN THE BATHROOM?   Use night lights.  Install grab bars by the toilet and in the tub and shower. Do not use towel bars as grab bars.  Use non-skid mats or decals in the tub or shower.  If you need to sit down in the shower, use a plastic, non-slip stool.  Keep the floor dry. Clean up any water that spills on the floor as soon as it happens.  Remove soap buildup in the tub or shower regularly.  Attach bath mats securely with double-sided non-slip rug tape.  Do not have throw rugs and other things on the floor that can make you trip. WHAT CAN I DO IN THE BEDROOM?  Use night lights.  Make sure that you have a light by your bed that is easy to reach.  Do not use any sheets or blankets that are too big for your bed. They should not hang down onto the floor.  Have a firm chair that has side arms. You can use this for support while you get dressed.  Do not have throw rugs and other things on the floor that can make you trip. WHAT CAN  I DO IN THE KITCHEN?  Clean up any spills right away.  Avoid walking on wet floors.  Keep items that you use a lot in easy-to-reach places.  If you need to reach something above you, use a strong step stool that has a grab bar.  Keep electrical cords out of the way.  Do not use floor polish or wax that makes floors slippery. If you must use wax, use non-skid floor wax.  Do not have throw rugs and other things on the floor that can make you trip. WHAT CAN I DO WITH MY STAIRS?  Do not leave any items on the stairs.  Make sure that there are handrails on both sides of the stairs and use them. Fix handrails that are broken or loose. Make sure that handrails are as long as the stairways.  Check any carpeting to make sure that it is firmly attached to the stairs. Fix any carpet that is loose or worn.  Avoid having throw rugs at the top or bottom of the stairs. If you do have throw rugs, attach  them to the floor with carpet tape.  Make sure that you have a light switch at the top of the stairs and the bottom of the stairs. If you do not have them, ask someone to add them for you. WHAT ELSE CAN I DO TO HELP PREVENT FALLS?  Wear shoes that:  Do not have high heels.  Have rubber bottoms.  Are comfortable and fit you well.  Are closed at the toe. Do not wear sandals.  If you use a stepladder:  Make sure that it is fully opened. Do not climb a closed stepladder.  Make sure that both sides of the stepladder are locked into place.  Ask someone to hold it for you, if possible.  Clearly mark and make sure that you can see:  Any grab bars or handrails.  First and last steps.  Where the edge of each step is.  Use tools that help you move around (mobility aids) if they are needed. These include:  Canes.  Walkers.  Scooters.  Crutches.  Turn on the lights when you go into a dark area. Replace any light bulbs as soon as they burn out.  Set up your furniture so you have a clear path. Avoid moving your furniture around.  If any of your floors are uneven, fix them.  If there are any pets around you, be aware of where they are.  Review your medicines with your doctor. Some medicines can make you feel dizzy. This can increase your chance of falling. Ask your doctor what other things that you can do to help prevent falls.   This information is not intended to replace advice given to you by your health care provider. Make sure you discuss any questions you have with your health care provider.   Document Released: 12/21/2008 Document Revised: 07/11/2014 Document Reviewed: 03/31/2014 Elsevier Interactive Patient Education 2016 [place] Maintenance, Male A healthy lifestyle and preventative care can promote health and wellness.  Maintain regular health, dental, and eye exams.  Eat a healthy diet. Foods like vegetables, fruits, whole grains, low-fat dairy  products, and lean protein foods contain the nutrients you need and are low in calories. Decrease your intake of foods high in solid fats, added sugars, and salt. Get information about a proper diet from your health care provider, if necessary.  Regular physical exercise is one of the most important things you  can do for your health. Most adults should get at least 150 minutes of moderate-intensity exercise (any activity that increases your heart rate and causes you to sweat) each week. In addition, most adults need muscle-strengthening exercises on 2 or more days a week.   Maintain a healthy weight. The body mass index (BMI) is a screening tool to identify possible weight problems. It provides an estimate of body fat based on height and weight. Your health care provider can find your BMI and can help you achieve or maintain a healthy weight. For males 20 years and older:  A BMI below 18.5 is considered underweight.  A BMI of 18.5 to 24.9 is normal.  A BMI of 25 to 29.9 is considered overweight.  A BMI of 30 and above is considered obese.  Maintain normal blood lipids and cholesterol by exercising and minimizing your intake of saturated fat. Eat a balanced diet with plenty of fruits and vegetables. Blood tests for lipids and cholesterol should begin at age 55 and be repeated every 5 years. If your lipid or cholesterol levels are high, you are over age 29, or you are at high risk for heart disease, you may need your cholesterol levels checked more frequently.Ongoing high lipid and cholesterol levels should be treated with medicines if diet and exercise are not working.  If you smoke, find out from your health care provider how to quit. If you do not use tobacco, do not start.  Lung cancer screening is recommended for adults aged 22-80 years who are at high risk for developing lung cancer because of a history of smoking. A yearly low-dose CT scan of the lungs is recommended for people who have at  least a 30-pack-year history of smoking and are current smokers or have quit within the past 15 years. A pack year of smoking is smoking an average of 1 pack of cigarettes a day for 1 year (for example, a 30-pack-year history of smoking could mean smoking 1 pack a day for 30 years or 2 packs a day for 15 years). Yearly screening should continue until the smoker has stopped smoking for at least 15 years. Yearly screening should be stopped for people who develop a health problem that would prevent them from having lung cancer treatment.  If you choose to drink alcohol, do not have more than 2 drinks per day. One drink is considered to be 12 oz (360 mL) of beer, 5 oz (150 mL) of wine, or 1.5 oz (45 mL) of liquor.  Avoid the use of street drugs. Do not share needles with anyone. Ask for help if you need support or instructions about stopping the use of drugs.  High blood pressure causes heart disease and increases the risk of stroke. High blood pressure is more likely to develop in:  People who have blood pressure in the end of the normal range (100-139/85-89 mm Hg).  People who are overweight or obese.  People who are African American.  If you are 62-23 years of age, have your blood pressure checked every 3-5 years. If you are 48 years of age or older, have your blood pressure checked every year. You should have your blood pressure measured twice--once when you are at a hospital or clinic, and once when you are not at a hospital or clinic. Record the average of the two measurements. To check your blood pressure when you are not at a hospital or clinic, you can use:  An automated blood pressure machine  at a pharmacy.  A home blood pressure monitor.  If you are 44-40 years old, ask your health care provider if you should take aspirin to prevent heart disease.  Diabetes screening involves taking a blood sample to check your fasting blood sugar level. This should be done once every 3 years after age  53 if you are at a normal weight and without risk factors for diabetes. Testing should be considered at a younger age or be carried out more frequently if you are overweight and have at least 1 risk factor for diabetes.  Colorectal cancer can be detected and often prevented. Most routine colorectal cancer screening begins at the age of 16 and continues through age 59. However, your health care provider may recommend screening at an earlier age if you have risk factors for colon cancer. On a yearly basis, your health care provider may provide home test kits to check for hidden blood in the stool. A small camera at the end of a tube may be used to directly examine the colon (sigmoidoscopy or colonoscopy) to detect the earliest forms of colorectal cancer. Talk to your health care provider about this at age 29 when routine screening begins. A direct exam of the colon should be repeated every 5-10 years through age 68, unless early forms of precancerous polyps or small growths are found.  People who are at an increased risk for hepatitis B should be screened for this virus. You are considered at high risk for hepatitis B if:  You were born in a country where hepatitis B occurs often. Talk with your health care provider about which countries are considered high risk.  Your parents were born in a high-risk country and you have not received a shot to protect against hepatitis B (hepatitis B vaccine).  You have HIV or AIDS.  You use needles to inject street drugs.  You live with, or have sex with, someone who has hepatitis B.  You are a man who has sex with other men (MSM).  You get hemodialysis treatment.  You take certain medicines for conditions like cancer, organ transplantation, and autoimmune conditions.  Hepatitis C blood testing is recommended for all people born from [birth year] through 1967 and any individual with known risk factors for hepatitis C.  Healthy men should no longer receive  prostate-specific antigen (PSA) blood tests as part of routine cancer screening. Talk to your health care provider about prostate cancer screening.  Testicular cancer screening is not recommended for adolescents or adult males who have no symptoms. Screening includes self-exam, a health care provider exam, and other screening tests. Consult with your health care provider about any symptoms you have or any concerns you have about testicular cancer.  Practice safe sex. Use condoms and avoid high-risk sexual practices to reduce the spread of sexually transmitted infections (STIs).  You should be screened for STIs, including gonorrhea and chlamydia if:  You are sexually active and are younger than 24 years.  You are older than 24 years, and your health care provider tells you that you are at risk for this type of infection.  Your sexual activity has changed since you were last screened, and you are at an increased risk for chlamydia or gonorrhea. Ask your health care provider if you are at risk.  If you are at risk of being infected with HIV, it is recommended that you take a prescription medicine daily to prevent HIV infection. This is called pre-exposure prophylaxis (PrEP). You are considered  at risk if:  You are a man who has sex with other men (MSM).  You are a heterosexual man who is sexually active with multiple partners.  You take drugs by injection.  You are sexually active with a partner who has HIV.  Talk with your health care provider about whether you are at high risk of being infected with HIV. If you choose to begin PrEP, you should first be tested for HIV. You should then be tested every 3 months for as long as you are taking PrEP.  Use sunscreen. Apply sunscreen liberally and repeatedly throughout the day. You should seek shade when your shadow is shorter than you. Protect yourself by wearing long sleeves, pants, a wide-brimmed hat, and sunglasses year round whenever you are  outdoors.  Tell your health care provider of new moles or changes in moles, especially if there is a change in shape or color. Also, tell your health care provider if a mole is larger than the size of a pencil eraser.  A one-time screening for abdominal aortic aneurysm (AAA) and surgical repair of large AAAs by ultrasound is recommended for men aged 26-75 years who are current or former smokers.  Stay current with your vaccines (immunizations).   This information is not intended to replace advice given to you by your health care provider. Make sure you discuss any questions you have with your health care provider.   Document Released: 08/23/2007 Document Revised: 03/17/2014 Document Reviewed: 07/22/2010 Elsevier Interactive Patient Education [employer].

## 2015-05-28 DIAGNOSIS — I10 Essential (primary) hypertension: Secondary | ICD-10-CM | POA: Diagnosis not present

## 2015-05-28 DIAGNOSIS — I2510 Atherosclerotic heart disease of native coronary artery without angina pectoris: Secondary | ICD-10-CM | POA: Diagnosis not present

## 2015-05-28 DIAGNOSIS — I4891 Unspecified atrial fibrillation: Secondary | ICD-10-CM | POA: Diagnosis not present

## 2015-05-28 DIAGNOSIS — I509 Heart failure, unspecified: Secondary | ICD-10-CM | POA: Diagnosis not present

## 2015-05-28 DIAGNOSIS — R000 Tachycardia, unspecified: Secondary | ICD-10-CM | POA: Diagnosis not present

## 2015-05-28 DIAGNOSIS — I483 Typical atrial flutter: Secondary | ICD-10-CM | POA: Diagnosis not present

## 2015-05-28 DIAGNOSIS — E119 Type 2 diabetes mellitus without complications: Secondary | ICD-10-CM | POA: Diagnosis not present

## 2015-05-28 DIAGNOSIS — R079 Chest pain, unspecified: Secondary | ICD-10-CM | POA: Diagnosis not present

## 2015-05-29 ENCOUNTER — Other Ambulatory Visit: Payer: Self-pay

## 2015-05-29 MED ORDER — OMEPRAZOLE 20 MG PO CPDR: 20.0000 mg | DELAYED_RELEASE_CAPSULE | Freq: Two times a day (BID) | ORAL | Status: DC

## 2015-06-12 ENCOUNTER — Telehealth: Payer: Self-pay

## 2015-06-12 MED ORDER — DILTIAZEM HCL ER COATED BEADS 120 MG PO CP24: 120.0000 mg | ORAL_CAPSULE | Freq: Two times a day (BID) | ORAL | Status: DC

## 2015-06-12 MED ORDER — OMEPRAZOLE 20 MG PO CPDR: 20.0000 mg | DELAYED_RELEASE_CAPSULE | Freq: Two times a day (BID) | ORAL | Status: DC

## 2015-06-12 NOTE — Telephone Encounter (Signed)
erx sent to [facility] per fax rq.

## 2015-06-14 DIAGNOSIS — L308 Other specified dermatitis: Secondary | ICD-10-CM | POA: Diagnosis not present

## 2015-06-19 DIAGNOSIS — E119 Type 2 diabetes mellitus without complications: Secondary | ICD-10-CM | POA: Diagnosis not present

## 2015-06-19 DIAGNOSIS — I4891 Unspecified atrial fibrillation: Secondary | ICD-10-CM | POA: Diagnosis not present

## 2015-06-19 DIAGNOSIS — I10 Essential (primary) hypertension: Secondary | ICD-10-CM | POA: Diagnosis not present

## 2015-07-03 DIAGNOSIS — M1712 Unilateral primary osteoarthritis, left knee: Secondary | ICD-10-CM | POA: Diagnosis not present

## 2015-07-03 DIAGNOSIS — R262 Difficulty in walking, not elsewhere classified: Secondary | ICD-10-CM | POA: Diagnosis not present

## 2015-07-03 DIAGNOSIS — M170 Bilateral primary osteoarthritis of knee: Secondary | ICD-10-CM | POA: Diagnosis not present

## 2015-07-03 DIAGNOSIS — M25562 Pain in left knee: Secondary | ICD-10-CM | POA: Diagnosis not present

## 2015-07-04 DIAGNOSIS — B359 Dermatophytosis, unspecified: Secondary | ICD-10-CM | POA: Diagnosis not present

## 2015-07-05 DIAGNOSIS — I4892 Unspecified atrial flutter: Secondary | ICD-10-CM | POA: Diagnosis not present

## 2015-07-05 DIAGNOSIS — I4891 Unspecified atrial fibrillation: Secondary | ICD-10-CM | POA: Diagnosis not present

## 2015-07-07 DIAGNOSIS — Z882 Allergy status to sulfonamides status: Secondary | ICD-10-CM | POA: Diagnosis not present

## 2015-07-07 DIAGNOSIS — I10 Essential (primary) hypertension: Secondary | ICD-10-CM | POA: Diagnosis not present

## 2015-07-07 DIAGNOSIS — Z885 Allergy status to narcotic agent status: Secondary | ICD-10-CM | POA: Diagnosis not present

## 2015-07-07 DIAGNOSIS — I480 Paroxysmal atrial fibrillation: Secondary | ICD-10-CM | POA: Diagnosis not present

## 2015-07-09 DIAGNOSIS — M25562 Pain in left knee: Secondary | ICD-10-CM | POA: Diagnosis not present

## 2015-07-09 DIAGNOSIS — M1712 Unilateral primary osteoarthritis, left knee: Secondary | ICD-10-CM | POA: Diagnosis not present

## 2015-07-19 DIAGNOSIS — M1712 Unilateral primary osteoarthritis, left knee: Secondary | ICD-10-CM | POA: Diagnosis not present

## 2015-07-19 DIAGNOSIS — M25562 Pain in left knee: Secondary | ICD-10-CM | POA: Diagnosis not present

## 2015-07-24 DIAGNOSIS — M25562 Pain in left knee: Secondary | ICD-10-CM | POA: Diagnosis not present

## 2015-07-24 DIAGNOSIS — M1712 Unilateral primary osteoarthritis, left knee: Secondary | ICD-10-CM | POA: Diagnosis not present

## 2015-09-05 DIAGNOSIS — Z85850 Personal history of malignant neoplasm of thyroid: Secondary | ICD-10-CM | POA: Diagnosis not present

## 2015-09-05 DIAGNOSIS — C73 Malignant neoplasm of thyroid gland: Secondary | ICD-10-CM | POA: Diagnosis not present

## 2015-09-05 DIAGNOSIS — Z08 Encounter for follow-up examination after completed treatment for malignant neoplasm: Secondary | ICD-10-CM | POA: Diagnosis not present

## 2015-09-05 DIAGNOSIS — E890 Postprocedural hypothyroidism: Secondary | ICD-10-CM | POA: Diagnosis not present

## 2015-09-07 DIAGNOSIS — I471 Supraventricular tachycardia: Secondary | ICD-10-CM | POA: Insufficient documentation

## 2015-09-07 DIAGNOSIS — I10 Essential (primary) hypertension: Secondary | ICD-10-CM | POA: Diagnosis not present

## 2015-09-07 DIAGNOSIS — I4719 Other supraventricular tachycardia: Secondary | ICD-10-CM | POA: Insufficient documentation

## 2015-09-07 DIAGNOSIS — I4891 Unspecified atrial fibrillation: Secondary | ICD-10-CM | POA: Diagnosis not present

## 2015-11-07 ENCOUNTER — Other Ambulatory Visit: Payer: Self-pay | Admitting: Internal Medicine

## 2015-11-07 ENCOUNTER — Encounter: Payer: Self-pay | Admitting: Internal Medicine

## 2015-11-07 ENCOUNTER — Ambulatory Visit (INDEPENDENT_AMBULATORY_CARE_PROVIDER_SITE_OTHER): Payer: Medicare Other | Admitting: Internal Medicine

## 2015-11-07 ENCOUNTER — Other Ambulatory Visit (INDEPENDENT_AMBULATORY_CARE_PROVIDER_SITE_OTHER): Payer: Medicare Other

## 2015-11-07 VITALS — BP 124/78 | HR 61 | Temp 98.7°F | Resp 20 | Wt 235.0 lb

## 2015-11-07 DIAGNOSIS — Z23 Encounter for immunization: Secondary | ICD-10-CM | POA: Diagnosis not present

## 2015-11-07 DIAGNOSIS — N320 Bladder-neck obstruction: Secondary | ICD-10-CM

## 2015-11-07 DIAGNOSIS — E119 Type 2 diabetes mellitus without complications: Secondary | ICD-10-CM

## 2015-11-07 DIAGNOSIS — I10 Essential (primary) hypertension: Secondary | ICD-10-CM

## 2015-11-07 DIAGNOSIS — E785 Hyperlipidemia, unspecified: Secondary | ICD-10-CM | POA: Diagnosis not present

## 2015-11-07 LAB — CBC WITH DIFFERENTIAL/PLATELET
BASOS ABS: 0 10*3/uL (ref 0.0–0.1)
Basophils Relative: 0.4 % (ref 0.0–3.0)
EOS ABS: 0.3 10*3/uL (ref 0.0–0.7)
Eosinophils Relative: 3.2 % (ref 0.0–5.0)
HEMATOCRIT: 44.7 % (ref 39.0–52.0)
HEMOGLOBIN: 15.4 g/dL (ref 13.0–17.0)
LYMPHS PCT: 23.5 % (ref 12.0–46.0)
Lymphs Abs: 2 10*3/uL (ref 0.7–4.0)
MCHC: 34.5 g/dL (ref 30.0–36.0)
MCV: 87.6 fl (ref 78.0–100.0)
Monocytes Absolute: 0.5 10*3/uL (ref 0.1–1.0)
Monocytes Relative: 5.7 % (ref 3.0–12.0)
Neutro Abs: 5.7 10*3/uL (ref 1.4–7.7)
Neutrophils Relative %: 67.2 % (ref 43.0–77.0)
PLATELETS: 322 10*3/uL (ref 150.0–400.0)
RBC: 5.11 Mil/uL (ref 4.22–5.81)
RDW: 13.5 % (ref 11.5–15.5)
WBC: 8.5 10*3/uL (ref 4.0–10.5)

## 2015-11-07 LAB — PSA: PSA: 3.17 ng/mL (ref 0.10–4.00)

## 2015-11-07 LAB — HEPATIC FUNCTION PANEL
ALBUMIN: 4.4 g/dL (ref 3.5–5.2)
ALT: 27 U/L (ref 0–53)
AST: 23 U/L (ref 0–37)
Alkaline Phosphatase: 58 U/L (ref 39–117)
Bilirubin, Direct: 0.2 mg/dL (ref 0.0–0.3)
TOTAL PROTEIN: 6.9 g/dL (ref 6.0–8.3)
Total Bilirubin: 1 mg/dL (ref 0.2–1.2)

## 2015-11-07 LAB — BASIC METABOLIC PANEL
BUN: 24 mg/dL — AB (ref 6–23)
CHLORIDE: 105 meq/L (ref 96–112)
CO2: 27 meq/L (ref 19–32)
CREATININE: 1.33 mg/dL (ref 0.40–1.50)
Calcium: 9.4 mg/dL (ref 8.4–10.5)
GFR: 55.37 mL/min — ABNORMAL LOW (ref >60.00)
Glucose, Bld: 140 mg/dL — ABNORMAL HIGH (ref 70–99)
Potassium: 4.3 mEq/L (ref 3.5–5.1)
Sodium: 141 mEq/L (ref 135–145)

## 2015-11-07 LAB — URINALYSIS, ROUTINE W REFLEX MICROSCOPIC
BILIRUBIN URINE: NEGATIVE
HGB URINE DIPSTICK: NEGATIVE
Ketones, ur: NEGATIVE
LEUKOCYTES UA: NEGATIVE
Nitrite: NEGATIVE
RBC / HPF: NONE SEEN (ref 0–?)
Specific Gravity, Urine: >=1.03 — AB (ref 1.000–1.030)
TOTAL PROTEIN, URINE-UPE24: NEGATIVE
UROBILINOGEN UA: 0.2 (ref 0.0–1.0)
Urine Glucose: NEGATIVE
WBC UA: NONE SEEN (ref 0–?)
pH: 5.5 (ref 5.0–8.0)

## 2015-11-07 LAB — LDL CHOLESTEROL, DIRECT: LDL DIRECT: 113 mg/dL

## 2015-11-07 LAB — LIPID PANEL
CHOL/HDL RATIO: 6
CHOLESTEROL: 189 mg/dL (ref 0–200)
HDL: 32.9 mg/dL — ABNORMAL LOW (ref >39.00)
NonHDL: 156.01
TRIGLYCERIDES: 278 mg/dL — AB (ref 0.0–149.0)
VLDL: 55.6 mg/dL — ABNORMAL HIGH (ref 0.0–40.0)

## 2015-11-07 LAB — HEMOGLOBIN A1C: Hgb A1c MFr Bld: 7.6 % — ABNORMAL HIGH (ref 4.6–6.5)

## 2015-11-07 LAB — TSH: TSH: 1.51 u[IU]/mL (ref 0.35–4.50)

## 2015-11-07 LAB — MICROALBUMIN / CREATININE URINE RATIO
CREATININE, U: 148.4 mg/dL
Microalb Creat Ratio: 0.5 mg/g (ref 0.0–30.0)

## 2015-11-07 MED ORDER — PRAVASTATIN SODIUM 20 MG PO TABS: 20.0000 mg | ORAL_TABLET | Freq: Every day | ORAL | 3 refills | Status: DC

## 2015-11-07 MED ORDER — GLUCOSE BLOOD VI STRP: ORAL_STRIP | 12 refills | Status: DC

## 2015-11-07 MED ORDER — METFORMIN HCL ER 500 MG PO TB24: ORAL_TABLET | ORAL | 3 refills | Status: DC

## 2015-11-07 MED ORDER — BLOOD GLUCOSE MONITOR KIT: PACK | 0 refills | Status: DC

## 2015-11-07 NOTE — Patient Instructions (Addendum)

## 2015-11-07 NOTE — Assessment & Plan Note (Signed)
stable overall by history and exam, recent data reviewed with pt, and pt to continue medical treatment as before,  to f/u any worsening symptoms or concerns Lab Results  Component Value Date   HGBA1C 7.2 (H) 04/19/2014

## 2015-11-07 NOTE — Assessment & Plan Note (Signed)
stable overall by history and exam, recent data reviewed with pt, and pt to continue medical treatment as before,  to f/u any worsening symptoms or concerns Lab Results  Component Value Date   LDLCALC 119 (H) 03/22/2013   Goal < 70, for f/u lab today

## 2015-11-07 NOTE — Progress Notes (Signed)
Pre visit review using our clinic review tool, if applicable. No additional management support is needed unless otherwise documented below in the visit note. 

## 2015-11-07 NOTE — Progress Notes (Signed)
Subjective:    Patient ID: Nathan Russo, male    DOB: 1940-12-05, 75 y.o.   MRN: [id number]    HPI  Here for yearly f/u;  Overall doing ok;  Pt denies Chest pain, worsening SOB, DOE, wheezing, orthopnea, PND, worsening LE edema, palpitations, dizziness or syncope.  Pt denies neurological change such as new headache, facial or extremity weakness.  Pt denies polydipsia, polyuria, or low sugar symptoms. Pt states overall good compliance with treatment and medications, good tolerability, and has been trying to follow appropriate diet.  Pt denies worsening depressive symptoms, suicidal ideation or panic. No fever, night sweats, wt loss, loss of appetite, or other constitutional symptoms.  Pt states good ability with ADL's, has low fall risk, home safety reviewed and adequate, no other significant changes in hearing or vision, and only occasionally active with exercise.  Wt has come down several lbs, pt states intentional with better diet.   Wt Readings from Last 3 Encounters:  11/07/15 235 lb (106.6 kg)  05/10/15 249 lb 4 oz (113.1 kg)  01/11/15 244 lb (110.7 kg)  . Past Medical History:  Diagnosis Date  . Abdominal pain, epigastric 04/11/2010  . BELCHING 04/23/2010  . BRADYCARDIA, CHRONIC 10/24/2006  . CARPAL TUNNEL SYNDROME, BILATERAL 02/07/2009  . CHEST PAIN-UNSPECIFIED 09/05/2008  . CHOLELITHIASIS 04/22/2010  . Cholelithiasis 06/13/2010  . COLONIC POLYPS, HX OF 04/28/2007  . DEGENERATIVE JOINT DISEASE, RIGHT KNEE 10/24/2006  . Depression 02/23/2011  . DIABETES MELLITUS, TYPE II 04/28/2007  . Dizziness and giddiness 12/19/2009  . Elevated PSA 06/13/2010  . FATIGUE 04/28/2007  . GERD 02/11/2009  . Headache(784.0) 12/19/2009  . HYPERLIPIDEMIA 04/28/2007  . HYPERTENSION 10/21/2006  . NECK MASS 02/07/2010  . OBESITY 10/24/2006  . OTITIS MEDIA, ACUTE, LEFT 12/26/2009  . PHIMOSIS 02/07/2009  . S/P laparoscopic cholecystectomy 10/31/2010  . Thyroid cancer ([place]) 06/13/2010  . THYROID NODULE 02/07/2010   Past  Surgical History:  Procedure Laterality Date  . CHOLECYSTECTOMY    . left knee surgery    . right wrist surgury    . THYROID SURGERY      reports that he has quit smoking. He has never used smokeless tobacco. He reports that he does not drink alcohol or use drugs. family history includes Arthritis in his mother; Cancer in his brother and sister; Colon cancer (age of onset: 31) in his brother; Diabetes in his brother; Goiter in his mother; Heart attack (age of onset: 8) in his brother; Heart attack (age of onset: 33) in his father; Hypertension in his mother; Hypothyroidism in his sister. Allergies  Allergen Reactions  . Ace Inhibitors     REACTION: cough  . Atenolol     REACTION: bradycardia  . Codeine Other (See Comments)    Head spins "wild"  . Lovastatin     REACTION: myalygros  . Morphine Nausea And Vomiting  . Sulfa Antibiotics Other (See Comments)    As child almost died  . Tramadol     Ants crawling all over   Current Outpatient Prescriptions on File Prior to Visit  Medication Sig Dispense Refill  . amLODipine (NORVASC) 5 MG tablet Take 0.5 tablets (2.5 mg total) by mouth daily. 45 tablet 3  . apixaban (ELIQUIS) 5 MG TABS tablet Take by mouth 2 (two) times daily.    . Ascorbic Acid (VITAMIN C) 1000 MG tablet Take 1,000 mg by mouth daily.      . Calcium 500-125 MG-UNIT TABS 1 tablet daily.    [name]  calcium carbonate (OS-CAL) 600 MG TABS Take 600 mg by mouth daily.      . Cinnamon 500 MG capsule Take 500 mg by mouth 2 (two) times daily.      [name] diltiazem (CARDIZEM CD) 120 MG 24 hr capsule Take 1 capsule (120 mg total) by mouth 2 (two) times daily. 180 capsule 3  . Flaxseed, Linseed, 1000 MG CAPS Take 1 capsule by mouth daily.    . flecainide (TAMBOCOR) 100 MG tablet Take 100 mg by mouth 2 (two) times daily.    . furosemide (LASIX) 40 MG tablet 1 by mouth per day as needed 30 tablet 3  . Garlic Oil (ODORLESS GARLIC) XX123456 MG TABS Take by mouth 2 (two) times daily.      . Ginger,  Zingiber officinalis, (GINGER PO) Take by mouth 4 (four) times daily.      . Glucosamine-Chondroit-Vit C-Mn (GLUCOSAMINE CHONDROITIN COMPLX) CAPS Take by mouth daily.      [name] levothyroxine (SYNTHROID, LEVOTHROID) 125 MCG tablet TAKE ONE TABLET BY MOUTH ONCE DAILY BEFORE  BREAKFAST 90 tablet 3  . losartan (COZAAR) 100 MG tablet Take 1 tablet (100 mg total) by mouth daily. 90 tablet 3  . metFORMIN (GLUCOPHAGE-XR) 500 MG 24 hr tablet Take 1 tablet (500 mg total) by mouth 3 (three) times daily. 270 tablet 3  . Misc Natural Products (OSTEO BI-FLEX TRIPLE STRENGTH) TABS Take 1 tablet by mouth.    . Multiple Vitamin (MULTIVITAMIN) capsule Take 1 capsule by mouth daily.      . Omega-3 Fatty Acids (FISH OIL) 1000 MG CAPS Take by mouth 2 (two) times daily.      [name] omeprazole (PRILOSEC) 20 MG capsule Take 1 capsule (20 mg total) by mouth 2 (two) times daily. 180 capsule 3  . triamcinolone (KENALOG) 0.025 % cream Apply 1 application topically 2 (two) times daily.    . Triamcinolone Acetonide (TRIAMCINOLONE 0.1 % CREAM : EUCERIN) CREA Apply 1 application topically.    [name] VITAMIN D, CHOLECALCIFEROL, PO Take by mouth daily.       No current facility-administered medications on file prior to visit.      Review of Systems Constitutional: Negative for increased diaphoresis, or other activity, appetite or siginficant weight change other than noted HENT: Negative for worsening hearing loss, ear pain, facial swelling, mouth sores and neck stiffness.   Eyes: Negative for other worsening pain, redness or visual disturbance.  Respiratory: Negative for choking or stridor Cardiovascular: Negative for other chest pain and palpitations.  Gastrointestinal: Negative for worsening diarrhea, blood in stool, or abdominal distention Genitourinary: Negative for hematuria, flank pain or change in urine volume.  Musculoskeletal: Negative for myalgias or other joint complaints.  Skin: Negative for other color change and wound or  drainage.  Neurological: Negative for syncope and numbness. other than noted Hematological: Negative for adenopathy. or other swelling Psychiatric/Behavioral: Negative for hallucinations, SI, self-injury, decreased concentration or other worsening agitation.      Objective:   Physical Exam BP 124/78   Pulse 61   Temp 98.7 F (37.1 C) (Oral)   Resp 20   Wt 235 lb (106.6 kg)   SpO2 98%   BMI 31.87 kg/m  VS noted, obese Constitutional: Pt is oriented to person, place, and time. Appears well-developed and well-nourished, in no significant distress Head: Normocephalic and atraumatic  Eyes: Conjunctivae and EOM are normal. Pupils are equal, round, and reactive to light Right Ear: External ear normal.  Left Ear: External ear normal Nose: Nose  normal.  Mouth/Throat: Oropharynx is clear and moist  Neck: Normal range of motion. Neck supple. No JVD present. No tracheal deviation present or significant neck LA or mass Cardiovascular: Normal rate, regular rhythm, normal heart sounds and intact distal pulses.   Pulmonary/Chest: Effort normal and breath sounds without rales or wheezing  Abdominal: Soft. Bowel sounds are normal. NT. No HSM  Musculoskeletal: Normal range of motion. Exhibits no edema Lymphadenopathy: Has no cervical adenopathy.  Neurological: Pt is alert and oriented to person, place, and time. Pt has normal reflexes. No cranial nerve deficit. Motor grossly intact Skin: Skin is warm and dry. No rash noted or new ulcers Psychiatric:  Has normal mood and affect. Behavior is normal.      Assessment & Plan:

## 2015-11-07 NOTE — Assessment & Plan Note (Signed)
stable overall by history and exam, recent data reviewed with pt, and pt to continue medical treatment as before,  to f/u any worsening symptoms or concerns BP Readings from Last 3 Encounters:  11/07/15 124/78  05/10/15 134/70  01/11/15 (!) 144/76

## 2015-11-07 NOTE — Assessment & Plan Note (Signed)
With some nocturia, declines urology referral

## 2015-11-14 DIAGNOSIS — M1712 Unilateral primary osteoarthritis, left knee: Secondary | ICD-10-CM | POA: Diagnosis not present

## 2015-11-14 DIAGNOSIS — M25562 Pain in left knee: Secondary | ICD-10-CM | POA: Diagnosis not present

## 2015-11-19 DIAGNOSIS — M25562 Pain in left knee: Secondary | ICD-10-CM | POA: Diagnosis not present

## 2015-11-19 DIAGNOSIS — M1712 Unilateral primary osteoarthritis, left knee: Secondary | ICD-10-CM | POA: Diagnosis not present

## 2015-11-20 ENCOUNTER — Telehealth: Payer: Self-pay | Admitting: Internal Medicine

## 2015-11-20 MED ORDER — METFORMIN HCL ER 500 MG PO TB24: ORAL_TABLET | ORAL | 3 refills | Status: DC

## 2015-11-20 NOTE — Telephone Encounter (Signed)
Pt stating [employer] has 2 different rx for metFORMIN (GLUCOPHAGE-XR) 500 MG 24 hr . They need to know which one to fill since its diff direction. Please help

## 2015-11-20 NOTE — Telephone Encounter (Signed)
rx is corrected

## 2015-11-22 DIAGNOSIS — L219 Seborrheic dermatitis, unspecified: Secondary | ICD-10-CM | POA: Diagnosis not present

## 2015-11-22 DIAGNOSIS — L821 Other seborrheic keratosis: Secondary | ICD-10-CM | POA: Diagnosis not present

## 2015-11-22 DIAGNOSIS — D224 Melanocytic nevi of scalp and neck: Secondary | ICD-10-CM | POA: Diagnosis not present

## 2015-11-22 DIAGNOSIS — Z85828 Personal history of other malignant neoplasm of skin: Secondary | ICD-10-CM | POA: Diagnosis not present

## 2015-11-22 DIAGNOSIS — B351 Tinea unguium: Secondary | ICD-10-CM | POA: Diagnosis not present

## 2015-11-26 DIAGNOSIS — M1712 Unilateral primary osteoarthritis, left knee: Secondary | ICD-10-CM | POA: Diagnosis not present

## 2015-11-26 DIAGNOSIS — M25562 Pain in left knee: Secondary | ICD-10-CM | POA: Diagnosis not present

## 2016-02-14 ENCOUNTER — Other Ambulatory Visit: Payer: Self-pay | Admitting: *Deleted

## 2016-02-14 MED ORDER — LOSARTAN POTASSIUM 100 MG PO TABS
100.0000 mg | ORAL_TABLET | Freq: Every day | ORAL | 1 refills | Status: DC
Start: 2016-02-14 — End: 2016-04-23

## 2016-02-23 ENCOUNTER — Ambulatory Visit (INDEPENDENT_AMBULATORY_CARE_PROVIDER_SITE_OTHER): Payer: Medicare Other | Admitting: Family Medicine

## 2016-02-23 ENCOUNTER — Encounter: Payer: Self-pay | Admitting: Family Medicine

## 2016-02-23 DIAGNOSIS — R059 Cough, unspecified: Secondary | ICD-10-CM

## 2016-02-23 DIAGNOSIS — R05 Cough: Secondary | ICD-10-CM | POA: Diagnosis not present

## 2016-02-23 NOTE — Assessment & Plan Note (Signed)
New acute problem. Likely viral.  OTC Tussin.

## 2016-02-23 NOTE — Progress Notes (Signed)
   Subjective:  Patient ID: Nathan Russo, male    DOB: Feb 16, 1941  Age: 75 y.o. MRN: [id number]  CC: Cough  HPI:  74 year old male presents with complaints of cough.  Patient states that Wednesday he developed congestion and coughing. Cough is mildly productive. Mild in severity. Sputum is not discolored. No associated fevers or chills. No shortness of breath. No known exacerbating or relieving factors. No other complaints at this time.  Social Hx   Social History   Social History  . Marital status: Married    Spouse name: N/A  . Number of children: 3  . Years of education: N/A   Occupational History  . former Event organiser  Retired   Social History Main Topics  . Smoking status: Former Research scientist (life sciences)  . Smokeless tobacco: Never Used  . Alcohol use No  . Drug use: No  . Sexual activity: Not Asked   Other Topics Concern  . None   Social History Narrative  . None    Review of Systems  Constitutional: Negative.   HENT: Positive for congestion.   Respiratory: Positive for cough.    Objective:  BP 120/70   Pulse 66   Temp 98.6 F (37 C) (Oral)   Resp 17   Ht 6' (1.829 m)   Wt 229 lb 8 oz (104.1 kg)   SpO2 96%   BMI 31.13 kg/m   BP/Weight 02/23/2016 09/03/9483 06/13/2701  Systolic BP 500 938 182  Diastolic BP 70 78 70  Wt. (Lbs) 229.5 235 249.25  BMI 31.13 31.87 33.8   Physical Exam  Constitutional: He is oriented to person, place, and time. He appears well-developed. No distress.  Cardiovascular: Normal rate and regular rhythm.   Pulmonary/Chest: Effort normal and breath sounds normal.  Neurological: He is alert and oriented to person, place, and time.  Psychiatric: He has a normal mood and affect.  Vitals reviewed.  Lab Results  Component Value Date   WBC 8.5 11/07/2015   HGB 15.4 11/07/2015   HCT 44.7 11/07/2015   PLT 322.0 11/07/2015   GLUCOSE 140 (H) 11/07/2015   CHOL 189 11/07/2015   TRIG 278.0 (H) 11/07/2015   HDL 32.90 (L)  11/07/2015   LDLDIRECT 113.0 11/07/2015   LDLCALC 119 (H) 03/22/2013   ALT 27 11/07/2015   AST 23 11/07/2015   NA 141 11/07/2015   K 4.3 11/07/2015   CL 105 11/07/2015   CREATININE 1.33 11/07/2015   BUN 24 (H) 11/07/2015   CO2 27 11/07/2015   TSH 1.51 11/07/2015   PSA 3.17 11/07/2015   INR 1.0 08/22/2008   HGBA1C 7.6 (H) 11/07/2015   MICROALBUR <0.7 11/07/2015   Assessment & Plan:   Problem List Items Addressed This Visit    Cough    New acute problem. Likely viral.  OTC Tussin.        Follow-up: PRN  [place]

## 2016-02-23 NOTE — Patient Instructions (Signed)
OTC Tussin.  This is likely viral.   Call with concerns.  Take care  Dr. Lacinda Axon

## 2016-02-23 NOTE — Progress Notes (Signed)
Pre-visit discussion using our clinic review tool. No additional management support is needed unless otherwise documented below in the visit note.  

## 2016-02-29 ENCOUNTER — Encounter: Payer: Self-pay | Admitting: Internal Medicine

## 2016-02-29 ENCOUNTER — Ambulatory Visit (INDEPENDENT_AMBULATORY_CARE_PROVIDER_SITE_OTHER): Payer: Medicare Other | Admitting: Internal Medicine

## 2016-02-29 VITALS — BP 130/70 | HR 72 | Temp 98.8°F | Resp 20 | Wt 235.0 lb

## 2016-02-29 DIAGNOSIS — M1710 Unilateral primary osteoarthritis, unspecified knee: Secondary | ICD-10-CM

## 2016-02-29 DIAGNOSIS — R05 Cough: Secondary | ICD-10-CM

## 2016-02-29 DIAGNOSIS — R059 Cough, unspecified: Secondary | ICD-10-CM

## 2016-02-29 DIAGNOSIS — IMO0002 Reserved for concepts with insufficient information to code with codable children: Secondary | ICD-10-CM

## 2016-02-29 DIAGNOSIS — M79604 Pain in right leg: Secondary | ICD-10-CM | POA: Insufficient documentation

## 2016-02-29 MED ORDER — AZITHROMYCIN 250 MG PO TABS: ORAL_TABLET | ORAL | 1 refills | Status: DC

## 2016-02-29 MED ORDER — DICLOFENAC SODIUM 1 % TD GEL: 4.0000 g | Freq: Four times a day (QID) | TRANSDERMAL | 11 refills | Status: DC | PRN

## 2016-02-29 NOTE — Assessment & Plan Note (Signed)
C/w subq hematoma due to trauma on eliquis, mild pain only, dw pt, declines surgical drainage, tylenol prn,  to f/u any worsening symptoms or concerns

## 2016-02-29 NOTE — Assessment & Plan Note (Signed)
Severe left knee, for volt gel prn, declines any oral meds,  to f/u any worsening symptoms or concerns

## 2016-02-29 NOTE — Assessment & Plan Note (Signed)
C/w upper resp infection, Mild to mod, for antibx course,  to f/u any worsening symptoms or concerns

## 2016-02-29 NOTE — Progress Notes (Signed)
Pre visit review using our clinic review tool, if applicable. No additional management support is needed unless otherwise documented below in the visit note. 

## 2016-02-29 NOTE — Progress Notes (Signed)
Subjective:    Patient ID: Nathan Russo, male    DOB: November 23, 1940, 75 y.o.   MRN: [id number]  HPI  Here to f/u with c/o large tender ? bruising area to mid right leg lateral pretibial after a minor bump of leg on a hard pail while washing a vehicle.  Has mild pain, limps to walk, but declines need for pain control, area seemed to enlarge last PM but not much worse this am, though tender to touch.  No skin ulcer or drainage. No fever,  Also,  Here with 2-3 days acute onset fever, facial pain, pressure, headache, general weakness and malaise, and greenish d/c, with mild ST and cough, but pt denies chest pain, wheezing, increased sob or doe, orthopnea, PND, increased LE swelling, palpitations, dizziness or syncope.  Has also persistent left knee pain, intermittent swelling to limp some days to the left leg.  Trying to avoid surgury so far for knee replacement Past Medical History:  Diagnosis Date  . Abdominal pain, epigastric 04/11/2010  . BELCHING 04/23/2010  . BRADYCARDIA, CHRONIC 10/24/2006  . CARPAL TUNNEL SYNDROME, BILATERAL 02/07/2009  . CHEST PAIN-UNSPECIFIED 09/05/2008  . CHOLELITHIASIS 04/22/2010  . Cholelithiasis 06/13/2010  . COLONIC POLYPS, HX OF 04/28/2007  . DEGENERATIVE JOINT DISEASE, RIGHT KNEE 10/24/2006  . Depression 02/23/2011  . DIABETES MELLITUS, TYPE II 04/28/2007  . Dizziness and giddiness 12/19/2009  . Elevated PSA 06/13/2010  . FATIGUE 04/28/2007  . GERD 02/11/2009  . Headache(784.0) 12/19/2009  . HYPERLIPIDEMIA 04/28/2007  . HYPERTENSION 10/21/2006  . NECK MASS 02/07/2010  . OBESITY 10/24/2006  . OTITIS MEDIA, ACUTE, LEFT 12/26/2009  . PHIMOSIS 02/07/2009  . S/P laparoscopic cholecystectomy 10/31/2010  . Thyroid cancer ([place]) 06/13/2010  . THYROID NODULE 02/07/2010   Past Surgical History:  Procedure Laterality Date  . CHOLECYSTECTOMY    . left knee surgery    . right wrist surgury    . THYROID SURGERY      reports that he has quit smoking. He has never used smokeless tobacco.  He reports that he does not drink alcohol or use drugs. family history includes Arthritis in his mother; Cancer in his brother and sister; Colon cancer (age of onset: 101) in his brother; Diabetes in his brother; Goiter in his mother; Heart attack (age of onset: 56) in his brother; Heart attack (age of onset: 73) in his father; Hypertension in his mother; Hypothyroidism in his sister. Allergies  Allergen Reactions  . Ace Inhibitors     REACTION: cough  . Atenolol     REACTION: bradycardia  . Codeine Other (See Comments)    Head spins "wild"  . Lovastatin     REACTION: myalygros  . Morphine Nausea And Vomiting  . Sulfa Antibiotics Other (See Comments)    As child almost died  . Tramadol     Ants crawling all over   Current Outpatient Prescriptions on File Prior to Visit  Medication Sig Dispense Refill  . amLODipine (NORVASC) 5 MG tablet Take 0.5 tablets (2.5 mg total) by mouth daily. 45 tablet 3  . apixaban (ELIQUIS) 5 MG TABS tablet Take by mouth 2 (two) times daily.    . Ascorbic Acid (VITAMIN C) 1000 MG tablet Take 1,000 mg by mouth daily.      . blood glucose meter kit and supplies KIT Dispense based on patient and insurance preference. Use up to four times daily as directed. (FOR ICD-9 250.00, 250.01). 1 each 0  . Calcium 500-125 MG-UNIT TABS 1 tablet  daily.    . calcium carbonate (OS-CAL) 600 MG TABS Take 600 mg by mouth daily.      . Cinnamon 500 MG capsule Take 500 mg by mouth 2 (two) times daily.      [name] diltiazem (CARDIZEM CD) 120 MG 24 hr capsule Take 1 capsule (120 mg total) by mouth 2 (two) times daily. 180 capsule 3  . Flaxseed, Linseed, 1000 MG CAPS Take 1 capsule by mouth daily.    . flecainide (TAMBOCOR) 100 MG tablet Take 100 mg by mouth 2 (two) times daily.    . furosemide (LASIX) 40 MG tablet 1 by mouth per day as needed 30 tablet 3  . Garlic Oil (ODORLESS GARLIC) 329 MG TABS Take by mouth 2 (two) times daily.      . Ginger, Zingiber officinalis, (GINGER PO) Take by  mouth 4 (four) times daily.      . Glucosamine-Chondroit-Vit C-Mn (GLUCOSAMINE CHONDROITIN COMPLX) CAPS Take by mouth daily.      [name] glucose blood test strip Use as instructed 100 each 12  . levothyroxine (SYNTHROID, LEVOTHROID) 125 MCG tablet TAKE ONE TABLET BY MOUTH ONCE DAILY BEFORE  BREAKFAST 90 tablet 3  . losartan (COZAAR) 100 MG tablet Take 1 tablet (100 mg total) by mouth daily. 90 tablet 1  . metFORMIN (GLUCOPHAGE-XR) 500 MG 24 hr tablet 4 tabs by mouth per day 360 tablet 3  . Misc Natural Products (OSTEO BI-FLEX TRIPLE STRENGTH) TABS Take 1 tablet by mouth.    . Multiple Vitamin (MULTIVITAMIN) capsule Take 1 capsule by mouth daily.      . Omega-3 Fatty Acids (FISH OIL) 1000 MG CAPS Take by mouth 2 (two) times daily.      [name] omeprazole (PRILOSEC) 20 MG capsule Take 1 capsule (20 mg total) by mouth 2 (two) times daily. 180 capsule 3  . pravastatin (PRAVACHOL) 20 MG tablet Take 1 tablet (20 mg total) by mouth daily. 90 tablet 3  . triamcinolone (KENALOG) 0.025 % cream Apply 1 application topically 2 (two) times daily.    . Triamcinolone Acetonide (TRIAMCINOLONE 0.1 % CREAM : EUCERIN) CREA Apply 1 application topically.    [name] VITAMIN D, CHOLECALCIFEROL, PO Take by mouth daily.       No current facility-administered medications on file prior to visit.    Review of Systems  Constitutional: Negative for unusual diaphoresis or night sweats HENT: Negative for ear swelling or discharge Eyes: Negative for worsening visual haziness  Respiratory: Negative for choking and stridor.   Gastrointestinal: Negative for distension or worsening eructation Genitourinary: Negative for retention or change in urine volume.  Musculoskeletal: Negative for other MSK pain or swelling Skin: Negative for color change and worsening wound Neurological: Negative for tremors and numbness other than noted  Psychiatric/Behavioral: Negative for decreased concentration or agitation other than above   All other system neg  per pt    Objective:   Physical Exam BP 130/70   Pulse 72   Temp 98.8 F (37.1 C) (Oral)   Resp 20   Wt 235 lb (106.6 kg)   SpO2 96%   BMI 31.87 kg/m  VS noted, mil dill Constitutional: Pt appears in no apparent distress HENT: Head: NCAT.  Right Ear: External ear normal.  Left Ear: External ear normal.  Eyes: . Pupils are equal, round, and reactive to light. Conjunctivae and EOM are normal Bilat tm's with mild erythema.  Max sinus areas mild tender.  Pharynx with mild erythema, no exudate Neck: Normal range of  motion. Neck supple.  Cardiovascular: Normal rate and regular rhythm.   Pulmonary/Chest: Effort normal and breath sounds without rales or wheezing.  Left knee with severe bony deg changes, small effusion Right mid leg lateral pretibial with 2 cm area subq mild tender hematoma with mild overlying bruising, without other skin wound or erythema  Neurological: Pt is alert. Not confused , motor grossly intact Skin: Skin is warm. No rash, no LE edema Psychiatric: Pt behavior is normal. No agitation.  No other new exam findings    Assessment & Plan:

## 2016-02-29 NOTE — Patient Instructions (Signed)
Please take all new medication as prescribed - the antibiotic, and the gel pain medication for the knee  Please continue all other medications as before, and refills have been done if requested.  Please have the pharmacy call with any other refills you may need.  Please keep your appointments with your specialists as you may have planned

## 2016-03-04 ENCOUNTER — Ambulatory Visit (INDEPENDENT_AMBULATORY_CARE_PROVIDER_SITE_OTHER): Payer: Medicare Other | Admitting: Internal Medicine

## 2016-03-04 ENCOUNTER — Encounter: Payer: Self-pay | Admitting: Internal Medicine

## 2016-03-04 DIAGNOSIS — E119 Type 2 diabetes mellitus without complications: Secondary | ICD-10-CM | POA: Diagnosis not present

## 2016-03-04 DIAGNOSIS — I10 Essential (primary) hypertension: Secondary | ICD-10-CM | POA: Diagnosis not present

## 2016-03-04 DIAGNOSIS — L02419 Cutaneous abscess of limb, unspecified: Secondary | ICD-10-CM

## 2016-03-04 MED ORDER — DOXYCYCLINE HYCLATE 100 MG PO TABS: 100.0000 mg | ORAL_TABLET | Freq: Two times a day (BID) | ORAL | 0 refills | Status: DC

## 2016-03-04 MED ORDER — DOXYCYCLINE HYCLATE 100 MG PO TABS
100.0000 mg | ORAL_TABLET | Freq: Two times a day (BID) | ORAL | 0 refills | Status: DC
Start: 2016-03-04 — End: 2016-03-04

## 2016-03-04 MED ORDER — HYDROCODONE-ACETAMINOPHEN 5-325 MG PO TABS: 1.0000 | ORAL_TABLET | Freq: Four times a day (QID) | ORAL | 0 refills | Status: DC | PRN

## 2016-03-04 NOTE — Assessment & Plan Note (Signed)
stable overall by history and exam, recent data reviewed with pt, and pt to continue medical treatment as before,  to f/u any worsening symptoms or concerns Lab Results  Component Value Date   HGBA1C 7.6 (H) 11/07/2015

## 2016-03-04 NOTE — Patient Instructions (Signed)
Please take all new medication as prescribed - the new antibiotic , and pain medication if needed  /Please continue all other medications as before, and refills have been done if requested.  Please have the pharmacy call with any other refills you may need.  Please continue your efforts at being more active, low cholesterol diet, and weight control.  Please keep your appointments with your specialists as you may have planned  You will be contacted regarding the referral for: General Surgury - see PCC's now

## 2016-03-04 NOTE — Assessment & Plan Note (Signed)
Clinical dx, for doxy course, hold on imaging for now, for pain control, and refer gen surgury asap

## 2016-03-04 NOTE — Progress Notes (Signed)
Subjective:    Patient ID: Nathan Russo, male    DOB: 1941-02-02, 75 y.o.   MRN: [id number]  HPI    Here to f/u, unfortunately recent area off hematoma appears to be worsening with pain/swelling/redness without red streaks or drainage in the last 3 days.  No high fever, but now worsening limp to walk.  Pt denies chest pain, increased sob or doe, wheezing, orthopnea, PND, increased LE swelling, palpitations, dizziness or syncope.  Pt denies new neurological symptoms such as new headache, or facial or extremity weakness or numbness   Pt denies polydipsia, polyuria.  Currently on cephalexin for URI symptoms now improved, but with lingering nonprod cough. Past Medical History:  Diagnosis Date  . Abdominal pain, epigastric 04/11/2010  . BELCHING 04/23/2010  . BRADYCARDIA, CHRONIC 10/24/2006  . CARPAL TUNNEL SYNDROME, BILATERAL 02/07/2009  . CHEST PAIN-UNSPECIFIED 09/05/2008  . CHOLELITHIASIS 04/22/2010  . Cholelithiasis 06/13/2010  . COLONIC POLYPS, HX OF 04/28/2007  . DEGENERATIVE JOINT DISEASE, RIGHT KNEE 10/24/2006  . Depression 02/23/2011  . DIABETES MELLITUS, TYPE II 04/28/2007  . Dizziness and giddiness 12/19/2009  . Elevated PSA 06/13/2010  . FATIGUE 04/28/2007  . GERD 02/11/2009  . Headache(784.0) 12/19/2009  . HYPERLIPIDEMIA 04/28/2007  . HYPERTENSION 10/21/2006  . NECK MASS 02/07/2010  . OBESITY 10/24/2006  . OTITIS MEDIA, ACUTE, LEFT 12/26/2009  . PHIMOSIS 02/07/2009  . S/P laparoscopic cholecystectomy 10/31/2010  . Thyroid cancer ([place]) 06/13/2010  . THYROID NODULE 02/07/2010   Past Surgical History:  Procedure Laterality Date  . CHOLECYSTECTOMY    . left knee surgery    . right wrist surgury    . THYROID SURGERY      reports that he has quit smoking. He has never used smokeless tobacco. He reports that he does not drink alcohol or use drugs. family history includes Arthritis in his mother; Cancer in his brother and sister; Colon cancer (age of onset: 42) in his brother; Diabetes in his  brother; Goiter in his mother; Heart attack (age of onset: 45) in his brother; Heart attack (age of onset: 39) in his father; Hypertension in his mother; Hypothyroidism in his sister. Allergies  Allergen Reactions  . Ace Inhibitors     REACTION: cough  . Atenolol     REACTION: bradycardia  . Codeine Other (See Comments)    Head spins "wild"  . Lovastatin     REACTION: myalygros  . Morphine Nausea And Vomiting  . Sulfa Antibiotics Other (See Comments)    As child almost died  . Tramadol     Ants crawling all over   Review of Systems  Constitutional: Negative for unusual diaphoresis or night sweats HENT: Negative for ear swelling or discharge Eyes: Negative for worsening visual haziness  Respiratory: Negative for choking and stridor.   Gastrointestinal: Negative for distension or worsening eructation Genitourinary: Negative for retention or change in urine volume.  Musculoskeletal: Negative for other MSK pain or swelling Skin: Negative for color change and worsening wound Neurological: Negative for tremors and numbness other than noted  Psychiatric/Behavioral: Negative for decreased concentration or agitation other than above   All other system neg per pt    Objective:   Physical Exam BP 130/70   Pulse 63   Temp 98 F (36.7 C) (Oral)   Resp 20   Wt 229 lb (103.9 kg)   SpO2 98%   BMI 31.06 kg/m  VS noted,  Constitutional: Pt appears in no apparent distress HENT: Head: NCAT.  Right Ear:  External ear normal.  Left Ear: External ear normal.  Eyes: . Pupils are equal, round, and reactive to light. Conjunctivae and EOM are normal Neck: Normal range of motion. Neck supple.  Cardiovascular: Normal rate and regular rhythm.   Pulmonary/Chest: Effort normal and breath sounds without rales or wheezing.  Right mid leg with worsening now 4-5 cm area probable fluctuand right lateral mid leg without drainage, red streaks but with swelling now trace to 1+ distal LE; dorsalis pedis  puls 1+ Neurological: Pt is alert. Not confused , motor grossly intact Skin: Skin is warm. No rash, no LE edema Psychiatric: Pt behavior is normal. No agitation.  No other new exam findings    Assessment & Plan:

## 2016-03-04 NOTE — Progress Notes (Signed)
Pre visit review using our clinic review tool, if applicable. No additional management support is needed unless otherwise documented below in the visit note. 

## 2016-03-04 NOTE — Assessment & Plan Note (Signed)
stable overall by history and exam, recent data reviewed with pt, and pt to continue medical treatment as before,  to f/u any worsening symptoms or concerns BP Readings from Last 3 Encounters:  03/04/16 130/70  02/29/16 130/70  02/23/16 120/70

## 2016-03-05 DIAGNOSIS — S8011XA Contusion of right lower leg, initial encounter: Secondary | ICD-10-CM | POA: Diagnosis not present

## 2016-03-11 DIAGNOSIS — S8011XA Contusion of right lower leg, initial encounter: Secondary | ICD-10-CM | POA: Diagnosis not present

## 2016-03-12 DIAGNOSIS — I10 Essential (primary) hypertension: Secondary | ICD-10-CM | POA: Diagnosis not present

## 2016-03-12 DIAGNOSIS — E119 Type 2 diabetes mellitus without complications: Secondary | ICD-10-CM | POA: Diagnosis not present

## 2016-03-12 DIAGNOSIS — I471 Supraventricular tachycardia: Secondary | ICD-10-CM | POA: Diagnosis not present

## 2016-03-12 DIAGNOSIS — I480 Paroxysmal atrial fibrillation: Secondary | ICD-10-CM | POA: Diagnosis not present

## 2016-03-14 DIAGNOSIS — I480 Paroxysmal atrial fibrillation: Secondary | ICD-10-CM | POA: Diagnosis not present

## 2016-04-08 ENCOUNTER — Other Ambulatory Visit: Payer: Self-pay | Admitting: Internal Medicine

## 2016-04-23 ENCOUNTER — Telehealth: Payer: Self-pay | Admitting: *Deleted

## 2016-04-23 MED ORDER — LOSARTAN POTASSIUM 100 MG PO TABS
100.0000 mg | ORAL_TABLET | Freq: Every day | ORAL | 1 refills | Status: DC
Start: 2016-04-23 — End: 2017-05-08

## 2016-04-23 NOTE — Telephone Encounter (Signed)
Rec'd call pt states he can get his Losartan cheaper @ [facility]. Requesting rx to be sent. Sent electronically...Johny Chess

## 2016-04-29 DIAGNOSIS — R972 Elevated prostate specific antigen [PSA]: Secondary | ICD-10-CM | POA: Diagnosis not present

## 2016-04-29 DIAGNOSIS — N401 Enlarged prostate with lower urinary tract symptoms: Secondary | ICD-10-CM | POA: Diagnosis not present

## 2016-04-29 DIAGNOSIS — R3915 Urgency of urination: Secondary | ICD-10-CM | POA: Diagnosis not present

## 2016-05-06 ENCOUNTER — Encounter: Payer: Self-pay | Admitting: Internal Medicine

## 2016-05-06 ENCOUNTER — Ambulatory Visit (INDEPENDENT_AMBULATORY_CARE_PROVIDER_SITE_OTHER): Payer: Medicare Other | Admitting: Internal Medicine

## 2016-05-06 ENCOUNTER — Other Ambulatory Visit (INDEPENDENT_AMBULATORY_CARE_PROVIDER_SITE_OTHER): Payer: Medicare Other

## 2016-05-06 VITALS — BP 128/72 | HR 82 | Temp 97.7°F | Ht 72.0 in | Wt 246.0 lb

## 2016-05-06 DIAGNOSIS — I10 Essential (primary) hypertension: Secondary | ICD-10-CM

## 2016-05-06 DIAGNOSIS — E785 Hyperlipidemia, unspecified: Secondary | ICD-10-CM | POA: Diagnosis not present

## 2016-05-06 DIAGNOSIS — E119 Type 2 diabetes mellitus without complications: Secondary | ICD-10-CM

## 2016-05-06 LAB — HEPATIC FUNCTION PANEL
ALT: 27 U/L (ref 0–53)
AST: 20 U/L (ref 0–37)
Albumin: 4.4 g/dL (ref 3.5–5.2)
Alkaline Phosphatase: 60 U/L (ref 39–117)
BILIRUBIN DIRECT: 0.2 mg/dL (ref 0.0–0.3)
BILIRUBIN TOTAL: 0.8 mg/dL (ref 0.2–1.2)
TOTAL PROTEIN: 7.2 g/dL (ref 6.0–8.3)

## 2016-05-06 LAB — BASIC METABOLIC PANEL
BUN: 18 mg/dL (ref 6–23)
CO2: 27 mEq/L (ref 19–32)
CREATININE: 1.32 mg/dL (ref 0.40–1.50)
Calcium: 10.1 mg/dL (ref 8.4–10.5)
Chloride: 104 mEq/L (ref 96–112)
GFR: 55.79 mL/min — AB (ref >60.00)
Glucose, Bld: 183 mg/dL — ABNORMAL HIGH (ref 70–99)
POTASSIUM: 4.3 meq/L (ref 3.5–5.1)
Sodium: 140 mEq/L (ref 135–145)

## 2016-05-06 LAB — HEMOGLOBIN A1C: HEMOGLOBIN A1C: 7.9 % — AB (ref 4.6–6.5)

## 2016-05-06 LAB — LIPID PANEL
CHOLESTEROL: 188 mg/dL (ref 0–200)
HDL: 38 mg/dL — ABNORMAL LOW (ref >39.00)
LDL CALC: 112 mg/dL — AB (ref 0–99)
NonHDL: 149.92
TRIGLYCERIDES: 189 mg/dL — AB (ref 0.0–149.0)
Total CHOL/HDL Ratio: 5
VLDL: 37.8 mg/dL (ref 0.0–40.0)

## 2016-05-06 MED ORDER — EZETIMIBE 10 MG PO TABS: 10.0000 mg | ORAL_TABLET | Freq: Every day | ORAL | 3 refills | Status: DC

## 2016-05-06 NOTE — Assessment & Plan Note (Signed)
stable overall by history and exam, recent data reviewed with pt statin intolerant,  to f/u any worsening symptoms or concerns, so start zetia 10 qd Lab Results  Component Value Date   LDLCALC 119 (H) 03/22/2013

## 2016-05-06 NOTE — Assessment & Plan Note (Addendum)
Lab Results  Component Value Date   HGBA1C 7.6 (H) 11/07/2015   For f/u lab on increased metformin, o/w stable overall by history and exam, recent data reviewed with pt, and pt to continue medical treatment as before,  to f/u any worsening symptoms or concerns

## 2016-05-06 NOTE — Patient Instructions (Addendum)
Please take all new medication as prescribed - the zetia 10 mg per dayu  Please continue all other medications as before, and refills have been done if requested.  Please have the pharmacy call with any other refills you may need.  Please continue your efforts at being more active, low cholesterol diet, and weight control.  You are otherwise up to date with prevention measures today.  Please keep your appointments with your specialists as you may have planned  You will be contacted regarding the referral for: podiatry  Please remember to call for you eye doctor appt  Please go to the LAB in the Basement (turn left off the elevator) for the tests to be done today  You will be contacted by phone if any changes need to be made immediately.  Otherwise, you will receive a letter about your results with an explanation, but please check with MyChart first.  Please remember to sign up for MyChart if you have not done so, as this will be important to you in the future with finding out test results, communicating by private email, and scheduling acute appointments online when needed.  If you have Medicare related insurance (such as traditoinal Medicare, [employer] or [employer], or similar), Please make an appointment at the Scheduling desk with Sharee Pimple, the [employer], for your Wellness Visit in this office, which is a benefit with your insurance.  Please return in 6 months, or sooner if needed

## 2016-05-06 NOTE — Progress Notes (Signed)
Subjective:    Patient ID: Nathan Russo, male    DOB: 09/28/1940, 76 y.o.   MRN: [id number]  HPI  Here to f/u; overall doing ok,  Pt denies chest pain, increasing sob or doe, wheezing, orthopnea, PND, increased LE swelling, palpitations, dizziness or syncope.  Pt denies new neurological symptoms such as new headache, or facial or extremity weakness or numbness.  Pt denies polydipsia, polyuria, or low sugar episode.   Pt denies new neurological symptoms such as new headache, or facial or extremity weakness or numbness.   Pt states overall good compliance with meds, mostly trying to follow appropriate diet, with wt overall stable,  but little exercise however, due to chronic left knee but plans to try to do more non wt bearing exercise. . Has persistent bilat ear ithing, a friend told him his derm recommended otc 2.5 hydrocortisone sparingly and oragel  Denies urinary symptoms such as dysuria, frequency, urgency, flank pain, hematuria or n/v, fever, chills.  Saw urology last wk with OK exam, f/u at 6 mo.  Dr Gaynelle Arabian retiring in June, will need new urologist. Has in fact gained wt due to knee issue, and has been putting off the left knee TKA due to fear of risk of surgury complications Past Medical History:  Diagnosis Date  . Abdominal pain, epigastric 04/11/2010  . BELCHING 04/23/2010  . BRADYCARDIA, CHRONIC 10/24/2006  . CARPAL TUNNEL SYNDROME, BILATERAL 02/07/2009  . CHEST PAIN-UNSPECIFIED 09/05/2008  . CHOLELITHIASIS 04/22/2010  . Cholelithiasis 06/13/2010  . COLONIC POLYPS, HX OF 04/28/2007  . DEGENERATIVE JOINT DISEASE, RIGHT KNEE 10/24/2006  . Depression 02/23/2011  . DIABETES MELLITUS, TYPE II 04/28/2007  . Dizziness and giddiness 12/19/2009  . Elevated PSA 06/13/2010  . FATIGUE 04/28/2007  . GERD 02/11/2009  . Headache(784.0) 12/19/2009  . HYPERLIPIDEMIA 04/28/2007  . HYPERTENSION 10/21/2006  . NECK MASS 02/07/2010  . OBESITY 10/24/2006  . OTITIS MEDIA, ACUTE, LEFT 12/26/2009  . PHIMOSIS  02/07/2009  . S/P laparoscopic cholecystectomy 10/31/2010  . Thyroid cancer ([place]) 06/13/2010  . THYROID NODULE 02/07/2010   Past Surgical History:  Procedure Laterality Date  . CHOLECYSTECTOMY    . left knee surgery    . right wrist surgury    . THYROID SURGERY      reports that he has quit smoking. He has never used smokeless tobacco. He reports that he does not drink alcohol or use drugs. family history includes Arthritis in his mother; Cancer in his brother and sister; Colon cancer (age of onset: 51) in his brother; Diabetes in his brother; Goiter in his mother; Heart attack (age of onset: 33) in his brother; Heart attack (age of onset: 67) in his father; Hypertension in his mother; Hypothyroidism in his sister. Allergies  Allergen Reactions  . Ace Inhibitors     REACTION: cough  . Atenolol     REACTION: bradycardia  . Codeine Other (See Comments)    Head spins "wild"  . Lovastatin     REACTION: myalygros  . Morphine Nausea And Vomiting  . Morphine And Related   . Statins   . Sulfa Antibiotics Other (See Comments)    As child almost died  . Sulfamethoxazole Other (See Comments)    Childhood unknown reaction  . Tramadol     Ants crawling all over   Current Outpatient Prescriptions on File Prior to Visit  Medication Sig Dispense Refill  . amLODipine (NORVASC) 5 MG tablet Take 0.5 tablets (2.5 mg total) by mouth daily. 45 tablet 3  .  apixaban (ELIQUIS) 5 MG TABS tablet Take by mouth 2 (two) times daily.    . Ascorbic Acid (VITAMIN C) 1000 MG tablet Take 1,000 mg by mouth daily.      . blood glucose meter kit and supplies KIT Dispense based on patient and insurance preference. Use up to four times daily as directed. (FOR ICD-9 250.00, 250.01). 1 each 0  . Calcium 500-125 MG-UNIT TABS 1 tablet daily.    . calcium carbonate (OS-CAL) 600 MG TABS Take 600 mg by mouth daily.      . Cinnamon 500 MG capsule Take 500 mg by mouth 2 (two) times daily.      . diclofenac sodium (VOLTAREN) 1  % GEL Apply 4 g topically 4 (four) times daily as needed. 400 g 11  . diltiazem (CARDIZEM CD) 120 MG 24 hr capsule Take 1 capsule (120 mg total) by mouth 2 (two) times daily. 180 capsule 3  . Flaxseed, Linseed, 1000 MG CAPS Take 1 capsule by mouth daily.    . flecainide (TAMBOCOR) 100 MG tablet Take 100 mg by mouth 2 (two) times daily.    . Garlic Oil (ODORLESS GARLIC) 762 MG TABS Take by mouth 2 (two) times daily.      . Ginger, Zingiber officinalis, (GINGER PO) Take by mouth 4 (four) times daily.      . Glucosamine-Chondroit-Vit C-Mn (GLUCOSAMINE CHONDROITIN COMPLX) CAPS Take by mouth daily.      [name] glucose blood test strip Use as instructed 100 each 12  . levothyroxine (SYNTHROID, LEVOTHROID) 125 MCG tablet TAKE 1 TABLET ONE TIME DAILY BEFORE BREAKFAST 90 tablet 3  . losartan (COZAAR) 100 MG tablet Take 1 tablet (100 mg total) by mouth daily. 90 tablet 1  . metFORMIN (GLUCOPHAGE-XR) 500 MG 24 hr tablet 4 tabs by mouth per day 360 tablet 3  . Misc Natural Products (OSTEO BI-FLEX TRIPLE STRENGTH) TABS Take 1 tablet by mouth.    . Multiple Vitamin (MULTIVITAMIN) capsule Take 1 capsule by mouth daily.      . Omega-3 Fatty Acids (FISH OIL) 1000 MG CAPS Take by mouth 2 (two) times daily.      [name] omeprazole (PRILOSEC) 20 MG capsule Take 1 capsule (20 mg total) by mouth 2 (two) times daily. 180 capsule 3  . triamcinolone (KENALOG) 0.025 % cream Apply 1 application topically 2 (two) times daily.    . Triamcinolone Acetonide (TRIAMCINOLONE 0.1 % CREAM : EUCERIN) CREA Apply 1 application topically.    [name] VITAMIN D, CHOLECALCIFEROL, PO Take by mouth daily.       No current facility-administered medications on file prior to visit.    Review of Systems  Constitutional: Negative for unusual diaphoresis or night sweats HENT: Negative for ear swelling or discharge Eyes: Negative for worsening visual haziness  Respiratory: Negative for choking and stridor.   Gastrointestinal: Negative for distension or  worsening eructation Genitourinary: Negative for retention or change in urine volume.  Musculoskeletal: Negative for other MSK pain or swelling Skin: Negative for color change and worsening wound Neurological: Negative for tremors and numbness other than noted  Psychiatric/Behavioral: Negative for decreased concentration or agitation other than above   All other system neg per pt    Objective:   Physical Exam BP 128/72   Pulse 82   Temp 97.7 F (36.5 C)   Ht 6' (1.829 m)   Wt 246 lb (111.6 kg)   SpO2 98%   BMI 33.36 kg/m  VS noted,  Constitutional: Pt appears in  no apparent distress HENT: Head: NCAT.  Right Ear: External ear normal.  Left Ear: External ear normal.  Eyes: . Pupils are equal, round, and reactive to light. Conjunctivae and EOM are normal Neck: Normal range of motion. Neck supple.  Cardiovascular: Normal rate and regular rhythm.   Pulmonary/Chest: Effort normal and breath sounds without rales or wheezing.  Abd:  Soft, NT, ND, + BS Neurological: Pt is alert. Not confused , motor grossly intact Skin: Skin is warm. No rash, no LE edema Psychiatric: Pt behavior is normal. No agitation.  Severe left knee bony deg changes with varus       Assessment & Plan:

## 2016-05-06 NOTE — Assessment & Plan Note (Signed)
stable overall by history and exam, recent data reviewed with pt, and pt to continue medical treatment as before,  to f/u any worsening symptoms or concerns BP Readings from Last 3 Encounters:  05/06/16 128/72  03/04/16 130/70  02/29/16 130/70

## 2016-05-07 ENCOUNTER — Other Ambulatory Visit: Payer: Self-pay | Admitting: Internal Medicine

## 2016-05-07 MED ORDER — GLIPIZIDE ER 2.5 MG PO TB24: 2.5000 mg | ORAL_TABLET | Freq: Every day | ORAL | 3 refills | Status: DC

## 2016-05-20 NOTE — Progress Notes (Addendum)
Subjective:   Nathan Russo is a 76 y.o. male who presents for Medicare Annual/Subsequent preventive examination.  Review of Systems:  No ROS.  Medicare Wellness Visit.  Cardiac Risk Factors include: diabetes mellitus;dyslipidemia;male gender;advanced age (>42mn, >>17women);hypertension;family history of premature cardiovascular disease Sleep patterns: no sleep issues, feels rested on waking, gets up 1-2 times nightly to void and sleeps 8 hours nightly.   Home Safety/Smoke Alarms: Feels safe in home. Smoke alarms in place.    Living environment; residence and Firearm Safety: 1-story house/ trailer, no firearms. Lives with wife Seat Belt Safety/Bike Helmet: Wears seat belt.   Counseling:   Eye Exam- Last over 1 year, patient states he will make appointment Dental- every 78 years   Male:   CCS- Last 08/05/11, diverticulosis     PSA-  Lab Results  Component Value Date   PSA 3.17 11/07/2015   PSA 4.42 (H) 06/13/2010   PSA 1.91 05/17/2009        Objective:    Vitals: BP (!) 150/85   Pulse (!) 54   Resp 20   Ht 6' (1.829 m)   Wt 244 lb (110.7 kg)   SpO2 96%   BMI 33.09 kg/m   Body mass index is 33.09 kg/m.  Tobacco History  Smoking Status  . Former Smoker  Smokeless Tobacco  . Never Used     Counseling given: Not Answered   Past Medical History:  Diagnosis Date  . Abdominal pain, epigastric 04/11/2010  . BELCHING 04/23/2010  . BRADYCARDIA, CHRONIC 10/24/2006  . CARPAL TUNNEL SYNDROME, BILATERAL 02/07/2009  . CHEST PAIN-UNSPECIFIED 09/05/2008  . CHOLELITHIASIS 04/22/2010  . Cholelithiasis 06/13/2010  . COLONIC POLYPS, HX OF 04/28/2007  . DEGENERATIVE JOINT DISEASE, RIGHT KNEE 10/24/2006  . Depression 02/23/2011  . DIABETES MELLITUS, TYPE II 04/28/2007  . Dizziness and giddiness 12/19/2009  . Elevated PSA 06/13/2010  . FATIGUE 04/28/2007  . GERD 02/11/2009  . Headache(784.0) 12/19/2009  . HYPERLIPIDEMIA 04/28/2007  . HYPERTENSION 10/21/2006  . NECK MASS 02/07/2010  .  OBESITY 10/24/2006  . OTITIS MEDIA, ACUTE, LEFT 12/26/2009  . PHIMOSIS 02/07/2009  . S/P laparoscopic cholecystectomy 10/31/2010  . Thyroid cancer ([place] 06/13/2010  . THYROID NODULE 02/07/2010   Past Surgical History:  Procedure Laterality Date  . CHOLECYSTECTOMY    . left knee surgery    . right wrist surgury    . THYROID SURGERY     Family History  Problem Relation Age of Onset  . Heart attack Brother 712 . Diabetes Brother   . Cancer Brother     colon  . Colon cancer Brother 639 . Hypertension Mother   . Arthritis Mother   . Goiter Mother   . Heart attack Father 869 . Cancer Sister     breast  . Hypothyroidism Sister    History  Sexual Activity  . Sexual activity: Not on file    Outpatient Encounter Prescriptions as of 05/21/2016  Medication Sig  . amLODipine (NORVASC) 5 MG tablet Take 0.5 tablets (2.5 mg total) by mouth daily.  .Marland Kitchenapixaban (ELIQUIS) 5 MG TABS tablet Take by mouth 2 (two) times daily.  . Ascorbic Acid (VITAMIN C) 1000 MG tablet Take 1,000 mg by mouth daily.    . blood glucose meter kit and supplies KIT Dispense based on patient and insurance preference. Use up to four times daily as directed. (FOR ICD-9 250.00, 250.01).  . Calcium 500-125 MG-UNIT TABS 1 tablet daily.  . calcium carbonate (OS-CAL)  600 MG TABS Take 600 mg by mouth daily.    . Cinnamon 500 MG capsule Take 500 mg by mouth 2 (two) times daily.    . diclofenac sodium (VOLTAREN) 1 % GEL Apply 4 g topically 4 (four) times daily as needed.  . diltiazem (CARDIZEM CD) 120 MG 24 hr capsule Take 1 capsule (120 mg total) by mouth 2 (two) times daily.  . Flaxseed, Linseed, 1000 MG CAPS Take 1 capsule by mouth daily.  . flecainide (TAMBOCOR) 100 MG tablet Take 100 mg by mouth 2 (two) times daily.  . Garlic Oil (ODORLESS GARLIC) 096 MG TABS Take by mouth 2 (two) times daily.    . Ginger, Zingiber officinalis, (GINGER PO) Take by mouth 4 (four) times daily.    . Glucosamine-Chondroit-Vit C-Mn (GLUCOSAMINE  CHONDROITIN COMPLX) CAPS Take by mouth daily.    [name] glucose blood test strip Use as instructed  . levothyroxine (SYNTHROID, LEVOTHROID) 125 MCG tablet TAKE 1 TABLET ONE TIME DAILY BEFORE BREAKFAST  . losartan (COZAAR) 100 MG tablet Take 1 tablet (100 mg total) by mouth daily.  . metFORMIN (GLUCOPHAGE-XR) 500 MG 24 hr tablet 4 tabs by mouth per day  . Misc Natural Products (OSTEO BI-FLEX TRIPLE STRENGTH) TABS Take 1 tablet by mouth.  . Multiple Vitamin (MULTIVITAMIN) capsule Take 1 capsule by mouth daily.    . Omega-3 Fatty Acids (FISH OIL) 1000 MG CAPS Take by mouth 2 (two) times daily.    [name] omeprazole (PRILOSEC) 20 MG capsule Take 1 capsule (20 mg total) by mouth 2 (two) times daily.  [name] triamcinolone (KENALOG) 0.025 % cream Apply 1 application topically 2 (two) times daily.  . Triamcinolone Acetonide (TRIAMCINOLONE 0.1 % CREAM : EUCERIN) CREA Apply 1 application topically.  [name] VITAMIN D, CHOLECALCIFEROL, PO Take by mouth daily.    . [DISCONTINUED] ezetimibe (ZETIA) 10 MG tablet Take 1 tablet (10 mg total) by mouth daily. (Patient not taking: Reported on 05/21/2016)  . [DISCONTINUED] glipiZIDE (GLUCOTROL XL) 2.5 MG 24 hr tablet Take 1 tablet (2.5 mg total) by mouth daily with breakfast. (Patient not taking: Reported on 05/21/2016)   No facility-administered encounter medications on file as of 05/21/2016.     Activities of Daily Living In your present state of health, do you have any difficulty performing the following activities: 05/21/2016  Hearing? N  Vision? N  Difficulty concentrating or making decisions? N  Walking or climbing stairs? Y  Dressing or bathing? N  Doing errands, shopping? N  Preparing Food and eating ? N  Using the Toilet? N  In the past six months, have you accidently leaked urine? N  Do you have problems with loss of bowel control? N  Managing your Medications? N  Managing your Finances? N  Housekeeping or managing your Housekeeping? N  Some recent data might be hidden     Patient Care Team: Biagio Borg, MD as PCP - General   Assessment:    Physical assessment deferred to PCP.  Exercise Activities and Dietary recommendations Current Exercise Habits: The patient does not participate in regular exercise at present, Exercise limited by: orthopedic condition(s) (Discussed water exercise and exercising for brief periods several times per day)  Diet (meal preparation, eat out, water intake, caffeinated beverages, dairy products, fruits and vegetables): Patient reports eating a well balanced diet. States he drinks 3-4 bottles of water per day. Discussed drinking Boost for breakfast.  Education given regarding diabetes diet, low salt, low cholesterol, reading labels and portion control. Suggested patient  to drink glucerna instead of Boost.  Goals    . Control my diabetic condition          Focus on eating low sugar and low carbohydrates, start to exercise    . patient (pt-stated)          Wants to have left knee replacement 1981; goes back April 20th Trying to use bone on bone;         Fall Risk Fall Risk  05/21/2016 05/06/2016 11/07/2015 05/10/2015 09/14/2013  Falls in the past year? No Yes Yes No Yes  Number falls in past yr: - 2 or more 1 - 1  Injury with Fall? - No No - No   Depression Screen PHQ 2/9 Scores 05/21/2016 05/06/2016 11/07/2015 05/10/2015  PHQ - 2 Score 0 0 0 0    Cognitive Function       Ad8 score reviewed for issues:  Issues making decisions: no  Less interest in hobbies / activities: no  Repeats questions, stories (family complaining): no  Trouble using ordinary gadgets (microwave, computer, phone): no  Forgets the month or year: no  Mismanaging finances: no  Remembering appts: no Daily problems with thinking and/or memory: no Ad8 score is= 0     Immunization History  Administered Date(s) Administered  . Influenza Split 12/13/2010  . Influenza Whole 12/09/2007, 12/13/2009  . Influenza, High Dose Seasonal PF  12/10/2014  . Influenza, Seasonal, Injecte, Preservative Fre 12/08/2012  . Influenza,inj,Quad PF,36+ Mos 11/07/2015  . Pneumococcal Conjugate-13 03/22/2013  . Pneumococcal Polysaccharide-23 01/08/2006  . Td 05/17/2009   Screening Tests Health Maintenance  Topic Date Due  . OPHTHALMOLOGY EXAM  03/08/2016  . COLONOSCOPY  08/04/2016  . HEMOGLOBIN A1C  11/03/2016  . FOOT EXAM  05/06/2017  . TETANUS/TDAP  05/18/2019  . INFLUENZA VACCINE  Addressed  . PNA vac Low Risk Adult  Completed      Plan:    Continue doing brain stimulating activities (puzzles, reading, adult coloring books, staying active) to keep memory sharp.   Start to eat heart healthy diet (full of fruits, vegetables, whole grains, lean protein, water--limit salt, fat, and sugar intake) and increase physical activity as tolerated.  Diabetic diet, low cholesterol diet, and low salt diet education attached to patient's AVS  Patient informed nurse that he is not taking glipizide or zetia prescriptions due to the cost of the medicine. Patient also requested a prescription for glucerna, stating this would help with the cost of this supplement. Nurse will notify PCP  During the course of the visit the patient was educated and counseled about the following appropriate screening and preventive services:   Vaccines to include Pneumoccal, Influenza, Hepatitis B, Td, Zostavax, HCV  Cardiovascular Disease  Colorectal cancer screening  Diabetes screening  Prostate Cancer Screening  Glaucoma screening  Nutrition counseling   Patient Instructions (the written plan) was given to the patient.    Michiel Cowboy, RN  05/21/2016  Medical screening examination/treatment/procedure(s) were performed by non-physician practitioner and as supervising physician I was immediately available for consultation/collaboration. I agree with above. Cathlean Cower, MD

## 2016-05-20 NOTE — Progress Notes (Signed)
Pre visit review using our clinic review tool, if applicable. No additional management support is needed unless otherwise documented below in the visit note. 

## 2016-05-21 ENCOUNTER — Ambulatory Visit (INDEPENDENT_AMBULATORY_CARE_PROVIDER_SITE_OTHER): Payer: Medicare Other | Admitting: *Deleted

## 2016-05-21 ENCOUNTER — Telehealth: Payer: Self-pay | Admitting: *Deleted

## 2016-05-21 VITALS — BP 150/85 | HR 54 | Resp 20 | Ht 72.0 in | Wt 244.0 lb

## 2016-05-21 DIAGNOSIS — Z0000 Encounter for general adult medical examination without abnormal findings: Secondary | ICD-10-CM

## 2016-05-21 NOTE — Telephone Encounter (Signed)
Patient informed nurse during his AWV that he is not taking glipizide or zetia prescriptions due to the cost of the medicine. Patient also requested a prescription for glucerna, stating this would help with the cost of this supplement.

## 2016-05-21 NOTE — Telephone Encounter (Signed)
There are no less expensive meds and glucerna is OTC. thanks

## 2016-05-21 NOTE — Patient Instructions (Addendum)
Continue doing brain stimulating activities (puzzles, reading, adult coloring books, staying active) to keep memory sharp.   Start to eat heart healthy diet (full of fruits, vegetables, whole grains, lean protein, water--limit salt, fat, and sugar intake) and increase physical activity as tolerated.  Nathan Russo , Thank you for taking time to come for your Medicare Wellness Visit. I appreciate your ongoing commitment to your health goals. Please review the following plan we discussed and let me know if I can assist you in the future.   These are the goals we discussed: Goals    . Control my diabetic condition          Focus on eating low sugar and low carbohydrates, start to exercise    . patient (pt-stated)          Wants to have left knee replacement 1981; goes back April 20th Trying to use bone on bone;          This is a list of the screening recommended for you and due dates:  Health Maintenance  Topic Date Due  . Eye exam for diabetics  03/08/2016  . Colon Cancer Screening  08/04/2016  . Hemoglobin A1C  11/03/2016  . Complete foot exam   05/06/2017  . Tetanus Vaccine  05/18/2019  . Flu Shot  Addressed  . Pneumonia vaccines  Completed     Diabetes Mellitus and Food It is important for you to manage your blood sugar (glucose) level. Your blood glucose level can be greatly affected by what you eat. Eating healthier foods in the appropriate amounts throughout the day at about the same time each day will help you control your blood glucose level. It can also help slow or prevent worsening of your diabetes mellitus. Healthy eating may even help you improve the level of your blood pressure and reach or maintain a healthy weight. General recommendations for healthful eating and cooking habits include:  Eating meals and snacks regularly. Avoid going long periods of time without eating to lose weight.  Eating a diet that consists mainly of plant-based foods, such as fruits,  vegetables, nuts, legumes, and whole grains.  Using low-heat cooking methods, such as baking, instead of high-heat cooking methods, such as deep frying. Work with your dietitian to make sure you understand how to use the Nutrition Facts information on food labels. How can food affect me? Carbohydrates  Carbohydrates affect your blood glucose level more than any other type of food. Your dietitian will help you determine how many carbohydrates to eat at each meal and teach you how to count carbohydrates. Counting carbohydrates is important to keep your blood glucose at a healthy level, especially if you are using insulin or taking certain medicines for diabetes mellitus. Alcohol  Alcohol can cause sudden decreases in blood glucose (hypoglycemia), especially if you use insulin or take certain medicines for diabetes mellitus. Hypoglycemia can be a life-threatening condition. Symptoms of hypoglycemia (sleepiness, dizziness, and disorientation) are similar to symptoms of having too much alcohol. If your health care provider has given you approval to drink alcohol, do so in moderation and use the following guidelines:  Women should not have more than one drink per day, and men should not have more than two drinks per day. One drink is equal to:  12 oz of beer.  5 oz of wine.  1 oz of hard liquor.  Do not drink on an empty stomach.  Keep yourself hydrated. Have water, diet soda, or unsweetened iced tea.  Regular soda, juice, and other mixers might contain a lot of carbohydrates and should be counted. What foods are not recommended? As you make food choices, it is important to remember that all foods are not the same. Some foods have fewer nutrients per serving than other foods, even though they might have the same number of calories or carbohydrates. It is difficult to get your body what it needs when you eat foods with fewer nutrients. Examples of foods that you should avoid that are high in  calories and carbohydrates but low in nutrients include:  Trans fats (most processed foods list trans fats on the Nutrition Facts label).  Regular soda.  Juice.  Candy.  Sweets, such as cake, pie, doughnuts, and cookies.  Fried foods. What foods can I eat? Eat nutrient-rich foods, which will nourish your body and keep you healthy. The food you should eat also will depend on several factors, including:  The calories you need.  The medicines you take.  Your weight.  Your blood glucose level.  Your blood pressure level.  Your cholesterol level. You should eat a variety of foods, including:  Protein.  Lean cuts of meat.  Proteins low in saturated fats, such as fish, egg whites, and beans. Avoid processed meats.  Fruits and vegetables.  Fruits and vegetables that may help control blood glucose levels, such as apples, mangoes, and yams.  Dairy products.  Choose fat-free or low-fat dairy products, such as milk, yogurt, and cheese.  Grains, bread, pasta, and rice.  Choose whole grain products, such as multigrain bread, whole oats, and brown rice. These foods may help control blood pressure.  Fats.  Foods containing healthful fats, such as nuts, avocado, olive oil, canola oil, and fish. Does everyone with diabetes mellitus have the same meal plan? Because every person with diabetes mellitus is different, there is not one meal plan that works for everyone. It is very important that you meet with a dietitian who will help you create a meal plan that is just right for you. This information is not intended to replace advice given to you by your health care provider. Make sure you discuss any questions you have with your health care provider. Document Released: 11/21/2004 Document Revised: 08/02/2015 Document Reviewed: 01/21/2013 Elsevier Interactive Patient Education  2017 [employer]  Fat and Cholesterol Restricted Diet High levels of fat and cholesterol in your blood  may lead to various health problems, such as diseases of the heart, blood vessels, gallbladder, liver, and pancreas. Fats are concentrated sources of energy that come in various forms. Certain types of fat, including saturated fat, may be harmful in excess. Cholesterol is a substance needed by your body in small amounts. Your body makes all the cholesterol it needs. Excess cholesterol comes from the food you eat. When you have high levels of cholesterol and saturated fat in your blood, health problems can develop because the excess fat and cholesterol will gather along the walls of your blood vessels, causing them to narrow. Choosing the right foods will help you control your intake of fat and cholesterol. This will help keep the levels of these substances in your blood within normal limits and reduce your risk of disease. What is my plan? Your health care provider recommends that you:  Limit your fat intake to ______% or less of your total calories per day.  Limit the amount of cholesterol in your diet to less than _________mg per day.  Eat 20-30 grams of fiber each day.  What types of fat should I choose?  Choose healthy fats more often. Choose monounsaturated and polyunsaturated fats, such as olive and canola oil, flaxseeds, walnuts, almonds, and seeds.  Eat more omega-3 fats. Good choices include salmon, mackerel, sardines, tuna, flaxseed oil, and ground flaxseeds. Aim to eat fish at least two times a week.  Limit saturated fats. Saturated fats are primarily found in animal products, such as meats, butter, and cream. Plant sources of saturated fats include palm oil, palm kernel oil, and coconut oil.  Avoid foods with partially hydrogenated oils in them. These contain trans fats. Examples of foods that contain trans fats are stick margarine, some tub margarines, cookies, crackers, and other baked goods. What general guidelines do I need to follow? These guidelines for healthy eating will help  you control your intake of fat and cholesterol:  Check food labels carefully to identify foods with trans fats or high amounts of saturated fat.  Fill one half of your plate with vegetables and green salads.  Fill one fourth of your plate with whole grains. Look for the word "whole" as the first word in the ingredient list.  Fill one fourth of your plate with lean protein foods.  Limit fruit to two servings a day. Choose fruit instead of juice.  Eat more foods that contain fiber, such as apples, broccoli, carrots, beans, peas, and barley.  Eat more home-cooked food and less restaurant, buffet, and fast food.  Limit or avoid alcohol.  Limit foods high in starch and sugar.  Limit fried foods.  Cook foods using methods other than frying. Baking, boiling, grilling, and broiling are all great options.  Lose weight if you are overweight. Losing just 5-10% of your initial body weight can help your overall health and prevent diseases such as diabetes and heart disease. What foods can I eat? Grains   Whole grains, such as whole wheat or whole grain breads, crackers, cereals, and pasta. Unsweetened oatmeal, bulgur, barley, quinoa, or brown rice. Corn or whole wheat flour tortillas. Vegetables   Fresh or frozen vegetables (raw, steamed, roasted, or grilled). Green salads. Fruits   All fresh, canned (in natural juice), or frozen fruits. Meats and other protein foods   Ground beef (85% or leaner), grass-fed beef, or beef trimmed of fat. Skinless chicken or [place]. Ground chicken or [place]. Pork trimmed of fat. All fish and seafood. Eggs. Dried beans, peas, or lentils. Unsalted nuts or seeds. Unsalted canned or dry beans. Dairy   Low-fat dairy products, such as skim or 1% milk, 2% or reduced-fat cheeses, low-fat ricotta or cottage cheese, or plain low-fat yo Fats and oils   Tub margarines without trans fats. Light or reduced-fat mayonnaise and salad dressings. Avocado. Olive, canola,  sesame, or safflower oils. Natural peanut or almond butter (choose ones without added sugar and oil). The items listed above may not be a complete list of recommended foods or beverages. Contact your dietitian for more options.  Foods to avoid Grains   White bread. White pasta. White rice. Cornbread. Bagels, pastries, and croissants. Crackers that contain trans fat. Vegetables   White potatoes. Corn. Creamed or fried vegetables. Vegetables in a cheese sauce. Fruits   Dried fruits. Canned fruit in light or heavy syrup. Fruit juice. Meats and other protein foods   Fatty cuts of meat. Ribs, chicken wings, bacon, sausage, bologna, salami, chitterlings, fatback, hot dogs, bratwurst, and packaged luncheon meats. Liver and organ meats. Dairy   Whole or 2% milk, cream, half-and-half, and cream  cheese. Whole milk cheeses. Whole-fat or sweetened yogurt. Full-fat cheeses. Nondairy creamers and whipped toppings. Processed cheese, cheese spreads, or cheese curds. Beverages   Alcohol. Sweetened drinks (such as sodas, lemonade, and fruit drinks or punches). Fats and oils   Butter, stick margarine, lard, shortening, ghee, or bacon fat. Coconut, palm kernel, or palm oils. Sweets and desserts   Corn syrup, sugars, honey, and molasses. Candy. Jam and jelly. Syrup. Sweetened cereals. Cookies, pies, cakes, donuts, muffins, and ice cream. The items listed above may not be a complete list of foods and beverages to avoid. Contact your dietitian for more information.  This information is not intended to replace advice given to you by your health care provider. Make sure you discuss any questions you have with your health care provider. Document Released: 02/24/2005 Document Revised: 03/17/2014 Document Reviewed: 05/25/2013 Elsevier Interactive Patient Education  2017 [place] DASH stands for "Dietary Approaches to Stop Hypertension." The DASH eating plan is a healthy eating plan that  has been shown to reduce high blood pressure (hypertension). It may also reduce your risk for type 2 diabetes, heart disease, and stroke. The DASH eating plan may also help with weight loss. What are tips for following this plan? General guidelines   Avoid eating more than 2,300 mg (milligrams) of salt (sodium) a day. If you have hypertension, you may need to reduce your sodium intake to 1,500 mg a day.  Limit alcohol intake to no more than 1 drink a day for nonpregnant women and 2 drinks a day for men. One drink equals 12 oz of beer, 5 oz of wine, or 1 oz of hard liquor.  Work with your health care provider to maintain a healthy body weight or to lose weight. Ask what an ideal weight is for you.  Get at least 30 minutes of exercise that causes your heart to beat faster (aerobic exercise) most days of the week. Activities may include walking, swimming, or biking.  Work with your health care provider or diet and nutrition specialist (dietitian) to adjust your eating plan to your individual calorie needs. Reading food labels   Check food labels for the amount of sodium per serving. Choose foods with less than 5 percent of the Daily Value of sodium. Generally, foods with less than 300 mg of sodium per serving fit into this eating plan.  To find whole grains, look for the word "whole" as the first word in the ingredient list. Shopping   Buy products labeled as "low-sodium" or "no salt added."  Buy fresh foods. Avoid canned foods and premade or frozen meals. Cooking   Avoid adding salt when cooking. Use salt-free seasonings or herbs instead of table salt or sea salt. Check with your health care provider or pharmacist before using salt substitutes.  Do not fry foods. Cook foods using healthy methods such as baking, boiling, grilling, and broiling instead.  Cook with heart-healthy oils, such as olive, canola, soybean, or sunflower oil. Meal planning    Eat a balanced diet that  includes:  5 or more servings of fruits and vegetables each day. At each meal, try to fill half of your plate with fruits and vegetables.  Up to 6-8 servings of whole grains each day.  Less than 6 oz of lean meat, poultry, or fish each day. A 3-oz serving of meat is about the same size as a deck of cards. One egg equals 1 oz.  2 servings of low-fat dairy each  day.  A serving of nuts, seeds, or beans 5 times each week.  Heart-healthy fats. Healthy fats called Omega-3 fatty acids are found in foods such as flaxseeds and coldwater fish, like sardines, salmon, and mackerel.  Limit how much you eat of the following:  Canned or prepackaged foods.  Food that is high in trans fat, such as fried foods.  Food that is high in saturated fat, such as fatty meat.  Sweets, desserts, sugary drinks, and other foods with added sugar.  Full-fat dairy products.  Do not salt foods before eating.  Try to eat at least 2 vegetarian meals each week.  Eat more home-cooked food and less restaurant, buffet, and fast food.  When eating at a restaurant, ask that your food be prepared with less salt or no salt, if possible. What foods are recommended? The items listed may not be a complete list. Talk with your dietitian about what dietary choices are best for you. Grains  Whole-grain or whole-wheat bread. Whole-grain or whole-wheat pasta. Brown rice. [place]. Bulgur. Whole-grain and low-sodium cereals. Pita bread. Low-fat, low-sodium crackers. Whole-wheat flour tortillas. Vegetables  Fresh or frozen vegetables (raw, steamed, roasted, or grilled). Low-sodium or reduced-sodium tomato and vegetable juice. Low-sodium or reduced-sodium tomato sauce and tomato paste. Low-sodium or reduced-sodium canned vegetables. Fruits  All fresh, dried, or frozen fruit. Canned fruit in natural juice (without added sugar). Meat and other protein foods  Skinless chicken or [place]. Ground chicken or [place]. Pork with  fat trimmed off. Fish and seafood. Egg whites. Dried beans, peas, or lentils. Unsalted nuts, nut butters, and seeds. Unsalted canned beans. Lean cuts of beef with fat trimmed off. Low-sodium, lean deli meat. Dairy  Low-fat (1%) or fat-free (skim) milk. Fat-free, low-fat, or reduced-fat cheeses. Nonfat, low-sodium ricotta or cottage cheese. Low-fat or nonfat yogurt. Low-fat, low-sodium cheese. Fats and oils  Soft margarine without trans fats. Vegetable oil. Low-fat, reduced-fat, or light mayonnaise and salad dressings (reduced-sodium). Canola, safflower, olive, soybean, and sunflower oils. Avocado. Seasoning and other foods  Herbs. Spices. Seasoning mixes without salt. Unsalted popcorn and pretzels. Fat-free sweets. What foods are not recommended? The items listed may not be a complete list. Talk with your dietitian about what dietary choices are best for you. Grains  Baked goods made with fat, such as croissants, muffins, or some breads. Dry pasta or rice meal packs. Vegetables  Creamed or fried vegetables. Vegetables in a cheese sauce. Regular canned vegetables (not low-sodium or reduced-sodium). Regular canned tomato sauce and paste (not low-sodium or reduced-sodium). Regular tomato and vegetable juice (not low-sodium or reduced-sodium). Angie Fava. Olives. Fruits  Canned fruit in a light or heavy syrup. Fried fruit. Fruit in cream or butter sauce. Meat and other protein foods  Fatty cuts of meat. Ribs. Fried meat. Berniece Salines. Sausage. Bologna and other processed lunch meats. Salami. Fatback. Hotdogs. Bratwurst. Salted nuts and seeds. Canned beans with added salt. Canned or smoked fish. Whole eggs or egg yolks. Chicken or [place] with skin. Dairy  Whole or 2% milk, cream, and half-and-half. Whole or full-fat cream cheese. Whole-fat or sweetened yogurt. Full-fat cheese. Nondairy creamers. Whipped toppings. Processed cheese and cheese spreads. Fats and oils  Butter. Stick margarine. Lard. Shortening.  Ghee. Bacon fat. Tropical oils, such as coconut, palm kernel, or palm oil. Seasoning and other foods  Salted popcorn and pretzels. Onion salt, garlic salt, seasoned salt, table salt, and sea salt. Worcestershire sauce. Tartar sauce. Barbecue sauce. Teriyaki sauce. Soy sauce, including reduced-sodium. Steak sauce. Canned and  packaged gravies. Fish sauce. Oyster sauce. Cocktail sauce. Horseradish that you find on the shelf. Ketchup. Mustard. Meat flavorings and tenderizers. Bouillon cubes. Hot sauce and Tabasco sauce. Premade or packaged marinades. Premade or packaged taco seasonings. Relishes. Regular salad dressings. Where to find more information:  National Heart, Lung, and [place]: https://wilson-eaton.com/  American Heart Association: www.heart.org Summary  The DASH eating plan is a healthy eating plan that has been shown to reduce high blood pressure (hypertension). It may also reduce your risk for type 2 diabetes, heart disease, and stroke.  With the DASH eating plan, you should limit salt (sodium) intake to 2,300 mg a day. If you have hypertension, you may need to reduce your sodium intake to 1,500 mg a day.  When on the DASH eating plan, aim to eat more fresh fruits and vegetables, whole grains, lean proteins, low-fat dairy, and heart-healthy fats.  Work with your health care provider or diet and nutrition specialist (dietitian) to adjust your eating plan to your individual calorie needs. This information is not intended to replace advice given to you by your health care provider. Make sure you discuss any questions you have with your health care provider. Document Released: 02/13/2011 Document Revised: 02/18/2016 Document Reviewed: 02/18/2016 Elsevier Interactive Patient Education  2017 [employer].

## 2016-05-22 NOTE — Telephone Encounter (Signed)
Called patient to inform him that [name] was notified regarding him not currently taking glipizide and zetia and explained that no other replacement prescriptions would be more economical. Discussed his request for glucerna prescription which is OTC and a prescription is not necessary, patient verbalized understanding.

## 2016-05-23 ENCOUNTER — Ambulatory Visit (INDEPENDENT_AMBULATORY_CARE_PROVIDER_SITE_OTHER): Payer: Medicare Other | Admitting: Sports Medicine

## 2016-05-23 ENCOUNTER — Encounter: Payer: Self-pay | Admitting: Sports Medicine

## 2016-05-23 DIAGNOSIS — M79674 Pain in right toe(s): Secondary | ICD-10-CM | POA: Diagnosis not present

## 2016-05-23 DIAGNOSIS — I739 Peripheral vascular disease, unspecified: Secondary | ICD-10-CM | POA: Diagnosis not present

## 2016-05-23 DIAGNOSIS — B351 Tinea unguium: Secondary | ICD-10-CM | POA: Diagnosis not present

## 2016-05-23 DIAGNOSIS — M79675 Pain in left toe(s): Secondary | ICD-10-CM

## 2016-05-23 DIAGNOSIS — E1140 Type 2 diabetes mellitus with diabetic neuropathy, unspecified: Secondary | ICD-10-CM | POA: Diagnosis not present

## 2016-05-23 NOTE — Progress Notes (Signed)
Subjective: Nathan Russo is a 76 y.o. male patient with history of diabetes who presents to office today complaining of long, painful nails  while ambulating in shoes; unable to trim. Patient states that the glucose reading this morning was 110 mg/dl, last A1c 7.9. Patient denies any new changes in medication or new problems. Patient denies any new cramping, numbness, burning or tingling in the legs. Admits to a little tingling in the ball of both feet.   Patient Active Problem List   Diagnosis Date Noted  . Leg abscess 03/04/2016  . Right leg pain 02/29/2016  . Cough 02/23/2016  . PAT (paroxysmal atrial tachycardia) ([place]) 09/07/2015  . Abnormal breath sounds 01/11/2015  . Otitis externa 08/02/2014  . Hypersomnia 09/14/2013  . Dyspnea on exertion 04/12/2013  . Hypotension 04/12/2013  . Community acquired pneumonia 04/06/2013  . Status post ablation of atrial fibrillation 03/17/2013  . Status post ablation of atrial flutter 03/17/2013  . Postoperative hypothyroidism 05/05/2012  . Papillary carcinoma of thyroid ([place]) 05/05/2012  . Essential hypertension 03/28/2012  . Atrial fibrillation and flutter ([place]) 03/27/2012  . Pain in wrist 09/03/2011  . Depression 02/23/2011  . Vertigo 02/23/2011  . Supraventricular arrhythmia 02/06/2011  . Chronic pain 12/13/2010  . History of cholecystectomy 10/31/2010  . Encounter for general adult medical examination without abnormal findings 10/31/2010  . Postoperative state 10/31/2010  . Routine history and physical examination of adult 10/31/2010  . Bladder neck obstruction 06/13/2010  . Elevated prostate specific antigen (PSA) 06/13/2010  . Gastroesophageal reflux disease 02/11/2009  . CARPAL TUNNEL SYNDROME, BILATERAL 02/07/2009  . Diabetes mellitus ([place]) 04/28/2007  . Hyperlipidemia 04/28/2007  . Malaise and fatigue 04/28/2007  . History of colonic polyps 04/28/2007  . Adiposity 10/24/2006  . BRADYCARDIA, CHRONIC 10/24/2006  . Osteoarthritis  10/24/2006  . Cardiac arrhythmia 10/24/2006  . HYPERTENSION 10/21/2006   Current Outpatient Prescriptions on File Prior to Visit  Medication Sig Dispense Refill  . amLODipine (NORVASC) 5 MG tablet Take 0.5 tablets (2.5 mg total) by mouth daily. 45 tablet 3  . apixaban (ELIQUIS) 5 MG TABS tablet Take by mouth 2 (two) times daily.    . Ascorbic Acid (VITAMIN C) 1000 MG tablet Take 1,000 mg by mouth daily.      . blood glucose meter kit and supplies KIT Dispense based on patient and insurance preference. Use up to four times daily as directed. (FOR ICD-9 250.00, 250.01). 1 each 0  . Calcium 500-125 MG-UNIT TABS 1 tablet daily.    . calcium carbonate (OS-CAL) 600 MG TABS Take 600 mg by mouth daily.      . Cinnamon 500 MG capsule Take 500 mg by mouth 2 (two) times daily.      . diclofenac sodium (VOLTAREN) 1 % GEL Apply 4 g topically 4 (four) times daily as needed. 400 g 11  . diltiazem (CARDIZEM CD) 120 MG 24 hr capsule Take 1 capsule (120 mg total) by mouth 2 (two) times daily. 180 capsule 3  . Flaxseed, Linseed, 1000 MG CAPS Take 1 capsule by mouth daily.    . flecainide (TAMBOCOR) 100 MG tablet Take 100 mg by mouth 2 (two) times daily.    . Garlic Oil (ODORLESS GARLIC) 628 MG TABS Take by mouth 2 (two) times daily.      . Ginger, Zingiber officinalis, (GINGER PO) Take by mouth 4 (four) times daily.      . Glucosamine-Chondroit-Vit C-Mn (GLUCOSAMINE CHONDROITIN COMPLX) CAPS Take by mouth daily.      [name]  glucose blood test strip Use as instructed 100 each 12  . levothyroxine (SYNTHROID, LEVOTHROID) 125 MCG tablet TAKE 1 TABLET ONE TIME DAILY BEFORE BREAKFAST 90 tablet 3  . losartan (COZAAR) 100 MG tablet Take 1 tablet (100 mg total) by mouth daily. 90 tablet 1  . metFORMIN (GLUCOPHAGE-XR) 500 MG 24 hr tablet 4 tabs by mouth per day 360 tablet 3  . Misc Natural Products (OSTEO BI-FLEX TRIPLE STRENGTH) TABS Take 1 tablet by mouth.    . Multiple Vitamin (MULTIVITAMIN) capsule Take 1 capsule by mouth  daily.      . Omega-3 Fatty Acids (FISH OIL) 1000 MG CAPS Take by mouth 2 (two) times daily.      [name] omeprazole (PRILOSEC) 20 MG capsule Take 1 capsule (20 mg total) by mouth 2 (two) times daily. 180 capsule 3  . triamcinolone (KENALOG) 0.025 % cream Apply 1 application topically 2 (two) times daily.    . Triamcinolone Acetonide (TRIAMCINOLONE 0.1 % CREAM : EUCERIN) CREA Apply 1 application topically.    [name] VITAMIN D, CHOLECALCIFEROL, PO Take by mouth daily.       No current facility-administered medications on file prior to visit.    Allergies  Allergen Reactions  . Ace Inhibitors     REACTION: cough  . Atenolol     REACTION: bradycardia  . Codeine Other (See Comments)    Head spins "wild"  . Levaquin [Levofloxacin]     Interferes with flecainide  . Lovastatin     REACTION: myalygros  . Morphine Nausea And Vomiting  . Morphine And Related   . Statins   . Sulfa Antibiotics Other (See Comments)    As child almost died  . Sulfamethoxazole Other (See Comments)    Childhood unknown reaction  . Tramadol     Ants crawling all over    Recent Results (from the past 2160 hour(s))  Hemoglobin A1c     Status: Abnormal   Collection Time: 05/06/16  9:07 AM  Result Value Ref Range   Hgb A1c MFr Bld 7.9 (H) 4.6 - 6.5 %    Comment: Glycemic Control Guidelines for People with Diabetes:Non Diabetic:  <6%Goal of Therapy: <7%Additional Action Suggested:  >7%   Basic metabolic panel     Status: Abnormal   Collection Time: 05/06/16  9:07 AM  Result Value Ref Range   Sodium 140 135 - 145 mEq/L   Potassium 4.3 3.5 - 5.1 mEq/L   Chloride 104 96 - 112 mEq/L   CO2 27 19 - 32 mEq/L   Glucose, Bld 183 (H) 70 - 99 mg/dL   BUN 18 6 - 23 mg/dL   Creatinine, Ser 1.32 0.40 - 1.50 mg/dL   Calcium 10.1 8.4 - 10.5 mg/dL   GFR 55.79 (L) >60.00 mL/min  Lipid panel     Status: Abnormal   Collection Time: 05/06/16  9:07 AM  Result Value Ref Range   Cholesterol 188 0 - 200 mg/dL    Comment: ATP III  Classification       Desirable:  < 200 mg/dL               Borderline High:  200 - 239 mg/dL          High:  > = 240 mg/dL   Triglycerides 189.0 (H) 0.0 - 149.0 mg/dL    Comment: Normal:  <150 mg/dLBorderline High:  150 - 199 mg/dL   HDL 38.00 (L) >39.00 mg/dL   VLDL 37.8 0.0 - 40.0 mg/dL  LDL Cholesterol 112 (H) 0 - 99 mg/dL   Total CHOL/HDL Ratio 5     Comment:                Men          Women1/2 Average Risk     3.4          3.3Average Risk          5.0          4.42X Average Risk          9.6          7.13X Average Risk          15.0          11.0                       NonHDL 149.92     Comment: NOTE:  Non-HDL goal should be 30 mg/dL higher than patient's LDL goal (i.e. LDL goal of < 70 mg/dL, would have non-HDL goal of < 100 mg/dL)  Hepatic function panel     Status: None   Collection Time: 05/06/16  9:07 AM  Result Value Ref Range   Total Bilirubin 0.8 0.2 - 1.2 mg/dL   Bilirubin, Direct 0.2 0.0 - 0.3 mg/dL   Alkaline Phosphatase 60 39 - 117 U/L   AST 20 0 - 37 U/L   ALT 27 0 - 53 U/L   Total Protein 7.2 6.0 - 8.3 g/dL   Albumin 4.4 3.5 - 5.2 g/dL    Objective: General: Patient is awake, alert, and oriented x 3 and in no acute distress.  Integument: Skin is warm, dry and supple bilateral. Nails are tender, long, thickened and dystrophic with subungual debris, consistent with onychomycosis, 1-5 bilateral. No signs of infection. No open lesions or preulcerative lesions present bilateral. Remaining integument unremarkable.  Vasculature:  Dorsalis Pedis pulse 1/4 bilateral. Posterior Tibial pulse  1/4 bilateral. Capillary fill time <5 sec 1-5 bilateral. Scant hair growth to the level of the digits.Temperature gradient within normal limits. Mild varicosities present bilateral. No edema present bilateral.   Neurology: The patient has intact sensation measured with a 5.07/10g Semmes Weinstein Monofilament at all pedal sites bilateral . Vibratory sensation diminished bilateral with  tuning fork. No Babinski sign present bilateral.   Musculoskeletal: No symptomatic pedal deformities noted bilateral. Muscular strength 5/5 in all lower extremity muscular groups bilateral without pain on range of motion . No tenderness with calf compression bilateral.  Assessment and Plan: Problem List Items Addressed This Visit      Endocrine   Diabetes mellitus ([place])    Other Visit Diagnoses    Dermatophytosis of nail    -  Primary   Toe pain, bilateral       PVD (peripheral vascular disease) ([place])          -Examined patient. -Discussed and educated patient on diabetic foot care, especially with  regards to the vascular, neurological and musculoskeletal systems.  -Stressed the importance of good glycemic control and the detriment of not controlling glucose levels in relation to the foot. -Mechanically debrided all nails 1-5 bilateral using sterile nail nipper and filed with dremel without incident  -Recommend OTC capsaicin cream for occasional tingling in feet -Answered all patient questions -Patient to return  in 3 months for at risk foot care -Patient advised to call the office if any problems or questions arise in the meantime.  Landis Martins, DPM

## 2016-05-23 NOTE — Patient Instructions (Addendum)
Over the counter Capsaicin Cream for tingling in feet  Diabetes and Foot Care Diabetes may cause you to have problems because of poor blood supply (circulation) to your feet and legs. This may cause the skin on your feet to become thinner, break easier, and heal more slowly. Your skin may become dry, and the skin may peel and crack. You may also have nerve damage in your legs and feet causing decreased feeling in them. You may not notice minor injuries to your feet that could lead to infections or more serious problems. Taking care of your feet is one of the most important things you can do for yourself. Follow these instructions at home:  Wear shoes at all times, even in the house. Do not go barefoot. Bare feet are easily injured.  Check your feet daily for blisters, cuts, and redness. If you cannot see the bottom of your feet, use a mirror or ask someone for help.  Wash your feet with warm water (do not use hot water) and mild soap. Then pat your feet and the areas between your toes until they are completely dry. Do not soak your feet as this can dry your skin.  Apply a moisturizing lotion or petroleum jelly (that does not contain alcohol and is unscented) to the skin on your feet and to dry, brittle toenails. Do not apply lotion between your toes.  Trim your toenails straight across. Do not dig under them or around the cuticle. File the edges of your nails with an emery board or nail file.  Do not cut corns or calluses or try to remove them with medicine.  Wear clean socks or stockings every day. Make sure they are not too tight. Do not wear knee-high stockings since they may decrease blood flow to your legs.  Wear shoes that fit properly and have enough cushioning. To break in new shoes, wear them for just a few hours a day. This prevents you from injuring your feet. Always look in your shoes before you put them on to be sure there are no objects inside.  Do not cross your legs. This may  decrease the blood flow to your feet.  If you find a minor scrape, cut, or break in the skin on your feet, keep it and the skin around it clean and dry. These areas may be cleansed with mild soap and water. Do not cleanse the area with peroxide, alcohol, or iodine.  When you remove an adhesive bandage, be sure not to damage the skin around it.  If you have a wound, look at it several times a day to make sure it is healing.  Do not use heating pads or hot water bottles. They may burn your skin. If you have lost feeling in your feet or legs, you may not know it is happening until it is too late.  Make sure your health care provider performs a complete foot exam at least annually or more often if you have foot problems. Report any cuts, sores, or bruises to your health care provider immediately. Contact a health care provider if:  You have an injury that is not healing.  You have cuts or breaks in the skin.  You have an ingrown nail.  You notice redness on your legs or feet.  You feel burning or tingling in your legs or feet.  You have pain or cramps in your legs and feet.  Your legs or feet are numb.  Your feet always feel  cold. Get help right away if:  There is increasing redness, swelling, or pain in or around a wound.  There is a red line that goes up your leg.  Pus is coming from a wound.  You develop a fever or as directed by your health care provider.  You notice a bad smell coming from an ulcer or wound. This information is not intended to replace advice given to you by your health care provider. Make sure you discuss any questions you have with your health care provider. Document Released: 02/22/2000 Document Revised: 08/02/2015 Document Reviewed: 08/03/2012 Elsevier Interactive Patient Education  2017 [employer].

## 2016-06-05 ENCOUNTER — Encounter: Payer: Self-pay | Admitting: Gastroenterology

## 2016-06-20 ENCOUNTER — Other Ambulatory Visit: Payer: Self-pay | Admitting: Internal Medicine

## 2016-08-20 DIAGNOSIS — M7541 Impingement syndrome of right shoulder: Secondary | ICD-10-CM | POA: Diagnosis not present

## 2016-08-20 DIAGNOSIS — M75101 Unspecified rotator cuff tear or rupture of right shoulder, not specified as traumatic: Secondary | ICD-10-CM | POA: Diagnosis not present

## 2016-08-20 DIAGNOSIS — M25511 Pain in right shoulder: Secondary | ICD-10-CM | POA: Diagnosis not present

## 2016-08-27 DIAGNOSIS — M75101 Unspecified rotator cuff tear or rupture of right shoulder, not specified as traumatic: Secondary | ICD-10-CM | POA: Diagnosis not present

## 2016-08-27 DIAGNOSIS — M7541 Impingement syndrome of right shoulder: Secondary | ICD-10-CM | POA: Diagnosis not present

## 2016-09-02 DIAGNOSIS — M7541 Impingement syndrome of right shoulder: Secondary | ICD-10-CM | POA: Diagnosis not present

## 2016-09-02 DIAGNOSIS — M75101 Unspecified rotator cuff tear or rupture of right shoulder, not specified as traumatic: Secondary | ICD-10-CM | POA: Diagnosis not present

## 2016-09-03 DIAGNOSIS — Z483 Aftercare following surgery for neoplasm: Secondary | ICD-10-CM | POA: Diagnosis not present

## 2016-09-03 DIAGNOSIS — Z08 Encounter for follow-up examination after completed treatment for malignant neoplasm: Secondary | ICD-10-CM | POA: Diagnosis not present

## 2016-09-03 DIAGNOSIS — E890 Postprocedural hypothyroidism: Secondary | ICD-10-CM | POA: Diagnosis not present

## 2016-09-03 DIAGNOSIS — Z85850 Personal history of malignant neoplasm of thyroid: Secondary | ICD-10-CM | POA: Diagnosis not present

## 2016-09-03 DIAGNOSIS — C73 Malignant neoplasm of thyroid gland: Secondary | ICD-10-CM | POA: Diagnosis not present

## 2016-09-04 DIAGNOSIS — M75101 Unspecified rotator cuff tear or rupture of right shoulder, not specified as traumatic: Secondary | ICD-10-CM | POA: Diagnosis not present

## 2016-09-04 DIAGNOSIS — M7541 Impingement syndrome of right shoulder: Secondary | ICD-10-CM | POA: Diagnosis not present

## 2016-09-05 ENCOUNTER — Ambulatory Visit: Payer: Medicare Other | Admitting: Sports Medicine

## 2016-09-09 DIAGNOSIS — M7541 Impingement syndrome of right shoulder: Secondary | ICD-10-CM | POA: Diagnosis not present

## 2016-09-09 DIAGNOSIS — M75101 Unspecified rotator cuff tear or rupture of right shoulder, not specified as traumatic: Secondary | ICD-10-CM | POA: Diagnosis not present

## 2016-09-11 DIAGNOSIS — M75101 Unspecified rotator cuff tear or rupture of right shoulder, not specified as traumatic: Secondary | ICD-10-CM | POA: Diagnosis not present

## 2016-09-11 DIAGNOSIS — M7541 Impingement syndrome of right shoulder: Secondary | ICD-10-CM | POA: Diagnosis not present

## 2016-09-16 DIAGNOSIS — M75101 Unspecified rotator cuff tear or rupture of right shoulder, not specified as traumatic: Secondary | ICD-10-CM | POA: Diagnosis not present

## 2016-09-16 DIAGNOSIS — M7541 Impingement syndrome of right shoulder: Secondary | ICD-10-CM | POA: Diagnosis not present

## 2016-09-17 DIAGNOSIS — C73 Malignant neoplasm of thyroid gland: Secondary | ICD-10-CM | POA: Diagnosis not present

## 2016-09-23 DIAGNOSIS — M75101 Unspecified rotator cuff tear or rupture of right shoulder, not specified as traumatic: Secondary | ICD-10-CM | POA: Diagnosis not present

## 2016-09-23 DIAGNOSIS — M7541 Impingement syndrome of right shoulder: Secondary | ICD-10-CM | POA: Diagnosis not present

## 2016-09-25 DIAGNOSIS — M75101 Unspecified rotator cuff tear or rupture of right shoulder, not specified as traumatic: Secondary | ICD-10-CM | POA: Diagnosis not present

## 2016-09-25 DIAGNOSIS — M7541 Impingement syndrome of right shoulder: Secondary | ICD-10-CM | POA: Diagnosis not present

## 2016-09-30 ENCOUNTER — Ambulatory Visit (INDEPENDENT_AMBULATORY_CARE_PROVIDER_SITE_OTHER): Payer: Medicare Other | Admitting: Podiatry

## 2016-09-30 ENCOUNTER — Encounter: Payer: Self-pay | Admitting: Podiatry

## 2016-09-30 DIAGNOSIS — B351 Tinea unguium: Secondary | ICD-10-CM | POA: Diagnosis not present

## 2016-09-30 DIAGNOSIS — M79676 Pain in unspecified toe(s): Secondary | ICD-10-CM | POA: Diagnosis not present

## 2016-09-30 DIAGNOSIS — E1140 Type 2 diabetes mellitus with diabetic neuropathy, unspecified: Secondary | ICD-10-CM

## 2016-09-30 NOTE — Progress Notes (Signed)
Complaint:  Visit Type: Patient returns to my office for continued preventative foot care services. Complaint: Patient states" my nails have grown long and thick and become painful to walk and wear shoes" Patient has been diagnosed with DM with no foot complications. The patient presents for preventative foot care services. No changes to ROS  Podiatric Exam: Vascular: dorsalis pedis and posterior tibial pulses are palpable bilateral. Capillary return is immediate. Temperature gradient is WNL. Skin turgor WNL  Sensorium: Normal Semmes Weinstein monofilament test. Normal tactile sensation bilaterally. Nail Exam: Pt has thick disfigured discolored nails with subungual debris noted bilateral entire nail hallux through fifth toenails Ulcer Exam: There is no evidence of ulcer or pre-ulcerative changes or infection. Orthopedic Exam: Muscle tone and strength are WNL. No limitations in general ROM. No crepitus or effusions noted. Foot type and digits show no abnormalities. Bony prominences are unremarkable. Skin: No Porokeratosis. No infection or ulcers  Diagnosis:  Onychomycosis, , Pain in right toe, pain in left toes  Treatment & Plan Procedures and Treatment: Consent by patient was obtained for treatment procedures. The patient understood the discussion of treatment and procedures well. All questions were answered thoroughly reviewed. Debridement of mycotic and hypertrophic toenails, 1 through 5 bilateral and clearing of subungual debris. No ulceration, no infection noted.  Return Visit-Office Procedure: Patient instructed to return to the office for a follow up visit 3 months for continued evaluation and treatment.    Loyde Orth DPM 

## 2016-10-02 DIAGNOSIS — M7541 Impingement syndrome of right shoulder: Secondary | ICD-10-CM | POA: Diagnosis not present

## 2016-10-02 DIAGNOSIS — M75101 Unspecified rotator cuff tear or rupture of right shoulder, not specified as traumatic: Secondary | ICD-10-CM | POA: Diagnosis not present

## 2016-10-10 DIAGNOSIS — I471 Supraventricular tachycardia: Secondary | ICD-10-CM | POA: Diagnosis not present

## 2016-10-10 DIAGNOSIS — I10 Essential (primary) hypertension: Secondary | ICD-10-CM | POA: Diagnosis not present

## 2016-10-10 DIAGNOSIS — I480 Paroxysmal atrial fibrillation: Secondary | ICD-10-CM | POA: Diagnosis not present

## 2016-10-10 DIAGNOSIS — E0800 Diabetes mellitus due to underlying condition with hyperosmolarity without nonketotic hyperglycemic-hyperosmolar coma (NKHHC): Secondary | ICD-10-CM | POA: Diagnosis not present

## 2016-10-21 DIAGNOSIS — M75101 Unspecified rotator cuff tear or rupture of right shoulder, not specified as traumatic: Secondary | ICD-10-CM | POA: Diagnosis not present

## 2016-10-21 DIAGNOSIS — M7541 Impingement syndrome of right shoulder: Secondary | ICD-10-CM | POA: Diagnosis not present

## 2016-10-23 DIAGNOSIS — M75101 Unspecified rotator cuff tear or rupture of right shoulder, not specified as traumatic: Secondary | ICD-10-CM | POA: Diagnosis not present

## 2016-10-23 DIAGNOSIS — M7541 Impingement syndrome of right shoulder: Secondary | ICD-10-CM | POA: Diagnosis not present

## 2016-11-04 ENCOUNTER — Encounter: Payer: Self-pay | Admitting: Internal Medicine

## 2016-11-04 ENCOUNTER — Ambulatory Visit (INDEPENDENT_AMBULATORY_CARE_PROVIDER_SITE_OTHER): Payer: Medicare Other | Admitting: Internal Medicine

## 2016-11-04 ENCOUNTER — Other Ambulatory Visit: Payer: Self-pay | Admitting: Internal Medicine

## 2016-11-04 ENCOUNTER — Other Ambulatory Visit (INDEPENDENT_AMBULATORY_CARE_PROVIDER_SITE_OTHER): Payer: Medicare Other

## 2016-11-04 VITALS — BP 144/80 | HR 60 | Temp 97.9°F | Ht 72.0 in | Wt 238.0 lb

## 2016-11-04 DIAGNOSIS — E785 Hyperlipidemia, unspecified: Secondary | ICD-10-CM | POA: Diagnosis not present

## 2016-11-04 DIAGNOSIS — Z23 Encounter for immunization: Secondary | ICD-10-CM

## 2016-11-04 DIAGNOSIS — N320 Bladder-neck obstruction: Secondary | ICD-10-CM

## 2016-11-04 DIAGNOSIS — E1140 Type 2 diabetes mellitus with diabetic neuropathy, unspecified: Secondary | ICD-10-CM

## 2016-11-04 DIAGNOSIS — R972 Elevated prostate specific antigen [PSA]: Secondary | ICD-10-CM

## 2016-11-04 DIAGNOSIS — I10 Essential (primary) hypertension: Secondary | ICD-10-CM | POA: Diagnosis not present

## 2016-11-04 DIAGNOSIS — E890 Postprocedural hypothyroidism: Secondary | ICD-10-CM

## 2016-11-04 LAB — T4, FREE: Free T4: 1.22 ng/dL (ref 0.60–1.60)

## 2016-11-04 LAB — CBC WITH DIFFERENTIAL/PLATELET
BASOS ABS: 0.1 10*3/uL (ref 0.0–0.1)
Basophils Relative: 0.8 % (ref 0.0–3.0)
EOS PCT: 2.6 % (ref 0.0–5.0)
Eosinophils Absolute: 0.2 10*3/uL (ref 0.0–0.7)
HEMATOCRIT: 43.6 % (ref 39.0–52.0)
Hemoglobin: 14.8 g/dL (ref 13.0–17.0)
LYMPHS ABS: 1.6 10*3/uL (ref 0.7–4.0)
LYMPHS PCT: 21.8 % (ref 12.0–46.0)
MCHC: 33.9 g/dL (ref 30.0–36.0)
MCV: 90.1 fl (ref 78.0–100.0)
MONOS PCT: 8.1 % (ref 3.0–12.0)
Monocytes Absolute: 0.6 10*3/uL (ref 0.1–1.0)
NEUTROS ABS: 4.9 10*3/uL (ref 1.4–7.7)
Neutrophils Relative %: 66.7 % (ref 43.0–77.0)
PLATELETS: 293 10*3/uL (ref 150.0–400.0)
RBC: 4.84 Mil/uL (ref 4.22–5.81)
RDW: 13.6 % (ref 11.5–15.5)
WBC: 7.4 10*3/uL (ref 4.0–10.5)

## 2016-11-04 LAB — LIPID PANEL
CHOL/HDL RATIO: 5
Cholesterol: 172 mg/dL (ref 0–200)
HDL: 33.5 mg/dL — AB (ref >39.00)
LDL CALC: 113 mg/dL — AB (ref 0–99)
NONHDL: 138.03
Triglycerides: 124 mg/dL (ref 0.0–149.0)
VLDL: 24.8 mg/dL (ref 0.0–40.0)

## 2016-11-04 LAB — BASIC METABOLIC PANEL
BUN: 24 mg/dL — ABNORMAL HIGH (ref 6–23)
CALCIUM: 9.8 mg/dL (ref 8.4–10.5)
CO2: 28 meq/L (ref 19–32)
CREATININE: 1.22 mg/dL (ref 0.40–1.50)
Chloride: 103 mEq/L (ref 96–112)
GFR: 61.02 mL/min (ref >60.00)
Glucose, Bld: 192 mg/dL — ABNORMAL HIGH (ref 70–99)
Potassium: 4 mEq/L (ref 3.5–5.1)
SODIUM: 139 meq/L (ref 135–145)

## 2016-11-04 LAB — URINALYSIS, ROUTINE W REFLEX MICROSCOPIC
BILIRUBIN URINE: NEGATIVE
Hgb urine dipstick: NEGATIVE
KETONES UR: NEGATIVE
LEUKOCYTES UA: NEGATIVE
Nitrite: NEGATIVE
RBC / HPF: NONE SEEN (ref 0–?)
Specific Gravity, Urine: >=1.03 — AB (ref 1.000–1.030)
Total Protein, Urine: NEGATIVE
UROBILINOGEN UA: 1 (ref 0.0–1.0)
Urine Glucose: 250 — AB
WBC UA: NONE SEEN (ref 0–?)
pH: 5.5 (ref 5.0–8.0)

## 2016-11-04 LAB — HEPATIC FUNCTION PANEL
ALK PHOS: 53 U/L (ref 39–117)
ALT: 17 U/L (ref 0–53)
AST: 14 U/L (ref 0–37)
Albumin: 4.2 g/dL (ref 3.5–5.2)
BILIRUBIN DIRECT: 0.2 mg/dL (ref 0.0–0.3)
TOTAL PROTEIN: 6.6 g/dL (ref 6.0–8.3)
Total Bilirubin: 1.1 mg/dL (ref 0.2–1.2)

## 2016-11-04 LAB — HEMOGLOBIN A1C: Hgb A1c MFr Bld: 7 % — ABNORMAL HIGH (ref 4.6–6.5)

## 2016-11-04 LAB — MICROALBUMIN / CREATININE URINE RATIO
Creatinine,U: 138.9 mg/dL
MICROALB UR: 1.1 mg/dL (ref 0.0–1.9)
Microalb Creat Ratio: 0.8 mg/g (ref 0.0–30.0)

## 2016-11-04 LAB — PSA: PSA: 4.98 ng/mL — AB (ref 0.10–4.00)

## 2016-11-04 LAB — TSH: TSH: 1.38 u[IU]/mL (ref 0.35–4.50)

## 2016-11-04 MED ORDER — GLUCOSE BLOOD VI STRP: ORAL_STRIP | 12 refills | Status: DC

## 2016-11-04 NOTE — Assessment & Plan Note (Signed)
.   Lab Results  Component Value Date   LDLCALC 112 (H) 05/06/2016   Statin intolerant, for lower chol diet

## 2016-11-04 NOTE — Assessment & Plan Note (Addendum)
stable overall by history and exam, has lost some wt, did not take the glipizde ER due to some mixup , recent data reviewed with pt, and pt to continue medical treatment as before,  to f/u any worsening symptoms or concerns, for f/u lab today  Lab Results  Component Value Date   HGBA1C 7.9 (H) 05/06/2016

## 2016-11-04 NOTE — Assessment & Plan Note (Signed)
stable overall by history and exam, recent data reviewed with pt, and pt to continue medical treatment as before,  to f/u any worsening symptoms or concerns, for fu lab and [facility] - ? Recurrent thyroid malignancy

## 2016-11-04 NOTE — Patient Instructions (Addendum)
You had the flu shot today  Please make appt with Dr Raeford Razor at the checkout for the left knee.   Please continue all other medications as before, and refills have been done if requested.  Please have the pharmacy call with any other refills you may need.  Please continue your efforts at being more active, low cholesterol diet, and weight control.  You are otherwise up to date with prevention measures today.  Please keep your appointments with your specialists as you may have planned  Please go to the LAB in the Basement (turn left off the elevator) for the tests to be done today  You will be contacted by phone if any changes need to be made immediately.  Otherwise, you will receive a letter about your results with an explanation, but please check with MyChart first.  Please remember to sign up for MyChart if you have not done so, as this will be important to you in the future with finding out test results, communicating by private email, and scheduling acute appointments online when needed.  Please return in 6 months, or sooner if needed

## 2016-11-04 NOTE — Progress Notes (Signed)
Subjective:    Patient ID: Nathan Russo, male    DOB: [birth year], 76 y.o.   MRN: [id number]  HPI Here for yearly f/u;  Overall doing ok;  Pt denies Chest pain, worsening SOB, DOE, wheezing, orthopnea, PND, worsening LE edema, palpitations, dizziness or syncope. Seems stable few months on flecainide and diltiazem.  Pt denies neurological change such as new headache, facial or extremity weakness.  Pt denies polydipsia, polyuria, or low sugar symptoms. Pt states overall good compliance with treatment and medications, good tolerability, and has been trying to follow appropriate diet.  Pt denies worsening depressive symptoms, suicidal ideation or panic. No fever, night sweats, wt loss, loss of appetite, or other constitutional symptoms.  Pt states good ability with ADL's, has low fall risk, home safety reviewed and adequate, no other significant changes in hearing or vision, and only occasionally active with exercise. Left leg keeps holding him back.  Could not tolerate the recumbant bike.  Walks with cane to help the left knee.  Tried flexogenics but did not hellp.   Lost 6 lbs with better diet. Did not start the glipizide ER 2.5 mg.  Statin intolerant.  BP at home are 117-124 per pt, checks regularly Wt Readings from Last 3 Encounters:  11/04/16 238 lb (108 kg)  05/21/16 244 lb (110.7 kg)  05/06/16 246 lb (111.6 kg)   BP Readings from Last 3 Encounters:  11/04/16 (!) 144/80  05/21/16 (!) 150/85  05/06/16 128/72  Did have fall in the yard, seeing Dr Theda Sers with right shoulder injury, gettting PT, may eventually needs surgury.  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  Also mentions he has follow up appt [facility] Dr Owens Shark after "deep ultrasound" for neck to assess for malignancy recurrence. Denies hyper or hypo thyroid symptoms such as voice, skin or hair change. Past Medical History:  Diagnosis Date  . Abdominal pain, epigastric 04/11/2010  . BELCHING 04/23/2010  . BRADYCARDIA, CHRONIC 10/24/2006  . CARPAL  TUNNEL SYNDROME, BILATERAL 02/07/2009  . CHEST PAIN-UNSPECIFIED 09/05/2008  . CHOLELITHIASIS 04/22/2010  . Cholelithiasis 06/13/2010  . COLONIC POLYPS, HX OF 04/28/2007  . DEGENERATIVE JOINT DISEASE, RIGHT KNEE 10/24/2006  . Depression 02/23/2011  . DIABETES MELLITUS, TYPE II 04/28/2007  . Dizziness and giddiness 12/19/2009  . Elevated PSA 06/13/2010  . FATIGUE 04/28/2007  . GERD 02/11/2009  . Headache(784.0) 12/19/2009  . HYPERLIPIDEMIA 04/28/2007  . HYPERTENSION 10/21/2006  . NECK MASS 02/07/2010  . OBESITY 10/24/2006  . OTITIS MEDIA, ACUTE, LEFT 12/26/2009  . PHIMOSIS 02/07/2009  . S/P laparoscopic cholecystectomy 10/31/2010  . Thyroid cancer ([place]) 06/13/2010  . THYROID NODULE 02/07/2010   Past Surgical History:  Procedure Laterality Date  . CHOLECYSTECTOMY    . left knee surgery    . right wrist surgury    . THYROID SURGERY      reports that he has quit smoking. He has never used smokeless tobacco. He reports that he does not drink alcohol or use drugs. family history includes Arthritis in his mother; Cancer in his brother and sister; Colon cancer (age of onset: 66) in his brother; Diabetes in his brother; Goiter in his mother; Heart attack (age of onset: 79) in his brother; Heart attack (age of onset: 49) in his father; Hypertension in his mother; Hypothyroidism in his sister. Allergies  Allergen Reactions  . Ace Inhibitors     REACTION: cough  . Atenolol     REACTION: bradycardia  . Codeine Other (See Comments)    Head  spins "wild"  . Levaquin [Levofloxacin]     Interferes with flecainide  . Lovastatin     REACTION: myalygros  . Morphine Nausea And Vomiting  . Morphine And Related   . Statins   . Sulfa Antibiotics Other (See Comments)    As child almost died  . Sulfamethoxazole Other (See Comments)    Childhood unknown reaction  . Tramadol     Ants crawling all over   Current Outpatient Prescriptions on File Prior to Visit  Medication Sig Dispense Refill  . amLODipine  (NORVASC) 5 MG tablet Take 0.5 tablets (2.5 mg total) by mouth daily. 45 tablet 3  . apixaban (ELIQUIS) 5 MG TABS tablet Take by mouth 2 (two) times daily.    . Ascorbic Acid (VITAMIN C) 1000 MG tablet Take 1,000 mg by mouth daily.      . blood glucose meter kit and supplies KIT Dispense based on patient and insurance preference. Use up to four times daily as directed. (FOR ICD-9 250.00, 250.01). 1 each 0  . Calcium 500-125 MG-UNIT TABS 1 tablet daily.    . calcium carbonate (OS-CAL) 600 MG TABS Take 600 mg by mouth daily.      . Cinnamon 500 MG capsule Take 500 mg by mouth 2 (two) times daily.      [name] diltiazem (CARTIA XT) 120 MG 24 hr capsule Take 1 capsule (120 mg total) by mouth 2 (two) times daily. 180 capsule 3  . Flaxseed, Linseed, 1000 MG CAPS Take 1 capsule by mouth daily.    . flecainide (TAMBOCOR) 100 MG tablet Take 100 mg by mouth 2 (two) times daily.    . Garlic Oil (ODORLESS GARLIC) 355 MG TABS Take by mouth 2 (two) times daily.      . Ginger, Zingiber officinalis, (GINGER PO) Take by mouth 4 (four) times daily.      . Glucosamine-Chondroit-Vit C-Mn (GLUCOSAMINE CHONDROITIN COMPLX) CAPS Take by mouth daily.      [name] levothyroxine (SYNTHROID, LEVOTHROID) 125 MCG tablet TAKE 1 TABLET ONE TIME DAILY BEFORE BREAKFAST 90 tablet 3  . losartan (COZAAR) 100 MG tablet Take 1 tablet (100 mg total) by mouth daily. 90 tablet 1  . metFORMIN (GLUCOPHAGE-XR) 500 MG 24 hr tablet 4 tabs by mouth per day 360 tablet 3  . Misc Natural Products (OSTEO BI-FLEX TRIPLE STRENGTH) TABS Take 1 tablet by mouth.    . Multiple Vitamin (MULTIVITAMIN) capsule Take 1 capsule by mouth daily.      . Omega-3 Fatty Acids (FISH OIL) 1000 MG CAPS Take by mouth 2 (two) times daily.      [name] omeprazole (PRILOSEC) 20 MG capsule Take 1 capsule (20 mg total) by mouth 2 (two) times daily. 180 capsule 3  . triamcinolone (KENALOG) 0.025 % cream Apply 1 application topically 2 (two) times daily.    . Triamcinolone Acetonide  (TRIAMCINOLONE 0.1 % CREAM : EUCERIN) CREA Apply 1 application topically.    [name] VITAMIN D, CHOLECALCIFEROL, PO Take by mouth daily.       No current facility-administered medications on file prior to visit.    Review of Systems  Constitutional: Negative for other unusual diaphoresis or sweats HENT: Negative for ear discharge or swelling Eyes: Negative for other worsening visual disturbances Respiratory: Negative for stridor or other swelling  Gastrointestinal: Negative for worsening distension or other blood Genitourinary: Negative for retention or other urinary change Musculoskeletal: Negative for other MSK pain or swelling Skin: Negative for color change or other new lesions Neurological:  Negative for worsening tremors and other numbness  Psychiatric/Behavioral: Negative for worsening agitation or other fatigue All other system neg per pt    Objective:   Physical Exam BP (!) 144/80   Pulse 60   Temp 97.9 F (36.6 C) (Oral)   Ht 6' (1.829 m)   Wt 238 lb (108 kg)   SpO2 98%   BMI 32.28 kg/m  VS noted, obese Constitutional: Pt appears in NAD HENT: Head: NCAT.  Right Ear: External ear normal.  Left Ear: External ear normal.  Eyes: . Pupils are equal, round, and reactive to light. Conjunctivae and EOM are normal Nose: without d/c or deformity Neck: Neck supple. Gross normal ROM Has ? 1 cm nodular subq mass just below angle of jaw Cardiovascular: Normal rate and regular rhythm.   Pulmonary/Chest: Effort normal and breath sounds without rales or wheezing.  Abd:  Soft, NT, ND, + BS, no organomegaly Neurological: Pt is alert. At baseline orientation, motor grossly intact Skin: Skin is warm. No rashes, other new lesions, no LE edema Psychiatric: Pt behavior is normal without agitation  No other exam changes Lab Results  Component Value Date   WBC 8.5 11/07/2015   HGB 15.4 11/07/2015   HCT 44.7 11/07/2015   PLT 322.0 11/07/2015   GLUCOSE 183 (H) 05/06/2016   CHOL 188  05/06/2016   TRIG 189.0 (H) 05/06/2016   HDL 38.00 (L) 05/06/2016   LDLDIRECT 113.0 11/07/2015   LDLCALC 112 (H) 05/06/2016   ALT 27 05/06/2016   AST 20 05/06/2016   NA 140 05/06/2016   K 4.3 05/06/2016   CL 104 05/06/2016   CREATININE 1.32 05/06/2016   BUN 18 05/06/2016   CO2 27 05/06/2016   TSH 1.51 11/07/2015   PSA 3.17 11/07/2015   INR 1.0 08/22/2008   HGBA1C 7.9 (H) 05/06/2016   MICROALBUR <0.7 11/07/2015         Assessment & Plan:

## 2016-11-04 NOTE — Assessment & Plan Note (Signed)
stable overall by history and exam, recent data reviewed with pt, and pt to continue medical treatment as before,  to f/u any worsening symptoms or concerns  

## 2016-11-05 ENCOUNTER — Telehealth: Payer: Self-pay

## 2016-11-05 NOTE — Telephone Encounter (Signed)
[name], [facility] [place].

## 2016-11-05 NOTE — Telephone Encounter (Signed)
-----   Message from Biagio Borg, MD sent at 11/04/2016  8:54 PM EDT ----- Left message on MyChart, pt to cont same tx except  The test results show that your current treatment is OK, except the PSA is higher than the previous.  This may or may not be significant, but we will need to repeat the test in 6 months.  I could refer you now to Urology, however, if you like.    Shirron to please inform pt, I will place for order for test, which should be done with his other routine tests already ordered in 6 mo

## 2016-11-05 NOTE — Telephone Encounter (Signed)
Called pt, LVM.   

## 2016-11-05 NOTE — Telephone Encounter (Signed)
Patient states he already has a urologist.  States he is a patient of Dr. Gaynelle Arabian.  Would like labs sent to [facility].  Patient states he will call [facility] to set up an appointment to follow up on his PSA.

## 2016-11-05 NOTE — Telephone Encounter (Signed)
Ok for shirron to forward labs, thanks

## 2016-11-06 DIAGNOSIS — N401 Enlarged prostate with lower urinary tract symptoms: Secondary | ICD-10-CM | POA: Diagnosis not present

## 2016-11-06 DIAGNOSIS — R972 Elevated prostate specific antigen [PSA]: Secondary | ICD-10-CM | POA: Diagnosis not present

## 2016-11-06 DIAGNOSIS — R350 Frequency of micturition: Secondary | ICD-10-CM | POA: Diagnosis not present

## 2016-11-11 ENCOUNTER — Other Ambulatory Visit: Payer: Self-pay | Admitting: Internal Medicine

## 2016-11-12 ENCOUNTER — Other Ambulatory Visit: Payer: Self-pay | Admitting: Internal Medicine

## 2016-11-13 DIAGNOSIS — M75101 Unspecified rotator cuff tear or rupture of right shoulder, not specified as traumatic: Secondary | ICD-10-CM | POA: Diagnosis not present

## 2016-11-13 DIAGNOSIS — M7541 Impingement syndrome of right shoulder: Secondary | ICD-10-CM | POA: Diagnosis not present

## 2016-11-18 DIAGNOSIS — M7541 Impingement syndrome of right shoulder: Secondary | ICD-10-CM | POA: Diagnosis not present

## 2016-11-18 DIAGNOSIS — M75101 Unspecified rotator cuff tear or rupture of right shoulder, not specified as traumatic: Secondary | ICD-10-CM | POA: Diagnosis not present

## 2016-11-25 ENCOUNTER — Ambulatory Visit: Payer: Medicare Other | Admitting: Family Medicine

## 2016-11-28 DIAGNOSIS — D224 Melanocytic nevi of scalp and neck: Secondary | ICD-10-CM | POA: Diagnosis not present

## 2016-11-28 DIAGNOSIS — L821 Other seborrheic keratosis: Secondary | ICD-10-CM | POA: Diagnosis not present

## 2016-11-28 DIAGNOSIS — Z23 Encounter for immunization: Secondary | ICD-10-CM | POA: Diagnosis not present

## 2016-11-28 DIAGNOSIS — Z85828 Personal history of other malignant neoplasm of skin: Secondary | ICD-10-CM | POA: Diagnosis not present

## 2016-11-28 DIAGNOSIS — L570 Actinic keratosis: Secondary | ICD-10-CM | POA: Diagnosis not present

## 2016-12-24 DIAGNOSIS — Z08 Encounter for follow-up examination after completed treatment for malignant neoplasm: Secondary | ICD-10-CM | POA: Diagnosis not present

## 2016-12-24 DIAGNOSIS — H60543 Acute eczematoid otitis externa, bilateral: Secondary | ICD-10-CM | POA: Diagnosis not present

## 2016-12-24 DIAGNOSIS — L309 Dermatitis, unspecified: Secondary | ICD-10-CM | POA: Diagnosis not present

## 2016-12-24 DIAGNOSIS — Z9089 Acquired absence of other organs: Secondary | ICD-10-CM | POA: Diagnosis not present

## 2016-12-24 DIAGNOSIS — E890 Postprocedural hypothyroidism: Secondary | ICD-10-CM | POA: Diagnosis not present

## 2016-12-24 DIAGNOSIS — Z85850 Personal history of malignant neoplasm of thyroid: Secondary | ICD-10-CM | POA: Diagnosis not present

## 2016-12-31 ENCOUNTER — Ambulatory Visit (INDEPENDENT_AMBULATORY_CARE_PROVIDER_SITE_OTHER): Payer: Medicare Other | Admitting: Sports Medicine

## 2016-12-31 DIAGNOSIS — M79676 Pain in unspecified toe(s): Secondary | ICD-10-CM | POA: Diagnosis not present

## 2016-12-31 DIAGNOSIS — E1140 Type 2 diabetes mellitus with diabetic neuropathy, unspecified: Secondary | ICD-10-CM

## 2016-12-31 DIAGNOSIS — I739 Peripheral vascular disease, unspecified: Secondary | ICD-10-CM

## 2016-12-31 DIAGNOSIS — B351 Tinea unguium: Secondary | ICD-10-CM

## 2016-12-31 NOTE — Progress Notes (Signed)
Subjective: Nathan Russo is a 76 y.o. male patient with history of diabetes who returns to office today complaining of long, painful nails  while ambulating in shoes; unable to trim. Patient states that the glucose readings have been good. Patient denies any new changes in medication or new problems since last visit.   States he is still dealing with issues of his Afib, on eliquis.  Patient Active Problem List   Diagnosis Date Noted  . Leg abscess 03/04/2016  . Right leg pain 02/29/2016  . Cough 02/23/2016  . PAT (paroxysmal atrial tachycardia) ([place]) 09/07/2015  . Abnormal breath sounds 01/11/2015  . Otitis externa 08/02/2014  . Hypersomnia 09/14/2013  . Dyspnea on exertion 04/12/2013  . Hypotension 04/12/2013  . Community acquired pneumonia 04/06/2013  . Status post ablation of atrial fibrillation 03/17/2013  . Status post ablation of atrial flutter 03/17/2013  . Postoperative hypothyroidism 05/05/2012  . Papillary carcinoma of thyroid ([place]) 05/05/2012  . Essential hypertension 03/28/2012  . Atrial fibrillation and flutter ([place]) 03/27/2012  . Pain in wrist 09/03/2011  . Depression 02/23/2011  . Vertigo 02/23/2011  . Supraventricular arrhythmia 02/06/2011  . Chronic pain 12/13/2010  . History of cholecystectomy 10/31/2010  . Encounter for general adult medical examination without abnormal findings 10/31/2010  . Postoperative state 10/31/2010  . Routine history and physical examination of adult 10/31/2010  . Bladder neck obstruction 06/13/2010  . Elevated prostate specific antigen (PSA) 06/13/2010  . Gastroesophageal reflux disease 02/11/2009  . CARPAL TUNNEL SYNDROME, BILATERAL 02/07/2009  . Diabetes mellitus ([place]) 04/28/2007  . Hyperlipidemia 04/28/2007  . Malaise and fatigue 04/28/2007  . History of colonic polyps 04/28/2007  . Adiposity 10/24/2006  . BRADYCARDIA, CHRONIC 10/24/2006  . Osteoarthritis 10/24/2006  . Cardiac arrhythmia 10/24/2006   Current Outpatient  Prescriptions on File Prior to Visit  Medication Sig Dispense Refill  . apixaban (ELIQUIS) 5 MG TABS tablet Take by mouth 2 (two) times daily.    . Ascorbic Acid (VITAMIN C) 1000 MG tablet Take 1,000 mg by mouth daily.      . blood glucose meter kit and supplies KIT Dispense based on patient and insurance preference. Use up to four times daily as directed. (FOR ICD-9 250.00, 250.01). 1 each 0  . Calcium 500-125 MG-UNIT TABS 1 tablet daily.    . calcium carbonate (OS-CAL) 600 MG TABS Take 600 mg by mouth daily.      . Cinnamon 500 MG capsule Take 500 mg by mouth 2 (two) times daily.      [name] diltiazem (CARTIA XT) 120 MG 24 hr capsule Take 1 capsule (120 mg total) by mouth 2 (two) times daily. 180 capsule 3  . Flaxseed, Linseed, 1000 MG CAPS Take 1 capsule by mouth daily.    . flecainide (TAMBOCOR) 100 MG tablet Take 100 mg by mouth 2 (two) times daily.    . Garlic Oil (ODORLESS GARLIC) 081 MG TABS Take by mouth 2 (two) times daily.      . Ginger, Zingiber officinalis, (GINGER PO) Take by mouth 4 (four) times daily.      . Glucosamine-Chondroit-Vit C-Mn (GLUCOSAMINE CHONDROITIN COMPLX) CAPS Take by mouth daily.      [name] glucose blood test strip Check glucose 2-3 times a day. E11.40 100 each 12  . levothyroxine (SYNTHROID, LEVOTHROID) 125 MCG tablet TAKE 1 TABLET ONE TIME DAILY BEFORE BREAKFAST 90 tablet 3  . losartan (COZAAR) 100 MG tablet Take 1 tablet (100 mg total) by mouth daily. 90 tablet 1  .  metFORMIN (GLUCOPHAGE-XR) 500 MG 24 hr tablet TAKE 4 TABLETS EVERY DAY 360 tablet 3  . Misc Natural Products (OSTEO BI-FLEX TRIPLE STRENGTH) TABS Take 1 tablet by mouth.    . Multiple Vitamin (MULTIVITAMIN) capsule Take 1 capsule by mouth daily.      . Omega-3 Fatty Acids (FISH OIL) 1000 MG CAPS Take by mouth 2 (two) times daily.      [name] omeprazole (PRILOSEC) 20 MG capsule TAKE 1 CAPSULE TWICE DAILY 180 capsule 3  . triamcinolone (KENALOG) 0.025 % cream Apply 1 application topically 2 (two) times daily.     . Triamcinolone Acetonide (TRIAMCINOLONE 0.1 % CREAM : EUCERIN) CREA Apply 1 application topically.    [name] VITAMIN D, CHOLECALCIFEROL, PO Take by mouth daily.       No current facility-administered medications on file prior to visit.    Allergies  Allergen Reactions  . Ace Inhibitors     REACTION: cough  . Atenolol     REACTION: bradycardia  . Codeine Other (See Comments)    Head spins "wild"  . Levaquin [Levofloxacin]     Interferes with flecainide  . Lovastatin     REACTION: myalygros  . Morphine Nausea And Vomiting  . Morphine And Related   . Statins   . Sulfa Antibiotics Other (See Comments)    As child almost died  . Sulfamethoxazole Other (See Comments)    Childhood unknown reaction  . Tramadol     Ants crawling all over    Recent Results (from the past 2160 hour(s))  Microalbumin / creatinine urine ratio     Status: None   Collection Time: 11/04/16  8:58 AM  Result Value Ref Range   Microalb, Ur 1.1 0.0 - 1.9 mg/dL   Creatinine,U 138.9 mg/dL   Microalb Creat Ratio 0.8 0.0 - 30.0 mg/g  Hemoglobin A1c     Status: Abnormal   Collection Time: 11/04/16  8:58 AM  Result Value Ref Range   Hgb A1c MFr Bld 7.0 (H) 4.6 - 6.5 %    Comment: Glycemic Control Guidelines for People with Diabetes:Non Diabetic:  <6%Goal of Therapy: <7%Additional Action Suggested:  >8%   Lipid panel     Status: Abnormal   Collection Time: 11/04/16  8:58 AM  Result Value Ref Range   Cholesterol 172 0 - 200 mg/dL    Comment: ATP III Classification       Desirable:  < 200 mg/dL               Borderline High:  200 - 239 mg/dL          High:  > = 240 mg/dL   Triglycerides 124.0 0.0 - 149.0 mg/dL    Comment: Normal:  <150 mg/dLBorderline High:  150 - 199 mg/dL   HDL 33.50 (L) >39.00 mg/dL   VLDL 24.8 0.0 - 40.0 mg/dL   LDL Cholesterol 113 (H) 0 - 99 mg/dL   Total CHOL/HDL Ratio 5     Comment:                Men          Women1/2 Average Risk     3.4          3.3Average Risk          5.0           4.42X Average Risk          9.6          7.13X Average  Risk          15.0          11.0                       NonHDL 138.03     Comment: NOTE:  Non-HDL goal should be 30 mg/dL higher than patient's LDL goal (i.e. LDL goal of < 70 mg/dL, would have non-HDL goal of < 100 mg/dL)  Basic metabolic panel     Status: Abnormal   Collection Time: 11/04/16  8:58 AM  Result Value Ref Range   Sodium 139 135 - 145 mEq/L   Potassium 4.0 3.5 - 5.1 mEq/L   Chloride 103 96 - 112 mEq/L   CO2 28 19 - 32 mEq/L   Glucose, Bld 192 (H) 70 - 99 mg/dL   BUN 24 (H) 6 - 23 mg/dL   Creatinine, Ser 1.22 0.40 - 1.50 mg/dL   Calcium 9.8 8.4 - 10.5 mg/dL   GFR 61.02 >60.00 mL/min  Hepatic function panel     Status: None   Collection Time: 11/04/16  8:58 AM  Result Value Ref Range   Total Bilirubin 1.1 0.2 - 1.2 mg/dL   Bilirubin, Direct 0.2 0.0 - 0.3 mg/dL   Alkaline Phosphatase 53 39 - 117 U/L   AST 14 0 - 37 U/L   ALT 17 0 - 53 U/L   Total Protein 6.6 6.0 - 8.3 g/dL   Albumin 4.2 3.5 - 5.2 g/dL  CBC with Differential/Platelet     Status: None   Collection Time: 11/04/16  8:58 AM  Result Value Ref Range   WBC 7.4 4.0 - 10.5 K/uL   RBC 4.84 4.22 - 5.81 Mil/uL   Hemoglobin 14.8 13.0 - 17.0 g/dL   HCT 43.6 39.0 - 52.0 %   MCV 90.1 78.0 - 100.0 fl   MCHC 33.9 30.0 - 36.0 g/dL   RDW 13.6 11.5 - 15.5 %   Platelets 293.0 150.0 - 400.0 K/uL   Neutrophils Relative % 66.7 43.0 - 77.0 %   Lymphocytes Relative 21.8 12.0 - 46.0 %   Monocytes Relative 8.1 3.0 - 12.0 %   Eosinophils Relative 2.6 0.0 - 5.0 %   Basophils Relative 0.8 0.0 - 3.0 %   Neutro Abs 4.9 1.4 - 7.7 K/uL   Lymphs Abs 1.6 0.7 - 4.0 K/uL   Monocytes Absolute 0.6 0.1 - 1.0 K/uL   Eosinophils Absolute 0.2 0.0 - 0.7 K/uL   Basophils Absolute 0.1 0.0 - 0.1 K/uL  TSH     Status: None   Collection Time: 11/04/16  8:58 AM  Result Value Ref Range   TSH 1.38 0.35 - 4.50 uIU/mL  Urinalysis, Routine w reflex microscopic     Status: Abnormal   Collection  Time: 11/04/16  8:58 AM  Result Value Ref Range   Color, Urine YELLOW Yellow;Lt. Yellow   APPearance CLEAR Clear   Specific Gravity, Urine >=1.030 (A) 1.000 - 1.030   pH 5.5 5.0 - 8.0   Total Protein, Urine NEGATIVE Negative   Urine Glucose 250 (A) Negative   Ketones, ur NEGATIVE Negative   Bilirubin Urine NEGATIVE Negative   Hgb urine dipstick NEGATIVE Negative   Urobilinogen, UA 1.0 0.0 - 1.0   Leukocytes, UA NEGATIVE Negative   Nitrite NEGATIVE Negative   WBC, UA none seen 0-2/hpf   RBC / HPF none seen 0-2/hpf   Uric Acid Crys, UA Presence of (A) None  PSA  Status: Abnormal   Collection Time: 11/04/16  8:58 AM  Result Value Ref Range   PSA 4.98 (H) 0.10 - 4.00 ng/mL    Comment: Test performed using Access Hybritech PSA Assay, a parmagnetic partical, chemiluminecent immunoassay.  T4, free     Status: None   Collection Time: 11/04/16  8:58 AM  Result Value Ref Range   Free T4 1.22 0.60 - 1.60 ng/dL    Comment: Specimens from patients who are undergoing biotin therapy and /or ingesting biotin supplements may contain high levels of biotin.  The higher biotin concentration in these specimens interferes with this Free T4 assay.  Specimens that contain high levels  of biotin may cause false high results for this Free T4 assay.  Please interpret results in light of the total clinical presentation of the patient.      Objective: General: Patient is awake, alert, and oriented x 3 and in no acute distress.  Integument: Skin is warm, dry and supple bilateral. Nails are tender, long, thickened and dystrophic with subungual debris, consistent with onychomycosis, 1-5 bilateral. No signs of infection. No open lesions or preulcerative lesions present bilateral. Remaining integument unremarkable.  Vasculature:  Dorsalis Pedis pulse 1/4 bilateral. Posterior Tibial pulse  1/4 bilateral. Capillary fill time <5 sec 1-5 bilateral. Scant hair growth to the level of the digits.Temperature gradient  within normal limits. Mild varicosities present bilateral. No edema present bilateral.   Neurology: The patient has intact sensation measured with a 5.07/10g Semmes Weinstein Monofilament at all pedal sites bilateral . Vibratory sensation diminished bilateral with tuning fork. No Babinski sign present bilateral.   Musculoskeletal: No symptomatic pedal deformities noted bilateral. Muscular strength 5/5 in all lower extremity muscular groups bilateral without pain on range of motion . No tenderness with calf compression bilateral.  Assessment and Plan: Problem List Items Addressed This Visit      Endocrine   Diabetes mellitus ([place])    Other Visit Diagnoses    Pain due to onychomycosis of toenail    -  Primary   PVD (peripheral vascular disease) ([place])          -Examined patient. -Discussed and educated patient on diabetic foot care, especially with  regards to the vascular, neurological and musculoskeletal systems.  -Stressed the importance of good glycemic control and the detriment of not controlling glucose levels in relation to the foot. -Mechanically debrided all nails 1-5 bilateral using sterile nail nipper and filed with dremel without incident  -Answered all patient questions -Patient to return  in 3 months for at risk foot care -Patient advised to call the office if any problems or questions arise in the meantime.  Landis Martins, DPM

## 2017-01-05 ENCOUNTER — Telehealth: Payer: Self-pay | Admitting: Internal Medicine

## 2017-01-05 MED ORDER — ROSUVASTATIN CALCIUM 10 MG PO TABS: 10.0000 mg | ORAL_TABLET | Freq: Every day | ORAL | 3 refills | Status: DC

## 2017-01-05 NOTE — Telephone Encounter (Signed)
Ok for crestor 10 mg trial - done erx

## 2017-01-05 NOTE — Telephone Encounter (Signed)
Patient states [name] had been wanting him to start a statin.  Patient has seen wife take crestor and has been able to tolerate it.  Patient would like to know if [name] would send a 90 day script of crestor over to [facility] mail order for himself?

## 2017-01-06 NOTE — Telephone Encounter (Signed)
Patient advised.

## 2017-02-06 DIAGNOSIS — R972 Elevated prostate specific antigen [PSA]: Secondary | ICD-10-CM | POA: Diagnosis not present

## 2017-02-12 DIAGNOSIS — Z23 Encounter for immunization: Secondary | ICD-10-CM | POA: Diagnosis not present

## 2017-02-12 DIAGNOSIS — S50812A Abrasion of left forearm, initial encounter: Secondary | ICD-10-CM | POA: Diagnosis not present

## 2017-02-18 ENCOUNTER — Telehealth: Payer: Self-pay | Admitting: Internal Medicine

## 2017-02-18 MED ORDER — EZETIMIBE 10 MG PO TABS: 10.0000 mg | ORAL_TABLET | Freq: Every day | ORAL | 3 refills | Status: DC

## 2017-02-18 MED ORDER — ROSUVASTATIN CALCIUM 10 MG PO TABS: 10.0000 mg | ORAL_TABLET | ORAL | 3 refills | Status: DC

## 2017-02-18 NOTE — Telephone Encounter (Signed)
This is a common issue, but we have had some good luck with cutting back on the crestor to QOD and also add the zetia as well  Ok to take the crestor qod, and I have added the zetia

## 2017-02-18 NOTE — Telephone Encounter (Signed)
Copied from [place] [id number]. Topic: General - Other >> Feb 18, 2017  8:56 AM Darl Householder, RMA wrote: Reason for CRM: Patient is requesting a call back from [name] or CMA concerning medication crestor   >> Feb 18, 2017 11:36 AM Cleaster Corin, NT wrote: Pt. Calling back wanting to speak with Dr.John or CMA about med. Crestor. Please give pt. A call when able. Wants to possibly change med.

## 2017-02-18 NOTE — Telephone Encounter (Signed)
Copied from [place] [id number]. Topic: General - Other >> Feb 18, 2017  8:56 AM Darl Householder, RMA wrote: Reason for CRM: Patient is requesting a call back from [name] or CMA concerning medication crestor

## 2017-02-18 NOTE — Telephone Encounter (Signed)
Pt stated that he has been having joint pain and fatigue while taking Crestor. He would like to try the generic Zetia. Please advise.   Patients wife is also having fatigue with Crestor and would like to try generic Zetia also since she has had a stroke. Her info below.  Nathan Russo DOB [birth year]

## 2017-02-18 NOTE — Telephone Encounter (Signed)
Copied from [place] [id number]. Topic: General - Other >> Feb 18, 2017  8:56 AM Darl Householder, RMA wrote: Reason for CRM: Patient is requesting a call back from [name] or CMA concerning medication crestor   >> Feb 18, 2017 11:36 AM Cleaster Corin, NT wrote: Pt. Calling back wanting to speak with Dr.John or CMA about med. Crestor. Please give pt. A call when able. Wants to possibly change med.

## 2017-02-18 NOTE — Telephone Encounter (Signed)
Sending to CMA to follow up.

## 2017-02-18 NOTE — Addendum Note (Signed)
Addended by: Juliet Rude on: 02/18/2017 03:21 PM   Modules accepted: Orders

## 2017-03-06 ENCOUNTER — Other Ambulatory Visit: Payer: Self-pay | Admitting: Internal Medicine

## 2017-03-23 DIAGNOSIS — B353 Tinea pedis: Secondary | ICD-10-CM | POA: Diagnosis not present

## 2017-03-23 DIAGNOSIS — L301 Dyshidrosis [pompholyx]: Secondary | ICD-10-CM | POA: Diagnosis not present

## 2017-04-08 ENCOUNTER — Ambulatory Visit (INDEPENDENT_AMBULATORY_CARE_PROVIDER_SITE_OTHER): Payer: Medicare Other | Admitting: Sports Medicine

## 2017-04-08 ENCOUNTER — Encounter: Payer: Self-pay | Admitting: Sports Medicine

## 2017-04-08 DIAGNOSIS — M79676 Pain in unspecified toe(s): Secondary | ICD-10-CM

## 2017-04-08 DIAGNOSIS — I739 Peripheral vascular disease, unspecified: Secondary | ICD-10-CM

## 2017-04-08 DIAGNOSIS — B351 Tinea unguium: Secondary | ICD-10-CM | POA: Diagnosis not present

## 2017-04-08 DIAGNOSIS — E1140 Type 2 diabetes mellitus with diabetic neuropathy, unspecified: Secondary | ICD-10-CM

## 2017-04-08 NOTE — Progress Notes (Signed)
Subjective: Nathan Russo is a 77 y.o. male patient with history of diabetes who returns to office today complaining of long, painful nails  while ambulating in shoes; unable to trim. Patient states that the glucose reading was not recorded. Patient denies any new changes in medication or new problems since last visit.   States he saw PCP, [name] 1 month or so ago.   Patient Active Problem List   Diagnosis Date Noted  . Leg abscess 03/04/2016  . Right leg pain 02/29/2016  . Cough 02/23/2016  . PAT (paroxysmal atrial tachycardia) ([place]) 09/07/2015  . Abnormal breath sounds 01/11/2015  . Otitis externa 08/02/2014  . Hypersomnia 09/14/2013  . Dyspnea on exertion 04/12/2013  . Hypotension 04/12/2013  . Community acquired pneumonia 04/06/2013  . Status post ablation of atrial fibrillation 03/17/2013  . Status post ablation of atrial flutter 03/17/2013  . Postoperative hypothyroidism 05/05/2012  . Papillary carcinoma of thyroid ([place]) 05/05/2012  . Essential hypertension 03/28/2012  . Atrial fibrillation and flutter ([place]) 03/27/2012  . Pain in wrist 09/03/2011  . Depression 02/23/2011  . Vertigo 02/23/2011  . Supraventricular arrhythmia 02/06/2011  . Chronic pain 12/13/2010  . History of cholecystectomy 10/31/2010  . Encounter for general adult medical examination without abnormal findings 10/31/2010  . Postoperative state 10/31/2010  . Routine history and physical examination of adult 10/31/2010  . Bladder neck obstruction 06/13/2010  . Elevated prostate specific antigen (PSA) 06/13/2010  . Gastroesophageal reflux disease 02/11/2009  . CARPAL TUNNEL SYNDROME, BILATERAL 02/07/2009  . Diabetes mellitus ([place]) 04/28/2007  . Hyperlipidemia 04/28/2007  . Malaise and fatigue 04/28/2007  . History of colonic polyps 04/28/2007  . Adiposity 10/24/2006  . BRADYCARDIA, CHRONIC 10/24/2006  . Osteoarthritis 10/24/2006  . Cardiac arrhythmia 10/24/2006   Current Outpatient Medications on File  Prior to Visit  Medication Sig Dispense Refill  . amLODipine (NORVASC) 5 MG tablet Take by mouth.    [name] apixaban (ELIQUIS) 5 MG TABS tablet Take by mouth 2 (two) times daily.    . Ascorbic Acid (VITAMIN C) 1000 MG tablet Take 1,000 mg by mouth daily.      . blood glucose meter kit and supplies KIT Dispense based on patient and insurance preference. Use up to four times daily as directed. (FOR ICD-9 250.00, 250.01). 1 each 0  . Calcium 500-125 MG-UNIT TABS 1 tablet daily.    . calcium carbonate (OS-CAL) 600 MG TABS Take 600 mg by mouth daily.      . Cinnamon 500 MG capsule Take 500 mg by mouth 2 (two) times daily.      [name] diltiazem (CARTIA XT) 120 MG 24 hr capsule Take 1 capsule (120 mg total) by mouth 2 (two) times daily. 180 capsule 3  . ezetimibe (ZETIA) 10 MG tablet Take 1 tablet (10 mg total) by mouth daily. 90 tablet 3  . Flaxseed, Linseed, 1000 MG CAPS Take 1 capsule by mouth daily.    . flecainide (TAMBOCOR) 100 MG tablet Take 100 mg by mouth 2 (two) times daily.    . Garlic Oil (ODORLESS GARLIC) 409 MG TABS Take by mouth 2 (two) times daily.      . Ginger, Zingiber officinalis, (GINGER PO) Take by mouth 4 (four) times daily.      . Glucosamine-Chondroit-Vit C-Mn (GLUCOSAMINE CHONDROITIN COMPLX) CAPS Take by mouth daily.      [name] glucose blood test strip Check glucose 2-3 times a Russo. E11.40 100 each 12  . levothyroxine (SYNTHROID, LEVOTHROID) 125 MCG tablet TAKE  1 TABLET ONE TIME DAILY BEFORE BREAKFAST 90 tablet 3  . losartan (COZAAR) 100 MG tablet Take 1 tablet (100 mg total) by mouth daily. 90 tablet 1  . metFORMIN (GLUCOPHAGE-XR) 500 MG 24 hr tablet TAKE 4 TABLETS EVERY Russo 360 tablet 3  . Misc Natural Products (OSTEO BI-FLEX TRIPLE STRENGTH) TABS Take 1 tablet by mouth.    . Multiple Vitamin (MULTIVITAMIN) capsule Take 1 capsule by mouth daily.      . Omega-3 Fatty Acids (FISH OIL) 1000 MG CAPS Take by mouth 2 (two) times daily.      [name] omeprazole (PRILOSEC) 20 MG capsule TAKE 1 CAPSULE  TWICE DAILY 180 capsule 3  . triamcinolone (KENALOG) 0.025 % cream Apply 1 application topically 2 (two) times daily.    . Triamcinolone Acetonide (TRIAMCINOLONE 0.1 % CREAM : EUCERIN) CREA Apply 1 application topically.    [name] VITAMIN D, CHOLECALCIFEROL, PO Take by mouth daily.      . rosuvastatin (CRESTOR) 10 MG tablet Take 1 tablet (10 mg total) by mouth every other Russo. (Patient not taking: Reported on 04/08/2017) 45 tablet 3   No current facility-administered medications on file prior to visit.    Allergies  Allergen Reactions  . Ace Inhibitors     REACTION: cough  . Atenolol     REACTION: bradycardia  . Codeine Other (See Comments)    Head spins "wild"  . Levaquin [Levofloxacin]     Interferes with flecainide  . Lovastatin     REACTION: myalygros  . Morphine Nausea And Vomiting  . Morphine And Related   . Statins   . Sulfa Antibiotics Other (See Comments)    As child almost died  . Sulfamethoxazole Other (See Comments)    Childhood unknown reaction  . Tramadol     Ants crawling all over    No results found for this or any previous visit (from the past 2160 hour(s)).  Objective: General: Patient is awake, alert, and oriented x 3 and in no acute distress.  Integument: Skin is warm, dry and supple bilateral. Nails are tender, long, thickened and dystrophic with subungual debris, consistent with onychomycosis, 1-5 bilateral. No signs of infection. No open lesions or preulcerative lesions present bilateral. Remaining integument unremarkable.  Vasculature:  Dorsalis Pedis pulse 1/4 bilateral. Posterior Tibial pulse  0/4 bilateral due to trace edema at ankles. Capillary fill time <5 sec 1-5 bilateral. Scant hair growth to the level of the digits.Temperature gradient within normal limits. Mild varicosities present bilateral. Trace edema present bilateral.   Neurology: The patient has intact sensation measured with a 5.07/10g Semmes Weinstein Monofilament at all pedal sites  bilateral. Vibratory sensation diminished bilateral with tuning fork. No Babinski sign present bilateral.   Musculoskeletal: No symptomatic pedal deformities noted bilateral. Muscular strength 5/5 in all lower extremity muscular groups bilateral without pain on range of motion . No tenderness with calf compression bilateral.  Assessment and Plan: Problem List Items Addressed This Visit      Endocrine   Diabetes mellitus ([place])    Other Visit Diagnoses    Pain due to onychomycosis of toenail    -  Primary   PVD (peripheral vascular disease) ([place])          -Examined patient. -Discussed and educated patient on diabetic foot care, especially with  regards to the vascular, neurological and musculoskeletal systems.  -Stressed the importance of good glycemic control and the detriment of not controlling glucose levels in relation to the foot. -Mechanically debrided  all nails 1-5 bilateral using sterile nail nipper and filed with dremel without incident  -Encourage elevation to assist with edema control -Answered all patient questions -Patient to return  in 3 months for at risk foot care -Patient advised to call the office if any problems or questions arise in the meantime.  Landis Martins, DPM

## 2017-05-06 ENCOUNTER — Ambulatory Visit: Payer: Medicare Other | Admitting: Internal Medicine

## 2017-05-08 ENCOUNTER — Encounter: Payer: Self-pay | Admitting: Internal Medicine

## 2017-05-08 ENCOUNTER — Ambulatory Visit (INDEPENDENT_AMBULATORY_CARE_PROVIDER_SITE_OTHER): Payer: Medicare Other | Admitting: Internal Medicine

## 2017-05-08 ENCOUNTER — Other Ambulatory Visit (INDEPENDENT_AMBULATORY_CARE_PROVIDER_SITE_OTHER): Payer: Medicare Other

## 2017-05-08 ENCOUNTER — Other Ambulatory Visit: Payer: Self-pay | Admitting: Internal Medicine

## 2017-05-08 VITALS — BP 116/74 | HR 60 | Temp 97.7°F | Ht 72.0 in | Wt 239.0 lb

## 2017-05-08 DIAGNOSIS — I10 Essential (primary) hypertension: Secondary | ICD-10-CM

## 2017-05-08 DIAGNOSIS — E1140 Type 2 diabetes mellitus with diabetic neuropathy, unspecified: Secondary | ICD-10-CM

## 2017-05-08 DIAGNOSIS — E785 Hyperlipidemia, unspecified: Secondary | ICD-10-CM | POA: Diagnosis not present

## 2017-05-08 DIAGNOSIS — R972 Elevated prostate specific antigen [PSA]: Secondary | ICD-10-CM

## 2017-05-08 LAB — LIPID PANEL
CHOL/HDL RATIO: 6
Cholesterol: 193 mg/dL (ref 0–200)
HDL: 32.7 mg/dL — AB (ref >39.00)
NONHDL: 160.72
TRIGLYCERIDES: 245 mg/dL — AB (ref 0.0–149.0)
VLDL: 49 mg/dL — ABNORMAL HIGH (ref 0.0–40.0)

## 2017-05-08 LAB — HEPATIC FUNCTION PANEL
ALBUMIN: 4.1 g/dL (ref 3.5–5.2)
ALK PHOS: 53 U/L (ref 39–117)
ALT: 20 U/L (ref 0–53)
AST: 17 U/L (ref 0–37)
BILIRUBIN DIRECT: 0.2 mg/dL (ref 0.0–0.3)
BILIRUBIN TOTAL: 0.9 mg/dL (ref 0.2–1.2)
Total Protein: 7 g/dL (ref 6.0–8.3)

## 2017-05-08 LAB — PSA: PSA: 3.59 ng/mL (ref 0.10–4.00)

## 2017-05-08 LAB — HEMOGLOBIN A1C: Hgb A1c MFr Bld: 7.5 % — ABNORMAL HIGH (ref 4.6–6.5)

## 2017-05-08 LAB — BASIC METABOLIC PANEL
BUN: 21 mg/dL (ref 6–23)
CALCIUM: 9.8 mg/dL (ref 8.4–10.5)
CHLORIDE: 103 meq/L (ref 96–112)
CO2: 28 meq/L (ref 19–32)
CREATININE: 1.38 mg/dL (ref 0.40–1.50)
GFR: 52.86 mL/min — ABNORMAL LOW (ref >60.00)
GLUCOSE: 134 mg/dL — AB (ref 70–99)
Potassium: 4.2 mEq/L (ref 3.5–5.1)
Sodium: 139 mEq/L (ref 135–145)

## 2017-05-08 LAB — LDL CHOLESTEROL, DIRECT: LDL DIRECT: 122 mg/dL

## 2017-05-08 MED ORDER — LOSARTAN POTASSIUM 100 MG PO TABS: 100.0000 mg | ORAL_TABLET | Freq: Every day | ORAL | 3 refills | Status: DC

## 2017-05-08 MED ORDER — OMEPRAZOLE 20 MG PO CPDR: 20.0000 mg | DELAYED_RELEASE_CAPSULE | Freq: Two times a day (BID) | ORAL | 3 refills | Status: DC

## 2017-05-08 MED ORDER — PRAVASTATIN SODIUM 40 MG PO TABS: ORAL_TABLET | ORAL | 3 refills | Status: DC

## 2017-05-08 MED ORDER — LEVOTHYROXINE SODIUM 125 MCG PO TABS: ORAL_TABLET | ORAL | 3 refills | Status: DC

## 2017-05-08 MED ORDER — GLIPIZIDE ER 2.5 MG PO TB24: 2.5000 mg | ORAL_TABLET | Freq: Every day | ORAL | 3 refills | Status: DC

## 2017-05-08 NOTE — Patient Instructions (Addendum)
Please take all new medication as prescribed - the pravastatin every other day  Please continue all other medications as before, and refills have been done as requested.  Please have the pharmacy call with any other refills you may need.  Please continue your efforts at being more active, low cholesterol diet, and weight control.  You are otherwise up to date with prevention measures today.  Please keep your appointments with your specialists as you may have planned  We will sign you up for the [facility]  Please go to the LAB in the Basement (turn left off the elevator) for the tests to be done today  You will be contacted by phone if any changes need to be made immediately.  Otherwise, you will receive a letter about your results with an explanation, but please check with MyChart first.  Please remember to sign up for MyChart if you have not done so, as this will be important to you in the future with finding out test results, communicating by private email, and scheduling acute appointments online when needed.  Please return in 6 months, or sooner if needed

## 2017-05-08 NOTE — Progress Notes (Signed)
Subjective:    Patient ID: Nathan Russo, male    DOB: 05/12/1940, 77 y.o.   MRN: [id number]  HPI  Here for yearly f/u;  Overall doing ok;  Pt denies Chest pain, worsening SOB, DOE, wheezing, orthopnea, PND, worsening LE edema, palpitations, dizziness or syncope.  Pt denies neurological change such as new headache, facial or extremity weakness.  Pt denies polydipsia, polyuria, or low sugar symptoms. Pt states overall good compliance with treatment and medications, good tolerability, and has been trying to follow appropriate diet.  Pt denies worsening depressive symptoms, suicidal ideation or panic. No fever, night sweats, wt loss, loss of appetite, or other constitutional symptoms.  Pt states good ability with ADL's, has low fall risk, home safety reviewed and adequate, no other significant changes in hearing or vision, and not active with exercise.   Pt felt to too high risk for right shoulder and left knee per Dr Theda Sers.  Now riding recumbant bike 6-7 miles per day.  Denies urinary symptoms such as dysuria, frequency, urgency, flank pain, hematuria or n/v, fever, chills.  Has seen urology, not overly concerned with his type of elveated psa and has f/u June 6.  Denies worsening reflux, abd pain, dysphagia, n/v, bowel change or blood.  No other new complaints or interval hx  Past Medical History:  Diagnosis Date  . Abdominal pain, epigastric 04/11/2010  . BELCHING 04/23/2010  . BRADYCARDIA, CHRONIC 10/24/2006  . CARPAL TUNNEL SYNDROME, BILATERAL 02/07/2009  . CHEST PAIN-UNSPECIFIED 09/05/2008  . CHOLELITHIASIS 04/22/2010  . Cholelithiasis 06/13/2010  . COLONIC POLYPS, HX OF 04/28/2007  . DEGENERATIVE JOINT DISEASE, RIGHT KNEE 10/24/2006  . Depression 02/23/2011  . DIABETES MELLITUS, TYPE II 04/28/2007  . Dizziness and giddiness 12/19/2009  . Elevated PSA 06/13/2010  . FATIGUE 04/28/2007  . GERD 02/11/2009  . Headache(784.0) 12/19/2009  . HYPERLIPIDEMIA 04/28/2007  . HYPERTENSION 10/21/2006  . NECK MASS  02/07/2010  . OBESITY 10/24/2006  . OTITIS MEDIA, ACUTE, LEFT 12/26/2009  . PHIMOSIS 02/07/2009  . S/P laparoscopic cholecystectomy 10/31/2010  . Thyroid cancer ([place]) 06/13/2010  . THYROID NODULE 02/07/2010   Past Surgical History:  Procedure Laterality Date  . CHOLECYSTECTOMY    . left knee surgery    . right wrist surgury    . THYROID SURGERY      reports that he has quit smoking. he has never used smokeless tobacco. He reports that he does not drink alcohol or use drugs. family history includes Arthritis in his mother; Cancer in his brother and sister; Colon cancer (age of onset: 67) in his brother; Diabetes in his brother; Goiter in his mother; Heart attack (age of onset: 16) in his brother; Heart attack (age of onset: 71) in his father; Hypertension in his mother; Hypothyroidism in his sister. Allergies  Allergen Reactions  . Ace Inhibitors     REACTION: cough  . Atenolol     REACTION: bradycardia  . Codeine Other (See Comments)    Head spins "wild"  . Levaquin [Levofloxacin]     Interferes with flecainide  . Lovastatin     REACTION: myalygros  . Morphine Nausea And Vomiting  . Morphine And Related   . Statins   . Sulfa Antibiotics Other (See Comments)    As child almost died  . Sulfamethoxazole Other (See Comments)    Childhood unknown reaction  . Tramadol     Ants crawling all over   Current Outpatient Medications on File Prior to Visit  Medication Sig Dispense Refill  .  amLODipine (NORVASC) 5 MG tablet Take by mouth.    [name] apixaban (ELIQUIS) 5 MG TABS tablet Take by mouth 2 (two) times daily.    . Ascorbic Acid (VITAMIN C) 1000 MG tablet Take 1,000 mg by mouth daily.      . blood glucose meter kit and supplies KIT Dispense based on patient and insurance preference. Use up to four times daily as directed. (FOR ICD-9 250.00, 250.01). 1 each 0  . Calcium 500-125 MG-UNIT TABS 1 tablet daily.    . calcium carbonate (OS-CAL) 600 MG TABS Take 600 mg by mouth daily.      .  Cinnamon 500 MG capsule Take 500 mg by mouth 2 (two) times daily.      [name] diltiazem (CARTIA XT) 120 MG 24 hr capsule Take 1 capsule (120 mg total) by mouth 2 (two) times daily. 180 capsule 3  . ezetimibe (ZETIA) 10 MG tablet Take 1 tablet (10 mg total) by mouth daily. 90 tablet 3  . Flaxseed, Linseed, 1000 MG CAPS Take 1 capsule by mouth daily.    . flecainide (TAMBOCOR) 100 MG tablet Take 100 mg by mouth 2 (two) times daily.    . Garlic Oil (ODORLESS GARLIC) 161 MG TABS Take by mouth 2 (two) times daily.      . Ginger, Zingiber officinalis, (GINGER PO) Take by mouth 4 (four) times daily.      . Glucosamine-Chondroit-Vit C-Mn (GLUCOSAMINE CHONDROITIN COMPLX) CAPS Take by mouth daily.      [name] glucose blood test strip Check glucose 2-3 times a day. E11.40 100 each 12  . metFORMIN (GLUCOPHAGE-XR) 500 MG 24 hr tablet TAKE 4 TABLETS EVERY DAY 360 tablet 3  . Misc Natural Products (OSTEO BI-FLEX TRIPLE STRENGTH) TABS Take 1 tablet by mouth.    . Multiple Vitamin (MULTIVITAMIN) capsule Take 1 capsule by mouth daily.      . Omega-3 Fatty Acids (FISH OIL) 1000 MG CAPS Take by mouth 2 (two) times daily.      . rosuvastatin (CRESTOR) 10 MG tablet Take 1 tablet (10 mg total) by mouth every other day. 45 tablet 3  . triamcinolone (KENALOG) 0.025 % cream Apply 1 application topically 2 (two) times daily.    . Triamcinolone Acetonide (TRIAMCINOLONE 0.1 % CREAM : EUCERIN) CREA Apply 1 application topically.    [name] VITAMIN D, CHOLECALCIFEROL, PO Take by mouth daily.       No current facility-administered medications on file prior to visit.    Review of Systems Constitutional: Negative for other unusual diaphoresis, sweats, appetite or weight changes HENT: Negative for other worsening hearing loss, ear pain, facial swelling, mouth sores or neck stiffness.   Eyes: Negative for other worsening pain, redness or other visual disturbance.  Respiratory: Negative for other stridor or swelling Cardiovascular: Negative  for other palpitations or other chest pain  Gastrointestinal: Negative for worsening diarrhea or loose stools, blood in stool, distention or other pain Genitourinary: Negative for hematuria, flank pain or other change in urine volume.  Musculoskeletal: Negative for myalgias or other joint swelling.  Skin: Negative for other color change, or other wound or worsening drainage.  Neurological: Negative for other syncope or numbness. Hematological: Negative for other adenopathy or swelling Psychiatric/Behavioral: Negative for hallucinations, other worsening agitation, SI, self-injury, or new decreased concentration All other system neg per pt    Objective:   Physical Exam BP 116/74   Pulse 60   Temp 97.7 F (36.5 C) (Oral)   Ht 6' (1.829 m)  Wt 239 lb (108.4 kg)   SpO2 97%   BMI 32.41 kg/m  VS noted, morbid obese Constitutional: Pt is oriented to person, place, and time. Appears well-developed and well-nourished, in no significant distress and comfortable Head: Normocephalic and atraumatic  Eyes: Conjunctivae and EOM are normal. Pupils are equal, round, and reactive to light Right Ear: External ear normal without discharge Left Ear: External ear normal without discharge Nose: Nose without discharge or deformity Mouth/Throat: Oropharynx is without other ulcerations and moist  Neck: Normal range of motion. Neck supple. No JVD present. No tracheal deviation present or significant neck LA or mass Cardiovascular: Normal rate, regular rhythm, normal heart sounds and intact distal pulses.   Pulmonary/Chest: WOB normal and breath sounds without rales or wheezing  Abdominal: Soft. Bowel sounds are normal. NT. No HSM  Musculoskeletal: Normal range of motion. Exhibits no edema Lymphadenopathy: Has no other cervical adenopathy.  Neurological: Pt is alert and oriented to person, place, and time. Pt has normal reflexes. No cranial nerve deficit. Motor grossly intact, Skin: Skin is warm and dry. No  rash noted or new ulcerations Psychiatric:  Has normal mood and affect. Behavior is normal without agitation No other exam findings  Lab Results  Component Value Date   WBC 7.4 11/04/2016   HGB 14.8 11/04/2016   HCT 43.6 11/04/2016   PLT 293.0 11/04/2016   GLUCOSE 134 (H) 05/08/2017   CHOL 193 05/08/2017   TRIG 245.0 (H) 05/08/2017   HDL 32.70 (L) 05/08/2017   LDLDIRECT 122.0 05/08/2017   LDLCALC 113 (H) 11/04/2016   ALT 20 05/08/2017   AST 17 05/08/2017   NA 139 05/08/2017   K 4.2 05/08/2017   CL 103 05/08/2017   CREATININE 1.38 05/08/2017   BUN 21 05/08/2017   CO2 28 05/08/2017   TSH 1.38 11/04/2016   PSA 3.59 05/08/2017   INR 1.0 08/22/2008   HGBA1C 7.5 (H) 05/08/2017   MICROALBUR 1.1 11/04/2016      Assessment & Plan:

## 2017-05-09 NOTE — Assessment & Plan Note (Addendum)
stable overall by history and exam, recent data reviewed with pt, and pt to continue medical treatment as before,  to f/u any worsening symptoms or concerns, for f/u a1c with labs today

## 2017-05-09 NOTE — Assessment & Plan Note (Signed)
Lab Results  Component Value Date   LDLCALC 113 (H) 11/04/2016  for trial pravastatin 40 qod to see if can tolerate

## 2017-05-09 NOTE — Assessment & Plan Note (Signed)
stable overall by history and exam, recent data reviewed with pt, and pt to continue medical treatment as before,  to f/u any worsening symptoms or concerns BP Readings from Last 3 Encounters:  05/08/17 116/74  11/04/16 (!) 144/80  05/21/16 (!) 150/85

## 2017-05-11 ENCOUNTER — Telehealth: Payer: Self-pay

## 2017-05-11 ENCOUNTER — Telehealth: Payer: Self-pay | Admitting: Internal Medicine

## 2017-05-11 ENCOUNTER — Other Ambulatory Visit: Payer: Self-pay

## 2017-05-11 MED ORDER — METFORMIN HCL ER 500 MG PO TB24: ORAL_TABLET | ORAL | 3 refills | Status: DC

## 2017-05-11 NOTE — Telephone Encounter (Signed)
Pt has viewed results via MyChart  

## 2017-05-11 NOTE — Telephone Encounter (Signed)
Copied from [place] [id number]. Topic: Quick Communication - Rx Refill/Question >> May 11, 2017  7:59 AM Celedonio Savage L wrote: Medication: metFORMIN (GLUCOPHAGE-XR) 500 MG 24 hr tablet   Has the patient contacted their pharmacy? Yes.     (Agent: If no, request that the patient contact the pharmacy for the refill.)   Preferred Pharmacy (with phone number or street name): [facility], [place]   Agent: Please be advised that RX refills may take up to 3 business days. We ask that you follow-up with your pharmacy.

## 2017-05-11 NOTE — Telephone Encounter (Signed)
Copied from [place] [id number]. Topic: Quick Communication - See Telephone Encounter >> May 11, 2017  2:39 PM Hewitt Shorts wrote: CRM for notification. See Telephone encounter for: pt is also needing test strips testing 4 times a day 90 supppy for the accu check aviva meter [facility] [address]   Best number [phone]  05/11/17.

## 2017-05-11 NOTE — Telephone Encounter (Signed)
-----   Message from Biagio Borg, MD sent at 05/08/2017  7:42 PM EST ----- Left message on MyChart, pt to cont same tx except  The test results show that your current treatment is OK, except the a1c is mildly worsening.  We should add a low dose second diabetic medication called Glucotrol XL 2.5 mg per day.  I will send a new prescription, and you should hear from the office as well.    Kapil Petropoulos to please inform pt, I will do rx

## 2017-05-12 MED ORDER — GLUCOSE BLOOD VI STRP: ORAL_STRIP | 12 refills | Status: DC

## 2017-05-18 DIAGNOSIS — Z1212 Encounter for screening for malignant neoplasm of rectum: Secondary | ICD-10-CM | POA: Diagnosis not present

## 2017-05-18 DIAGNOSIS — Z1211 Encounter for screening for malignant neoplasm of colon: Secondary | ICD-10-CM | POA: Diagnosis not present

## 2017-05-27 DIAGNOSIS — E119 Type 2 diabetes mellitus without complications: Secondary | ICD-10-CM | POA: Diagnosis not present

## 2017-05-27 DIAGNOSIS — I471 Supraventricular tachycardia: Secondary | ICD-10-CM | POA: Diagnosis not present

## 2017-05-27 DIAGNOSIS — I480 Paroxysmal atrial fibrillation: Secondary | ICD-10-CM | POA: Diagnosis not present

## 2017-05-27 DIAGNOSIS — I10 Essential (primary) hypertension: Secondary | ICD-10-CM | POA: Diagnosis not present

## 2017-06-04 LAB — COLOGUARD: Cologuard: NEGATIVE

## 2017-06-24 DIAGNOSIS — C73 Malignant neoplasm of thyroid gland: Secondary | ICD-10-CM | POA: Diagnosis not present

## 2017-06-24 DIAGNOSIS — E890 Postprocedural hypothyroidism: Secondary | ICD-10-CM | POA: Diagnosis not present

## 2017-06-24 DIAGNOSIS — Z9089 Acquired absence of other organs: Secondary | ICD-10-CM | POA: Diagnosis not present

## 2017-06-24 DIAGNOSIS — Z483 Aftercare following surgery for neoplasm: Secondary | ICD-10-CM | POA: Diagnosis not present

## 2017-07-08 ENCOUNTER — Ambulatory Visit (INDEPENDENT_AMBULATORY_CARE_PROVIDER_SITE_OTHER): Payer: Medicare Other | Admitting: Sports Medicine

## 2017-07-08 ENCOUNTER — Encounter: Payer: Self-pay | Admitting: Sports Medicine

## 2017-07-08 DIAGNOSIS — B351 Tinea unguium: Secondary | ICD-10-CM | POA: Diagnosis not present

## 2017-07-08 DIAGNOSIS — M79676 Pain in unspecified toe(s): Secondary | ICD-10-CM

## 2017-07-08 DIAGNOSIS — I739 Peripheral vascular disease, unspecified: Secondary | ICD-10-CM

## 2017-07-08 DIAGNOSIS — E1140 Type 2 diabetes mellitus with diabetic neuropathy, unspecified: Secondary | ICD-10-CM

## 2017-07-08 NOTE — Progress Notes (Signed)
Subjective: Nathan Russo is a 77 y.o. male patient with history of diabetes who returns to office today complaining of long, painful nails  while ambulating in shoes; unable to trim. Patient states that the glucose reading was 117 one day ago, last A1c 7. Patient denies any new changes in medication or new problems since last visit.  No other concerns.   States he saw PCP, [name] on May 08, 2017.   Patient Active Problem List   Diagnosis Date Noted  . Leg abscess 03/04/2016  . Right leg pain 02/29/2016  . Cough 02/23/2016  . PAT (paroxysmal atrial tachycardia) ([place]) 09/07/2015  . Abnormal breath sounds 01/11/2015  . Otitis externa 08/02/2014  . Hypersomnia 09/14/2013  . Dyspnea on exertion 04/12/2013  . Hypotension 04/12/2013  . Community acquired pneumonia 04/06/2013  . Status post ablation of atrial fibrillation 03/17/2013  . Status post ablation of atrial flutter 03/17/2013  . Postoperative hypothyroidism 05/05/2012  . Papillary carcinoma of thyroid ([place]) 05/05/2012  . Essential hypertension 03/28/2012  . Atrial fibrillation and flutter ([place]) 03/27/2012  . Pain in wrist 09/03/2011  . Depression 02/23/2011  . Vertigo 02/23/2011  . Supraventricular arrhythmia 02/06/2011  . Chronic pain 12/13/2010  . History of cholecystectomy 10/31/2010  . Routine history and physical examination of adult 10/31/2010  . Bladder neck obstruction 06/13/2010  . Elevated prostate specific antigen (PSA) 06/13/2010  . Gastroesophageal reflux disease 02/11/2009  . CARPAL TUNNEL SYNDROME, BILATERAL 02/07/2009  . Diabetes mellitus ([place]) 04/28/2007  . Hyperlipidemia 04/28/2007  . Malaise and fatigue 04/28/2007  . History of colonic polyps 04/28/2007  . Adiposity 10/24/2006  . BRADYCARDIA, CHRONIC 10/24/2006  . Osteoarthritis 10/24/2006  . Cardiac arrhythmia 10/24/2006   Current Outpatient Medications on File Prior to Visit  Medication Sig Dispense Refill  . amLODipine (NORVASC) 5 MG tablet Take  by mouth.    [name] apixaban (ELIQUIS) 5 MG TABS tablet Take by mouth 2 (two) times daily.    . Ascorbic Acid (VITAMIN C) 1000 MG tablet Take 1,000 mg by mouth daily.      . blood glucose meter kit and supplies KIT Dispense based on patient and insurance preference. Use up to four times daily as directed. (FOR ICD-9 250.00, 250.01). 1 each 0  . Calcium 500-125 MG-UNIT TABS 1 tablet daily.    . calcium carbonate (OS-CAL) 600 MG TABS Take 600 mg by mouth daily.      . Cinnamon 500 MG capsule Take 500 mg by mouth 2 (two) times daily.      [name] diltiazem (CARTIA XT) 120 MG 24 hr capsule Take 1 capsule (120 mg total) by mouth 2 (two) times daily. 180 capsule 3  . ezetimibe (ZETIA) 10 MG tablet Take 1 tablet (10 mg total) by mouth daily. 90 tablet 3  . Flaxseed, Linseed, 1000 MG CAPS Take 1 capsule by mouth daily.    . flecainide (TAMBOCOR) 100 MG tablet Take 100 mg by mouth 2 (two) times daily.    . Garlic Oil (ODORLESS GARLIC) 073 MG TABS Take by mouth 2 (two) times daily.      . Ginger, Zingiber officinalis, (GINGER PO) Take by mouth 4 (four) times daily.      [name] glipiZIDE (GLUCOTROL XL) 2.5 MG 24 hr tablet Take 1 tablet (2.5 mg total) by mouth daily with breakfast. 90 tablet 3  . Glucosamine-Chondroit-Vit C-Mn (GLUCOSAMINE CHONDROITIN COMPLX) CAPS Take by mouth daily.      [name] glucose blood test strip Check glucose 2-3 times  a day. E11.40 100 each 12  . levothyroxine (SYNTHROID, LEVOTHROID) 125 MCG tablet TAKE 1 TABLET ONE TIME DAILY BEFORE BREAKFAST 90 tablet 3  . losartan (COZAAR) 100 MG tablet Take 1 tablet (100 mg total) by mouth daily. 90 tablet 3  . metFORMIN (GLUCOPHAGE-XR) 500 MG 24 hr tablet TAKE 4 TABLETS EVERY DAY 360 tablet 3  . Misc Natural Products (OSTEO BI-FLEX TRIPLE STRENGTH) TABS Take 1 tablet by mouth.    . Multiple Vitamin (MULTIVITAMIN) capsule Take 1 capsule by mouth daily.      . Omega-3 Fatty Acids (FISH OIL) 1000 MG CAPS Take by mouth 2 (two) times daily.      [name] omeprazole  (PRILOSEC) 20 MG capsule Take 1 capsule (20 mg total) by mouth 2 (two) times daily. 180 capsule 3  . pravastatin (PRAVACHOL) 40 MG tablet 1 tab by mouth every other day 45 tablet 3  . rosuvastatin (CRESTOR) 10 MG tablet Take 1 tablet (10 mg total) by mouth every other day. 45 tablet 3  . triamcinolone (KENALOG) 0.025 % cream Apply 1 application topically 2 (two) times daily.    . Triamcinolone Acetonide (TRIAMCINOLONE 0.1 % CREAM : EUCERIN) CREA Apply 1 application topically.    [name] VITAMIN D, CHOLECALCIFEROL, PO Take by mouth daily.       No current facility-administered medications on file prior to visit.    Allergies  Allergen Reactions  . Ace Inhibitors     REACTION: cough  . Atenolol     REACTION: bradycardia  . Codeine Other (See Comments)    Head spins "wild"  . Levaquin [Levofloxacin]     Interferes with flecainide  . Lovastatin     REACTION: myalygros  . Morphine Nausea And Vomiting  . Morphine And Related   . Statins   . Sulfa Antibiotics Other (See Comments)    As child almost died  . Sulfamethoxazole Other (See Comments)    Childhood unknown reaction  . Tramadol     Ants crawling all over    Recent Results (from the past 2160 hour(s))  Hepatic function panel     Status: None   Collection Time: 05/08/17  3:21 PM  Result Value Ref Range   Total Bilirubin 0.9 0.2 - 1.2 mg/dL   Bilirubin, Direct 0.2 0.0 - 0.3 mg/dL   Alkaline Phosphatase 53 39 - 117 U/L   AST 17 0 - 37 U/L   ALT 20 0 - 53 U/L   Total Protein 7.0 6.0 - 8.3 g/dL   Albumin 4.1 3.5 - 5.2 g/dL  Basic metabolic panel     Status: Abnormal   Collection Time: 05/08/17  3:21 PM  Result Value Ref Range   Sodium 139 135 - 145 mEq/L   Potassium 4.2 3.5 - 5.1 mEq/L   Chloride 103 96 - 112 mEq/L   CO2 28 19 - 32 mEq/L   Glucose, Bld 134 (H) 70 - 99 mg/dL   BUN 21 6 - 23 mg/dL   Creatinine, Ser 1.38 0.40 - 1.50 mg/dL   Calcium 9.8 8.4 - 10.5 mg/dL   GFR 52.86 (L) >60.00 mL/min  Lipid panel     Status:  Abnormal   Collection Time: 05/08/17  3:21 PM  Result Value Ref Range   Cholesterol 193 0 - 200 mg/dL    Comment: ATP III Classification       Desirable:  < 200 mg/dL               Borderline  High:  200 - 239 mg/dL          High:  > = 240 mg/dL   Triglycerides 245.0 (H) 0.0 - 149.0 mg/dL    Comment: Normal:  <150 mg/dLBorderline High:  150 - 199 mg/dL   HDL 32.70 (L) >39.00 mg/dL   VLDL 49.0 (H) 0.0 - 40.0 mg/dL   Total CHOL/HDL Ratio 6     Comment:                Men          Women1/2 Average Risk     3.4          3.3Average Risk          5.0          4.42X Average Risk          9.6          7.13X Average Risk          15.0          11.0                       NonHDL 160.72     Comment: NOTE:  Non-HDL goal should be 30 mg/dL higher than patient's LDL goal (i.e. LDL goal of < 70 mg/dL, would have non-HDL goal of < 100 mg/dL)  Hemoglobin A1c     Status: Abnormal   Collection Time: 05/08/17  3:21 PM  Result Value Ref Range   Hgb A1c MFr Bld 7.5 (H) 4.6 - 6.5 %    Comment: Glycemic Control Guidelines for People with Diabetes:Non Diabetic:  <6%Goal of Therapy: <7%Additional Action Suggested:  >8%   PSA     Status: None   Collection Time: 05/08/17  3:21 PM  Result Value Ref Range   PSA 3.59 0.10 - 4.00 ng/mL    Comment: Test performed using Access Hybritech PSA Assay, a parmagnetic partical, chemiluminecent immunoassay.  LDL cholesterol, direct     Status: None   Collection Time: 05/08/17  3:21 PM  Result Value Ref Range   Direct LDL 122.0 mg/dL    Comment: Optimal:  <100 mg/dLNear or Above Optimal:  100-129 mg/dLBorderline High:  130-159 mg/dLHigh:  160-189 mg/dLVery High:  >190 mg/dL  Cologuard     Status: None   Collection Time: 06/04/17 12:00 AM  Result Value Ref Range   Cologuard Negative     Objective: General: Patient is awake, alert, and oriented x 3 and in no acute distress.  Integument: Skin is warm, dry and supple bilateral. Nails are tender, long, thickened and dystrophic  with subungual debris, consistent with onychomycosis, 1-5 bilateral. No signs of infection. No open lesions or preulcerative lesions present bilateral. Remaining integument unremarkable.  Vasculature:  Dorsalis Pedis pulse 1/4 bilateral. Posterior Tibial pulse  0/4 bilateral due to trace edema at ankles. Capillary fill time <5 sec 1-5 bilateral. Scant hair growth to the level of the digits.Temperature gradient within normal limits. Mild varicosities present bilateral. Trace edema present bilateral.   Neurology: The patient has intact sensation measured with a 5.07/10g Semmes Weinstein Monofilament at all pedal sites bilateral. Vibratory sensation diminished bilateral with tuning fork. No Babinski sign present bilateral.   Musculoskeletal: No symptomatic pedal deformities noted bilateral. Muscular strength 5/5 in all lower extremity muscular groups bilateral without pain on range of motion . No tenderness with calf compression bilateral.  Assessment and Plan: Problem List Items Addressed This Visit  Endocrine   Diabetes mellitus ([place])    Other Visit Diagnoses    Pain due to onychomycosis of toenail    -  Primary   PVD (peripheral vascular disease) ([place])          -Examined patient. -Discussed and educated patient on diabetic foot care, especially with  regards to the vascular, neurological and musculoskeletal systems.  -Stressed the importance of good glycemic control and the detriment of not controlling glucose levels in relation to the foot. -Mechanically debrided all nails 1-5 bilateral using sterile nail nipper and filed with dremel without incident  -Continue with elevation to assist with edema control  -Patient to return  in 3 months for at risk foot care -Patient advised to call the office if any problems or questions arise in the meantime.  Landis Martins, DPM

## 2017-07-17 DIAGNOSIS — M19011 Primary osteoarthritis, right shoulder: Secondary | ICD-10-CM | POA: Diagnosis not present

## 2017-07-17 DIAGNOSIS — M19012 Primary osteoarthritis, left shoulder: Secondary | ICD-10-CM | POA: Diagnosis not present

## 2017-07-17 DIAGNOSIS — M13811 Other specified arthritis, right shoulder: Secondary | ICD-10-CM | POA: Diagnosis not present

## 2017-07-27 ENCOUNTER — Other Ambulatory Visit (INDEPENDENT_AMBULATORY_CARE_PROVIDER_SITE_OTHER): Payer: Medicare Other

## 2017-07-27 ENCOUNTER — Encounter: Payer: Self-pay | Admitting: Internal Medicine

## 2017-07-27 ENCOUNTER — Ambulatory Visit: Payer: Self-pay

## 2017-07-27 ENCOUNTER — Ambulatory Visit (INDEPENDENT_AMBULATORY_CARE_PROVIDER_SITE_OTHER): Payer: Medicare Other | Admitting: Internal Medicine

## 2017-07-27 VITALS — BP 122/78 | HR 60 | Temp 98.0°F | Ht 72.0 in | Wt 227.0 lb

## 2017-07-27 DIAGNOSIS — E538 Deficiency of other specified B group vitamins: Secondary | ICD-10-CM

## 2017-07-27 DIAGNOSIS — G629 Polyneuropathy, unspecified: Secondary | ICD-10-CM | POA: Diagnosis not present

## 2017-07-27 DIAGNOSIS — E1140 Type 2 diabetes mellitus with diabetic neuropathy, unspecified: Secondary | ICD-10-CM | POA: Diagnosis not present

## 2017-07-27 DIAGNOSIS — I10 Essential (primary) hypertension: Secondary | ICD-10-CM | POA: Diagnosis not present

## 2017-07-27 LAB — HEMOGLOBIN A1C: Hgb A1c MFr Bld: 6.5 % (ref 4.6–6.5)

## 2017-07-27 MED ORDER — GABAPENTIN 100 MG PO CAPS
100.0000 mg | ORAL_CAPSULE | Freq: Three times a day (TID) | ORAL | 3 refills | Status: DC
Start: 2017-07-27 — End: 2017-09-21

## 2017-07-27 MED ORDER — DICLOFENAC SODIUM 1 % TD GEL: 4.0000 g | Freq: Four times a day (QID) | TRANSDERMAL | 3 refills | Status: DC | PRN

## 2017-07-27 NOTE — Assessment & Plan Note (Signed)
With pain, for volt gel prn for trial per pt request, and gabapentin 100 tid, also to check b12 with labs

## 2017-07-27 NOTE — Assessment & Plan Note (Addendum)
stable overall by history and exam, recent data reviewed with pt, and pt to continue medical treatment as before,  to f/u any worsening symptoms or concerns,  Lab Results  Component Value Date   HGBA1C 7.5 (H) 05/08/2017  for f/u lab today

## 2017-07-27 NOTE — Assessment & Plan Note (Signed)
stable overall by history and exam, recent data reviewed with pt, and pt to continue medical treatment as before,  to f/u any worsening symptoms or concerns BP Readings from Last 3 Encounters:  07/27/17 122/78  05/08/17 116/74  11/04/16 (!) 144/80

## 2017-07-27 NOTE — Telephone Encounter (Signed)
Pt. Reports he has noticed tingling to both lower legs and feet x 2 weeks. "Not really numb." Denies any chest pain or shortness of breath. Denies any other symptom. Request [name] refer him to Dr. Loretta Plume ( neurology) without seeing him. I explained to him he would need OV. Appointment made for today.  Reason for Disposition . [1] Numbness or tingling on both sides of body AND [2] is a new symptom present > 24 hours  Answer Assessment - Initial Assessment Questions 1. SYMPTOM: "What is the main symptom you are concerned about?" (e.g., weakness, numbness)     Tingling to both lower legs from calf to bottom of feet 2. ONSET: "When did this start?" (minutes, hours, days; while sleeping)     2 Weeks ago 3. LAST NORMAL: "When was the last time you were normal (no symptoms)?"     2 weeks 4. PATTERN "Does this come and go, or has it been constant since it started?"  "Is it present now?"     Constant 5. CARDIAC SYMPTOMS: "Have you had any of the following symptoms: chest pain, difficulty breathing, palpitations?"     No 6. NEUROLOGIC SYMPTOMS: "Have you had any of the following symptoms: headache, dizziness, vision loss, double vision, changes in speech, unsteady on your feet?"     No 7. OTHER SYMPTOMS: "Do you have any other symptoms?"     No 8. PREGNANCY: "Is there any chance you are pregnant?" "When was your last menstrual period?"     No  Protocols used: NEUROLOGIC DEFICIT-A-AH

## 2017-07-27 NOTE — Telephone Encounter (Signed)
Noted  

## 2017-07-27 NOTE — Patient Instructions (Addendum)
Please take all new medication as prescribed - the voltaren gel, and gabapentin  Please continue all other medications as before, and refills have been done if requested.  Please have the pharmacy call with any other refills you may need.  Please continue your efforts at being more active, low cholesterol diet, and weight control.  Please keep your appointments with your specialists as you may have planned  Please go to the LAB in the Basement (turn left off the elevator) for the tests to be done today  You will be contacted by phone if any changes need to be made immediately.  Otherwise, you will receive a letter about your results with an explanation, but please check with MyChart first.  Please remember to sign up for MyChart if you have not done so, as this will be important to you in the future with finding out test results, communicating by private email, and scheduling acute appointments online when needed.

## 2017-07-27 NOTE — Progress Notes (Signed)
Subjective:    Patient ID: Nathan Russo, male    DOB: 14-Mar-1940, 77 y.o.   MRN: [id number]  HPI  Here to f/u leg tingling and pain located sock like distribution to the distal LE, with intermittent numbness, but without weakness.  Moderate, constant but for some reason worse in the last 2 days.  Nothing seems to make better or worse.  Overall, Balance is actually better with ambulation, riding a recumbant bike 4-7 miles per day; no longer using cane. No falls.  No LBP.  Has hx of psoriasis but no recent worsening Pt denies chest pain, increased sob or doe, wheezing, orthopnea, PND, increased LE swelling, palpitations, dizziness or syncope.   Pt denies polydipsia, polyuria, or low sugar symptoms such as weakness or confusion improved with po intake.  Pt states overall good compliance with meds Past Medical History:  Diagnosis Date  . Abdominal pain, epigastric 04/11/2010  . BELCHING 04/23/2010  . BRADYCARDIA, CHRONIC 10/24/2006  . CARPAL TUNNEL SYNDROME, BILATERAL 02/07/2009  . CHEST PAIN-UNSPECIFIED 09/05/2008  . CHOLELITHIASIS 04/22/2010  . Cholelithiasis 06/13/2010  . COLONIC POLYPS, HX OF 04/28/2007  . DEGENERATIVE JOINT DISEASE, RIGHT KNEE 10/24/2006  . Depression 02/23/2011  . DIABETES MELLITUS, TYPE II 04/28/2007  . Dizziness and giddiness 12/19/2009  . Elevated PSA 06/13/2010  . FATIGUE 04/28/2007  . GERD 02/11/2009  . Headache(784.0) 12/19/2009  . HYPERLIPIDEMIA 04/28/2007  . HYPERTENSION 10/21/2006  . NECK MASS 02/07/2010  . OBESITY 10/24/2006  . OTITIS MEDIA, ACUTE, LEFT 12/26/2009  . PHIMOSIS 02/07/2009  . S/P laparoscopic cholecystectomy 10/31/2010  . Thyroid cancer ([place]) 06/13/2010  . THYROID NODULE 02/07/2010   Past Surgical History:  Procedure Laterality Date  . CHOLECYSTECTOMY    . left knee surgery    . right wrist surgury    . THYROID SURGERY      reports that he has quit smoking. He has never used smokeless tobacco. He reports that he does not drink alcohol or use drugs. family  history includes Arthritis in his mother; Cancer in his brother and sister; Colon cancer (age of onset: 59) in his brother; Diabetes in his brother; Goiter in his mother; Heart attack (age of onset: 96) in his brother; Heart attack (age of onset: 75) in his father; Hypertension in his mother; Hypothyroidism in his sister. Allergies  Allergen Reactions  . Ace Inhibitors     REACTION: cough  . Atenolol     REACTION: bradycardia  . Codeine Other (See Comments)    Head spins "wild"  . Levaquin [Levofloxacin]     Interferes with flecainide  . Lovastatin     REACTION: myalygros  . Morphine Nausea And Vomiting  . Morphine And Related   . Statins   . Sulfa Antibiotics Other (See Comments)    As child almost died  . Sulfamethoxazole Other (See Comments)    Childhood unknown reaction  . Tramadol     Ants crawling all over   Current Outpatient Medications on File Prior to Visit  Medication Sig Dispense Refill  . amLODipine (NORVASC) 5 MG tablet Take by mouth.    [name] apixaban (ELIQUIS) 5 MG TABS tablet Take by mouth 2 (two) times daily.    . Ascorbic Acid (VITAMIN C) 1000 MG tablet Take 1,000 mg by mouth daily.      . blood glucose meter kit and supplies KIT Dispense based on patient and insurance preference. Use up to four times daily as directed. (FOR ICD-9 250.00, 250.01). 1 each 0  .  Calcium 500-125 MG-UNIT TABS 1 tablet daily.    . calcium carbonate (OS-CAL) 600 MG TABS Take 600 mg by mouth daily.      . Cinnamon 500 MG capsule Take 500 mg by mouth 2 (two) times daily.      [name] diltiazem (CARTIA XT) 120 MG 24 hr capsule Take 1 capsule (120 mg total) by mouth 2 (two) times daily. 180 capsule 3  . Flaxseed, Linseed, 1000 MG CAPS Take 1 capsule by mouth daily.    . flecainide (TAMBOCOR) 100 MG tablet Take 100 mg by mouth 2 (two) times daily.    . Garlic Oil (ODORLESS GARLIC) 646 MG TABS Take by mouth 2 (two) times daily.      . Ginger, Zingiber officinalis, (GINGER PO) Take by mouth 4 (four)  times daily.      [name] glipiZIDE (GLUCOTROL XL) 2.5 MG 24 hr tablet Take 1 tablet (2.5 mg total) by mouth daily with breakfast. 90 tablet 3  . Glucosamine-Chondroit-Vit C-Mn (GLUCOSAMINE CHONDROITIN COMPLX) CAPS Take by mouth daily.      [name] glucose blood test strip Check glucose 2-3 times a day. E11.40 100 each 12  . levothyroxine (SYNTHROID, LEVOTHROID) 125 MCG tablet TAKE 1 TABLET ONE TIME DAILY BEFORE BREAKFAST 90 tablet 3  . losartan (COZAAR) 100 MG tablet Take 1 tablet (100 mg total) by mouth daily. 90 tablet 3  . metFORMIN (GLUCOPHAGE-XR) 500 MG 24 hr tablet TAKE 4 TABLETS EVERY DAY 360 tablet 3  . Misc Natural Products (OSTEO BI-FLEX TRIPLE STRENGTH) TABS Take 1 tablet by mouth.    . Multiple Vitamin (MULTIVITAMIN) capsule Take 1 capsule by mouth daily.      . Omega-3 Fatty Acids (FISH OIL) 1000 MG CAPS Take by mouth 2 (two) times daily.      [name] omeprazole (PRILOSEC) 20 MG capsule Take 1 capsule (20 mg total) by mouth 2 (two) times daily. 180 capsule 3  . pravastatin (PRAVACHOL) 40 MG tablet 1 tab by mouth every other day 45 tablet 3  . triamcinolone (KENALOG) 0.025 % cream Apply 1 application topically 2 (two) times daily.    . Triamcinolone Acetonide (TRIAMCINOLONE 0.1 % CREAM : EUCERIN) CREA Apply 1 application topically.    [name] VITAMIN D, CHOLECALCIFEROL, PO Take by mouth daily.       No current facility-administered medications on file prior to visit.    Review of Systems  Constitutional: Negative for other unusual diaphoresis or sweats HENT: Negative for ear discharge or swelling Eyes: Negative for other worsening visual disturbances Respiratory: Negative for stridor or other swelling  Gastrointestinal: Negative for worsening distension or other blood Genitourinary: Negative for retention or other urinary change Musculoskeletal: Negative for other MSK pain or swelling Skin: Negative for color change or other new lesions Neurological: Negative for worsening tremors and other numbness    Psychiatric/Behavioral: Negative for worsening agitation or other fatigue All other system neg per pt    Objective:   Physical Exam BP 122/78   Pulse 60   Temp 98 F (36.7 C) (Oral)   Ht 6' (1.829 m)   Wt 227 lb (103 kg)   SpO2 93%   BMI 30.79 kg/m  VS noted,  Constitutional: Pt appears in NAD HENT: Head: NCAT.  Right Ear: External ear normal.  Left Ear: External ear normal.  Eyes: . Pupils are equal, round, and reactive to light. Conjunctivae and EOM are normal Nose: without d/c or deformity Neck: Neck supple. Gross normal ROM Cardiovascular: Normal rate and  regular rhythm.   Pulmonary/Chest: Effort normal and breath sounds without rales or wheezing.  Abd:  Soft, NT, ND, + BS, no organomegaly Neurological: Pt is alert. At baseline orientation, motor grossly intact, has decreased sens to distal legs in sock distribution to about 6 cm above ankles Skin: Skin is warm. No rashes, other new lesions, no LE edema Psychiatric: Pt behavior is normal without agitation  No other exam findings  Lab Results  Component Value Date   HGBA1C 7.5 (H) 05/08/2017      Assessment & Plan:

## 2017-07-28 LAB — VITAMIN B12: Vitamin B-12: 304 pg/mL (ref 211–911)

## 2017-07-28 LAB — BASIC METABOLIC PANEL
BUN: 32 mg/dL — ABNORMAL HIGH (ref 6–23)
CALCIUM: 9.7 mg/dL (ref 8.4–10.5)
CO2: 22 meq/L (ref 19–32)
Chloride: 104 mEq/L (ref 96–112)
Creatinine, Ser: 1.45 mg/dL (ref 0.40–1.50)
GFR: 49.9 mL/min — ABNORMAL LOW (ref >60.00)
GLUCOSE: 124 mg/dL — AB (ref 70–99)
POTASSIUM: 4.2 meq/L (ref 3.5–5.1)
SODIUM: 137 meq/L (ref 135–145)

## 2017-08-05 DIAGNOSIS — M25512 Pain in left shoulder: Secondary | ICD-10-CM | POA: Insufficient documentation

## 2017-08-05 DIAGNOSIS — M25511 Pain in right shoulder: Secondary | ICD-10-CM | POA: Insufficient documentation

## 2017-08-07 DIAGNOSIS — I471 Supraventricular tachycardia: Secondary | ICD-10-CM | POA: Diagnosis not present

## 2017-08-07 DIAGNOSIS — I4891 Unspecified atrial fibrillation: Secondary | ICD-10-CM | POA: Diagnosis not present

## 2017-08-07 DIAGNOSIS — Z9889 Other specified postprocedural states: Secondary | ICD-10-CM | POA: Diagnosis not present

## 2017-08-07 DIAGNOSIS — Z8679 Personal history of other diseases of the circulatory system: Secondary | ICD-10-CM | POA: Diagnosis not present

## 2017-08-07 DIAGNOSIS — I499 Cardiac arrhythmia, unspecified: Secondary | ICD-10-CM | POA: Diagnosis not present

## 2017-08-07 DIAGNOSIS — I4892 Unspecified atrial flutter: Secondary | ICD-10-CM | POA: Diagnosis not present

## 2017-08-10 DIAGNOSIS — R9431 Abnormal electrocardiogram [ECG] [EKG]: Secondary | ICD-10-CM | POA: Diagnosis not present

## 2017-08-10 DIAGNOSIS — R001 Bradycardia, unspecified: Secondary | ICD-10-CM | POA: Diagnosis not present

## 2017-08-10 DIAGNOSIS — I440 Atrioventricular block, first degree: Secondary | ICD-10-CM | POA: Diagnosis not present

## 2017-08-11 DIAGNOSIS — M25511 Pain in right shoulder: Secondary | ICD-10-CM | POA: Diagnosis not present

## 2017-08-11 DIAGNOSIS — M25512 Pain in left shoulder: Secondary | ICD-10-CM | POA: Diagnosis not present

## 2017-08-13 DIAGNOSIS — N401 Enlarged prostate with lower urinary tract symptoms: Secondary | ICD-10-CM | POA: Diagnosis not present

## 2017-08-13 DIAGNOSIS — R3912 Poor urinary stream: Secondary | ICD-10-CM | POA: Diagnosis not present

## 2017-08-13 DIAGNOSIS — R3911 Hesitancy of micturition: Secondary | ICD-10-CM | POA: Diagnosis not present

## 2017-08-14 DIAGNOSIS — M25511 Pain in right shoulder: Secondary | ICD-10-CM | POA: Diagnosis not present

## 2017-08-14 DIAGNOSIS — M25512 Pain in left shoulder: Secondary | ICD-10-CM | POA: Diagnosis not present

## 2017-08-18 DIAGNOSIS — M25511 Pain in right shoulder: Secondary | ICD-10-CM | POA: Diagnosis not present

## 2017-08-18 DIAGNOSIS — M25512 Pain in left shoulder: Secondary | ICD-10-CM | POA: Diagnosis not present

## 2017-08-20 DIAGNOSIS — M25511 Pain in right shoulder: Secondary | ICD-10-CM | POA: Diagnosis not present

## 2017-08-20 DIAGNOSIS — M25512 Pain in left shoulder: Secondary | ICD-10-CM | POA: Diagnosis not present

## 2017-08-25 DIAGNOSIS — M25511 Pain in right shoulder: Secondary | ICD-10-CM | POA: Diagnosis not present

## 2017-08-25 DIAGNOSIS — M25512 Pain in left shoulder: Secondary | ICD-10-CM | POA: Diagnosis not present

## 2017-08-26 DIAGNOSIS — R9431 Abnormal electrocardiogram [ECG] [EKG]: Secondary | ICD-10-CM | POA: Diagnosis not present

## 2017-08-26 DIAGNOSIS — R860 Abnormal level of enzymes in specimens from male genital organs: Secondary | ICD-10-CM | POA: Diagnosis not present

## 2017-08-26 DIAGNOSIS — I4891 Unspecified atrial fibrillation: Secondary | ICD-10-CM | POA: Diagnosis not present

## 2017-08-26 DIAGNOSIS — R000 Tachycardia, unspecified: Secondary | ICD-10-CM | POA: Diagnosis not present

## 2017-08-26 DIAGNOSIS — I499 Cardiac arrhythmia, unspecified: Secondary | ICD-10-CM | POA: Diagnosis not present

## 2017-08-26 DIAGNOSIS — E1165 Type 2 diabetes mellitus with hyperglycemia: Secondary | ICD-10-CM | POA: Diagnosis not present

## 2017-08-27 DIAGNOSIS — R000 Tachycardia, unspecified: Secondary | ICD-10-CM | POA: Diagnosis not present

## 2017-08-27 DIAGNOSIS — R9431 Abnormal electrocardiogram [ECG] [EKG]: Secondary | ICD-10-CM | POA: Diagnosis not present

## 2017-09-01 DIAGNOSIS — M25512 Pain in left shoulder: Secondary | ICD-10-CM | POA: Diagnosis not present

## 2017-09-01 DIAGNOSIS — M25511 Pain in right shoulder: Secondary | ICD-10-CM | POA: Diagnosis not present

## 2017-09-04 DIAGNOSIS — M25512 Pain in left shoulder: Secondary | ICD-10-CM | POA: Diagnosis not present

## 2017-09-04 DIAGNOSIS — M25511 Pain in right shoulder: Secondary | ICD-10-CM | POA: Diagnosis not present

## 2017-09-09 DIAGNOSIS — I480 Paroxysmal atrial fibrillation: Secondary | ICD-10-CM | POA: Diagnosis not present

## 2017-09-09 DIAGNOSIS — I4892 Unspecified atrial flutter: Secondary | ICD-10-CM | POA: Diagnosis not present

## 2017-09-09 DIAGNOSIS — R9431 Abnormal electrocardiogram [ECG] [EKG]: Secondary | ICD-10-CM | POA: Diagnosis not present

## 2017-09-09 DIAGNOSIS — E119 Type 2 diabetes mellitus without complications: Secondary | ICD-10-CM | POA: Diagnosis not present

## 2017-09-09 DIAGNOSIS — I10 Essential (primary) hypertension: Secondary | ICD-10-CM | POA: Diagnosis not present

## 2017-09-09 DIAGNOSIS — Z8679 Personal history of other diseases of the circulatory system: Secondary | ICD-10-CM | POA: Diagnosis not present

## 2017-09-09 DIAGNOSIS — I471 Supraventricular tachycardia: Secondary | ICD-10-CM | POA: Diagnosis not present

## 2017-09-09 DIAGNOSIS — K219 Gastro-esophageal reflux disease without esophagitis: Secondary | ICD-10-CM | POA: Diagnosis not present

## 2017-09-09 DIAGNOSIS — Z9889 Other specified postprocedural states: Secondary | ICD-10-CM | POA: Diagnosis not present

## 2017-09-09 DIAGNOSIS — I440 Atrioventricular block, first degree: Secondary | ICD-10-CM | POA: Diagnosis not present

## 2017-09-09 DIAGNOSIS — I4891 Unspecified atrial fibrillation: Secondary | ICD-10-CM | POA: Diagnosis not present

## 2017-09-21 ENCOUNTER — Ambulatory Visit (INDEPENDENT_AMBULATORY_CARE_PROVIDER_SITE_OTHER): Payer: Medicare Other | Admitting: Internal Medicine

## 2017-09-21 ENCOUNTER — Encounter: Payer: Self-pay | Admitting: Internal Medicine

## 2017-09-21 ENCOUNTER — Other Ambulatory Visit (INDEPENDENT_AMBULATORY_CARE_PROVIDER_SITE_OTHER): Payer: Medicare Other

## 2017-09-21 VITALS — BP 122/76 | HR 55 | Temp 98.5°F | Ht 72.0 in | Wt 224.0 lb

## 2017-09-21 DIAGNOSIS — R197 Diarrhea, unspecified: Secondary | ICD-10-CM | POA: Diagnosis not present

## 2017-09-21 DIAGNOSIS — E890 Postprocedural hypothyroidism: Secondary | ICD-10-CM

## 2017-09-21 DIAGNOSIS — E1140 Type 2 diabetes mellitus with diabetic neuropathy, unspecified: Secondary | ICD-10-CM | POA: Diagnosis not present

## 2017-09-21 DIAGNOSIS — G8929 Other chronic pain: Secondary | ICD-10-CM | POA: Diagnosis not present

## 2017-09-21 DIAGNOSIS — M25511 Pain in right shoulder: Secondary | ICD-10-CM

## 2017-09-21 LAB — CBC WITH DIFFERENTIAL/PLATELET
BASOS ABS: 0.1 10*3/uL (ref 0.0–0.1)
BASOS PCT: 0.9 % (ref 0.0–3.0)
Eosinophils Absolute: 0.2 10*3/uL (ref 0.0–0.7)
Eosinophils Relative: 2.8 % (ref 0.0–5.0)
HCT: 42.1 % (ref 39.0–52.0)
Hemoglobin: 14.5 g/dL (ref 13.0–17.0)
LYMPHS ABS: 1.6 10*3/uL (ref 0.7–4.0)
Lymphocytes Relative: 19.3 % (ref 12.0–46.0)
MCHC: 34.6 g/dL (ref 30.0–36.0)
MCV: 89.8 fl (ref 78.0–100.0)
MONOS PCT: 8.5 % (ref 3.0–12.0)
Monocytes Absolute: 0.7 10*3/uL (ref 0.1–1.0)
NEUTROS ABS: 5.6 10*3/uL (ref 1.4–7.7)
NEUTROS PCT: 68.5 % (ref 43.0–77.0)
PLATELETS: 305 10*3/uL (ref 150.0–400.0)
RBC: 4.68 Mil/uL (ref 4.22–5.81)
RDW: 13.7 % (ref 11.5–15.5)
WBC: 8.1 10*3/uL (ref 4.0–10.5)

## 2017-09-21 MED ORDER — DIPHENOXYLATE-ATROPINE 2.5-0.025 MG PO TABS: 1.0000 | ORAL_TABLET | Freq: Four times a day (QID) | ORAL | 1 refills | Status: DC | PRN

## 2017-09-21 NOTE — Progress Notes (Signed)
Subjective:    Patient ID: Nathan Russo, male    DOB: 07/27/1940, 77 y.o.   MRN: [id number]  HPI  Here with acute onset diarrhea -  fri am with loose but mostly watery bowels wtihout fever, pain but persisted to today multiple times, brown like getting ready for a colonosocpy, no fever, no blood, n/v or abd pain, no medication changes or recent antibx; did feel warm somewhat  Has had more stress recently ; grandson lost his wife and his new drug addict girlfriend is pregnant  Still with right rot cuff tear and was asked to not pursue surgury per Dr Theda Sers per pt due to comorbid heart disease.  Pt denies chest pain, increased sob or doe, wheezing, orthopnea, PND, increased LE swelling, palpitations, dizziness or syncope. Pt denies new neurological symptoms such as new headache, or facial or extremity weakness or numbness   Pt denies polydipsia, polyuria,  Past Medical History:  Diagnosis Date  . Abdominal pain, epigastric 04/11/2010  . BELCHING 04/23/2010  . BRADYCARDIA, CHRONIC 10/24/2006  . CARPAL TUNNEL SYNDROME, BILATERAL 02/07/2009  . CHEST PAIN-UNSPECIFIED 09/05/2008  . CHOLELITHIASIS 04/22/2010  . Cholelithiasis 06/13/2010  . COLONIC POLYPS, HX OF 04/28/2007  . DEGENERATIVE JOINT DISEASE, RIGHT KNEE 10/24/2006  . Depression 02/23/2011  . DIABETES MELLITUS, TYPE II 04/28/2007  . Dizziness and giddiness 12/19/2009  . Elevated PSA 06/13/2010  . FATIGUE 04/28/2007  . GERD 02/11/2009  . Headache(784.0) 12/19/2009  . HYPERLIPIDEMIA 04/28/2007  . HYPERTENSION 10/21/2006  . NECK MASS 02/07/2010  . OBESITY 10/24/2006  . OTITIS MEDIA, ACUTE, LEFT 12/26/2009  . PHIMOSIS 02/07/2009  . S/P laparoscopic cholecystectomy 10/31/2010  . Thyroid cancer ([place]) 06/13/2010  . THYROID NODULE 02/07/2010   Past Surgical History:  Procedure Laterality Date  . CHOLECYSTECTOMY    . left knee surgery    . right wrist surgury    . THYROID SURGERY      reports that he has quit smoking. He has never used smokeless  tobacco. He reports that he does not drink alcohol or use drugs. family history includes Arthritis in his mother; Cancer in his brother and sister; Colon cancer (age of onset: 18) in his brother; Diabetes in his brother; Goiter in his mother; Heart attack (age of onset: 67) in his brother; Heart attack (age of onset: 4) in his father; Hypertension in his mother; Hypothyroidism in his sister. Allergies  Allergen Reactions  . Ace Inhibitors     REACTION: cough  . Atenolol     REACTION: bradycardia  . Codeine Other (See Comments)    Head spins "wild"  . Levaquin [Levofloxacin]     Interferes with flecainide  . Lovastatin     REACTION: myalygros  . Morphine Nausea And Vomiting  . Morphine And Related   . Statins   . Sulfa Antibiotics Other (See Comments)    As child almost died  . Sulfamethoxazole Other (See Comments)    Childhood unknown reaction  . Tramadol     Ants crawling all over   Current Outpatient Medications on File Prior to Visit  Medication Sig Dispense Refill  . amLODipine (NORVASC) 5 MG tablet Take by mouth.    [name] apixaban (ELIQUIS) 5 MG TABS tablet Take by mouth 2 (two) times daily.    . Ascorbic Acid (VITAMIN C) 1000 MG tablet Take 1,000 mg by mouth daily.      . blood glucose meter kit and supplies KIT Dispense based on patient and insurance preference. Use up  to four times daily as directed. (FOR ICD-9 250.00, 250.01). 1 each 0  . Calcium 500-125 MG-UNIT TABS 1 tablet daily.    . calcium carbonate (OS-CAL) 600 MG TABS Take 600 mg by mouth daily.      . Cinnamon 500 MG capsule Take 500 mg by mouth 2 (two) times daily.      . diclofenac sodium (VOLTAREN) 1 % GEL Apply 4 g topically 4 (four) times daily as needed. 400 g 3  . diltiazem (CARTIA XT) 120 MG 24 hr capsule Take 1 capsule (120 mg total) by mouth 2 (two) times daily. 180 capsule 3  . Flaxseed, Linseed, 1000 MG CAPS Take 1 capsule by mouth daily.    . flecainide (TAMBOCOR) 100 MG tablet Take 100 mg by mouth 2  (two) times daily.    . Garlic Oil (ODORLESS GARLIC) 902 MG TABS Take by mouth 2 (two) times daily.      . Ginger, Zingiber officinalis, (GINGER PO) Take by mouth 4 (four) times daily.      . Glucosamine-Chondroit-Vit C-Mn (GLUCOSAMINE CHONDROITIN COMPLX) CAPS Take by mouth daily.      [name] glucose blood test strip Check glucose 2-3 times a day. E11.40 100 each 12  . levothyroxine (SYNTHROID, LEVOTHROID) 125 MCG tablet TAKE 1 TABLET ONE TIME DAILY BEFORE BREAKFAST 90 tablet 3  . losartan (COZAAR) 100 MG tablet Take 1 tablet (100 mg total) by mouth daily. 90 tablet 3  . metFORMIN (GLUCOPHAGE-XR) 500 MG 24 hr tablet TAKE 4 TABLETS EVERY DAY 360 tablet 3  . Misc Natural Products (OSTEO BI-FLEX TRIPLE STRENGTH) TABS Take 1 tablet by mouth.    . Multiple Vitamin (MULTIVITAMIN) capsule Take 1 capsule by mouth daily.      . Omega-3 Fatty Acids (FISH OIL) 1000 MG CAPS Take by mouth 2 (two) times daily.      [name] omeprazole (PRILOSEC) 20 MG capsule Take 1 capsule (20 mg total) by mouth 2 (two) times daily. 180 capsule 3  . triamcinolone (KENALOG) 0.025 % cream Apply 1 application topically 2 (two) times daily.    . Triamcinolone Acetonide (TRIAMCINOLONE 0.1 % CREAM : EUCERIN) CREA Apply 1 application topically.    [name] VITAMIN D, CHOLECALCIFEROL, PO Take by mouth daily.       No current facility-administered medications on file prior to visit.    Review of Systems  Constitutional: Negative for other unusual diaphoresis or sweats HENT: Negative for ear discharge or swelling Eyes: Negative for other worsening visual disturbances Respiratory: Negative for stridor or other swelling  Gastrointestinal: Negative for worsening distension or other blood Genitourinary: Negative for retention or other urinary change Musculoskeletal: Negative for other MSK pain or swelling Skin: Negative for color change or other new lesions Neurological: Negative for worsening tremors and other numbness  Psychiatric/Behavioral:  Negative for worsening agitation or other fatigue All other system neg per pt    Objective:   Physical Exam BP 122/76   Pulse (!) 55   Temp 98.5 F (36.9 C) (Oral)   Ht 6' (1.829 m)   Wt 224 lb (101.6 kg)   SpO2 96%   BMI 30.38 kg/m  VS noted,  Constitutional: Pt appears in NAD HENT: Head: NCAT.  Right Ear: External ear normal.  Left Ear: External ear normal.  Eyes: . Pupils are equal, round, and reactive to light. Conjunctivae and EOM are normal Nose: without d/c or deformity Neck: Neck supple. Gross normal ROM Cardiovascular: Normal rate and regular rhythm.   Pulmonary/Chest:  Effort normal and breath sounds without rales or wheezing.  Abd:  Soft, NT, ND, + BS, no organomegaly Neurological: Pt is alert. At baseline orientation, motor grossly intact Skin: Skin is warm. No rashes, other new lesions, no LE edema Psychiatric: Pt behavior is normal without agitation  No other exam findings    Assessment & Plan:

## 2017-09-21 NOTE — Patient Instructions (Addendum)
Please take all new medication as prescribed - the lomotil if the immodium is not helpful  Please continue all other medications as before, and refills have been done if requested.  Please have the pharmacy call with any other refills you may need.  Please keep your appointments with your specialists as you may have planned  Please go to the LAB in the Basement (turn left off the elevator) for the tests to be done today  You will be contacted by phone if any changes need to be made immediately.  Otherwise, you will receive a letter about your results with an explanation, but please check with MyChart first.  Please remember to sign up for MyChart if you have not done so, as this will be important to you in the future with finding out test results, communicating by private email, and scheduling acute appointments online when needed.

## 2017-09-21 NOTE — Assessment & Plan Note (Signed)
Chronic stable, cont same tx 

## 2017-09-21 NOTE — Assessment & Plan Note (Signed)
stable overall by history and exam, recent data reviewed with pt, and pt to continue medical treatment as before,  to f/u any worsening symptoms or concerns Lab Results  Component Value Date   HGBA1C 6.5 07/27/2017

## 2017-09-21 NOTE — Assessment & Plan Note (Signed)
Acute onset without fever, pain or blood; exam benign, for cbc and GI panel but doubt infectious etiology, for immodium or lomotil prn

## 2017-09-21 NOTE — Assessment & Plan Note (Signed)
Also for TFT's and will need f/u Dr Loanne Drilling with hx of thyroid cancer

## 2017-09-22 LAB — BASIC METABOLIC PANEL
BUN: 22 mg/dL (ref 6–23)
CHLORIDE: 107 meq/L (ref 96–112)
CO2: 20 mEq/L (ref 19–32)
Calcium: 9.4 mg/dL (ref 8.4–10.5)
Creatinine, Ser: 1.38 mg/dL (ref 0.40–1.50)
GFR: 52.81 mL/min — ABNORMAL LOW (ref >60.00)
Glucose, Bld: 164 mg/dL — ABNORMAL HIGH (ref 70–99)
Potassium: 3.8 mEq/L (ref 3.5–5.1)
SODIUM: 137 meq/L (ref 135–145)

## 2017-09-22 LAB — T4, FREE: FREE T4: 1.11 ng/dL (ref 0.60–1.60)

## 2017-09-22 LAB — HEPATIC FUNCTION PANEL
ALK PHOS: 56 U/L (ref 39–117)
ALT: 17 U/L (ref 0–53)
AST: 16 U/L (ref 0–37)
Albumin: 4 g/dL (ref 3.5–5.2)
BILIRUBIN DIRECT: 0 mg/dL (ref 0.0–0.3)
BILIRUBIN TOTAL: 0.8 mg/dL (ref 0.2–1.2)
TOTAL PROTEIN: 6.9 g/dL (ref 6.0–8.3)

## 2017-09-22 LAB — TSH: TSH: 1.65 u[IU]/mL (ref 0.35–4.50)

## 2017-09-23 ENCOUNTER — Ambulatory Visit: Payer: Medicare Other | Admitting: Sports Medicine

## 2017-09-24 ENCOUNTER — Ambulatory Visit: Payer: Self-pay

## 2017-09-24 DIAGNOSIS — B9620 Unspecified Escherichia coli [E. coli] as the cause of diseases classified elsewhere: Secondary | ICD-10-CM | POA: Diagnosis not present

## 2017-09-24 DIAGNOSIS — R531 Weakness: Secondary | ICD-10-CM | POA: Diagnosis not present

## 2017-09-24 DIAGNOSIS — R197 Diarrhea, unspecified: Secondary | ICD-10-CM | POA: Diagnosis not present

## 2017-09-24 DIAGNOSIS — K5730 Diverticulosis of large intestine without perforation or abscess without bleeding: Secondary | ICD-10-CM | POA: Diagnosis not present

## 2017-09-24 DIAGNOSIS — N179 Acute kidney failure, unspecified: Secondary | ICD-10-CM | POA: Diagnosis not present

## 2017-09-24 DIAGNOSIS — B9682 Vibrio vulnificus as the cause of diseases classified elsewhere: Secondary | ICD-10-CM | POA: Diagnosis not present

## 2017-09-24 DIAGNOSIS — Z794 Long term (current) use of insulin: Secondary | ICD-10-CM | POA: Diagnosis not present

## 2017-09-24 DIAGNOSIS — R5383 Other fatigue: Secondary | ICD-10-CM | POA: Diagnosis not present

## 2017-09-24 DIAGNOSIS — A041 Enterotoxigenic Escherichia coli infection: Secondary | ICD-10-CM | POA: Diagnosis present

## 2017-09-24 DIAGNOSIS — Z87891 Personal history of nicotine dependence: Secondary | ICD-10-CM | POA: Diagnosis not present

## 2017-09-24 DIAGNOSIS — E890 Postprocedural hypothyroidism: Secondary | ICD-10-CM | POA: Diagnosis present

## 2017-09-24 DIAGNOSIS — R634 Abnormal weight loss: Secondary | ICD-10-CM | POA: Diagnosis present

## 2017-09-24 DIAGNOSIS — E876 Hypokalemia: Secondary | ICD-10-CM | POA: Diagnosis present

## 2017-09-24 DIAGNOSIS — E872 Acidosis: Secondary | ICD-10-CM | POA: Diagnosis present

## 2017-09-24 DIAGNOSIS — R112 Nausea with vomiting, unspecified: Secondary | ICD-10-CM | POA: Diagnosis not present

## 2017-09-24 DIAGNOSIS — I480 Paroxysmal atrial fibrillation: Secondary | ICD-10-CM | POA: Diagnosis present

## 2017-09-24 DIAGNOSIS — A09 Infectious gastroenteritis and colitis, unspecified: Secondary | ICD-10-CM | POA: Diagnosis not present

## 2017-09-24 DIAGNOSIS — E785 Hyperlipidemia, unspecified: Secondary | ICD-10-CM | POA: Diagnosis present

## 2017-09-24 DIAGNOSIS — I10 Essential (primary) hypertension: Secondary | ICD-10-CM | POA: Diagnosis present

## 2017-09-24 DIAGNOSIS — Z8249 Family history of ischemic heart disease and other diseases of the circulatory system: Secondary | ICD-10-CM | POA: Diagnosis not present

## 2017-09-24 DIAGNOSIS — I4892 Unspecified atrial flutter: Secondary | ICD-10-CM | POA: Diagnosis present

## 2017-09-24 DIAGNOSIS — I444 Left anterior fascicular block: Secondary | ICD-10-CM | POA: Diagnosis not present

## 2017-09-24 DIAGNOSIS — Z7901 Long term (current) use of anticoagulants: Secondary | ICD-10-CM | POA: Diagnosis not present

## 2017-09-24 DIAGNOSIS — K219 Gastro-esophageal reflux disease without esophagitis: Secondary | ICD-10-CM | POA: Diagnosis present

## 2017-09-24 DIAGNOSIS — I4891 Unspecified atrial fibrillation: Secondary | ICD-10-CM | POA: Diagnosis not present

## 2017-09-24 DIAGNOSIS — Z6830 Body mass index (BMI) 30.0-30.9, adult: Secondary | ICD-10-CM | POA: Diagnosis not present

## 2017-09-24 DIAGNOSIS — Z7984 Long term (current) use of oral hypoglycemic drugs: Secondary | ICD-10-CM | POA: Diagnosis not present

## 2017-09-24 DIAGNOSIS — E860 Dehydration: Secondary | ICD-10-CM | POA: Diagnosis not present

## 2017-09-24 DIAGNOSIS — E118 Type 2 diabetes mellitus with unspecified complications: Secondary | ICD-10-CM | POA: Diagnosis present

## 2017-09-24 DIAGNOSIS — I454 Nonspecific intraventricular block: Secondary | ICD-10-CM | POA: Diagnosis not present

## 2017-09-24 NOTE — Telephone Encounter (Signed)
Patient called in with c/o "diarrhea." He says "I've had diarrhea for 7-8 days. I saw Dr. Jenny Russo on Monday and he gave me a prescription for diarrhea, but it's not helping. It is just brown water coming out, about 4-9 times/day, sometimes more. I have vomited twice since the diarrhea started, the other day and today about 30 minutes ago and it was just yellow liquid. I have been trying to drink, but I'm not drinking enough, probably about 3-8oz bottles or a little more in the last 24 hours. I feel like I'm dehydrated. My urine is dark and I'm not urinating nearly as much as I should be or drinking as much as I should be. I really don't have the desire to drink anything. I had abdominal pain today right before I vomited and it was severe, a 22 on the pain scale, but as soon as I vomited, the pain went away." I asked about other symptoms, dizziness, fever, blood in stool, he denies all. According to protocol, go to ED, patient advised to go to ED, care advice given, patient verbalized understanding and says he will go.    Reason for Disposition . [1] Drinking very little AND [2] dehydration suspected (e.g., no urine > 12 hours, very dry mouth, very lightheaded)  Answer Assessment - Initial Assessment Questions 1. DIARRHEA SEVERITY: "How bad is the diarrhea?" "How many extra stools have you had in the past 24 hours than normal?"    - NO DIARRHEA (SCALE 0)   - MILD (SCALE 1-3): Few loose or mushy BMs; increase of 1-3 stools over normal daily number of stools; mild increase in ostomy output.   -  MODERATE (SCALE 4-7): Increase of 4-6 stools daily over normal; moderate increase in ostomy output. * SEVERE (SCALE 8-10; OR 'WORST POSSIBLE'): Increase of 7 or more stools daily over normal; moderate increase in ostomy output; incontinence.     Moderate to severe, it varies 2. ONSET: "When did the diarrhea begin?"      7 days 3. BM CONSISTENCY: "How loose or watery is the diarrhea?"      [place] 4. VOMITING: "Are  you also vomiting?" If so, ask: "How many times in the past 24 hours?"      Yes, twice since diarrhea started 5. ABDOMINAL PAIN: "Are you having any abdominal pain?" If yes: "What does it feel like?" (e.g., crampy, dull, intermittent, constant)      Not now, I was earlier then vomited and it relieved the pain. 6. ABDOMINAL PAIN SEVERITY: If present, ask: "How bad is the pain?"  (e.g., Scale 1-10; mild, moderate, or severe)   - MILD (1-3): doesn't interfere with normal activities, abdomen soft and not tender to touch    - MODERATE (4-7): interferes with normal activities or awakens from sleep, tender to touch    - SEVERE (8-10): excruciating pain, doubled over, unable to do any normal activities       Severe pain when it hits 7. ORAL INTAKE: If vomiting, "Have you been able to drink liquids?" "How much fluids have you had in the past 24 hours?"     Yes, more like 3 8oz or a little more than that 8. HYDRATION: "Any signs of dehydration?" (e.g., dry mouth [not just dry lips], too weak to stand, dizziness, new weight loss) "When did you last urinate?"     Not urinating enough; dark color 9. EXPOSURE: "Have you traveled to a foreign country recently?" "Have you been exposed to anyone with diarrhea?" "  Could you have eaten any food that was spoiled?"     No 10. ANTIBIOTIC USE: "Are you taking antibiotics now or have you taken antibiotics in the past 2 months?"       No 11. OTHER SYMPTOMS: "Do you have any other symptoms?" (e.g., fever, blood in stool)       No fever, no blood 12. PREGNANCY: "Is there any chance you are pregnant?" "When was your last menstrual period?"       N/A  Protocols used: DIARRHEA-A-AH

## 2017-09-26 NOTE — Telephone Encounter (Signed)
I note there GI panel appears to not have been accomplished or resulted.  I agree with ED referral

## 2017-09-28 LAB — GASTROINTESTINAL PATHOGEN PANEL PCR
C. difficile Tox A/B, PCR: NOT DETECTED
CAMPYLOBACTER, PCR: NOT DETECTED
Cryptosporidium, PCR: NOT DETECTED
E coli (ETEC) LT/ST PCR: NOT DETECTED
E coli (STEC) stx1/stx2, PCR: NOT DETECTED
E coli 0157, PCR: NOT DETECTED
Giardia lamblia, PCR: NOT DETECTED
Norovirus, PCR: NOT DETECTED
ROTAVIRUS, PCR: NOT DETECTED
SALMONELLA, PCR: NOT DETECTED
SHIGELLA, PCR: NOT DETECTED

## 2017-09-28 MED ORDER — AMLODIPINE BESYLATE 5 MG PO TABS
5.00 | ORAL_TABLET | ORAL | Status: DC
Start: 2017-09-29 — End: 2017-09-28

## 2017-09-28 MED ORDER — GUAIFENESIN-DM 100-10 MG/5ML PO SYRP
5.00 | ORAL_SOLUTION | ORAL | Status: DC
Start: ? — End: 2017-09-28

## 2017-09-28 MED ORDER — HYDRALAZINE HCL 20 MG/ML IJ SOLN
10.00 | INTRAMUSCULAR | Status: DC
Start: ? — End: 2017-09-28

## 2017-09-28 MED ORDER — PANTOPRAZOLE SODIUM 40 MG PO TBEC
40.00 | DELAYED_RELEASE_TABLET | ORAL | Status: DC
Start: 2017-09-28 — End: 2017-09-28

## 2017-09-28 MED ORDER — DILTIAZEM HCL ER COATED BEADS 120 MG PO CP24
120.00 | ORAL_CAPSULE | ORAL | Status: DC
Start: 2017-09-28 — End: 2017-09-28

## 2017-09-28 MED ORDER — FLECAINIDE ACETATE 100 MG PO TABS
100.00 | ORAL_TABLET | ORAL | Status: DC
Start: 2017-09-28 — End: 2017-09-28

## 2017-09-28 MED ORDER — LEVOTHYROXINE SODIUM 125 MCG PO TABS
125.00 | ORAL_TABLET | ORAL | Status: DC
Start: 2017-09-29 — End: 2017-09-28

## 2017-09-28 MED ORDER — INSULIN LISPRO 100 UNIT/ML ~~LOC~~ SOLN
2.00 | SUBCUTANEOUS | Status: DC
Start: 2017-09-28 — End: 2017-09-28

## 2017-09-28 MED ORDER — SODIUM BICARBONATE 650 MG PO TABS
650.00 | ORAL_TABLET | ORAL | Status: DC
Start: 2017-09-28 — End: 2017-09-28

## 2017-09-28 MED ORDER — GENERIC EXTERNAL MEDICATION
1.00 | Status: DC
Start: ? — End: 2017-09-28

## 2017-09-28 MED ORDER — ONDANSETRON HCL 4 MG/2ML IJ SOLN
4.00 | INTRAMUSCULAR | Status: DC
Start: ? — End: 2017-09-28

## 2017-09-28 MED ORDER — DEXTROSE 50 % IV SOLN
12.00 g | INTRAVENOUS | Status: DC
Start: ? — End: 2017-09-28

## 2017-09-28 MED ORDER — ACETAMINOPHEN 325 MG PO TABS
650.00 | ORAL_TABLET | ORAL | Status: DC
Start: ? — End: 2017-09-28

## 2017-09-28 MED ORDER — APIXABAN 5 MG PO TABS
5.00 | ORAL_TABLET | ORAL | Status: DC
Start: 2017-09-28 — End: 2017-09-28

## 2017-09-28 MED ORDER — GLUCOSE 40 % PO GEL
15.00 g | ORAL | Status: DC
Start: ? — End: 2017-09-28

## 2017-09-28 MED ORDER — GENERIC EXTERNAL MEDICATION
Status: DC
Start: ? — End: 2017-09-28

## 2017-09-29 ENCOUNTER — Telehealth: Payer: Self-pay | Admitting: *Deleted

## 2017-09-29 NOTE — Telephone Encounter (Signed)
PATIENT WAS ON PATIENT PING PORTAL  Presented at [facility]) emergency 09/24/17 FOR ABDOMINAL PAIN. Pt was  Admitted to [facility]) inpatient 09/24/17 - 6:10PM Primary Diagnosis: Generalized abdominal pain (R10). On  09/28/17 @ 11:44AM Discharged from [facility] inpatient to Hospital. Will call pt/family to follow-up on patient.Marland KitchenJohny Chess

## 2017-09-30 ENCOUNTER — Inpatient Hospital Stay: Payer: Medicare Other | Admitting: Internal Medicine

## 2017-09-30 DIAGNOSIS — L743 Miliaria, unspecified: Secondary | ICD-10-CM | POA: Diagnosis not present

## 2017-09-30 DIAGNOSIS — L570 Actinic keratosis: Secondary | ICD-10-CM | POA: Diagnosis not present

## 2017-09-30 DIAGNOSIS — B379 Candidiasis, unspecified: Secondary | ICD-10-CM | POA: Diagnosis not present

## 2017-09-30 NOTE — Telephone Encounter (Signed)
Transition Care Management Follow-up Telephone Call   Date discharged? 09/28/17   How have you been since you were released from the hospital? Pt states he is doing alright   Do you understand why you were in the hospital? YES   Do you understand the discharge instructions? YES, inform pt to bring d/c summary to appt   Where were you discharged to? Home   Items Reviewed:  Medications reviewed: YES, he states no changes  Allergies reviewed: YES  Dietary changes reviewed: YES heart healty  Referrals reviewed: No referral needed   Functional Questionnaire:   Activities of Daily Living (ADLs):   He states he are independent in the following: ambulation, bathing and hygiene, feeding, continence, grooming, toileting and dressing States he doesn't require assistance    Any transportation issues/concerns?: NO   Any patient concerns? NO   Confirmed importance and date/time of follow-up visits schedule YES, appt 10/02/17  Provider Appointment booked with [name]  Confirmed with patient if condition begins to worsen call PCP or go to the ER.  Patient was given the office number and encouraged to call back with question or concerns.  : YES

## 2017-10-02 ENCOUNTER — Inpatient Hospital Stay: Payer: Medicare Other | Admitting: Internal Medicine

## 2017-10-05 ENCOUNTER — Ambulatory Visit (INDEPENDENT_AMBULATORY_CARE_PROVIDER_SITE_OTHER): Payer: Medicare Other | Admitting: Internal Medicine

## 2017-10-05 ENCOUNTER — Encounter: Payer: Self-pay | Admitting: Internal Medicine

## 2017-10-05 ENCOUNTER — Other Ambulatory Visit (INDEPENDENT_AMBULATORY_CARE_PROVIDER_SITE_OTHER): Payer: Medicare Other

## 2017-10-05 VITALS — BP 126/74 | HR 55 | Temp 97.8°F | Ht 72.0 in | Wt 231.0 lb

## 2017-10-05 DIAGNOSIS — N179 Acute kidney failure, unspecified: Secondary | ICD-10-CM | POA: Diagnosis not present

## 2017-10-05 DIAGNOSIS — E876 Hypokalemia: Secondary | ICD-10-CM

## 2017-10-05 DIAGNOSIS — A09 Infectious gastroenteritis and colitis, unspecified: Secondary | ICD-10-CM | POA: Diagnosis not present

## 2017-10-05 DIAGNOSIS — R39198 Other difficulties with micturition: Secondary | ICD-10-CM | POA: Diagnosis not present

## 2017-10-05 LAB — CBC WITH DIFFERENTIAL/PLATELET
Basophils Absolute: 0 10*3/uL (ref 0.0–0.1)
Basophils Relative: 0.6 % (ref 0.0–3.0)
EOS PCT: 3.4 % (ref 0.0–5.0)
Eosinophils Absolute: 0.2 10*3/uL (ref 0.0–0.7)
HEMATOCRIT: 38.5 % — AB (ref 39.0–52.0)
Hemoglobin: 13.3 g/dL (ref 13.0–17.0)
LYMPHS ABS: 1.5 10*3/uL (ref 0.7–4.0)
Lymphocytes Relative: 20.4 % (ref 12.0–46.0)
MCHC: 34.7 g/dL (ref 30.0–36.0)
MCV: 87.9 fl (ref 78.0–100.0)
MONOS PCT: 7.7 % (ref 3.0–12.0)
Monocytes Absolute: 0.6 10*3/uL (ref 0.1–1.0)
NEUTROS ABS: 5 10*3/uL (ref 1.4–7.7)
NEUTROS PCT: 67.9 % (ref 43.0–77.0)
PLATELETS: 344 10*3/uL (ref 150.0–400.0)
RBC: 4.38 Mil/uL (ref 4.22–5.81)
RDW: 13.5 % (ref 11.5–15.5)
WBC: 7.3 10*3/uL (ref 4.0–10.5)

## 2017-10-05 LAB — BASIC METABOLIC PANEL
BUN: 19 mg/dL (ref 6–23)
CO2: 27 meq/L (ref 19–32)
Calcium: 8.9 mg/dL (ref 8.4–10.5)
Chloride: 104 mEq/L (ref 96–112)
Creatinine, Ser: 1.29 mg/dL (ref 0.40–1.50)
GFR: 57.08 mL/min — ABNORMAL LOW (ref >60.00)
GLUCOSE: 164 mg/dL — AB (ref 70–99)
Potassium: 3.3 mEq/L — ABNORMAL LOW (ref 3.5–5.1)
Sodium: 141 mEq/L (ref 135–145)

## 2017-10-05 MED ORDER — TAMSULOSIN HCL 0.4 MG PO CAPS: 0.4000 mg | ORAL_CAPSULE | Freq: Every day | ORAL | 3 refills | Status: DC

## 2017-10-05 NOTE — Assessment & Plan Note (Signed)
Also for f/u lab today 

## 2017-10-05 NOTE — Patient Instructions (Addendum)
Please take all new medication as prescribed - the flomax daily for about 7 days, then ok to stop if no more urinary trouble.  You can take it after that if you have symptoms that do not go away and the medication seems to help  Your swelling should resolved slowly in the next 1-2 wks  Please continue all other medications as before, and refills have been done if requested.  Please have the pharmacy call with any other refills you may need.  Please continue your efforts at being more active, low cholesterol diet, and weight control.  Please keep your appointments with your specialists as you may have planned  Please go to the LAB in the Basement (turn left off the elevator) for the tests to be done today  You will be contacted by phone if any changes need to be made immediately.  Otherwise, you will receive a letter about your results with an explanation, but please check with MyChart first.

## 2017-10-05 NOTE — Progress Notes (Signed)
Subjective:    Patient ID: Nathan Russo, male    DOB: May 04, 1940, 77 y.o.   MRN: [id number]  HPI  [facility] after [facility] for IVF for low volume/AKI related to infectious diarrhea proven by culture to be e coli and vibrio, and he was contacted by [facility] in conjunction with [facility];  tx with IV Invanz due to several allergies, and improved rapidly after that.  Had eaten locally at [employer] - shrimp.  Pt denies chest pain, increased sob or doe, wheezing, orthopnea, PND, increased LE swelling, palpitations, dizziness or syncope.  Denies worsening reflux, abd pain, dysphagia, n/v, or blood, diarrhea now almost resolved.   Pt denies polydipsia, polyuria.  After hospn still has swelling ankles and toes and hands but knows this will likely resolve.  Does have some urinary frequency and dribbling post hospnas well, with know BPH. Past Medical History:  Diagnosis Date  . Abdominal pain, epigastric 04/11/2010  . BELCHING 04/23/2010  . BRADYCARDIA, CHRONIC 10/24/2006  . CARPAL TUNNEL SYNDROME, BILATERAL 02/07/2009  . CHEST PAIN-UNSPECIFIED 09/05/2008  . CHOLELITHIASIS 04/22/2010  . Cholelithiasis 06/13/2010  . COLONIC POLYPS, HX OF 04/28/2007  . DEGENERATIVE JOINT DISEASE, RIGHT KNEE 10/24/2006  . Depression 02/23/2011  . DIABETES MELLITUS, TYPE II 04/28/2007  . Dizziness and giddiness 12/19/2009  . Elevated PSA 06/13/2010  . FATIGUE 04/28/2007  . GERD 02/11/2009  . Headache(784.0) 12/19/2009  . HYPERLIPIDEMIA 04/28/2007  . HYPERTENSION 10/21/2006  . NECK MASS 02/07/2010  . OBESITY 10/24/2006  . OTITIS MEDIA, ACUTE, LEFT 12/26/2009  . PHIMOSIS 02/07/2009  . S/P laparoscopic cholecystectomy 10/31/2010  . Thyroid cancer ([place]) 06/13/2010  . THYROID NODULE 02/07/2010   Past Surgical History:  Procedure Laterality Date  . CHOLECYSTECTOMY    . left knee surgery    . right wrist surgury    . THYROID SURGERY      reports that he has quit smoking. He has never used smokeless tobacco.  He reports that he does not drink alcohol or use drugs. family history includes Arthritis in his mother; Cancer in his brother and sister; Colon cancer (age of onset: 93) in his brother; Diabetes in his brother; Goiter in his mother; Heart attack (age of onset: 62) in his brother; Heart attack (age of onset: 60) in his father; Hypertension in his mother; Hypothyroidism in his sister. Allergies  Allergen Reactions  . Ace Inhibitors     REACTION: cough  . Atenolol     REACTION: bradycardia  . Codeine Other (See Comments)    Head spins "wild"  . Levaquin [Levofloxacin]     Interferes with flecainide  . Lovastatin     REACTION: myalygros  . Morphine Nausea And Vomiting  . Morphine And Related   . Statins   . Sulfa Antibiotics Other (See Comments)    As child almost died  . Sulfamethoxazole Other (See Comments)    Childhood unknown reaction  . Tramadol     Ants crawling all over   Current Outpatient Medications on File Prior to Visit  Medication Sig Dispense Refill  . amLODipine (NORVASC) 5 MG tablet Take by mouth.    [name] apixaban (ELIQUIS) 5 MG TABS tablet Take by mouth 2 (two) times daily.    . Ascorbic Acid (VITAMIN C) 1000 MG tablet Take 1,000 mg by mouth daily.      . blood glucose meter kit and supplies KIT Dispense based on patient and insurance preference. Use up to four times  daily as directed. (FOR ICD-9 250.00, 250.01). 1 each 0  . Calcium 500-125 MG-UNIT TABS 1 tablet daily.    . calcium carbonate (OS-CAL) 600 MG TABS Take 600 mg by mouth daily.      . Cinnamon 500 MG capsule Take 500 mg by mouth 2 (two) times daily.      [name] diltiazem (CARTIA XT) 120 MG 24 hr capsule Take 1 capsule (120 mg total) by mouth 2 (two) times daily. 180 capsule 3  . Flaxseed, Linseed, 1000 MG CAPS Take 1 capsule by mouth daily.    . flecainide (TAMBOCOR) 100 MG tablet Take 100 mg by mouth 2 (two) times daily.    . Garlic Oil (ODORLESS GARLIC) 878 MG TABS Take by mouth 2 (two) times daily.      .  Ginger, Zingiber officinalis, (GINGER PO) Take by mouth 4 (four) times daily.      . Glucosamine-Chondroit-Vit C-Mn (GLUCOSAMINE CHONDROITIN COMPLX) CAPS Take by mouth daily.      [name] glucose blood test strip Check glucose 2-3 times a day. E11.40 100 each 12  . levothyroxine (SYNTHROID, LEVOTHROID) 125 MCG tablet TAKE 1 TABLET ONE TIME DAILY BEFORE BREAKFAST 90 tablet 3  . losartan (COZAAR) 100 MG tablet Take 1 tablet (100 mg total) by mouth daily. 90 tablet 3  . metFORMIN (GLUCOPHAGE-XR) 500 MG 24 hr tablet TAKE 4 TABLETS EVERY DAY 360 tablet 3  . Misc Natural Products (OSTEO BI-FLEX TRIPLE STRENGTH) TABS Take 1 tablet by mouth.    . Multiple Vitamin (MULTIVITAMIN) capsule Take 1 capsule by mouth daily.      . Omega-3 Fatty Acids (FISH OIL) 1000 MG CAPS Take by mouth 2 (two) times daily.      [name] omeprazole (PRILOSEC) 20 MG capsule Take 1 capsule (20 mg total) by mouth 2 (two) times daily. 180 capsule 3  . triamcinolone (KENALOG) 0.025 % cream Apply 1 application topically 2 (two) times daily.    . Triamcinolone Acetonide (TRIAMCINOLONE 0.1 % CREAM : EUCERIN) CREA Apply 1 application topically.    [name] VITAMIN D, CHOLECALCIFEROL, PO Take by mouth daily.       No current facility-administered medications on file prior to visit.    Review of Systems  Constitutional: Negative for other unusual diaphoresis or sweats HENT: Negative for ear discharge or swelling Eyes: Negative for other worsening visual disturbances Respiratory: Negative for stridor or other swelling  Gastrointestinal: Negative for worsening distension or other blood Genitourinary: Negative for retention or other urinary change Musculoskeletal: Negative for other MSK pain or swelling Skin: Negative for color change or other new lesions Neurological: Negative for worsening tremors and other numbness  Psychiatric/Behavioral: Negative for worsening agitation or other fatigue All other system neg per pt    Objective:   Physical  Exam BP 126/74   Pulse (!) 55   Temp 97.8 F (36.6 C) (Oral)   Ht 6' (1.829 m)   Wt 231 lb (104.8 kg)   SpO2 95%   BMI 31.33 kg/m  VS noted,  Constitutional: Pt appears in NAD HENT: Head: NCAT.  Right Ear: External ear normal.  Left Ear: External ear normal.  Eyes: . Pupils are equal, round, and reactive to light. Conjunctivae and EOM are normal Nose: without d/c or deformity Neck: Neck supple. Gross normal ROM Cardiovascular: Normal rate and regular rhythm.   Pulmonary/Chest: Effort normal and breath sounds without rales or wheezing.  Abd:  Soft, NT, ND, + BS, no organomegaly Neurological: Pt is alert. At  baseline orientation, motor grossly intact Skin: Skin is warm. No rashes, other new lesions, no LE edema Psychiatric: Pt behavior is normal without agitation  No other exam findings Lab Results  Component Value Date   WBC 8.1 09/21/2017   HGB 14.5 09/21/2017   HCT 42.1 09/21/2017   PLT 305.0 09/21/2017   GLUCOSE 164 (H) 09/21/2017   CHOL 193 05/08/2017   TRIG 245.0 (H) 05/08/2017   HDL 32.70 (L) 05/08/2017   LDLDIRECT 122.0 05/08/2017   LDLCALC 113 (H) 11/04/2016   ALT 17 09/21/2017   AST 16 09/21/2017   NA 137 09/21/2017   K 3.8 09/21/2017   CL 107 09/21/2017   CREATININE 1.38 09/21/2017   BUN 22 09/21/2017   CO2 20 09/21/2017   TSH 1.65 09/21/2017   PSA 3.59 05/08/2017   INR 1.0 08/22/2008   HGBA1C 6.5 07/27/2017   MICROALBUR 1.1 11/04/2016       Assessment & Plan:

## 2017-10-05 NOTE — Assessment & Plan Note (Signed)
Near resolved,  to f/u any worsening symptoms or concerns

## 2017-10-05 NOTE — Assessment & Plan Note (Signed)
For f/u lab today,  to f/u any worsening symptoms or concerns 

## 2017-10-05 NOTE — Assessment & Plan Note (Signed)
Mild, likely related to recent illness, for flomax asd,  to f/u any worsening symptoms or concerns

## 2017-10-06 ENCOUNTER — Telehealth: Payer: Self-pay | Admitting: Internal Medicine

## 2017-10-06 NOTE — Telephone Encounter (Signed)
Copied from [place] [phone]. Topic: Quick Communication - See Telephone Encounter >> Oct 06, 2017  2:29 PM Neva Seat wrote: Pt is needing his tamsulosin (FLOMAX) 0.4 MG CAPS capsule called into [facility] due to the donut hole.

## 2017-10-07 ENCOUNTER — Telehealth: Payer: Self-pay

## 2017-10-07 ENCOUNTER — Other Ambulatory Visit: Payer: Self-pay | Admitting: Internal Medicine

## 2017-10-07 MED ORDER — TAMSULOSIN HCL 0.4 MG PO CAPS: 0.4000 mg | ORAL_CAPSULE | Freq: Every day | ORAL | 3 refills | Status: DC

## 2017-10-07 MED ORDER — POTASSIUM CHLORIDE ER 10 MEQ PO CPCR: 10.0000 meq | ORAL_CAPSULE | Freq: Every day | ORAL | 0 refills | Status: DC

## 2017-10-07 NOTE — Telephone Encounter (Signed)
done

## 2017-10-07 NOTE — Telephone Encounter (Signed)
Called pt, LVM with details below  

## 2017-10-07 NOTE — Telephone Encounter (Signed)
-----   Message from Biagio Borg, MD sent at 10/07/2017 12:52 PM EDT ----- Left message on MyChart, pt to cont same tx except  The test results show that your current treatment is OK, except the potassium is still mildly low.  Please take a potassium medication for 5 days and we can plan to recheck next visit.Redmond Baseman to please inform pt, I will do rx

## 2017-10-08 NOTE — Progress Notes (Addendum)
Subjective:   Nathan Russo is a 77 y.o. male who presents for Medicare Annual/Subsequent preventive examination.  Review of Systems:  No ROS.  Medicare Wellness Visit. Additional risk factors are reflected in the social history.  Cardiac Risk Factors include: advanced age (>46mn, >>65women);diabetes mellitus;dyslipidemia;hypertension;male gender Sleep patterns: has frequent nighttime awakenings, gets up 3-4 times nightly to void and sleeps 6-7 hours nightly.    Home Safety/Smoke Alarms: Feels safe in home. Smoke alarms in place.  Living environment; residence and Firearm Safety: 1-story house/ trailer, no firearms. Lives with wife, no needs for DME, good support system Seat Belt Safety/Bike Helmet: Wears seat belt.   PSA-  Lab Results  Component Value Date   PSA 3.59 05/08/2017   PSA 4.98 (H) 11/04/2016   PSA 3.17 11/07/2015       Objective:    Vitals: BP 136/68   Pulse (!) 55   Ht 6' (1.829 m)   Wt 231 lb (104.8 kg)   SpO2 98%   BMI 31.33 kg/m   Body mass index is 31.33 kg/m.  Advanced Directives 10/09/2017 05/21/2016 05/10/2015  Does Patient Have a Medical Advance Directive? Yes Yes Yes  Type of AParamedicof ABlairsLiving will HLukeLiving will -  Copy of [place]? No - copy requested No - copy requested No - copy requested    Tobacco Social History   Tobacco Use  Smoking Status Former Smoker  Smokeless Tobacco Never Used     Counseling given: Not Answered     Past Medical History:  Diagnosis Date  . Abdominal pain, epigastric 04/11/2010  . BELCHING 04/23/2010  . BRADYCARDIA, CHRONIC 10/24/2006  . CARPAL TUNNEL SYNDROME, BILATERAL 02/07/2009  . CHEST PAIN-UNSPECIFIED 09/05/2008  . CHOLELITHIASIS 04/22/2010  . Cholelithiasis 06/13/2010  . COLONIC POLYPS, HX OF 04/28/2007  . DEGENERATIVE JOINT DISEASE, RIGHT KNEE 10/24/2006  . Depression 02/23/2011  . DIABETES MELLITUS, TYPE II  04/28/2007  . Dizziness and giddiness 12/19/2009  . Elevated PSA 06/13/2010  . FATIGUE 04/28/2007  . GERD 02/11/2009  . Headache(784.0) 12/19/2009  . HYPERLIPIDEMIA 04/28/2007  . HYPERTENSION 10/21/2006  . NECK MASS 02/07/2010  . OBESITY 10/24/2006  . OTITIS MEDIA, ACUTE, LEFT 12/26/2009  . PHIMOSIS 02/07/2009  . S/P laparoscopic cholecystectomy 10/31/2010  . [place] 06/13[place]  . THYROID NODULE 02/07/2010   Past Surgical History:  Procedure Laterality Date  . CHOLECYSTECTOMY    . left knee surgery    . right wrist surgury    . THYROID SURGERY     Family History  Problem Relation Age of Onset  . Heart attack Brother 749 . Diabetes Brother   . Cancer Brother        colon  . Colon cancer Brother 646 . Hypertension Mother   . Arthritis Mother   . Goiter Mother   . Heart attack Father 873 . Cancer Sister        breast  . Hypothyroidism Sister    Social History   Socioeconomic History  . Marital status: Married    Spouse name: Not on file  . Number of children: 3  . Years of education: Not on file  . Highest education level: Not on file  Occupational History  . Occupation: former MProgrammer, systems RETIRED  Social Needs  . Financial resource strain: Not hard at all  . Food insecurity:    Worry: Never true  Inability: Never true  . Transportation needs:    Medical: No    Non-medical: No  Tobacco Use  . Smoking status: Former Research scientist (life sciences)  . Smokeless tobacco: Never Used  Substance and Sexual Activity  . Alcohol use: No    Alcohol/week: 0.0 oz  . Drug use: No  . Sexual activity: Not Currently  Lifestyle  . Physical activity:    Days per week: 3 days    Minutes per session: 20 min  . Stress: Not at all  Relationships  . Social connections:    Talks on phone: More than three times a week    Gets together: More than three times a week    Attends religious service: More than 4 times per year    Active member of club or  organization: Yes    Attends meetings of clubs or organizations: More than 4 times per year    Relationship status: Married  Other Topics Concern  . Not on file  Social History Narrative  . Not on file    Outpatient Encounter Medications as of 10/09/2017  Medication Sig  . amLODipine (NORVASC) 5 MG tablet Take by mouth.  [name] apixaban (ELIQUIS) 5 MG TABS tablet Take by mouth 2 (two) times daily.  . Ascorbic Acid (VITAMIN C) 1000 MG tablet Take 1,000 mg by mouth daily.    . blood glucose meter kit and supplies KIT Dispense based on patient and insurance preference. Use up to four times daily as directed. (FOR ICD-9 250.00, 250.01).  . Calcium 500-125 MG-UNIT TABS 1 tablet daily.  . calcium carbonate (OS-CAL) 600 MG TABS Take 600 mg by mouth daily.    . Cinnamon 500 MG capsule Take 500 mg by mouth 2 (two) times daily.    [name] diltiazem (CARTIA XT) 120 MG 24 hr capsule Take 1 capsule (120 mg total) by mouth 2 (two) times daily.  . Flaxseed, Linseed, 1000 MG CAPS Take 1 capsule by mouth daily.  . flecainide (TAMBOCOR) 100 MG tablet Take 100 mg by mouth 2 (two) times daily.  . Garlic Oil (ODORLESS GARLIC) 741 MG TABS Take by mouth 2 (two) times daily.    . Ginger, Zingiber officinalis, (GINGER PO) Take by mouth 4 (four) times daily.    . Glucosamine-Chondroit-Vit C-Mn (GLUCOSAMINE CHONDROITIN COMPLX) CAPS Take by mouth daily.    [name] glucose blood test strip Check glucose 2-3 times a day. E11.40  . levothyroxine (SYNTHROID, LEVOTHROID) 125 MCG tablet TAKE 1 TABLET ONE TIME DAILY BEFORE BREAKFAST  . losartan (COZAAR) 100 MG tablet Take 1 tablet (100 mg total) by mouth daily.  . metFORMIN (GLUCOPHAGE-XR) 500 MG 24 hr tablet TAKE 4 TABLETS EVERY DAY  . Misc Natural Products (OSTEO BI-FLEX TRIPLE STRENGTH) TABS Take 1 tablet by mouth.  . Multiple Vitamin (MULTIVITAMIN) capsule Take 1 capsule by mouth daily.    . Omega-3 Fatty Acids (FISH OIL) 1000 MG CAPS Take by mouth 2 (two) times daily.    [name] omeprazole  (PRILOSEC) 20 MG capsule Take 1 capsule (20 mg total) by mouth 2 (two) times daily.  . potassium chloride (MICRO-K) 10 MEQ CR capsule Take 1 capsule (10 mEq total) by mouth daily for 5 days.  . tamsulosin (FLOMAX) 0.4 MG CAPS capsule Take 1 capsule (0.4 mg total) by mouth daily.  [name] triamcinolone (KENALOG) 0.025 % cream Apply 1 application topically 2 (two) times daily.  . Triamcinolone Acetonide (TRIAMCINOLONE 0.1 % CREAM : EUCERIN) CREA Apply 1 application topically.  [name] VITAMIN D,  CHOLECALCIFEROL, PO Take by mouth daily.     No facility-administered encounter medications on file as of 10/09/2017.     Activities of Daily Living In your present state of health, do you have any difficulty performing the following activities: 10/09/2017  Hearing? N  Vision? N  Difficulty concentrating or making decisions? N  Walking or climbing stairs? N  Dressing or bathing? N  Doing errands, shopping? N  Preparing Food and eating ? N  Using the Toilet? N  In the past six months, have you accidently leaked urine? N  Do you have problems with loss of bowel control? N  Managing your Medications? N  Managing your Finances? N  Housekeeping or managing your Housekeeping? N  Some recent data might be hidden    Patient Care Team: Biagio Borg, MD as PCP - General   Assessment:   This is a routine wellness examination for [place]. Physical assessment deferred to PCP.   Exercise Activities and Dietary recommendations Current Exercise Habits: Home exercise routine, Type of exercise: walking(stationary bike), Time (Minutes): 25, Frequency (Times/Week): 3, Weekly Exercise (Minutes/Week): 75, Intensity: Mild, Exercise limited by: orthopedic condition(s)  Diet (meal preparation, eat out, water intake, caffeinated beverages, dairy products, fruits and vegetables): in general, a "healthy" diet  , well balanced   Reviewed heart healthy and diabetic diet. Encouraged patient to increase daily water and healthy fluid  intake.  Goals      Patient Stated   . patient (pt-stated)     Wants to have left knee replacement 1981; goes back April 20th Trying to use bone on bone;         Other   . Patient Stated     Stay as healthy and as independent as possible. Continue to eat healthy, exercise, enjoy life and family.       Fall Risk Fall Risk  10/09/2017 05/08/2017 11/04/2016 05/21/2016 05/06/2016  Falls in the past year? No Yes Yes No Yes  Number falls in past yr: - 2 or more 2 or more - 2 or more  Comment - - - - walks with cane, fell on uneven ground  Injury with Fall? - (No Data) - - No  Comment - better now after PT with bike - - -    Depression Screen PHQ 2/9 Scores 10/09/2017 05/08/2017 05/21/2016 05/06/2016  PHQ - 2 Score 0 0 0 0    Cognitive Function       Ad8 score reviewed for issues:  Issues making decisions: no  Less interest in hobbies / activities: no  Repeats questions, stories (family complaining): no  Trouble using ordinary gadgets (microwave, computer, phone):no  Forgets the month or year: no  Mismanaging finances: no  Remembering appts: no  Daily problems with thinking and/or memory: no Ad8 score is= 0  Immunization History  Administered Date(s) Administered  . Influenza Split 12/13/2010  . Influenza Whole 12/09/2007, 12/13/2009  . Influenza, High Dose Seasonal PF 12/10/2014, 11/04/2016  . Influenza, Seasonal, Injecte, Preservative Fre 12/08/2012  . Influenza,inj,Quad PF,6+ Mos 11/07/2015  . Pneumococcal Conjugate-13 03/22/2013  . Pneumococcal Polysaccharide-23 01/08/2006  . Td 05/17/2009   Screening Tests Health Maintenance  Topic Date Due  . OPHTHALMOLOGY EXAM  05/08/2017  . INFLUENZA VACCINE  10/08/2017  . FOOT EXAM  05/09/2018  . TETANUS/TDAP  05/18/2019  . PNA vac Low Risk Adult  Completed      Plan:     Continue doing brain stimulating activities (puzzles, reading, adult coloring books, staying  active) to keep memory sharp.   Continue to eat  heart healthy diet (full of fruits, vegetables, whole grains, lean protein, water--limit salt, fat, and sugar intake) and increase physical activity as tolerated.  I have personally reviewed and noted the following in the patient's chart:   . Medical and social history . Use of alcohol, tobacco or illicit drugs  . Current medications and supplements . Functional ability and status . Nutritional status . Physical activity . Advanced directives . List of other physicians . Vitals . Screenings to include cognitive, depression, and falls . Referrals and appointments  In addition, I have reviewed and discussed with patient certain preventive protocols, quality metrics, and best practice recommendations. A written personalized care plan for preventive services as well as general preventive health recommendations were provided to patient.     Michiel Cowboy, RN  10/09/2017  Medical screening examination/treatment/procedure(s) were performed by non-physician practitioner and as supervising physician I was immediately available for consultation/collaboration. I agree with above. Cathlean Cower, MD

## 2017-10-09 ENCOUNTER — Ambulatory Visit (INDEPENDENT_AMBULATORY_CARE_PROVIDER_SITE_OTHER): Payer: Medicare Other | Admitting: *Deleted

## 2017-10-09 VITALS — BP 136/68 | HR 55 | Ht 72.0 in | Wt 231.0 lb

## 2017-10-09 DIAGNOSIS — Z0000 Encounter for general adult medical examination without abnormal findings: Secondary | ICD-10-CM | POA: Diagnosis not present

## 2017-10-09 NOTE — Patient Instructions (Addendum)
Continue doing brain stimulating activities (puzzles, reading, adult coloring books, staying active) to keep memory sharp.   Continue to eat heart healthy diet (full of fruits, vegetables, whole grains, lean protein, water--limit salt, fat, and sugar intake) and increase physical activity as tolerated.   Nathan Russo , Thank you for taking time to come for your Medicare Wellness Visit. I appreciate your ongoing commitment to your health goals. Please review the following plan we discussed and let me know if I can assist you in the future.   These are the goals we discussed: Goals      Patient Stated   . patient (pt-stated)     Wants to have left knee replacement 1981; goes back April 20th Trying to use bone on bone;         Other   . Patient Stated     Stay as healthy and as independent as possible. Continue to eat healthy, exercise, enjoy life and family.       This is a list of the screening recommended for you and due dates:  Health Maintenance  Topic Date Due  . Eye exam for diabetics  05/08/2017  . Flu Shot  10/08/2017  . Complete foot exam   05/09/2018  . Tetanus Vaccine  05/18/2019  . Pneumonia vaccines  Completed     Health Maintenance, Male A healthy lifestyle and preventive care is important for your health and wellness. Ask your health care provider about what schedule of regular examinations is right for you. What should I know about weight and diet? Eat a Healthy Diet  Eat plenty of vegetables, fruits, whole grains, low-fat dairy products, and lean protein.  Do not eat a lot of foods high in solid fats, added sugars, or salt.  Maintain a Healthy Weight Regular exercise can help you achieve or maintain a healthy weight. You should:  Do at least 150 minutes of exercise each week. The exercise should increase your heart rate and make you sweat (moderate-intensity exercise).  Do strength-training exercises at least twice a week.  Watch Your Levels of  Cholesterol and Blood Lipids  Have your blood tested for lipids and cholesterol every 5 years starting at 79 years of age. If you are at high risk for heart disease, you should start having your blood tested when you are 79 years old. You may need to have your cholesterol levels checked more often if: ? Your lipid or cholesterol levels are high. ? You are older than 79 years of age. ? You are at high risk for heart disease.  What should I know about cancer screening? Many types of cancers can be detected early and may often be prevented. Lung Cancer  You should be screened every year for lung cancer if: ? You are a current smoker who has smoked for at least 30 years. ? You are a former smoker who has quit within the past 15 years.  Talk to your health care provider about your screening options, when you should start screening, and how often you should be screened.  Colorectal Cancer  Routine colorectal cancer screening usually begins at 79 years of age and should be repeated every 5-10 years until you are 79 years old. You may need to be screened more often if early forms of precancerous polyps or small growths are found. Your health care provider may recommend screening at an earlier age if you have risk factors for colon cancer.  Your health care provider may recommend using  home test kits to check for hidden blood in the stool.  A small camera at the end of a tube can be used to examine your colon (sigmoidoscopy or colonoscopy). This checks for the earliest forms of colorectal cancer.  Prostate and Testicular Cancer  Depending on your age and overall health, your health care provider may do certain tests to screen for prostate and testicular cancer.  Talk to your health care provider about any symptoms or concerns you have about testicular or prostate cancer.  Skin Cancer  Check your skin from head to toe regularly.  Tell your health care provider about any new moles or changes  in moles, especially if: ? There is a change in a mole's size, shape, or color. ? You have a mole that is larger than a pencil eraser.  Always use sunscreen. Apply sunscreen liberally and repeat throughout the day.  Protect yourself by wearing long sleeves, pants, a wide-brimmed hat, and sunglasses when outside.  What should I know about heart disease, diabetes, and high blood pressure?  If you are 63-88 years of age, have your blood pressure checked every 3-5 years. If you are 53 years of age or older, have your blood pressure checked every year. You should have your blood pressure measured twice-once when you are at a hospital or clinic, and once when you are not at a hospital or clinic. Record the average of the two measurements. To check your blood pressure when you are not at a hospital or clinic, you can use: ? An automated blood pressure machine at a pharmacy. ? A home blood pressure monitor.  Talk to your health care provider about your target blood pressure.  If you are between 27-4 years old, ask your health care provider if you should take aspirin to prevent heart disease.  Have regular diabetes screenings by checking your fasting blood sugar level. ? If you are at a normal weight and have a low risk for diabetes, have this test once every three years after the age of 24. ? If you are overweight and have a high risk for diabetes, consider being tested at a younger age or more often.  A one-time screening for abdominal aortic aneurysm (AAA) by ultrasound is recommended for men aged 1-75 years who are current or former smokers. What should I know about preventing infection? Hepatitis B If you have a higher risk for hepatitis B, you should be screened for this virus. Talk with your health care provider to find out if you are at risk for hepatitis B infection. Hepatitis C Blood testing is recommended for:  Everyone born from [birth year] through 1967.  Anyone with known risk factors  for hepatitis C.  Sexually Transmitted Diseases (STDs)  You should be screened each year for STDs including gonorrhea and chlamydia if: ? You are sexually active and are younger than 79 years of age. ? You are older than 79 years of age and your health care provider tells you that you are at risk for this type of infection. ? Your sexual activity has changed since you were last screened and you are at an increased risk for chlamydia or gonorrhea. Ask your health care provider if you are at risk.  Talk with your health care provider about whether you are at high risk of being infected with HIV. Your health care provider may recommend a prescription medicine to help prevent HIV infection.  What else can I do?  Schedule regular health, dental, and eye  exams.  Stay current with your vaccines (immunizations).  Do not use any tobacco products, such as cigarettes, chewing tobacco, and e-cigarettes. If you need help quitting, ask your health care provider.  Limit alcohol intake to no more than 2 drinks per day. One drink equals 12 ounces of beer, 5 ounces of Murline Weigel, or 1 ounces of hard liquor.  Do not use street drugs.  Do not share needles.  Ask your health care provider for help if you need support or information about quitting drugs.  Tell your health care provider if you often feel depressed.  Tell your health care provider if you have ever been abused or do not feel safe at home. This information is not intended to replace advice given to you by your health care provider. Make sure you discuss any questions you have with your health care provider. Document Released: 08/23/2007 Document Revised: 10/24/2015 Document Reviewed: 11/28/2014 Elsevier Interactive Patient Education  [employer].

## 2017-11-10 ENCOUNTER — Ambulatory Visit (INDEPENDENT_AMBULATORY_CARE_PROVIDER_SITE_OTHER): Payer: Medicare Other | Admitting: Internal Medicine

## 2017-11-10 ENCOUNTER — Encounter: Payer: Self-pay | Admitting: Internal Medicine

## 2017-11-10 VITALS — BP 128/78 | HR 64 | Temp 97.7°F | Ht 72.0 in | Wt 223.0 lb

## 2017-11-10 DIAGNOSIS — E1140 Type 2 diabetes mellitus with diabetic neuropathy, unspecified: Secondary | ICD-10-CM

## 2017-11-10 DIAGNOSIS — E785 Hyperlipidemia, unspecified: Secondary | ICD-10-CM | POA: Diagnosis not present

## 2017-11-10 DIAGNOSIS — I10 Essential (primary) hypertension: Secondary | ICD-10-CM

## 2017-11-10 DIAGNOSIS — Z23 Encounter for immunization: Secondary | ICD-10-CM

## 2017-11-10 LAB — POCT GLYCOSYLATED HEMOGLOBIN (HGB A1C)
HBA1C, POC (CONTROLLED DIABETIC RANGE): 0 % (ref 0.0–7.0)
HBA1C, POC (PREDIABETIC RANGE): 0 % — AB (ref 5.7–6.4)
HEMOGLOBIN A1C: 5.8 % — AB (ref 4.0–5.6)
HbA1c POC (<> result, manual entry): 0 % (ref 4.0–5.6)

## 2017-11-10 NOTE — Patient Instructions (Addendum)
You had the flu shot today  Your A1c was OK today  Please continue all other medications as before, and refills have been done if requested.  Please have the pharmacy call with any other refills you may need.  Please continue your efforts at being more active, low cholesterol diet, and weight control.  You are otherwise up to date with prevention measures today.  Please keep your appointments with your specialists as you may have planned  Please return in 6 months, or sooner if needed

## 2017-11-10 NOTE — Assessment & Plan Note (Signed)
stable overall by history and exam, recent data reviewed with pt, and pt to continue medical treatment as before,  to f/u any worsening symptoms or concerns  

## 2017-11-10 NOTE — Progress Notes (Signed)
Subjective:    Patient ID: Nathan Russo, male    DOB: 1940-03-16, 77 y.o.   MRN: [id number]  HPI  Here to f/u; overall doing ok,  Pt denies chest pain, increasing sob or doe, wheezing, orthopnea, PND, increased LE swelling, palpitations, dizziness or syncope.  Pt denies new neurological symptoms such as new headache, or facial or extremity weakness or numbness.  Pt denies polydipsia, polyuria, or low sugar episode.  Pt states overall good compliance with meds, mostly trying to follow appropriate diet, but little exercise however.  Lost 8 lbs with better diet.   Wt Readings from Last 3 Encounters:  11/10/17 223 lb (101.2 kg)  10/09/17 231 lb (104.8 kg)  10/05/17 231 lb (104.8 kg)   Past Medical History:  Diagnosis Date  . Abdominal pain, epigastric 04/11/2010  . BELCHING 04/23/2010  . BRADYCARDIA, CHRONIC 10/24/2006  . CARPAL TUNNEL SYNDROME, BILATERAL 02/07/2009  . CHEST PAIN-UNSPECIFIED 09/05/2008  . CHOLELITHIASIS 04/22/2010  . Cholelithiasis 06/13/2010  . COLONIC POLYPS, HX OF 04/28/2007  . DEGENERATIVE JOINT DISEASE, RIGHT KNEE 10/24/2006  . Depression 02/23/2011  . DIABETES MELLITUS, TYPE II 04/28/2007  . Dizziness and giddiness 12/19/2009  . Elevated PSA 06/13/2010  . FATIGUE 04/28/2007  . GERD 02/11/2009  . Headache(784.0) 12/19/2009  . HYPERLIPIDEMIA 04/28/2007  . HYPERTENSION 10/21/2006  . NECK MASS 02/07/2010  . OBESITY 10/24/2006  . OTITIS MEDIA, ACUTE, LEFT 12/26/2009  . PHIMOSIS 02/07/2009  . S/P laparoscopic cholecystectomy 10/31/2010  . Thyroid cancer ([place]) 06/13/2010  . THYROID NODULE 02/07/2010   Past Surgical History:  Procedure Laterality Date  . CHOLECYSTECTOMY    . left knee surgery    . right wrist surgury    . THYROID SURGERY      reports that he has quit smoking. He has never used smokeless tobacco. He reports that he does not drink alcohol or use drugs. family history includes Arthritis in his mother; Cancer in his brother and sister; Colon cancer (age of onset: 88)  in his brother; Diabetes in his brother; Goiter in his mother; Heart attack (age of onset: 3) in his brother; Heart attack (age of onset: 40) in his father; Hypertension in his mother; Hypothyroidism in his sister. Allergies  Allergen Reactions  . Ace Inhibitors     REACTION: cough  . Atenolol     REACTION: bradycardia  . Codeine Other (See Comments)    Head spins "wild"  . Levaquin [Levofloxacin]     Interferes with flecainide  . Lovastatin     REACTION: myalygros  . Morphine Nausea And Vomiting  . Morphine And Related   . Statins   . Sulfa Antibiotics Other (See Comments)    As child almost died  . Sulfamethoxazole Other (See Comments)    Childhood unknown reaction  . Tramadol     Ants crawling all over   Current Outpatient Medications on File Prior to Visit  Medication Sig Dispense Refill  . amLODipine (NORVASC) 5 MG tablet Take by mouth.    [name] apixaban (ELIQUIS) 5 MG TABS tablet Take by mouth 2 (two) times daily.    . Ascorbic Acid (VITAMIN C) 1000 MG tablet Take 1,000 mg by mouth daily.      . blood glucose meter kit and supplies KIT Dispense based on patient and insurance preference. Use up to four times daily as directed. (FOR ICD-9 250.00, 250.01). 1 each 0  . Calcium 500-125 MG-UNIT TABS 1 tablet daily.    . calcium carbonate (OS-CAL) 600  MG TABS Take 600 mg by mouth daily.      . Cinnamon 500 MG capsule Take 500 mg by mouth 2 (two) times daily.      [name] diltiazem (CARTIA XT) 120 MG 24 hr capsule Take 1 capsule (120 mg total) by mouth 2 (two) times daily. 180 capsule 3  . Flaxseed, Linseed, 1000 MG CAPS Take 1 capsule by mouth daily.    . flecainide (TAMBOCOR) 100 MG tablet Take 100 mg by mouth 2 (two) times daily.    . Garlic Oil (ODORLESS GARLIC) 242 MG TABS Take by mouth 2 (two) times daily.      . Ginger, Zingiber officinalis, (GINGER PO) Take by mouth 4 (four) times daily.      . Glucosamine-Chondroit-Vit C-Mn (GLUCOSAMINE CHONDROITIN COMPLX) CAPS Take by mouth  daily.      [name] glucose blood test strip Check glucose 2-3 times a day. E11.40 100 each 12  . levothyroxine (SYNTHROID, LEVOTHROID) 125 MCG tablet TAKE 1 TABLET ONE TIME DAILY BEFORE BREAKFAST 90 tablet 3  . losartan (COZAAR) 100 MG tablet Take 1 tablet (100 mg total) by mouth daily. 90 tablet 3  . metFORMIN (GLUCOPHAGE-XR) 500 MG 24 hr tablet TAKE 4 TABLETS EVERY DAY 360 tablet 3  . Misc Natural Products (OSTEO BI-FLEX TRIPLE STRENGTH) TABS Take 1 tablet by mouth.    . Multiple Vitamin (MULTIVITAMIN) capsule Take 1 capsule by mouth daily.      . Omega-3 Fatty Acids (FISH OIL) 1000 MG CAPS Take by mouth 2 (two) times daily.      [name] omeprazole (PRILOSEC) 20 MG capsule Take 1 capsule (20 mg total) by mouth 2 (two) times daily. 180 capsule 3  . tamsulosin (FLOMAX) 0.4 MG CAPS capsule Take 1 capsule (0.4 mg total) by mouth daily. 30 capsule 3  . triamcinolone (KENALOG) 0.025 % cream Apply 1 application topically 2 (two) times daily.    . Triamcinolone Acetonide (TRIAMCINOLONE 0.1 % CREAM : EUCERIN) CREA Apply 1 application topically.    [name] VITAMIN D, CHOLECALCIFEROL, PO Take by mouth daily.      . potassium chloride (MICRO-K) 10 MEQ CR capsule Take 1 capsule (10 mEq total) by mouth daily for 5 days. 5 capsule 0   No current facility-administered medications on file prior to visit.    Review of Systems  Constitutional: Negative for other unusual diaphoresis or sweats HENT: Negative for ear discharge or swelling Eyes: Negative for other worsening visual disturbances Respiratory: Negative for stridor or other swelling  Gastrointestinal: Negative for worsening distension or other blood Genitourinary: Negative for retention or other urinary change Musculoskeletal: Negative for other MSK pain or swelling Skin: Negative for color change or other new lesions Neurological: Negative for worsening tremors and other numbness  Psychiatric/Behavioral: Negative for worsening agitation or other fatigue All other  system neg per pt    Objective:   Physical Exam BP 128/78   Pulse 64   Temp 97.7 F (36.5 C) (Oral)   Ht 6' (1.829 m)   Wt 223 lb (101.2 kg)   SpO2 95%   BMI 30.24 kg/m  VS noted,  Constitutional: Pt appears in NAD HENT: Head: NCAT.  Right Ear: External ear normal.  Left Ear: External ear normal.  Eyes: . Pupils are equal, round, and reactive to light. Conjunctivae and EOM are normal Nose: without d/c or deformity Neck: Neck supple. Gross normal ROM Cardiovascular: Normal rate and regular rhythm.   Pulmonary/Chest: Effort normal and breath sounds without rales or  wheezing.  Abd:  Soft, NT, ND, + BS, no organomegaly Neurological: Pt is alert. At baseline orientation, motor grossly intact Skin: Skin is warm. No rashes, other new lesions, no LE edema Psychiatric: Pt behavior is normal without agitation  No other exam findings  Lab Results  Component Value Date   WBC 7.3 10/05/2017   HGB 13.3 10/05/2017   HCT 38.5 (L) 10/05/2017   PLT 344.0 10/05/2017   GLUCOSE 164 (H) 10/05/2017   CHOL 193 05/08/2017   TRIG 245.0 (H) 05/08/2017   HDL 32.70 (L) 05/08/2017   LDLDIRECT 122.0 05/08/2017   LDLCALC 113 (H) 11/04/2016   ALT 17 09/21/2017   AST 16 09/21/2017   NA 141 10/05/2017   K 3.3 (L) 10/05/2017   CL 104 10/05/2017   CREATININE 1.29 10/05/2017   BUN 19 10/05/2017   CO2 27 10/05/2017   TSH 1.65 09/21/2017   PSA 3.59 05/08/2017   INR 1.0 08/22/2008   HGBA1C 6.5 07/27/2017   MICROALBUR 1.1 11/04/2016   POCT glycosylated hemoglobin (Hb A1C)  Order: [id number]  Status:  Final result Visible to patient:  No (Not Released) Dx:  Type 2 diabetes mellitus with diabeti...   Ref Range & Units 14:22 59moago  Hemoglobin A1C 4.0 - 5.6 % 5.8Abnormal   6.5            Assessment & Plan:

## 2017-11-25 ENCOUNTER — Ambulatory Visit (INDEPENDENT_AMBULATORY_CARE_PROVIDER_SITE_OTHER): Payer: Medicare Other | Admitting: Sports Medicine

## 2017-11-25 ENCOUNTER — Encounter: Payer: Self-pay | Admitting: Sports Medicine

## 2017-11-25 VITALS — BP 143/69 | HR 57 | Resp 15

## 2017-11-25 DIAGNOSIS — E1140 Type 2 diabetes mellitus with diabetic neuropathy, unspecified: Secondary | ICD-10-CM

## 2017-11-25 DIAGNOSIS — B351 Tinea unguium: Secondary | ICD-10-CM | POA: Diagnosis not present

## 2017-11-25 DIAGNOSIS — M79676 Pain in unspecified toe(s): Secondary | ICD-10-CM | POA: Diagnosis not present

## 2017-11-25 DIAGNOSIS — I739 Peripheral vascular disease, unspecified: Secondary | ICD-10-CM | POA: Diagnosis not present

## 2017-11-25 NOTE — Progress Notes (Signed)
Subjective: Nathan Russo is a 77 y.o. male patient with history of diabetes who returns to office today complaining of long, painful nails  while ambulating in shoes; unable to trim. Patient states that the glucose reading was 97 one day ago, last A1c 5.8. Patient denies any new changes in medication or new problems since last visit.  No other concerns.   States he saw PCP, [name] last week  Patient Active Problem List   Diagnosis Date Noted  . AKI (acute kidney injury) ([place]) 10/05/2017  . Hypokalemia 10/05/2017  . Infectious diarrhea 10/05/2017  . Slow urinary stream 10/05/2017  . Chronic right shoulder pain 09/21/2017  . Neuropathy 07/27/2017  . Leg abscess 03/04/2016  . Right leg pain 02/29/2016  . Cough 02/23/2016  . PAT (paroxysmal atrial tachycardia) ([place]) 09/07/2015  . Abnormal breath sounds 01/11/2015  . Otitis externa 08/02/2014  . Hypersomnia 09/14/2013  . Dyspnea on exertion 04/12/2013  . Hypotension 04/12/2013  . Community acquired pneumonia 04/06/2013  . Status post ablation of atrial fibrillation 03/17/2013  . Status post ablation of atrial flutter 03/17/2013  . Postoperative hypothyroidism 05/05/2012  . Papillary carcinoma of thyroid ([place]) 05/05/2012  . Essential hypertension 03/28/2012  . Atrial fibrillation and flutter ([place]) 03/27/2012  . Pain in wrist 09/03/2011  . Depression 02/23/2011  . Vertigo 02/23/2011  . Supraventricular arrhythmia 02/06/2011  . Chronic pain 12/13/2010  . History of cholecystectomy 10/31/2010  . Routine history and physical examination of adult 10/31/2010  . Bladder neck obstruction 06/13/2010  . Elevated prostate specific antigen (PSA) 06/13/2010  . Gastroesophageal reflux disease 02/11/2009  . CARPAL TUNNEL SYNDROME, BILATERAL 02/07/2009  . Diabetes mellitus ([place]) 04/28/2007  . Hyperlipidemia 04/28/2007  . Malaise and fatigue 04/28/2007  . History of colonic polyps 04/28/2007  . Adiposity 10/24/2006  . BRADYCARDIA, CHRONIC  10/24/2006  . Osteoarthritis 10/24/2006  . Cardiac arrhythmia 10/24/2006   Current Outpatient Medications on File Prior to Visit  Medication Sig Dispense Refill  . amLODipine (NORVASC) 5 MG tablet Take by mouth.    [name] apixaban (ELIQUIS) 5 MG TABS tablet Take by mouth 2 (two) times daily.    . Ascorbic Acid (VITAMIN C) 1000 MG tablet Take 1,000 mg by mouth daily.      . blood glucose meter kit and supplies KIT Dispense based on patient and insurance preference. Use up to four times daily as directed. (FOR ICD-9 250.00, 250.01). 1 each 0  . Calcium 500-125 MG-UNIT TABS 1 tablet daily.    . calcium carbonate (OS-CAL) 600 MG TABS Take 600 mg by mouth daily.      . Cinnamon 500 MG capsule Take 500 mg by mouth 2 (two) times daily.      [name] diltiazem (CARTIA XT) 120 MG 24 hr capsule Take 1 capsule (120 mg total) by mouth 2 (two) times daily. 180 capsule 3  . Flaxseed, Linseed, 1000 MG CAPS Take 1 capsule by mouth daily.    . flecainide (TAMBOCOR) 100 MG tablet Take 100 mg by mouth 2 (two) times daily.    . Garlic Oil (ODORLESS GARLIC) 161 MG TABS Take by mouth 2 (two) times daily.      . Ginger, Zingiber officinalis, (GINGER PO) Take by mouth 4 (four) times daily.      . Glucosamine-Chondroit-Vit C-Mn (GLUCOSAMINE CHONDROITIN COMPLX) CAPS Take by mouth daily.      [name] glucose blood test strip Check glucose 2-3 times a day. E11.40 100 each 12  . levothyroxine (SYNTHROID, LEVOTHROID) 125  MCG tablet TAKE 1 TABLET ONE TIME DAILY BEFORE BREAKFAST 90 tablet 3  . losartan (COZAAR) 100 MG tablet Take 1 tablet (100 mg total) by mouth daily. 90 tablet 3  . metFORMIN (GLUCOPHAGE-XR) 500 MG 24 hr tablet TAKE 4 TABLETS EVERY DAY 360 tablet 3  . Misc Natural Products (OSTEO BI-FLEX TRIPLE STRENGTH) TABS Take 1 tablet by mouth.    . Multiple Vitamin (MULTIVITAMIN) capsule Take 1 capsule by mouth daily.      . Omega-3 Fatty Acids (FISH OIL) 1000 MG CAPS Take by mouth 2 (two) times daily.      [name] omeprazole (PRILOSEC)  20 MG capsule Take 1 capsule (20 mg total) by mouth 2 (two) times daily. 180 capsule 3  . tamsulosin (FLOMAX) 0.4 MG CAPS capsule Take 1 capsule (0.4 mg total) by mouth daily. 30 capsule 3  . triamcinolone (KENALOG) 0.025 % cream Apply 1 application topically 2 (two) times daily.    . Triamcinolone Acetonide (TRIAMCINOLONE 0.1 % CREAM : EUCERIN) CREA Apply 1 application topically.    [name] VITAMIN D, CHOLECALCIFEROL, PO Take by mouth daily.      . potassium chloride (MICRO-K) 10 MEQ CR capsule Take 1 capsule (10 mEq total) by mouth daily for 5 days. 5 capsule 0   No current facility-administered medications on file prior to visit.    Allergies  Allergen Reactions  . Ace Inhibitors     REACTION: cough  . Atenolol     REACTION: bradycardia  . Codeine Other (See Comments)    Head spins "wild"  . Levaquin [Levofloxacin]     Interferes with flecainide  . Lovastatin     REACTION: myalygros  . Morphine Nausea And Vomiting  . Morphine And Related   . Statins   . Sulfa Antibiotics Other (See Comments)    As child almost died  . Sulfamethoxazole Other (See Comments)    Childhood unknown reaction  . Tramadol     Ants crawling all over    Recent Results (from the past 2160 hour(s))  T4, free     Status: None   Collection Time: 09/21/17  5:08 PM  Result Value Ref Range   Free T4 1.11 0.60 - 1.60 ng/dL    Comment: Specimens from patients who are undergoing biotin therapy and /or ingesting biotin supplements may contain high levels of biotin.  The higher biotin concentration in these specimens interferes with this Free T4 assay.  Specimens that contain high levels  of biotin may cause false high results for this Free T4 assay.  Please interpret results in light of the total clinical presentation of the patient.    Basic metabolic panel     Status: Abnormal   Collection Time: 09/21/17  5:08 PM  Result Value Ref Range   Sodium 137 135 - 145 mEq/L   Potassium 3.8 3.5 - 5.1 mEq/L   Chloride  107 96 - 112 mEq/L   CO2 20 19 - 32 mEq/L   Glucose, Bld 164 (H) 70 - 99 mg/dL   BUN 22 6 - 23 mg/dL   Creatinine, Ser 1.38 0.40 - 1.50 mg/dL   Calcium 9.4 8.4 - 10.5 mg/dL   GFR 52.81 (L) >60.00 mL/min  Hepatic function panel     Status: None   Collection Time: 09/21/17  5:08 PM  Result Value Ref Range   Total Bilirubin 0.8 0.2 - 1.2 mg/dL   Bilirubin, Direct 0.0 0.0 - 0.3 mg/dL   Alkaline Phosphatase 56 39 - 117  U/L   AST 16 0 - 37 U/L   ALT 17 0 - 53 U/L   Total Protein 6.9 6.0 - 8.3 g/dL   Albumin 4.0 3.5 - 5.2 g/dL  CBC with Differential/Platelet     Status: None   Collection Time: 09/21/17  5:08 PM  Result Value Ref Range   WBC 8.1 4.0 - 10.5 K/uL   RBC 4.68 4.22 - 5.81 Mil/uL   Hemoglobin 14.5 13.0 - 17.0 g/dL   HCT 42.1 39.0 - 52.0 %   MCV 89.8 78.0 - 100.0 fl   MCHC 34.6 30.0 - 36.0 g/dL   RDW 13.7 11.5 - 15.5 %   Platelets 305.0 150.0 - 400.0 K/uL   Neutrophils Relative % 68.5 43.0 - 77.0 %   Lymphocytes Relative 19.3 12.0 - 46.0 %   Monocytes Relative 8.5 3.0 - 12.0 %   Eosinophils Relative 2.8 0.0 - 5.0 %   Basophils Relative 0.9 0.0 - 3.0 %   Neutro Abs 5.6 1.4 - 7.7 K/uL   Lymphs Abs 1.6 0.7 - 4.0 K/uL   Monocytes Absolute 0.7 0.1 - 1.0 K/uL   Eosinophils Absolute 0.2 0.0 - 0.7 K/uL   Basophils Absolute 0.1 0.0 - 0.1 K/uL  TSH     Status: None   Collection Time: 09/21/17  5:08 PM  Result Value Ref Range   TSH 1.65 0.35 - 4.50 uIU/mL  Gastrointestinal Pathogen Panel PCR     Status: None   Collection Time: 09/21/17  5:33 PM  Result Value Ref Range   Campylobacter, PCR NOT DETECTED NOT DETECT   C. difficile Tox A/B, PCR NOT DETECTED NOT DETECT   E coli 0157, PCR NOT DETECTED NOT DETECT   E coli (ETEC) LT/ST PCR NOT DETECTED NOT DETECT   E coli (STEC) stx1/stx2, PCR NOT DETECTED NOT DETECT   Salmonella, PCR NOT DETECTED NOT DETECT   Shigella, PCR NOT DETECTED NOT DETECT   Norovirus, PCR NOT DETECTED NOT DETECT   Rotavirus A, PCR NOT DETECTED NOT DETECT    Giardia lamblia, PCR NOT DETECTED NOT DETECT   Cryptosporidium, PCR NOT DETECTED NOT DETECT    Comment: . The xTAG(R) Gastrointestinal Pathogen Panel results are presumptive and must be confirmed by FDA-cleared tests or other acceptable reference methods. The results of this test should not be used as the sole basis for diagnosis, treatment, or other patient management decisions. . Performed using the [employer]x xTAG(R) Gastrointestinal Pathogen Panel test kit.   Basic metabolic panel     Status: Abnormal   Collection Time: 10/05/17  2:00 PM  Result Value Ref Range   Sodium 141 135 - 145 mEq/L   Potassium 3.3 (L) 3.5 - 5.1 mEq/L   Chloride 104 96 - 112 mEq/L   CO2 27 19 - 32 mEq/L   Glucose, Bld 164 (H) 70 - 99 mg/dL   BUN 19 6 - 23 mg/dL   Creatinine, Ser 1.29 0.40 - 1.50 mg/dL   Calcium 8.9 8.4 - 10.5 mg/dL   GFR 57.08 (L) >60.00 mL/min  CBC with Differential/Platelet     Status: Abnormal   Collection Time: 10/05/17  2:00 PM  Result Value Ref Range   WBC 7.3 4.0 - 10.5 K/uL   RBC 4.38 4.22 - 5.81 Mil/uL   Hemoglobin 13.3 13.0 - 17.0 g/dL   HCT 38.5 (L) 39.0 - 52.0 %   MCV 87.9 78.0 - 100.0 fl   MCHC 34.7 30.0 - 36.0 g/dL   RDW 13.5 11.5 -  15.5 %   Platelets 344.0 150.0 - 400.0 K/uL   Neutrophils Relative % 67.9 43.0 - 77.0 %   Lymphocytes Relative 20.4 12.0 - 46.0 %   Monocytes Relative 7.7 3.0 - 12.0 %   Eosinophils Relative 3.4 0.0 - 5.0 %   Basophils Relative 0.6 0.0 - 3.0 %   Neutro Abs 5.0 1.4 - 7.7 K/uL   Lymphs Abs 1.5 0.7 - 4.0 K/uL   Monocytes Absolute 0.6 0.1 - 1.0 K/uL   Eosinophils Absolute 0.2 0.0 - 0.7 K/uL   Basophils Absolute 0.0 0.0 - 0.1 K/uL  POCT glycosylated hemoglobin (Hb A1C)     Status: Abnormal   Collection Time: 11/10/17  2:22 PM  Result Value Ref Range   Hemoglobin A1C 5.8 (A) 4.0 - 5.6 %   HbA1c POC (<> result, manual entry) 0 4.0 - 5.6 %   HbA1c, POC (prediabetic range) 0 (A) 5.7 - 6.4 %   HbA1c, POC (controlled diabetic range) 0.0  0.0 - 7.0 %    Objective: General: Patient is awake, alert, and oriented x 3 and in no acute distress.  Integument: Skin is warm, dry and supple bilateral. Nails are tender, long, thickened and dystrophic with subungual debris, consistent with onychomycosis, 1-5 bilateral. No signs of infection. No open lesions or preulcerative lesions present bilateral. Remaining integument unremarkable.  Vasculature:  Dorsalis Pedis pulse 1/4 bilateral. Posterior Tibial pulse  0/4 bilateral due to trace edema at ankles. Capillary fill time <5 sec 1-5 bilateral. Scant hair growth to the level of the digits.Temperature gradient within normal limits. Mild varicosities present bilateral. Trace edema present bilateral.   Neurology: The patient has intact sensation measured with a 5.07/10g Semmes Weinstein Monofilament at all pedal sites bilateral. Vibratory sensation diminished bilateral with tuning fork. No Babinski sign present bilateral.   Musculoskeletal: No symptomatic pedal deformities noted bilateral. Muscular strength 5/5 in all lower extremity muscular groups bilateral without pain on range of motion . No tenderness with calf compression bilateral.  Assessment and Plan: Problem List Items Addressed This Visit      Endocrine   Diabetes mellitus ([place])    Other Visit Diagnoses    Pain due to onychomycosis of toenail    -  Primary   PVD (peripheral vascular disease) ([place])          -Examined patient. -Discussed and educated patient on diabetic foot care, especially with  regards to the vascular, neurological and musculoskeletal systems.  -Mechanically debrided all nails 1-5 bilateral using sterile nail nipper and filed with dremel without incident  -Continue with elevation to assist with edema control  -Patient to return  in 3 months for at risk foot care -Patient advised to call the office if any problems or questions arise in the meantime.  Landis Martins, DPM

## 2017-12-03 DIAGNOSIS — M25511 Pain in right shoulder: Secondary | ICD-10-CM | POA: Diagnosis not present

## 2017-12-03 DIAGNOSIS — M75121 Complete rotator cuff tear or rupture of right shoulder, not specified as traumatic: Secondary | ICD-10-CM | POA: Diagnosis not present

## 2017-12-15 DIAGNOSIS — Z719 Counseling, unspecified: Secondary | ICD-10-CM | POA: Diagnosis not present

## 2017-12-15 DIAGNOSIS — R002 Palpitations: Secondary | ICD-10-CM | POA: Diagnosis not present

## 2017-12-17 DIAGNOSIS — L570 Actinic keratosis: Secondary | ICD-10-CM | POA: Diagnosis not present

## 2017-12-17 DIAGNOSIS — D224 Melanocytic nevi of scalp and neck: Secondary | ICD-10-CM | POA: Diagnosis not present

## 2017-12-17 DIAGNOSIS — D220 Melanocytic nevi of lip: Secondary | ICD-10-CM | POA: Diagnosis not present

## 2017-12-17 DIAGNOSIS — T148XXA Other injury of unspecified body region, initial encounter: Secondary | ICD-10-CM | POA: Diagnosis not present

## 2017-12-17 DIAGNOSIS — B351 Tinea unguium: Secondary | ICD-10-CM | POA: Diagnosis not present

## 2017-12-17 DIAGNOSIS — B359 Dermatophytosis, unspecified: Secondary | ICD-10-CM | POA: Diagnosis not present

## 2017-12-17 DIAGNOSIS — Z85828 Personal history of other malignant neoplasm of skin: Secondary | ICD-10-CM | POA: Diagnosis not present

## 2017-12-17 DIAGNOSIS — D485 Neoplasm of uncertain behavior of skin: Secondary | ICD-10-CM | POA: Diagnosis not present

## 2017-12-17 DIAGNOSIS — L409 Psoriasis, unspecified: Secondary | ICD-10-CM | POA: Diagnosis not present

## 2017-12-17 DIAGNOSIS — L821 Other seborrheic keratosis: Secondary | ICD-10-CM | POA: Diagnosis not present

## 2017-12-17 DIAGNOSIS — L700 Acne vulgaris: Secondary | ICD-10-CM | POA: Diagnosis not present

## 2017-12-17 DIAGNOSIS — Z23 Encounter for immunization: Secondary | ICD-10-CM | POA: Diagnosis not present

## 2017-12-18 ENCOUNTER — Encounter: Payer: Self-pay | Admitting: Nurse Practitioner

## 2017-12-18 ENCOUNTER — Ambulatory Visit (INDEPENDENT_AMBULATORY_CARE_PROVIDER_SITE_OTHER): Payer: Medicare Other | Admitting: Nurse Practitioner

## 2017-12-18 ENCOUNTER — Other Ambulatory Visit (INDEPENDENT_AMBULATORY_CARE_PROVIDER_SITE_OTHER): Payer: Medicare Other

## 2017-12-18 VITALS — BP 128/70 | HR 70 | Ht 72.0 in | Wt 220.0 lb

## 2017-12-18 DIAGNOSIS — I10 Essential (primary) hypertension: Secondary | ICD-10-CM | POA: Diagnosis not present

## 2017-12-18 LAB — BASIC METABOLIC PANEL
BUN: 34 mg/dL — ABNORMAL HIGH (ref 6–23)
CHLORIDE: 106 meq/L (ref 96–112)
CO2: 24 mEq/L (ref 19–32)
Calcium: 9.5 mg/dL (ref 8.4–10.5)
Creatinine, Ser: 1.27 mg/dL (ref 0.40–1.50)
GFR: 58.08 mL/min — ABNORMAL LOW (ref >60.00)
Glucose, Bld: 154 mg/dL — ABNORMAL HIGH (ref 70–99)
Potassium: 3.9 mEq/L (ref 3.5–5.1)
SODIUM: 140 meq/L (ref 135–145)

## 2017-12-18 NOTE — Progress Notes (Signed)
Nathan Russo is a 77 y.o. male with the following history as recorded in EpicCare:  Patient Active Problem List   Diagnosis Date Noted  . AKI (acute kidney injury) ([place]) 10/05/2017  . Hypokalemia 10/05/2017  . Infectious diarrhea 10/05/2017  . Slow urinary stream 10/05/2017  . Chronic right shoulder pain 09/21/2017  . Neuropathy 07/27/2017  . Leg abscess 03/04/2016  . Right leg pain 02/29/2016  . Cough 02/23/2016  . PAT (paroxysmal atrial tachycardia) ([place]) 09/07/2015  . Abnormal breath sounds 01/11/2015  . Otitis externa 08/02/2014  . Hypersomnia 09/14/2013  . Dyspnea on exertion 04/12/2013  . Hypotension 04/12/2013  . Community acquired pneumonia 04/06/2013  . Status post ablation of atrial fibrillation 03/17/2013  . Status post ablation of atrial flutter 03/17/2013  . Postoperative hypothyroidism 05/05/2012  . Papillary carcinoma of thyroid ([place]) 05/05/2012  . Essential hypertension 03/28/2012  . Atrial fibrillation and flutter ([place]) 03/27/2012  . Pain in wrist 09/03/2011  . Depression 02/23/2011  . Vertigo 02/23/2011  . Supraventricular arrhythmia 02/06/2011  . Chronic pain 12/13/2010  . History of cholecystectomy 10/31/2010  . Routine history and physical examination of adult 10/31/2010  . Bladder neck obstruction 06/13/2010  . Elevated prostate specific antigen (PSA) 06/13/2010  . Gastroesophageal reflux disease 02/11/2009  . CARPAL TUNNEL SYNDROME, BILATERAL 02/07/2009  . Diabetes mellitus ([place]) 04/28/2007  . Hyperlipidemia 04/28/2007  . Malaise and fatigue 04/28/2007  . History of colonic polyps 04/28/2007  . Adiposity 10/24/2006  . BRADYCARDIA, CHRONIC 10/24/2006  . Osteoarthritis 10/24/2006  . Cardiac arrhythmia 10/24/2006    Current Outpatient Medications  Medication Sig Dispense Refill  . amLODipine (NORVASC) 5 MG tablet Take 2.5 mg by mouth 2 (two) times daily.     [name] apixaban (ELIQUIS) 5 MG TABS tablet Take by mouth 2 (two) times daily.    . Ascorbic  Acid (VITAMIN C) 1000 MG tablet Take 1,000 mg by mouth daily.      . blood glucose meter kit and supplies KIT Dispense based on patient and insurance preference. Use up to four times daily as directed. (FOR ICD-9 250.00, 250.01). 1 each 0  . Calcium 500-125 MG-UNIT TABS 1 tablet daily.    . calcium carbonate (OS-CAL) 600 MG TABS Take 600 mg by mouth daily.      . Cinnamon 500 MG capsule Take 500 mg by mouth 2 (two) times daily.      [name] diltiazem (CARTIA XT) 120 MG 24 hr capsule Take 1 capsule (120 mg total) by mouth 2 (two) times daily. 180 capsule 3  . Flaxseed, Linseed, 1000 MG CAPS Take 1 capsule by mouth daily.    . flecainide (TAMBOCOR) 100 MG tablet Take 100 mg by mouth 2 (two) times daily.    . Garlic Oil (ODORLESS GARLIC) 034 MG TABS Take by mouth 2 (two) times daily.      . Ginger, Zingiber officinalis, (GINGER PO) Take by mouth 4 (four) times daily.      . Glucosamine-Chondroit-Vit C-Mn (GLUCOSAMINE CHONDROITIN COMPLX) CAPS Take by mouth daily.      [name] glucose blood test strip Check glucose 2-3 times a day. E11.40 100 each 12  . levothyroxine (SYNTHROID, LEVOTHROID) 125 MCG tablet TAKE 1 TABLET ONE TIME DAILY BEFORE BREAKFAST 90 tablet 3  . losartan (COZAAR) 100 MG tablet Take 1 tablet (100 mg total) by mouth daily. 90 tablet 3  . metFORMIN (GLUCOPHAGE-XR) 500 MG 24 hr tablet TAKE 4 TABLETS EVERY DAY 360 tablet 3  . Misc Natural Products (  OSTEO BI-FLEX TRIPLE STRENGTH) TABS Take 1 tablet by mouth.    . Multiple Vitamin (MULTIVITAMIN) capsule Take 1 capsule by mouth daily.      . Omega-3 Fatty Acids (FISH OIL) 1000 MG CAPS Take by mouth 2 (two) times daily.      [name] omeprazole (PRILOSEC) 20 MG capsule Take 1 capsule (20 mg total) by mouth 2 (two) times daily. 180 capsule 3  . tamsulosin (FLOMAX) 0.4 MG CAPS capsule Take 1 capsule (0.4 mg total) by mouth daily. 30 capsule 3  . triamcinolone (KENALOG) 0.025 % cream Apply 1 application topically 2 (two) times daily.    . Triamcinolone  Acetonide (TRIAMCINOLONE 0.1 % CREAM : EUCERIN) CREA Apply 1 application topically.    [name] VITAMIN D, CHOLECALCIFEROL, PO Take by mouth daily.      . potassium chloride (MICRO-K) 10 MEQ CR capsule Take 1 capsule (10 mEq total) by mouth daily for 5 days. 5 capsule 0   No current facility-administered medications for this visit.     Allergies: Ace inhibitors; Atenolol; Codeine; Levaquin [levofloxacin]; Lovastatin; Morphine; Morphine and related; Statins; Sulfa antibiotics; Sulfamethoxazole; and Tramadol  Past Medical History:  Diagnosis Date  . Abdominal pain, epigastric 04/11/2010  . BELCHING 04/23/2010  . BRADYCARDIA, CHRONIC 10/24/2006  . CARPAL TUNNEL SYNDROME, BILATERAL 02/07/2009  . CHEST PAIN-UNSPECIFIED 09/05/2008  . CHOLELITHIASIS 04/22/2010  . Cholelithiasis 06/13/2010  . COLONIC POLYPS, HX OF 04/28/2007  . DEGENERATIVE JOINT DISEASE, RIGHT KNEE 10/24/2006  . Depression 02/23/2011  . DIABETES MELLITUS, TYPE II 04/28/2007  . Dizziness and giddiness 12/19/2009  . Elevated PSA 06/13/2010  . FATIGUE 04/28/2007  . GERD 02/11/2009  . Headache(784.0) 12/19/2009  . HYPERLIPIDEMIA 04/28/2007  . HYPERTENSION 10/21/2006  . NECK MASS 02/07/2010  . OBESITY 10/24/2006  . OTITIS MEDIA, ACUTE, LEFT 12/26/2009  . PHIMOSIS 02/07/2009  . S/P laparoscopic cholecystectomy 10/31/2010  . Thyroid cancer ([place]) 06/13/2010  . THYROID NODULE 02/07/2010    Past Surgical History:  Procedure Laterality Date  . CHOLECYSTECTOMY    . left knee surgery    . right wrist surgury    . THYROID SURGERY      Family History  Problem Relation Age of Onset  . Heart attack Brother 21  . Diabetes Brother   . Cancer Brother        colon  . Colon cancer Brother 98  . Hypertension Mother   . Arthritis Mother   . Goiter Mother   . Heart attack Father 30  . Cancer Sister        breast  . Hypothyroidism Sister     Social History   Tobacco Use  . Smoking status: Former Research scientist (life sciences)  . Smokeless tobacco: Never Used  Substance Use  Topics  . Alcohol use: No    Alcohol/week: 0.0 standard drinks     Subjective:  Nathan Russo is here today requesting evaluation of elevated blood pressure readings at home over past few weeks, checks with wrist cuff daily and has been having readings of 130s-180s/10s-110s recently. He is maintained on cardizem 120 BID, Losartan 100 daily and amlodipine 2.5, which was increased to twice daily dosing earlier this week by cardiology when he went in to see them for elevated HR, has increased amlodipine as instructed and scheduled cardiology follow up visit on 01/13/18. He tells me he overall feels well, denies syncope, confusion, headaches, vision changes, chest pain, shortness of breath, edema. He is very active, rides his bike 3 miles every am and pm and  feels fine.  BP Readings from Last 3 Encounters:  12/18/17 128/70  11/25/17 (!) 143/69  11/10/17 128/78    ROS- See HPI  Objective:  Vitals:   12/18/17 1420  BP: 128/70  Pulse: 70  SpO2: 96%  Weight: 220 lb (99.8 kg)  Height: 6' (1.829 m)    General: Well developed, well nourished, in no acute distress  Skin : Warm and dry.  Head: Normocephalic and atraumatic  Eyes: Sclera and conjunctiva clear; pupils round and reactive to light; extraocular movements intact  Oropharynx: Pink, supple. Neck: Supple  Lungs: Respirations unlabored; clear to auscultation bilaterally CVS exam: normal rate, regular rhythm. Extremities: No edema, cyanosis Vessels: Symmetric bilaterally  Neurologic: Alert and oriented; speech intact; face symmetrical; moves all extremities well; CNII-XII intact without focal deficit    Assessment:  1. Essential hypertension      Plan:   No follow-ups on file.  No orders of the defined types were placed in this encounter.   Requested Prescriptions    No prescriptions requested or ordered in this encounter

## 2017-12-18 NOTE — Assessment & Plan Note (Addendum)
BP stable in clinic today His home wrist cuff is 77 years old, may be getting incorrect readings Continue current meds Continue F/u with Cardiology and PCP as instructed 10/05/17 CBC WNL, BMET showed low K+- recheck today - Basic metabolic panel; Future

## 2017-12-18 NOTE — Patient Instructions (Addendum)
Labs downstairs today  Keep follow up with cardiology and Dr Jenny Reichmann as planned

## 2017-12-30 DIAGNOSIS — I440 Atrioventricular block, first degree: Secondary | ICD-10-CM | POA: Diagnosis not present

## 2017-12-30 DIAGNOSIS — Z85850 Personal history of malignant neoplasm of thyroid: Secondary | ICD-10-CM | POA: Diagnosis not present

## 2017-12-30 DIAGNOSIS — E890 Postprocedural hypothyroidism: Secondary | ICD-10-CM | POA: Diagnosis not present

## 2017-12-30 DIAGNOSIS — Z08 Encounter for follow-up examination after completed treatment for malignant neoplasm: Secondary | ICD-10-CM | POA: Diagnosis not present

## 2017-12-30 DIAGNOSIS — R9431 Abnormal electrocardiogram [ECG] [EKG]: Secondary | ICD-10-CM | POA: Diagnosis not present

## 2017-12-30 DIAGNOSIS — Z9089 Acquired absence of other organs: Secondary | ICD-10-CM | POA: Diagnosis not present

## 2017-12-30 DIAGNOSIS — Z87891 Personal history of nicotine dependence: Secondary | ICD-10-CM | POA: Diagnosis not present

## 2017-12-30 DIAGNOSIS — I499 Cardiac arrhythmia, unspecified: Secondary | ICD-10-CM | POA: Diagnosis not present

## 2017-12-30 DIAGNOSIS — Z79890 Hormone replacement therapy (postmenopausal): Secondary | ICD-10-CM | POA: Diagnosis not present

## 2018-01-07 DIAGNOSIS — C73 Malignant neoplasm of thyroid gland: Secondary | ICD-10-CM | POA: Diagnosis not present

## 2018-01-07 DIAGNOSIS — R918 Other nonspecific abnormal finding of lung field: Secondary | ICD-10-CM | POA: Diagnosis not present

## 2018-01-13 DIAGNOSIS — E118 Type 2 diabetes mellitus with unspecified complications: Secondary | ICD-10-CM | POA: Diagnosis not present

## 2018-01-13 DIAGNOSIS — Z8679 Personal history of other diseases of the circulatory system: Secondary | ICD-10-CM | POA: Diagnosis not present

## 2018-01-13 DIAGNOSIS — C73 Malignant neoplasm of thyroid gland: Secondary | ICD-10-CM | POA: Diagnosis not present

## 2018-01-13 DIAGNOSIS — I480 Paroxysmal atrial fibrillation: Secondary | ICD-10-CM | POA: Diagnosis not present

## 2018-01-13 DIAGNOSIS — Z9889 Other specified postprocedural states: Secondary | ICD-10-CM | POA: Diagnosis not present

## 2018-01-20 DIAGNOSIS — M8930 Hypertrophy of bone, unspecified site: Secondary | ICD-10-CM | POA: Diagnosis not present

## 2018-01-20 DIAGNOSIS — M8928 Other disorders of bone development and growth, other site: Secondary | ICD-10-CM | POA: Diagnosis not present

## 2018-01-28 DIAGNOSIS — C73 Malignant neoplasm of thyroid gland: Secondary | ICD-10-CM | POA: Diagnosis not present

## 2018-02-03 ENCOUNTER — Ambulatory Visit: Payer: PRIVATE HEALTH INSURANCE | Admitting: Sports Medicine

## 2018-02-12 ENCOUNTER — Encounter: Payer: Self-pay | Admitting: Sports Medicine

## 2018-02-12 ENCOUNTER — Ambulatory Visit (INDEPENDENT_AMBULATORY_CARE_PROVIDER_SITE_OTHER): Payer: Medicare Other | Admitting: Sports Medicine

## 2018-02-12 VITALS — BP 91/71 | HR 63 | Resp 16

## 2018-02-12 DIAGNOSIS — B351 Tinea unguium: Secondary | ICD-10-CM | POA: Diagnosis not present

## 2018-02-12 DIAGNOSIS — E1140 Type 2 diabetes mellitus with diabetic neuropathy, unspecified: Secondary | ICD-10-CM

## 2018-02-12 DIAGNOSIS — M79676 Pain in unspecified toe(s): Secondary | ICD-10-CM

## 2018-02-12 DIAGNOSIS — I739 Peripheral vascular disease, unspecified: Secondary | ICD-10-CM

## 2018-02-12 NOTE — Progress Notes (Signed)
Subjective: Nathan Russo is a 77 y.o. male patient with history of diabetes who returns to office today complaining of long, painful nails  while ambulating in shoes; unable to trim. Patient states that the glucose reading was 108 yesterday, last A1c 5.8. Patient denies any new changes in medication or new problems since last visit except finding 2 more nodules on his thyroid and lungs and will have to undergo radiation in January.  No other concerns.   Last saw PCP, [name] 2 months ago and will see him again next month  Patient Active Problem List   Diagnosis Date Noted  . AKI (acute kidney injury) ([place]) 10/05/2017  . Hypokalemia 10/05/2017  . Infectious diarrhea 10/05/2017  . Slow urinary stream 10/05/2017  . Chronic right shoulder pain 09/21/2017  . Neuropathy 07/27/2017  . Leg abscess 03/04/2016  . Right leg pain 02/29/2016  . Cough 02/23/2016  . PAT (paroxysmal atrial tachycardia) ([place]) 09/07/2015  . Abnormal breath sounds 01/11/2015  . Otitis externa 08/02/2014  . Hypersomnia 09/14/2013  . Dyspnea on exertion 04/12/2013  . Hypotension 04/12/2013  . Community acquired pneumonia 04/06/2013  . Status post ablation of atrial fibrillation 03/17/2013  . Status post ablation of atrial flutter 03/17/2013  . Postoperative hypothyroidism 05/05/2012  . Papillary carcinoma of thyroid ([place]) 05/05/2012  . Essential hypertension 03/28/2012  . Atrial fibrillation and flutter ([place]) 03/27/2012  . Pain in wrist 09/03/2011  . Depression 02/23/2011  . Vertigo 02/23/2011  . Supraventricular arrhythmia 02/06/2011  . Chronic pain 12/13/2010  . History of cholecystectomy 10/31/2010  . Routine history and physical examination of adult 10/31/2010  . Bladder neck obstruction 06/13/2010  . Elevated prostate specific antigen (PSA) 06/13/2010  . Gastroesophageal reflux disease 02/11/2009  . CARPAL TUNNEL SYNDROME, BILATERAL 02/07/2009  . Diabetes mellitus ([place]) 04/28/2007  . Hyperlipidemia  04/28/2007  . Malaise and fatigue 04/28/2007  . History of colonic polyps 04/28/2007  . Adiposity 10/24/2006  . BRADYCARDIA, CHRONIC 10/24/2006  . Osteoarthritis 10/24/2006  . Cardiac arrhythmia 10/24/2006   Current Outpatient Medications on File Prior to Visit  Medication Sig Dispense Refill  . amLODipine (NORVASC) 5 MG tablet Take 2.5 mg by mouth 2 (two) times daily.     [name] apixaban (ELIQUIS) 5 MG TABS tablet Take by mouth 2 (two) times daily.    . Ascorbic Acid (VITAMIN C) 1000 MG tablet Take 1,000 mg by mouth daily.      . blood glucose meter kit and supplies KIT Dispense based on patient and insurance preference. Use up to four times daily as directed. (FOR ICD-9 250.00, 250.01). 1 each 0  . Calcium 500-125 MG-UNIT TABS 1 tablet daily.    . calcium carbonate (OS-CAL) 600 MG TABS Take 600 mg by mouth daily.      . Cinnamon 500 MG capsule Take 500 mg by mouth 2 (two) times daily.      [name] diltiazem (CARTIA XT) 120 MG 24 hr capsule Take 1 capsule (120 mg total) by mouth 2 (two) times daily. 180 capsule 3  . Flaxseed, Linseed, 1000 MG CAPS Take 1 capsule by mouth daily.    . flecainide (TAMBOCOR) 100 MG tablet Take 100 mg by mouth 2 (two) times daily.    . Garlic Oil (ODORLESS GARLIC) 361 MG TABS Take by mouth 2 (two) times daily.      . Ginger, Zingiber officinalis, (GINGER PO) Take by mouth 4 (four) times daily.      . Glucosamine-Chondroit-Vit C-Mn (GLUCOSAMINE CHONDROITIN COMPLX) CAPS  Take by mouth daily.      [name] glucose blood test strip Check glucose 2-3 times a day. E11.40 100 each 12  . levothyroxine (SYNTHROID, LEVOTHROID) 125 MCG tablet TAKE 1 TABLET ONE TIME DAILY BEFORE BREAKFAST 90 tablet 3  . losartan (COZAAR) 100 MG tablet Take 1 tablet (100 mg total) by mouth daily. 90 tablet 3  . metFORMIN (GLUCOPHAGE-XR) 500 MG 24 hr tablet TAKE 4 TABLETS EVERY DAY 360 tablet 3  . Misc Natural Products (OSTEO BI-FLEX TRIPLE STRENGTH) TABS Take 1 tablet by mouth.    . Multiple Vitamin  (MULTIVITAMIN) capsule Take 1 capsule by mouth daily.      . Omega-3 Fatty Acids (FISH OIL) 1000 MG CAPS Take by mouth 2 (two) times daily.      [name] omeprazole (PRILOSEC) 20 MG capsule Take 1 capsule (20 mg total) by mouth 2 (two) times daily. 180 capsule 3  . potassium chloride (MICRO-K) 10 MEQ CR capsule Take 1 capsule (10 mEq total) by mouth daily for 5 days. 5 capsule 0  . tamsulosin (FLOMAX) 0.4 MG CAPS capsule Take 1 capsule (0.4 mg total) by mouth daily. 30 capsule 3  . triamcinolone (KENALOG) 0.025 % cream Apply 1 application topically 2 (two) times daily.    . Triamcinolone Acetonide (TRIAMCINOLONE 0.1 % CREAM : EUCERIN) CREA Apply 1 application topically.    [name] VITAMIN D, CHOLECALCIFEROL, PO Take by mouth daily.       No current facility-administered medications on file prior to visit.    Allergies  Allergen Reactions  . Ace Inhibitors     REACTION: cough  . Atenolol     REACTION: bradycardia  . Codeine Other (See Comments)    Head spins "wild"  . Levaquin [Levofloxacin]     Interferes with flecainide  . Lovastatin     REACTION: myalygros  . Morphine Nausea And Vomiting  . Morphine And Related   . Statins   . Sulfa Antibiotics Other (See Comments)    As child almost died  . Sulfamethoxazole Other (See Comments)    Childhood unknown reaction  . Tramadol     Ants crawling all over    Recent Results (from the past 2160 hour(s))  Basic metabolic panel     Status: Abnormal   Collection Time: 12/18/17  2:59 PM  Result Value Ref Range   Sodium 140 135 - 145 mEq/L   Potassium 3.9 3.5 - 5.1 mEq/L   Chloride 106 96 - 112 mEq/L   CO2 24 19 - 32 mEq/L   Glucose, Bld 154 (H) 70 - 99 mg/dL   BUN 34 (H) 6 - 23 mg/dL   Creatinine, Ser 1.27 0.40 - 1.50 mg/dL   Calcium 9.5 8.4 - 10.5 mg/dL   GFR 58.08 (L) >60.00 mL/min    Objective: General: Patient is awake, alert, and oriented x 3 and in no acute distress.  Integument: Skin is warm, dry and supple bilateral. Nails are  tender, long, thickened and dystrophic with subungual debris, consistent with onychomycosis, 1-5 bilateral. No signs of infection. No open lesions or preulcerative lesions present bilateral. Remaining integument unremarkable.  Vasculature:  Dorsalis Pedis pulse 1/4 bilateral. Posterior Tibial pulse  0/4 bilateral due to trace edema at ankles. Capillary fill time <5 sec 1-5 bilateral. Scant hair growth to the level of the digits.Temperature gradient within normal limits. Mild varicosities present bilateral. Trace edema present bilateral.   Neurology: The patient has intact sensation measured with a 5.07/10g Semmes Weinstein Monofilament at  all pedal sites bilateral. Vibratory sensation diminished bilateral with tuning fork. No Babinski sign present bilateral.   Musculoskeletal: No symptomatic pedal deformities noted bilateral. Muscular strength 5/5 in all lower extremity muscular groups bilateral without pain on range of motion . No tenderness with calf compression bilateral.  Assessment and Plan: Problem List Items Addressed This Visit      Endocrine   Diabetes mellitus ([place])    Other Visit Diagnoses    Pain due to onychomycosis of toenail    -  Primary   PVD (peripheral vascular disease) ([place])          -Examined patient. -Discussed and educated patient on diabetic foot care, especially with  regards to the vascular, neurological and musculoskeletal systems.  -Mechanically debrided all nails 1-5 bilateral using sterile nail nipper and filed with dremel without incident  -Patient to return  in 3 months for at risk foot care -Patient advised to call the office if any problems or questions arise in the meantime.  Landis Martins, DPM

## 2018-02-17 ENCOUNTER — Ambulatory Visit (INDEPENDENT_AMBULATORY_CARE_PROVIDER_SITE_OTHER): Payer: Medicare Other | Admitting: Internal Medicine

## 2018-02-17 ENCOUNTER — Encounter: Payer: Self-pay | Admitting: Internal Medicine

## 2018-02-17 VITALS — BP 122/74 | HR 67 | Temp 98.3°F | Ht 72.0 in | Wt 221.0 lb

## 2018-02-17 DIAGNOSIS — I10 Essential (primary) hypertension: Secondary | ICD-10-CM | POA: Diagnosis not present

## 2018-02-17 DIAGNOSIS — J069 Acute upper respiratory infection, unspecified: Secondary | ICD-10-CM | POA: Diagnosis not present

## 2018-02-17 DIAGNOSIS — E1140 Type 2 diabetes mellitus with diabetic neuropathy, unspecified: Secondary | ICD-10-CM

## 2018-02-17 MED ORDER — PROMETHAZINE-DM 6.25-15 MG/5ML PO SYRP: 5.0000 mL | ORAL_SOLUTION | Freq: Four times a day (QID) | ORAL | 0 refills | Status: DC | PRN

## 2018-02-17 MED ORDER — DOXYCYCLINE HYCLATE 100 MG PO TABS: 100.0000 mg | ORAL_TABLET | Freq: Two times a day (BID) | ORAL | 0 refills | Status: DC

## 2018-02-17 NOTE — Assessment & Plan Note (Signed)
Mild to mod, for antibx course,  to f/u any worsening symptoms or concerns 

## 2018-02-17 NOTE — Assessment & Plan Note (Signed)
stable overall by history and exam, recent data reviewed with pt, and pt to continue medical treatment as before,  to f/u any worsening symptoms or concerns  

## 2018-02-17 NOTE — Progress Notes (Signed)
Subjective:    Patient ID: Nathan Russo, male    DOB: Apr 12, 1940, 77 y.o.   MRN: [id number]  HPI   Here with 2-3 days acute onset fever, facial pain, pressure, headache, general weakness and malaise, and greenish d/c, with mild ST and cough, but pt denies chest pain, wheezing, increased sob or doe, orthopnea, PND, increased LE swelling, palpitations, dizziness or syncope.  Grandson also ill recently.  Due for thyroid cancer scan soon, quite concerned about that. Pt denies new neurological symptoms such as new headache, or facial or extremity weakness or numbness   Pt denies polydipsia, polyuria Past Medical History:  Diagnosis Date  . Abdominal pain, epigastric 04/11/2010  . BELCHING 04/23/2010  . BRADYCARDIA, CHRONIC 10/24/2006  . CARPAL TUNNEL SYNDROME, BILATERAL 02/07/2009  . CHEST PAIN-UNSPECIFIED 09/05/2008  . CHOLELITHIASIS 04/22/2010  . Cholelithiasis 06/13/2010  . COLONIC POLYPS, HX OF 04/28/2007  . DEGENERATIVE JOINT DISEASE, RIGHT KNEE 10/24/2006  . Depression 02/23/2011  . DIABETES MELLITUS, TYPE II 04/28/2007  . Dizziness and giddiness 12/19/2009  . Elevated PSA 06/13/2010  . FATIGUE 04/28/2007  . GERD 02/11/2009  . Headache(784.0) 12/19/2009  . HYPERLIPIDEMIA 04/28/2007  . HYPERTENSION 10/21/2006  . NECK MASS 02/07/2010  . OBESITY 10/24/2006  . OTITIS MEDIA, ACUTE, LEFT 12/26/2009  . PHIMOSIS 02/07/2009  . S/P laparoscopic cholecystectomy 10/31/2010  . Thyroid cancer ([place]) 06/13/2010  . THYROID NODULE 02/07/2010   Past Surgical History:  Procedure Laterality Date  . CHOLECYSTECTOMY    . left knee surgery    . right wrist surgury    . THYROID SURGERY      reports that he has quit smoking. He has never used smokeless tobacco. He reports that he does not drink alcohol or use drugs. family history includes Arthritis in his mother; Cancer in his brother and sister; Colon cancer (age of onset: 13) in his brother; Diabetes in his brother; Goiter in his mother; Heart attack (age of onset: 70)  in his brother; Heart attack (age of onset: 46) in his father; Hypertension in his mother; Hypothyroidism in his sister. Allergies  Allergen Reactions  . Diphenoxylate-Atropine Other (See Comments)  . Ace Inhibitors     REACTION: cough  . Atenolol     REACTION: bradycardia  . Codeine Other (See Comments)    Head spins "wild"  . Levaquin [Levofloxacin]     Interferes with flecainide  . Lovastatin     REACTION: myalygros  . Morphine Nausea And Vomiting  . Morphine And Related Other (See Comments)  . Statins Other (See Comments)    Causes body to ache  . Sulfa Antibiotics Other (See Comments)    As child almost died  . Sulfamethoxazole Other (See Comments)    Childhood unknown reaction  . Sulfasalazine Other (See Comments)    As child almost died  . Tramadol     Ants crawling all over   Current Outpatient Medications on File Prior to Visit  Medication Sig Dispense Refill  . amLODipine (NORVASC) 5 MG tablet Take 2.5 mg by mouth 2 (two) times daily.     [name] apixaban (ELIQUIS) 5 MG TABS tablet Take by mouth 2 (two) times daily.    . Ascorbic Acid (VITAMIN C) 1000 MG tablet Take 1,000 mg by mouth daily.      . blood glucose meter kit and supplies KIT Dispense based on patient and insurance preference. Use up to four times daily as directed. (FOR ICD-9 250.00, 250.01). 1 each 0  . Calcium  500-125 MG-UNIT TABS 1 tablet daily.    . calcium carbonate (OS-CAL) 600 MG TABS Take 600 mg by mouth daily.      . Cinnamon 500 MG capsule Take 500 mg by mouth 2 (two) times daily.      [name] diltiazem (CARTIA XT) 120 MG 24 hr capsule Take 1 capsule (120 mg total) by mouth 2 (two) times daily. 180 capsule 3  . Flaxseed, Linseed, 1000 MG CAPS Take 1 capsule by mouth daily.    . flecainide (TAMBOCOR) 100 MG tablet Take 100 mg by mouth 2 (two) times daily.    . Garlic Oil (ODORLESS GARLIC) 660 MG TABS Take by mouth 2 (two) times daily.      . Ginger, Zingiber officinalis, (GINGER PO) Take by mouth 4 (four)  times daily.      . Glucosamine-Chondroit-Vit C-Mn (GLUCOSAMINE CHONDROITIN COMPLX) CAPS Take by mouth daily.      [name] glucose blood test strip Check glucose 2-3 times a day. E11.40 100 each 12  . levothyroxine (SYNTHROID, LEVOTHROID) 125 MCG tablet TAKE 1 TABLET ONE TIME DAILY BEFORE BREAKFAST 90 tablet 3  . losartan (COZAAR) 100 MG tablet Take 1 tablet (100 mg total) by mouth daily. 90 tablet 3  . metFORMIN (GLUCOPHAGE-XR) 500 MG 24 hr tablet TAKE 4 TABLETS EVERY DAY 360 tablet 3  . Misc Natural Products (OSTEO BI-FLEX TRIPLE STRENGTH) TABS Take 1 tablet by mouth.    . Multiple Vitamin (MULTIVITAMIN) capsule Take 1 capsule by mouth daily.      . Omega-3 Fatty Acids (FISH OIL) 1000 MG CAPS Take by mouth 2 (two) times daily.      [name] omeprazole (PRILOSEC) 20 MG capsule Take 1 capsule (20 mg total) by mouth 2 (two) times daily. 180 capsule 3  . triamcinolone (KENALOG) 0.025 % cream Apply 1 application topically 2 (two) times daily.    . Triamcinolone Acetonide (TRIAMCINOLONE 0.1 % CREAM : EUCERIN) CREA Apply 1 application topically.    [name] VITAMIN D, CHOLECALCIFEROL, PO Take by mouth daily.      . potassium chloride (MICRO-K) 10 MEQ CR capsule Take 1 capsule (10 mEq total) by mouth daily for 5 days. 5 capsule 0   No current facility-administered medications on file prior to visit.    Review of Systems  Constitutional: Negative for other unusual diaphoresis or sweats HENT: Negative for ear discharge or swelling Eyes: Negative for other worsening visual disturbances Respiratory: Negative for stridor or other swelling  Gastrointestinal: Negative for worsening distension or other blood Genitourinary: Negative for retention or other urinary change Musculoskeletal: Negative for other MSK pain or swelling Skin: Negative for color change or other new lesions Neurological: Negative for worsening tremors and other numbness  Psychiatric/Behavioral: Negative for worsening agitation or other fatigue All  other system neg per pt    Objective:   Physical Exam BP 122/74   Pulse 67   Temp 98.3 F (36.8 C) (Oral)   Ht 6' (1.829 m)   Wt 221 lb (100.2 kg)   SpO2 93%   BMI 29.97 kg/m  VS noted, mild ill Constitutional: Pt appears in NAD HENT: Head: NCAT.  Right Ear: External ear normal.  Left Ear: External ear normal.  Eyes: . Pupils are equal, round, and reactive to light. Conjunctivae and EOM are normal Bilat tm's with mild erythema.  Max sinus areas mild tender.  Pharynx with mild erythema, no exudate Nose: without d/c or deformity Neck: Neck supple. Gross normal ROM Cardiovascular: Normal rate and  regular rhythm.   Pulmonary/Chest: Effort normal and breath sounds without rales or wheezing.  Neurological: Pt is alert. At baseline orientation, motor grossly intact Skin: Skin is warm. No rashes, other new lesions, no LE edema Psychiatric: Pt behavior is normal without agitation  No other exam findings Lab Results  Component Value Date   WBC 7.3 10/05/2017   HGB 13.3 10/05/2017   HCT 38.5 (L) 10/05/2017   PLT 344.0 10/05/2017   GLUCOSE 154 (H) 12/18/2017   CHOL 193 05/08/2017   TRIG 245.0 (H) 05/08/2017   HDL 32.70 (L) 05/08/2017   LDLDIRECT 122.0 05/08/2017   LDLCALC 113 (H) 11/04/2016   ALT 17 09/21/2017   AST 16 09/21/2017   NA 140 12/18/2017   K 3.9 12/18/2017   CL 106 12/18/2017   CREATININE 1.27 12/18/2017   BUN 34 (H) 12/18/2017   CO2 24 12/18/2017   TSH 1.65 09/21/2017   PSA 3.59 05/08/2017   INR 1.0 08/22/2008   HGBA1C 5.8 (A) 11/10/2017   HGBA1C 0 11/10/2017   HGBA1C 0 (A) 11/10/2017   HGBA1C 0.0 11/10/2017   MICROALBUR 1.1 11/04/2016       Assessment & Plan:

## 2018-02-17 NOTE — Patient Instructions (Signed)
Please take all new medication as prescribed - the antibiotic, and non narcotic cough medicine as needed  Please continue all other medications as before, and refills have been done if requested.  Please have the pharmacy call with any other refills you may need.  Please keep your appointments with your specialists as you may have planned  Good luck with your thyroid treatment!

## 2018-02-24 ENCOUNTER — Other Ambulatory Visit: Payer: Self-pay | Admitting: Internal Medicine

## 2018-02-24 MED ORDER — METFORMIN HCL ER 500 MG PO TB24: ORAL_TABLET | ORAL | 3 refills | Status: DC

## 2018-02-24 NOTE — Telephone Encounter (Signed)
Copied from [place] [id number]. Topic: Quick Communication - Rx Refill/Question >> Feb 24, 2018  9:51 AM Carolyn Stare wrote: Medication   metFORMIN (GLUCOPHAGE-XR) 500 MG 24 hr tablet  Has the patient contacted their pharmacy yes     Preferred Pharmacy   [facility]    Agent: Please be advised that RX refills may take up to 3 business days. We ask that you follow-up with your pharmacy.

## 2018-02-24 NOTE — Telephone Encounter (Signed)
Labs reviewed and reported as stable by A Shambley,NP in 10/19.  Requested Prescriptions  Pending Prescriptions Disp Refills  . metFORMIN (GLUCOPHAGE-XR) 500 MG 24 hr tablet 360 tablet 3    Sig: TAKE 4 TABLETS EVERY DAY     Endocrinology:  Diabetes - Biguanides Failed - 02/24/2018  9:54 AM      Failed - eGFR in normal range and within 360 days    GFR calc Af Amer  Date Value Ref Range Status  08/23/2008  >60 mL/min Final   >60        The eGFR has been calculated using the MDRD equation. This calculation has not been validated in all clinical situations. eGFR's persistently <60 mL/min signify possible Chronic Kidney Disease.   GFR calc non Af Amer  Date Value Ref Range Status  12/13/2009 56.77 >60 mL/min Final   GFR  Date Value Ref Range Status  12/18/2017 58.08 (L) >60.00 mL/min Final         Passed - Cr in normal range and within 360 days    Creatinine, Ser  Date Value Ref Range Status  12/18/2017 1.27 0.40 - 1.50 mg/dL Final         Passed - HBA1C is between 0 and 7.9 and within 180 days    Hemoglobin A1C  Date Value Ref Range Status  11/10/2017 5.8 (A) 4.0 - 5.6 % Final   HbA1c, POC (prediabetic range)  Date Value Ref Range Status  11/10/2017 0 (A) 5.7 - 6.4 % Final   HbA1c, POC (controlled diabetic range)  Date Value Ref Range Status  11/10/2017 0.0 0.0 - 7.0 % Final   HbA1c POC (<> result, manual entry)  Date Value Ref Range Status  11/10/2017 0 4.0 - 5.6 % Final         Passed - Valid encounter within last 6 months    Recent Outpatient Visits          1 week ago Acute upper respiratory infection   [place], James W, MD   2 months ago Essential hypertension   [place], Delphia Grates, NP   3 months ago Type 2 diabetes mellitus with diabetic neuropathy, without long-term current use of insulin [facility])   [facility] -Georges Mouse, MD   4 months ago AKI (acute  kidney injury) [facility])   [facility] -Georges Mouse, MD   5 months ago Diarrhea, unspecified type   [place], James W, MD      Future Appointments            In 2 months Jenny Reichmann, Hunt Oris, MD [place], [place]   In 7 months  [place], [facility]

## 2018-03-08 ENCOUNTER — Other Ambulatory Visit: Payer: Self-pay | Admitting: Internal Medicine

## 2018-03-08 MED ORDER — LEVOTHYROXINE SODIUM 125 MCG PO TABS: ORAL_TABLET | ORAL | 3 refills | Status: DC

## 2018-03-08 NOTE — Telephone Encounter (Signed)
Copied from [place] [id number]. Topic: Quick Communication - Rx Refill/Question >> Mar 08, 2018  8:03 AM Burchel, Abbi R wrote: Medication: levothyroxine (SYNTHROID, LEVOTHROID) 125 MCG tablet  Pt states he needs to have this rx refilled on this year's insurance plan, it will change on the 31st so he is requesting that this be sent in today is possible.   Preferred Pharmacy: [facility] [address] [place] [address] Phone: [phone] Fax: [phone]  Pt was advised that RX refills may take up to 3 business days. We ask that you follow-up with your pharmacy.

## 2018-03-22 DIAGNOSIS — E890 Postprocedural hypothyroidism: Secondary | ICD-10-CM | POA: Diagnosis not present

## 2018-03-22 DIAGNOSIS — C73 Malignant neoplasm of thyroid gland: Secondary | ICD-10-CM | POA: Diagnosis not present

## 2018-03-22 DIAGNOSIS — C770 Secondary and unspecified malignant neoplasm of lymph nodes of head, face and neck: Secondary | ICD-10-CM | POA: Diagnosis not present

## 2018-03-22 DIAGNOSIS — R918 Other nonspecific abnormal finding of lung field: Secondary | ICD-10-CM | POA: Diagnosis not present

## 2018-03-23 DIAGNOSIS — C73 Malignant neoplasm of thyroid gland: Secondary | ICD-10-CM | POA: Diagnosis not present

## 2018-03-23 DIAGNOSIS — R918 Other nonspecific abnormal finding of lung field: Secondary | ICD-10-CM | POA: Diagnosis not present

## 2018-03-23 DIAGNOSIS — E890 Postprocedural hypothyroidism: Secondary | ICD-10-CM | POA: Diagnosis not present

## 2018-03-24 DIAGNOSIS — C73 Malignant neoplasm of thyroid gland: Secondary | ICD-10-CM | POA: Diagnosis not present

## 2018-03-30 DIAGNOSIS — C73 Malignant neoplasm of thyroid gland: Secondary | ICD-10-CM | POA: Diagnosis not present

## 2018-03-30 DIAGNOSIS — R918 Other nonspecific abnormal finding of lung field: Secondary | ICD-10-CM | POA: Diagnosis not present

## 2018-03-30 DIAGNOSIS — R221 Localized swelling, mass and lump, neck: Secondary | ICD-10-CM | POA: Diagnosis not present

## 2018-04-09 ENCOUNTER — Ambulatory Visit (INDEPENDENT_AMBULATORY_CARE_PROVIDER_SITE_OTHER): Payer: Medicare Other | Admitting: Internal Medicine

## 2018-04-09 ENCOUNTER — Encounter: Payer: Self-pay | Admitting: Internal Medicine

## 2018-04-09 ENCOUNTER — Other Ambulatory Visit: Payer: Self-pay | Admitting: Internal Medicine

## 2018-04-09 VITALS — BP 124/64 | HR 60 | Temp 98.5°F | Ht 72.0 in | Wt 221.0 lb

## 2018-04-09 DIAGNOSIS — I10 Essential (primary) hypertension: Secondary | ICD-10-CM

## 2018-04-09 DIAGNOSIS — R05 Cough: Secondary | ICD-10-CM | POA: Diagnosis not present

## 2018-04-09 DIAGNOSIS — E1140 Type 2 diabetes mellitus with diabetic neuropathy, unspecified: Secondary | ICD-10-CM

## 2018-04-09 DIAGNOSIS — R059 Cough, unspecified: Secondary | ICD-10-CM

## 2018-04-09 MED ORDER — BLOOD GLUCOSE MONITORING SUPPL SUPPLIES MISC: 3 refills | Status: DC

## 2018-04-09 NOTE — Progress Notes (Signed)
Subjective:    Patient ID: MINOR IDEN, male    DOB: Nov 05, 1940, 78 y.o.   MRN: [id number]  HPI  Here with acute onset mild to mod 2-3 days ST, HA, general weakness and malaise, with prod cough greenish sputum, but Pt denies chest pain, increased sob or doe, wheezing, orthopnea, PND, increased LE swelling, palpitations, dizziness or syncope.  Has finished recent XRT for thyroid cancer.  No other new complaints  Pt denies new neurological symptoms such as new headache, or facial or extremity weakness or numbness   Pt denies polydipsia, polyuria Past Medical History:  Diagnosis Date  . Abdominal pain, epigastric 04/11/2010  . BELCHING 04/23/2010  . BRADYCARDIA, CHRONIC 10/24/2006  . CARPAL TUNNEL SYNDROME, BILATERAL 02/07/2009  . CHEST PAIN-UNSPECIFIED 09/05/2008  . CHOLELITHIASIS 04/22/2010  . Cholelithiasis 06/13/2010  . COLONIC POLYPS, HX OF 04/28/2007  . DEGENERATIVE JOINT DISEASE, RIGHT KNEE 10/24/2006  . Depression 02/23/2011  . DIABETES MELLITUS, TYPE II 04/28/2007  . Dizziness and giddiness 12/19/2009  . Elevated PSA 06/13/2010  . FATIGUE 04/28/2007  . GERD 02/11/2009  . Headache(784.0) 12/19/2009  . HYPERLIPIDEMIA 04/28/2007  . HYPERTENSION 10/21/2006  . NECK MASS 02/07/2010  . OBESITY 10/24/2006  . OTITIS MEDIA, ACUTE, LEFT 12/26/2009  . PHIMOSIS 02/07/2009  . S/P laparoscopic cholecystectomy 10/31/2010  . Thyroid cancer ([place]) 06/13/2010  . THYROID NODULE 02/07/2010   Past Surgical History:  Procedure Laterality Date  . CHOLECYSTECTOMY    . left knee surgery    . right wrist surgury    . THYROID SURGERY      reports that he has quit smoking. He has never used smokeless tobacco. He reports that he does not drink alcohol or use drugs. family history includes Arthritis in his mother; Cancer in his brother and sister; Colon cancer (age of onset: 38) in his brother; Diabetes in his brother; Goiter in his mother; Heart attack (age of onset: 63) in his brother; Heart attack (age of onset: 80) in  his father; Hypertension in his mother; Hypothyroidism in his sister. Allergies  Allergen Reactions  . Diphenoxylate-Atropine Other (See Comments)  . Ace Inhibitors     REACTION: cough  . Atenolol     REACTION: bradycardia  . Codeine Other (See Comments)    Head spins "wild"  . Levaquin [Levofloxacin]     Interferes with flecainide  . Lovastatin     REACTION: myalygros  . Morphine Nausea And Vomiting  . Morphine And Related Other (See Comments)  . Statins Other (See Comments)    Causes body to ache  . Sulfa Antibiotics Other (See Comments)    As child almost died  . Sulfamethoxazole Other (See Comments)    Childhood unknown reaction  . Sulfasalazine Other (See Comments)    As child almost died  . Tramadol     Ants crawling all over   Current Outpatient Medications on File Prior to Visit  Medication Sig Dispense Refill  . amLODipine (NORVASC) 5 MG tablet Take 2.5 mg by mouth 2 (two) times daily.     [name] apixaban (ELIQUIS) 5 MG TABS tablet Take by mouth 2 (two) times daily.    . Ascorbic Acid (VITAMIN C) 1000 MG tablet Take 1,000 mg by mouth daily.      . blood glucose meter kit and supplies KIT Dispense based on patient and insurance preference. Use up to four times daily as directed. (FOR ICD-9 250.00, 250.01). 1 each 0  . Calcium 500-125 MG-UNIT TABS 1 tablet daily.    [name]  calcium carbonate (OS-CAL) 600 MG TABS Take 600 mg by mouth daily.      . Cinnamon 500 MG capsule Take 500 mg by mouth 2 (two) times daily.      [name] diltiazem (CARTIA XT) 120 MG 24 hr capsule Take 1 capsule (120 mg total) by mouth 2 (two) times daily. 180 capsule 3  . doxycycline (VIBRA-TABS) 100 MG tablet Take 1 tablet (100 mg total) by mouth 2 (two) times daily. 20 tablet 0  . Flaxseed, Linseed, 1000 MG CAPS Take 1 capsule by mouth daily.    . flecainide (TAMBOCOR) 100 MG tablet Take 100 mg by mouth 2 (two) times daily.    . Garlic Oil (ODORLESS GARLIC) 532 MG TABS Take by mouth 2 (two) times daily.      .  Ginger, Zingiber officinalis, (GINGER PO) Take by mouth 4 (four) times daily.      . Glucosamine-Chondroit-Vit C-Mn (GLUCOSAMINE CHONDROITIN COMPLX) CAPS Take by mouth daily.      [name] glucose blood test strip Check glucose 2-3 times a day. E11.40 100 each 12  . levothyroxine (SYNTHROID, LEVOTHROID) 125 MCG tablet TAKE 1 TABLET ONE TIME DAILY BEFORE BREAKFAST 90 tablet 3  . losartan (COZAAR) 100 MG tablet Take 1 tablet (100 mg total) by mouth daily. 90 tablet 3  . metFORMIN (GLUCOPHAGE-XR) 500 MG 24 hr tablet TAKE 4 TABLETS EVERY DAY 360 tablet 3  . Misc Natural Products (OSTEO BI-FLEX TRIPLE STRENGTH) TABS Take 1 tablet by mouth.    . Multiple Vitamin (MULTIVITAMIN) capsule Take 1 capsule by mouth daily.      . Omega-3 Fatty Acids (FISH OIL) 1000 MG CAPS Take by mouth 2 (two) times daily.      [name] omeprazole (PRILOSEC) 20 MG capsule Take 1 capsule (20 mg total) by mouth 2 (two) times daily. 180 capsule 3  . promethazine-dextromethorphan (PROMETHAZINE-DM) 6.25-15 MG/5ML syrup Take 5 mLs by mouth 4 (four) times daily as needed for cough. 118 mL 0  . triamcinolone (KENALOG) 0.025 % cream Apply 1 application topically 2 (two) times daily.    . Triamcinolone Acetonide (TRIAMCINOLONE 0.1 % CREAM : EUCERIN) CREA Apply 1 application topically.    [name] VITAMIN D, CHOLECALCIFEROL, PO Take by mouth daily.      . potassium chloride (MICRO-K) 10 MEQ CR capsule Take 1 capsule (10 mEq total) by mouth daily for 5 days. 5 capsule 0   No current facility-administered medications on file prior to visit.    Review of Systems  Constitutional: Negative for other unusual diaphoresis or sweats HENT: Negative for ear discharge or swelling Eyes: Negative for other worsening visual disturbances Respiratory: Negative for stridor or other swelling  Gastrointestinal: Negative for worsening distension or other blood Genitourinary: Negative for retention or other urinary change Musculoskeletal: Negative for other MSK pain or  swelling Skin: Negative for color change or other new lesions Neurological: Negative for worsening tremors and other numbness  Psychiatric/Behavioral: Negative for worsening agitation or other fatigue All other system neg per pt    Objective:   Physical Exam BP 124/64   Pulse 60   Temp 98.5 F (36.9 C) (Oral)   Ht 6' (1.829 m)   Wt 221 lb (100.2 kg)   SpO2 96%   BMI 29.97 kg/m  VS noted, mild ill Constitutional: Pt appears in NAD HENT: Head: NCAT.  Right Ear: External ear normal.  Left Ear: External ear normal.  Eyes: . Pupils are equal, round, and reactive to light. Conjunctivae and EOM  are normal Nose: without d/c or deformity Neck: Neck supple. Gross normal ROM Cardiovascular: Normal rate and regular rhythm.   Pulmonary/Chest: Effort normal and breath sounds without rales or wheezing.  Neurological: Pt is alert. At baseline orientation, motor grossly intact Skin: Skin is warm. No rashes, other new lesions, no LE edema Psychiatric: Pt behavior is normal without agitation  No other exam findings  Lab Results  Component Value Date   WBC 7.3 10/05/2017   HGB 13.3 10/05/2017   HCT 38.5 (L) 10/05/2017   PLT 344.0 10/05/2017   GLUCOSE 154 (H) 12/18/2017   CHOL 193 05/08/2017   TRIG 245.0 (H) 05/08/2017   HDL 32.70 (L) 05/08/2017   LDLDIRECT 122.0 05/08/2017   LDLCALC 113 (H) 11/04/2016   ALT 17 09/21/2017   AST 16 09/21/2017   NA 140 12/18/2017   K 3.9 12/18/2017   CL 106 12/18/2017   CREATININE 1.27 12/18/2017   BUN 34 (H) 12/18/2017   CO2 24 12/18/2017   TSH 1.65 09/21/2017   PSA 3.59 05/08/2017   INR 1.0 08/22/2008   HGBA1C 5.8 (A) 11/10/2017   HGBA1C 0 11/10/2017   HGBA1C 0 (A) 11/10/2017   HGBA1C 0.0 11/10/2017   MICROALBUR 1.1 11/04/2016       Assessment & Plan:

## 2018-04-09 NOTE — Addendum Note (Signed)
Addended by: Biagio Borg on: 04/09/2018 10:01 AM   Modules accepted: Orders

## 2018-04-09 NOTE — Assessment & Plan Note (Signed)
With hx of pna,recent XRT, cant r/o recurrence, declines cxr after recent XRT, for doxycycline course, has cough med for prn use at home from last visit,  to f/u any worsening symptoms or concerns

## 2018-04-09 NOTE — Assessment & Plan Note (Signed)
stable overall by history and exam, recent data reviewed with pt, and pt to continue medical treatment as before,  to f/u any worsening symptoms or concerns  

## 2018-04-09 NOTE — Patient Instructions (Signed)
Please take all new medication as prescribed - the doxycycline  Please continue all other medications as before, and refills have been done if requested.  Please have the pharmacy call with any other refills you may need.  Please continue your efforts at being more active, low cholesterol diet, and weight control.  Please keep your appointments with your specialists as you may have planned

## 2018-04-14 ENCOUNTER — Ambulatory Visit: Payer: PRIVATE HEALTH INSURANCE | Admitting: Sports Medicine

## 2018-04-16 ENCOUNTER — Ambulatory Visit: Payer: Medicare Other | Admitting: Sports Medicine

## 2018-04-16 DIAGNOSIS — Z85850 Personal history of malignant neoplasm of thyroid: Secondary | ICD-10-CM | POA: Diagnosis not present

## 2018-04-16 DIAGNOSIS — E890 Postprocedural hypothyroidism: Secondary | ICD-10-CM | POA: Diagnosis not present

## 2018-04-16 DIAGNOSIS — C73 Malignant neoplasm of thyroid gland: Secondary | ICD-10-CM | POA: Diagnosis not present

## 2018-04-26 ENCOUNTER — Other Ambulatory Visit: Payer: Self-pay | Admitting: Internal Medicine

## 2018-04-26 MED ORDER — APIXABAN 5 MG PO TABS: 5.0000 mg | ORAL_TABLET | Freq: Two times a day (BID) | ORAL | 0 refills | Status: DC

## 2018-04-26 NOTE — Telephone Encounter (Signed)
Requested medication (s) are due for refill today -historical  Requested medication (s) are on the active medication list - yes  Future visit scheduled -yes  Last refill: listed as historical  Notes to clinic: Patient is requesting a medication listed as historical on medication list- sent for provider review   Requested Prescriptions  Pending Prescriptions Disp Refills   apixaban (ELIQUIS) 5 MG TABS tablet 60 tablet 0    Sig: Take 1 tablet (5 mg total) by mouth 2 (two) times daily.     Hematology:  Anticoagulants Failed - 04/26/2018  8:35 AM      Failed - HCT in normal range and within 360 days    HCT  Date Value Ref Range Status  10/05/2017 38.5 (L) 39.0 - 52.0 % Final         Passed - HGB in normal range and within 360 days    Hemoglobin  Date Value Ref Range Status  10/05/2017 13.3 13.0 - 17.0 g/dL Final         Passed - PLT in normal range and within 360 days    Platelets  Date Value Ref Range Status  10/05/2017 344.0 150.0 - 400.0 K/uL Final         Passed - Cr in normal range and within 360 days    Creatinine, Ser  Date Value Ref Range Status  12/18/2017 1.27 0.40 - 1.50 mg/dL Final         Passed - Valid encounter within last 12 months    Recent Outpatient Visits          2 weeks ago Cough   [place], James W, MD   2 months ago Acute upper respiratory infection   [place], James W, MD   4 months ago Essential hypertension   [place], Delphia Grates, NP   5 months ago Type 2 diabetes mellitus with diabetic neuropathy, without long-term current use of insulin [facility])   [facility] -Georges Mouse, MD   6 months ago AKI (acute kidney injury) [facility])   [facility] -Georges Mouse, MD      Future Appointments            In 2 weeks Biagio Borg, MD [place], [place]   In 5 months  [place], [facility]            Requested Prescriptions  Pending Prescriptions Disp Refills   apixaban (ELIQUIS) 5 MG TABS tablet 60 tablet 0    Sig: Take 1 tablet (5 mg total) by mouth 2 (two) times daily.     Hematology:  Anticoagulants Failed - 04/26/2018  8:35 AM      Failed - HCT in normal range and within 360 days    HCT  Date Value Ref Range Status  10/05/2017 38.5 (L) 39.0 - 52.0 % Final         Passed - HGB in normal range and within 360 days    Hemoglobin  Date Value Ref Range Status  10/05/2017 13.3 13.0 - 17.0 g/dL Final         Passed - PLT in normal range and within 360 days    Platelets  Date Value Ref Range Status  10/05/2017 344.0 150.0 - 400.0 K/uL Final         Passed - Cr in normal range  and within 360 days    Creatinine, Ser  Date Value Ref Range Status  12/18/2017 1.27 0.40 - 1.50 mg/dL Final         Passed - Valid encounter within last 12 months    Recent Outpatient Visits          2 weeks ago Cough   [place], James W, MD   2 months ago Acute upper respiratory infection   [place], James W, MD   4 months ago Essential hypertension   [place], Delphia Grates, NP   5 months ago Type 2 diabetes mellitus with diabetic neuropathy, without long-term current use of insulin [facility])   [facility] -Georges Mouse, MD   6 months ago AKI (acute kidney injury) [facility])   [facility] -Georges Mouse, MD      Future Appointments            In 2 weeks Jenny Reichmann, Hunt Oris, MD [place], [place]   In 5 months  [place], [facility]

## 2018-04-26 NOTE — Telephone Encounter (Signed)
Copied from [place] [id number]. Topic: Quick Communication - Rx Refill/Question >> Apr 26, 2018  8:25 AM Judyann Munson wrote: Medication: apixaban (ELIQUIS) 5 MG TABS tablet  Has the patient contacted their pharmacy? Yes   Preferred Pharmacy (with phone number or street name): [place], [place] [phone] (Phone) [phone] (Fax)    Agent: Please be advised that RX refills may take up to 3 business days. We ask that you follow-up with your pharmacy.

## 2018-05-13 ENCOUNTER — Other Ambulatory Visit (INDEPENDENT_AMBULATORY_CARE_PROVIDER_SITE_OTHER): Payer: Medicare Other

## 2018-05-13 ENCOUNTER — Encounter: Payer: Self-pay | Admitting: Internal Medicine

## 2018-05-13 ENCOUNTER — Ambulatory Visit (INDEPENDENT_AMBULATORY_CARE_PROVIDER_SITE_OTHER): Payer: Medicare Other | Admitting: Internal Medicine

## 2018-05-13 VITALS — BP 114/66 | HR 63 | Temp 97.6°F | Ht 72.0 in | Wt 223.0 lb

## 2018-05-13 DIAGNOSIS — E1140 Type 2 diabetes mellitus with diabetic neuropathy, unspecified: Secondary | ICD-10-CM

## 2018-05-13 DIAGNOSIS — E785 Hyperlipidemia, unspecified: Secondary | ICD-10-CM

## 2018-05-13 DIAGNOSIS — I10 Essential (primary) hypertension: Secondary | ICD-10-CM

## 2018-05-13 LAB — CBC WITH DIFFERENTIAL/PLATELET
BASOS ABS: 0 10*3/uL (ref 0.0–0.1)
Basophils Relative: 0.6 % (ref 0.0–3.0)
Eosinophils Absolute: 0.2 10*3/uL (ref 0.0–0.7)
Eosinophils Relative: 2.5 % (ref 0.0–5.0)
HCT: 42.3 % (ref 39.0–52.0)
Hemoglobin: 14.6 g/dL (ref 13.0–17.0)
Lymphocytes Relative: 19.2 % (ref 12.0–46.0)
Lymphs Abs: 1.3 10*3/uL (ref 0.7–4.0)
MCHC: 34.4 g/dL (ref 30.0–36.0)
MCV: 89.6 fl (ref 78.0–100.0)
Monocytes Absolute: 0.5 10*3/uL (ref 0.1–1.0)
Monocytes Relative: 7.8 % (ref 3.0–12.0)
NEUTROS ABS: 4.7 10*3/uL (ref 1.4–7.7)
Neutrophils Relative %: 69.9 % (ref 43.0–77.0)
Platelets: 281 10*3/uL (ref 150.0–400.0)
RBC: 4.72 Mil/uL (ref 4.22–5.81)
RDW: 14.4 % (ref 11.5–15.5)
WBC: 6.7 10*3/uL (ref 4.0–10.5)

## 2018-05-13 LAB — BASIC METABOLIC PANEL
BUN: 25 mg/dL — ABNORMAL HIGH (ref 6–23)
CO2: 27 mEq/L (ref 19–32)
Calcium: 9.5 mg/dL (ref 8.4–10.5)
Chloride: 105 mEq/L (ref 96–112)
Creatinine, Ser: 1.27 mg/dL (ref 0.40–1.50)
GFR: 54.59 mL/min — ABNORMAL LOW (ref >60.00)
Glucose, Bld: 157 mg/dL — ABNORMAL HIGH (ref 70–99)
Potassium: 4 mEq/L (ref 3.5–5.1)
Sodium: 139 mEq/L (ref 135–145)

## 2018-05-13 LAB — HEMOGLOBIN A1C: Hgb A1c MFr Bld: 6.1 % (ref 4.6–6.5)

## 2018-05-13 MED ORDER — ZOSTER VAC RECOMB ADJUVANTED 50 MCG/0.5ML IM SUSR: 0.5000 mL | Freq: Once | INTRAMUSCULAR | 1 refills | Status: AC

## 2018-05-13 NOTE — Assessment & Plan Note (Signed)
stable overall by history and exam, recent data reviewed with pt, and pt to continue medical treatment as before,  to f/u any worsening symptoms or concerns  

## 2018-05-13 NOTE — Progress Notes (Signed)
Subjective:    Patient ID: Nathan Russo, male    DOB: January 21, 1941, 78 y.o.   MRN: [id number]  HPI  Here to f/u; overall doing ok,  Pt denies chest pain, increasing sob or doe, wheezing, orthopnea, PND, increased LE swelling, palpitations, dizziness or syncope.  Pt denies new neurological symptoms such as new headache, or facial or extremity weakness or numbness.  Pt denies polydipsia, polyuria, or low sugar episode.  Pt states overall good compliance with meds, mostly trying to follow appropriate diet, with wt overall stable,  but little exercise however. No new complaints Past Medical History:  Diagnosis Date  . Abdominal pain, epigastric 04/11/2010  . BELCHING 04/23/2010  . BRADYCARDIA, CHRONIC 10/24/2006  . CARPAL TUNNEL SYNDROME, BILATERAL 02/07/2009  . CHEST PAIN-UNSPECIFIED 09/05/2008  . CHOLELITHIASIS 04/22/2010  . Cholelithiasis 06/13/2010  . COLONIC POLYPS, HX OF 04/28/2007  . DEGENERATIVE JOINT DISEASE, RIGHT KNEE 10/24/2006  . Depression 02/23/2011  . DIABETES MELLITUS, TYPE II 04/28/2007  . Dizziness and giddiness 12/19/2009  . Elevated PSA 06/13/2010  . FATIGUE 04/28/2007  . GERD 02/11/2009  . Headache(784.0) 12/19/2009  . HYPERLIPIDEMIA 04/28/2007  . HYPERTENSION 10/21/2006  . NECK MASS 02/07/2010  . OBESITY 10/24/2006  . OTITIS MEDIA, ACUTE, LEFT 12/26/2009  . PHIMOSIS 02/07/2009  . S/P laparoscopic cholecystectomy 10/31/2010  . Thyroid cancer ([place]) 06/13/2010  . THYROID NODULE 02/07/2010   Past Surgical History:  Procedure Laterality Date  . CHOLECYSTECTOMY    . left knee surgery    . right wrist surgury    . THYROID SURGERY      reports that he has quit smoking. He has never used smokeless tobacco. He reports that he does not drink alcohol or use drugs. family history includes Arthritis in his mother; Cancer in his brother and sister; Colon cancer (age of onset: 64) in his brother; Diabetes in his brother; Goiter in his mother; Heart attack (age of onset: 14) in his brother; Heart  attack (age of onset: 31) in his father; Hypertension in his mother; Hypothyroidism in his sister. Allergies  Allergen Reactions  . Diphenoxylate-Atropine Other (See Comments)  . Ace Inhibitors     REACTION: cough  . Atenolol     REACTION: bradycardia  . Codeine Other (See Comments)    Head spins "wild"  . Levaquin [Levofloxacin]     Interferes with flecainide  . Lovastatin     REACTION: myalygros  . Morphine Nausea And Vomiting  . Morphine And Related Other (See Comments)  . Statins Other (See Comments)    Causes body to ache  . Sulfa Antibiotics Other (See Comments)    As child almost died  . Sulfamethoxazole Other (See Comments)    Childhood unknown reaction  . Sulfasalazine Other (See Comments)    As child almost died  . Tramadol     Ants crawling all over   Current Outpatient Medications on File Prior to Visit  Medication Sig Dispense Refill  . amLODipine (NORVASC) 5 MG tablet Take 2.5 mg by mouth 2 (two) times daily.     [name] apixaban (ELIQUIS) 5 MG TABS tablet Take 1 tablet (5 mg total) by mouth 2 (two) times daily. 60 tablet 0  . Ascorbic Acid (VITAMIN C) 1000 MG tablet Take 1,000 mg by mouth daily.      . blood glucose meter kit and supplies KIT Dispense based on patient and insurance preference. Use up to four times daily as directed. (FOR ICD-9 250.00, 250.01). 1 each 0  .  Blood Glucose Monitoring Suppl Supplies MISC Use 1 strip as directed three times daily E11.9 300 each 3  . Calcium 500-125 MG-UNIT TABS 1 tablet daily.    . calcium carbonate (OS-CAL) 600 MG TABS Take 600 mg by mouth daily.      . Cinnamon 500 MG capsule Take 500 mg by mouth 2 (two) times daily.      [name] diltiazem (CARTIA XT) 120 MG 24 hr capsule Take 1 capsule (120 mg total) by mouth 2 (two) times daily. 180 capsule 3  . doxycycline (VIBRA-TABS) 100 MG tablet TAKE 1 TABLET BY MOUTH TWICE DAILY 20 tablet 0  . Flaxseed, Linseed, 1000 MG CAPS Take 1 capsule by mouth daily.    . flecainide (TAMBOCOR) 100  MG tablet Take 100 mg by mouth 2 (two) times daily.    . Garlic Oil (ODORLESS GARLIC) 945 MG TABS Take by mouth 2 (two) times daily.      . Ginger, Zingiber officinalis, (GINGER PO) Take by mouth 4 (four) times daily.      . Glucosamine-Chondroit-Vit C-Mn (GLUCOSAMINE CHONDROITIN COMPLX) CAPS Take by mouth daily.      [name] glucose blood test strip Check glucose 2-3 times a day. E11.40 100 each 12  . levothyroxine (SYNTHROID, LEVOTHROID) 125 MCG tablet TAKE 1 TABLET ONE TIME DAILY BEFORE BREAKFAST 90 tablet 3  . losartan (COZAAR) 100 MG tablet Take 1 tablet (100 mg total) by mouth daily. 90 tablet 3  . metFORMIN (GLUCOPHAGE-XR) 500 MG 24 hr tablet TAKE 4 TABLETS EVERY DAY 360 tablet 3  . Misc Natural Products (OSTEO BI-FLEX TRIPLE STRENGTH) TABS Take 1 tablet by mouth.    . Multiple Vitamin (MULTIVITAMIN) capsule Take 1 capsule by mouth daily.      . Omega-3 Fatty Acids (FISH OIL) 1000 MG CAPS Take by mouth 2 (two) times daily.      [name] omeprazole (PRILOSEC) 20 MG capsule Take 1 capsule (20 mg total) by mouth 2 (two) times daily. 180 capsule 3  . promethazine-dextromethorphan (PROMETHAZINE-DM) 6.25-15 MG/5ML syrup Take 5 mLs by mouth 4 (four) times daily as needed for cough. 118 mL 0  . triamcinolone (KENALOG) 0.025 % cream Apply 1 application topically 2 (two) times daily.    . Triamcinolone Acetonide (TRIAMCINOLONE 0.1 % CREAM : EUCERIN) CREA Apply 1 application topically.    [name] VITAMIN D, CHOLECALCIFEROL, PO Take by mouth daily.      . potassium chloride (MICRO-K) 10 MEQ CR capsule Take 1 capsule (10 mEq total) by mouth daily for 5 days. 5 capsule 0   No current facility-administered medications on file prior to visit.    Review of Systems  Constitutional: Negative for other unusual diaphoresis or sweats HENT: Negative for ear discharge or swelling Eyes: Negative for other worsening visual disturbances Respiratory: Negative for stridor or other swelling  Gastrointestinal: Negative for worsening  distension or other blood Genitourinary: Negative for retention or other urinary change Musculoskeletal: Negative for other MSK pain or swelling Skin: Negative for color change or other new lesions Neurological: Negative for worsening tremors and other numbness  Psychiatric/Behavioral: Negative for worsening agitation or other fatigue All other system neg per pt    Objective:   Physical Exam BP 114/66   Pulse 63   Temp 97.6 F (36.4 C) (Oral)   Ht 6' (1.829 m)   Wt 223 lb (101.2 kg)   SpO2 98%   BMI 30.24 kg/m  VS noted,  Constitutional: Pt appears in NAD HENT: Head: NCAT.  Right Ear: External ear normal.  Left Ear: External ear normal.  Eyes: . Pupils are equal, round, and reactive to light. Conjunctivae and EOM are normal Nose: without d/c or deformity Neck: Neck supple. Gross normal ROM Cardiovascular: Normal rate and regular rhythm.   Pulmonary/Chest: Effort normal and breath sounds without rales or wheezing.  Abd:  Soft, NT, ND, + BS, no organomegaly Neurological: Pt is alert. At baseline orientation, motor grossly intact Skin: Skin is warm. No rashes, other new lesions, no LE edema Psychiatric: Pt behavior is normal without agitation  No other exam findings Lab Results  Component Value Date   WBC 6.7 05/13/2018   HGB 14.6 05/13/2018   HCT 42.3 05/13/2018   PLT 281.0 05/13/2018   GLUCOSE 157 (H) 05/13/2018   CHOL 193 05/08/2017   TRIG 245.0 (H) 05/08/2017   HDL 32.70 (L) 05/08/2017   LDLDIRECT 122.0 05/08/2017   LDLCALC 113 (H) 11/04/2016   ALT 17 09/21/2017   AST 16 09/21/2017   NA 139 05/13/2018   K 4.0 05/13/2018   CL 105 05/13/2018   CREATININE 1.27 05/13/2018   BUN 25 (H) 05/13/2018   CO2 27 05/13/2018   TSH 1.65 09/21/2017   PSA 3.59 05/08/2017   INR 1.0 08/22/2008   HGBA1C 6.1 05/13/2018   MICROALBUR 1.1 11/04/2016       Assessment & Plan:

## 2018-05-13 NOTE — Patient Instructions (Addendum)
Your shingles shot prescription was sent to the pharmacy  Please continue all other medications as before, and refills have been done if requested.  Please have the pharmacy call with any other refills you may need.  Please continue your efforts at being more active, low cholesterol diet, and weight control.  You are otherwise up to date with prevention measures today.  Please keep your appointments with your specialists as you may have planned  Please go to the LAB in the Basement (turn left off the elevator) for the tests to be done today  You will be contacted by phone if any changes need to be made immediately.  Otherwise, you will receive a letter about your results with an explanation, but please check with MyChart first.  Please remember to sign up for MyChart if you have not done so, as this will be important to you in the future with finding out test results, communicating by private email, and scheduling acute appointments online when needed.  Please return in 6 months, or sooner if needed

## 2018-05-20 DIAGNOSIS — I480 Paroxysmal atrial fibrillation: Secondary | ICD-10-CM | POA: Diagnosis not present

## 2018-05-21 DIAGNOSIS — R9431 Abnormal electrocardiogram [ECG] [EKG]: Secondary | ICD-10-CM | POA: Diagnosis not present

## 2018-05-21 DIAGNOSIS — I440 Atrioventricular block, first degree: Secondary | ICD-10-CM | POA: Diagnosis not present

## 2018-06-01 ENCOUNTER — Other Ambulatory Visit: Payer: Self-pay | Admitting: Internal Medicine

## 2018-06-01 MED ORDER — LOSARTAN POTASSIUM 100 MG PO TABS: 100.0000 mg | ORAL_TABLET | Freq: Every day | ORAL | 1 refills | Status: DC

## 2018-06-12 DIAGNOSIS — I4891 Unspecified atrial fibrillation: Secondary | ICD-10-CM | POA: Diagnosis not present

## 2018-06-23 ENCOUNTER — Ambulatory Visit: Payer: PRIVATE HEALTH INSURANCE | Admitting: Sports Medicine

## 2018-06-25 ENCOUNTER — Ambulatory Visit: Payer: PRIVATE HEALTH INSURANCE | Admitting: Sports Medicine

## 2018-06-26 DIAGNOSIS — I4891 Unspecified atrial fibrillation: Secondary | ICD-10-CM | POA: Diagnosis not present

## 2018-07-01 DIAGNOSIS — I4891 Unspecified atrial fibrillation: Secondary | ICD-10-CM | POA: Diagnosis not present

## 2018-07-01 DIAGNOSIS — I4892 Unspecified atrial flutter: Secondary | ICD-10-CM | POA: Diagnosis not present

## 2018-09-01 ENCOUNTER — Ambulatory Visit: Payer: PRIVATE HEALTH INSURANCE | Admitting: Sports Medicine

## 2018-09-03 ENCOUNTER — Ambulatory Visit: Payer: PRIVATE HEALTH INSURANCE | Admitting: Sports Medicine

## 2018-10-04 ENCOUNTER — Telehealth: Payer: Self-pay | Admitting: Internal Medicine

## 2018-10-04 MED ORDER — GLUCOSE BLOOD VI STRP: ORAL_STRIP | 12 refills | Status: DC

## 2018-10-04 NOTE — Telephone Encounter (Signed)
Disp Refills Start End   Blood Glucose Monitoring Suppl Supplies MISC 300 each 3 04/09/2018    Sig: Use 1 strip as directed three times daily E11.9   Sent to pharmacy as: Blood Glucose Monitoring Suppl Supplies Misc   Notes to Pharmacy: Pt requests strips fr [place], [place] [phone] (Phone) [phone] (Fax)   pls submit these as almost out.   This is the kind that [facility] says that medicare will pay for

## 2018-10-04 NOTE — Telephone Encounter (Signed)
Routed to wrong practice. Sending to [facility].

## 2018-10-09 DIAGNOSIS — R509 Fever, unspecified: Secondary | ICD-10-CM | POA: Diagnosis not present

## 2018-10-09 DIAGNOSIS — R0602 Shortness of breath: Secondary | ICD-10-CM | POA: Diagnosis not present

## 2018-10-09 DIAGNOSIS — R05 Cough: Secondary | ICD-10-CM | POA: Diagnosis not present

## 2018-10-09 DIAGNOSIS — R51 Headache: Secondary | ICD-10-CM | POA: Diagnosis not present

## 2018-10-19 ENCOUNTER — Ambulatory Visit (INDEPENDENT_AMBULATORY_CARE_PROVIDER_SITE_OTHER): Payer: Medicare Other | Admitting: *Deleted

## 2018-10-19 DIAGNOSIS — Z0000 Encounter for general adult medical examination without abnormal findings: Secondary | ICD-10-CM | POA: Diagnosis not present

## 2018-10-19 NOTE — Progress Notes (Addendum)
Subjective:   Nathan Russo is a 78 y.o. male who presents for Medicare Annual/Subsequent preventive examination.  I connected with patient 10/19/18 at  1:00 PM EDT by a video/audio enabled telemedicine application and verified that I am speaking with the correct person using two identifiers. Patient stated full name and DOB. Patient gave permission to continue with virtual visit. Patient's location was at home and Nurse's location was at [place] office.   Review of Systems:   Cardiac Risk Factors include: advanced age (>3mn, >>58women);diabetes mellitus;dyslipidemia;male gender Sleep patterns: feels rested on waking, gets up 0-1 times nightly to void and sleeps 7-8 hours nightly.    Home Safety/Smoke Alarms: Feels safe in home. Smoke alarms in place.  Living environment; residence and Firearm Safety: [place] can live on one level. Lives with wife, no needs for DME, good support system Seat Belt Safety/Bike Helmet: Wears seat belt.     Objective:    Vitals: There were no vitals taken for this visit.  There is no height or weight on file to calculate BMI.  Advanced Directives 10/19/2018 10/09/2017 05/21/2016 05/10/2015  Does Patient Have a Medical Advance Directive? Yes Yes Yes Yes  Type of AParamedicof A[place]isleLiving will [place] ParkLiving will H[place]Living will -  Copy of [place] Chart? No - copy requested No - copy requested No - copy requested No - copy requested    Tobacco Social History   Tobacco Use  Smoking Status Former Smoker  Smokeless Tobacco Never Used     Counseling given: Not Answered  Past Medical History:  Diagnosis Date  . Abdominal pain, epigastric 04/11/2010  . BELCHING 04/23/2010  . BRADYCARDIA, CHRONIC 10/24/2006  . CARPAL TUNNEL SYNDROME, BILATERAL 02/07/2009  . CHEST PAIN-UNSPECIFIED 09/05/2008  . CHOLELITHIASIS 04/22/2010  . Cholelithiasis 06/13/2010  . COLONIC  POLYPS, HX OF 04/28/2007  . DEGENERATIVE JOINT DISEASE, RIGHT KNEE 10/24/2006  . Depression 02/23/2011  . DIABETES MELLITUS, TYPE II 04/28/2007  . Dizziness and giddiness 12/19/2009  . Elevated PSA 06/13/2010  . FATIGUE 04/28/2007  . GERD 02/11/2009  . Headache(784.0) 12/19/2009  . HYPERLIPIDEMIA 04/28/2007  . HYPERTENSION 10/21/2006  . NECK MASS 02/07/2010  . OBESITY 10/24/2006  . OTITIS MEDIA, ACUTE, LEFT 12/26/2009  . PHIMOSIS 02/07/2009  . S/P laparoscopic cholecystectomy 10/31/2010  . Thyroid cancer ([place] 06/13/2010  . THYROID NODULE 02/07/2010   Past Surgical History:  Procedure Laterality Date  . CHOLECYSTECTOMY    . left knee surgery    . right wrist surgury    . THYROID SURGERY     Family History  Problem Relation Age of Onset  . Heart attack Brother 752 . Diabetes Brother   . Cancer Brother        colon  . Colon cancer Brother 6108 . Hypertension Mother   . Arthritis Mother   . Goiter Mother   . Heart attack Father 820 . Cancer Sister        breast  . Hypothyroidism Sister    Social History   Socioeconomic History  . Marital status: Married    Spouse name: Not on file  . Number of children: 3  . Years of education: Not on file  . Highest education level: Not on file  Occupational History  . Occupation: former MProgrammer, systems RETIRED  Social Needs  . Financial resource strain: Not hard at all  . Food  insecurity    Worry: Never true    Inability: Never true  . Transportation needs    Medical: No    Non-medical: No  Tobacco Use  . Smoking status: Former Research scientist (life sciences)  . Smokeless tobacco: Never Used  Substance and Sexual Activity  . Alcohol use: No    Alcohol/week: 0.0 standard drinks  . Drug use: No  . Sexual activity: Not Currently  Lifestyle  . Physical activity    Days per week: 3 days    Minutes per session: 40 min  . Stress: Not at all  Relationships  . Social connections    Talks on phone: More than three times a  week    Gets together: More than three times a week    Attends religious service: More than 4 times per year    Active member of club or organization: Yes    Attends meetings of clubs or organizations: More than 4 times per year    Relationship status: Married  Other Topics Concern  . Not on file  Social History Narrative  . Not on file    Outpatient Encounter Medications as of 10/19/2018  Medication Sig  . amLODipine (NORVASC) 5 MG tablet Take 2.5 mg by mouth 2 (two) times daily.   [name] apixaban (ELIQUIS) 5 MG TABS tablet Take 1 tablet (5 mg total) by mouth 2 (two) times daily.  . Ascorbic Acid (VITAMIN C) 1000 MG tablet Take 1,000 mg by mouth daily.    . blood glucose meter kit and supplies KIT Dispense based on patient and insurance preference. Use up to four times daily as directed. (FOR ICD-9 250.00, 250.01).  . Blood Glucose Monitoring Suppl Supplies MISC Use 1 strip as directed three times daily E11.9  . Calcium 500-125 MG-UNIT TABS 1 tablet daily.  . calcium carbonate (OS-CAL) 600 MG TABS Take 600 mg by mouth daily.    . Cinnamon 500 MG capsule Take 500 mg by mouth 2 (two) times daily.    [name] diltiazem (CARTIA XT) 120 MG 24 hr capsule Take 1 capsule (120 mg total) by mouth 2 (two) times daily.  . Flaxseed, Linseed, 1000 MG CAPS Take 1 capsule by mouth daily.  . flecainide (TAMBOCOR) 100 MG tablet Take 100 mg by mouth 2 (two) times daily.  . Garlic Oil (ODORLESS GARLIC) 794 MG TABS Take by mouth 2 (two) times daily.    . Ginger, Zingiber officinalis, (GINGER PO) Take by mouth 4 (four) times daily.    . Glucosamine-Chondroit-Vit C-Mn (GLUCOSAMINE CHONDROITIN COMPLX) CAPS Take by mouth daily.    [name] glucose blood test strip Check glucose 2-3 times a day. E11.40  . levothyroxine (SYNTHROID, LEVOTHROID) 125 MCG tablet TAKE 1 TABLET ONE TIME DAILY BEFORE BREAKFAST  . losartan (COZAAR) 100 MG tablet Take 1 tablet (100 mg total) by mouth daily.  . metFORMIN (GLUCOPHAGE-XR) 500 MG 24 hr tablet  TAKE 4 TABLETS EVERY DAY  . Misc Natural Products (OSTEO BI-FLEX TRIPLE STRENGTH) TABS Take 1 tablet by mouth.  . Multiple Vitamin (MULTIVITAMIN) capsule Take 1 capsule by mouth daily.    . Omega-3 Fatty Acids (FISH OIL) 1000 MG CAPS Take by mouth 2 (two) times daily.    [name] omeprazole (PRILOSEC) 20 MG capsule Take 1 capsule (20 mg total) by mouth 2 (two) times daily.  . promethazine-dextromethorphan (PROMETHAZINE-DM) 6.25-15 MG/5ML syrup Take 5 mLs by mouth 4 (four) times daily as needed for cough.  . triamcinolone (KENALOG) 0.025 % cream Apply 1 application topically 2 (  two) times daily.  . Triamcinolone Acetonide (TRIAMCINOLONE 0.1 % CREAM : EUCERIN) CREA Apply 1 application topically.  [name] VITAMIN D, CHOLECALCIFEROL, PO Take by mouth daily.    . [DISCONTINUED] doxycycline (VIBRA-TABS) 100 MG tablet TAKE 1 TABLET BY MOUTH TWICE DAILY (Patient not taking: Reported on 10/19/2018)  . [DISCONTINUED] potassium chloride (MICRO-K) 10 MEQ CR capsule Take 1 capsule (10 mEq total) by mouth daily for 5 days.   No facility-administered encounter medications on file as of 10/19/2018.     Activities of Daily Living In your present state of health, do you have any difficulty performing the following activities: 10/19/2018  Hearing? N  Vision? N  Difficulty concentrating or making decisions? N  Walking or climbing stairs? N  Dressing or bathing? N  Doing errands, shopping? N  Preparing Food and eating ? N  Using the Toilet? N  In the past six months, have you accidently leaked urine? N  Do you have problems with loss of bowel control? N  Managing your Medications? N  Managing your Finances? N  Housekeeping or managing your Housekeeping? N  Some recent data might be hidden    Patient Care Team: Biagio Borg, MD as PCP - General Philomena Doheny, MD as Referring Physician (Plastic Surgery) Ebbie Ridge, MD as Referring Physician (Cardiology)   Assessment:   This is a routine wellness  examination for [place]. Physical assessment deferred to PCP.  Exercise Activities and Dietary recommendations Current Exercise Habits: Home exercise routine, Type of exercise: walking(stationary bike), Time (Minutes): 50, Frequency (Times/Week): 4, Weekly Exercise (Minutes/Week): 200, Intensity: Mild, Exercise limited by: orthopedic condition(s)  Diet (meal preparation, eat out, water intake, caffeinated beverages, dairy products, fruits and vegetables): in general, a "healthy" diet  , well balanced. eats a variety of fruits and vegetables daily, limits salt, fat/cholesterol, sugar,carbohydrates,caffeine, drinks 6-8 glasses of water daily.  Goals      Patient Stated   . patient (pt-stated)     Wants to have left knee replacement 1981; goes back April 20th Trying to use bone on bone;         Other   . Control my diabetic condition     Focus on eating low sugar and low carbohydrates, start to exercise    . Patient Stated     Stay as healthy and as independent as possible. Continue to eat healthy, exercise, enjoy life and family.       Fall Risk Fall Risk  10/19/2018 05/13/2018 10/09/2017 05/08/2017 11/04/2016  Falls in the past year? 0 0 No Yes Yes  Number falls in past yr: 0 - - 2 or more 2 or more  Comment - - - - -  Injury with Fall? - - - (No Data) -  Comment - - - better now after PT with bike -    Depression Screen PHQ 2/9 Scores 10/19/2018 05/13/2018 10/09/2017 05/08/2017  PHQ - 2 Score 0 0 0 0    Cognitive Function       Ad8 score reviewed for issues:  Issues making decisions: no  Less interest in hobbies / activities: no  Repeats questions, stories (family complaining): no  Trouble using ordinary gadgets (microwave, computer, phone):no  Forgets the month or year: no  Mismanaging finances: no  Remembering appts: no  Daily problems with thinking and/or memory: no Ad8 score is= 0  Immunization History  Administered Date(s) Administered  . Influenza Split  12/13/2010  . Influenza Whole 12/09/2007, 12/13/2009  . Influenza,  High Dose Seasonal PF 12/10/2014, 11/04/2016, 11/10/2017  . Influenza, Seasonal, Injecte, Preservative Fre 12/08/2012  . Influenza,inj,Quad PF,6+ Mos 11/07/2015  . Pneumococcal Conjugate-13 03/22/2013  . Pneumococcal Polysaccharide-23 01/08/2006  . Td 05/17/2009   Screening Tests Health Maintenance  Topic Date Due  . OPHTHALMOLOGY EXAM  05/26/2018  . INFLUENZA VACCINE  10/09/2018  . FOOT EXAM  05/13/2019  . TETANUS/TDAP  05/18/2019  . PNA vac Low Risk Adult  Completed      Plan:    Reviewed health maintenance screenings with patient today and relevant education, vaccines, and/or referrals were provided.   Continue to eat heart healthy diet (full of fruits, vegetables, whole grains, lean protein, water--limit salt, fat, and sugar intake) and increase physical activity as tolerated.  Continue doing brain stimulating activities (puzzles, reading, adult coloring books, staying active) to keep memory sharp.   I have personally reviewed and noted the following in the patient's chart:   . Medical and social history . Use of alcohol, tobacco or illicit drugs  . Current medications and supplements . Functional ability and status . Nutritional status . Physical activity . Advanced directives . List of other physicians . Screenings to include cognitive, depression, and falls . Referrals and appointments  In addition, I have reviewed and discussed with patient certain preventive protocols, quality metrics, and best practice recommendations. A written personalized care plan for preventive services as well as general preventive health recommendations were provided to patient.     Michiel Cowboy, RN  10/19/2018  Medical screening examination/treatment/procedure(s) were performed by non-physician practitioner and as supervising physician I was immediately available for consultation/collaboration. I agree with above. Cathlean Cower, MD

## 2018-10-20 DIAGNOSIS — Z923 Personal history of irradiation: Secondary | ICD-10-CM | POA: Diagnosis not present

## 2018-10-20 DIAGNOSIS — C73 Malignant neoplasm of thyroid gland: Secondary | ICD-10-CM | POA: Diagnosis not present

## 2018-10-20 DIAGNOSIS — L309 Dermatitis, unspecified: Secondary | ICD-10-CM | POA: Diagnosis not present

## 2018-10-20 DIAGNOSIS — H60311 Diffuse otitis externa, right ear: Secondary | ICD-10-CM | POA: Diagnosis not present

## 2018-11-24 DIAGNOSIS — C7800 Secondary malignant neoplasm of unspecified lung: Secondary | ICD-10-CM | POA: Diagnosis not present

## 2018-11-24 DIAGNOSIS — C73 Malignant neoplasm of thyroid gland: Secondary | ICD-10-CM | POA: Diagnosis not present

## 2018-11-24 DIAGNOSIS — R918 Other nonspecific abnormal finding of lung field: Secondary | ICD-10-CM | POA: Diagnosis not present

## 2018-11-24 DIAGNOSIS — H60543 Acute eczematoid otitis externa, bilateral: Secondary | ICD-10-CM | POA: Diagnosis not present

## 2018-11-24 DIAGNOSIS — L309 Dermatitis, unspecified: Secondary | ICD-10-CM | POA: Diagnosis not present

## 2018-12-03 DIAGNOSIS — E118 Type 2 diabetes mellitus with unspecified complications: Secondary | ICD-10-CM | POA: Diagnosis not present

## 2018-12-03 DIAGNOSIS — Z7901 Long term (current) use of anticoagulants: Secondary | ICD-10-CM | POA: Diagnosis not present

## 2018-12-03 DIAGNOSIS — E785 Hyperlipidemia, unspecified: Secondary | ICD-10-CM | POA: Diagnosis not present

## 2018-12-03 DIAGNOSIS — I480 Paroxysmal atrial fibrillation: Secondary | ICD-10-CM | POA: Diagnosis not present

## 2018-12-03 DIAGNOSIS — E119 Type 2 diabetes mellitus without complications: Secondary | ICD-10-CM | POA: Diagnosis not present

## 2018-12-03 DIAGNOSIS — I10 Essential (primary) hypertension: Secondary | ICD-10-CM | POA: Diagnosis not present

## 2018-12-03 DIAGNOSIS — C73 Malignant neoplasm of thyroid gland: Secondary | ICD-10-CM | POA: Diagnosis not present

## 2018-12-13 ENCOUNTER — Other Ambulatory Visit: Payer: Self-pay

## 2018-12-13 ENCOUNTER — Ambulatory Visit (INDEPENDENT_AMBULATORY_CARE_PROVIDER_SITE_OTHER): Payer: Medicare Other

## 2018-12-13 DIAGNOSIS — Z23 Encounter for immunization: Secondary | ICD-10-CM | POA: Diagnosis not present

## 2018-12-27 ENCOUNTER — Other Ambulatory Visit: Payer: Self-pay | Admitting: Internal Medicine

## 2018-12-30 DIAGNOSIS — D224 Melanocytic nevi of scalp and neck: Secondary | ICD-10-CM | POA: Diagnosis not present

## 2018-12-30 DIAGNOSIS — B359 Dermatophytosis, unspecified: Secondary | ICD-10-CM | POA: Diagnosis not present

## 2018-12-30 DIAGNOSIS — L570 Actinic keratosis: Secondary | ICD-10-CM | POA: Diagnosis not present

## 2018-12-30 DIAGNOSIS — Z85828 Personal history of other malignant neoplasm of skin: Secondary | ICD-10-CM | POA: Diagnosis not present

## 2018-12-30 DIAGNOSIS — Z23 Encounter for immunization: Secondary | ICD-10-CM | POA: Diagnosis not present

## 2018-12-30 DIAGNOSIS — L821 Other seborrheic keratosis: Secondary | ICD-10-CM | POA: Diagnosis not present

## 2019-01-03 DIAGNOSIS — Z882 Allergy status to sulfonamides status: Secondary | ICD-10-CM | POA: Diagnosis not present

## 2019-01-03 DIAGNOSIS — I10 Essential (primary) hypertension: Secondary | ICD-10-CM | POA: Diagnosis not present

## 2019-01-03 DIAGNOSIS — E119 Type 2 diabetes mellitus without complications: Secondary | ICD-10-CM | POA: Diagnosis not present

## 2019-01-03 DIAGNOSIS — C73 Malignant neoplasm of thyroid gland: Secondary | ICD-10-CM | POA: Diagnosis not present

## 2019-01-03 DIAGNOSIS — Z7984 Long term (current) use of oral hypoglycemic drugs: Secondary | ICD-10-CM | POA: Diagnosis not present

## 2019-01-03 DIAGNOSIS — Z7901 Long term (current) use of anticoagulants: Secondary | ICD-10-CM | POA: Diagnosis not present

## 2019-01-03 DIAGNOSIS — Z9889 Other specified postprocedural states: Secondary | ICD-10-CM | POA: Diagnosis not present

## 2019-01-03 DIAGNOSIS — I480 Paroxysmal atrial fibrillation: Secondary | ICD-10-CM | POA: Diagnosis not present

## 2019-01-03 DIAGNOSIS — Z888 Allergy status to other drugs, medicaments and biological substances status: Secondary | ICD-10-CM | POA: Diagnosis not present

## 2019-01-03 DIAGNOSIS — Z881 Allergy status to other antibiotic agents status: Secondary | ICD-10-CM | POA: Diagnosis not present

## 2019-01-03 DIAGNOSIS — Z885 Allergy status to narcotic agent status: Secondary | ICD-10-CM | POA: Diagnosis not present

## 2019-01-03 DIAGNOSIS — Z79899 Other long term (current) drug therapy: Secondary | ICD-10-CM | POA: Diagnosis not present

## 2019-01-03 DIAGNOSIS — R002 Palpitations: Secondary | ICD-10-CM | POA: Diagnosis not present

## 2019-01-10 ENCOUNTER — Other Ambulatory Visit: Payer: Self-pay | Admitting: Internal Medicine

## 2019-01-31 ENCOUNTER — Telehealth: Payer: Self-pay | Admitting: Internal Medicine

## 2019-01-31 NOTE — Telephone Encounter (Signed)
Copied from [place] [phone]. Topic: Quick Communication - See Telephone Encounter >> Jan 31, 2019  8:23 AM Loma [place] wrote: CRM for notification. See Telephone encounter for: 01/31/19. Pt is wanting to talk to [facility] for a quick moment about his ELIQUIS 5 MG TABS tablet, he is in a donut hole and needs to have a real quick discussion, just a min, ever so grateful336 [phone]. Pls FU

## 2019-02-16 ENCOUNTER — Ambulatory Visit: Payer: Self-pay | Admitting: *Deleted

## 2019-02-16 DIAGNOSIS — R1033 Periumbilical pain: Secondary | ICD-10-CM | POA: Diagnosis not present

## 2019-02-16 DIAGNOSIS — M25552 Pain in left hip: Secondary | ICD-10-CM | POA: Diagnosis not present

## 2019-02-16 DIAGNOSIS — M25551 Pain in right hip: Secondary | ICD-10-CM | POA: Diagnosis not present

## 2019-02-16 NOTE — Telephone Encounter (Signed)
Patient was helping his wife up from a fall and fell hit back of head on the wall and rt buttock on the floor. Denies cut/laceration to the back of head, no knot or sunken area. Denies pain/headache/visual changes/vomiting/LOC.Occured this morning. Patient is alert and oriented. Reviewed sxs with patient and his wife to look for over the next 24 hours. Also, landed on his right buttock on the floor, area sore with walking. Can move and stretch his leg without difficulty. Care Advice including ice pack to the buttock several times today. Tylenol for discomfort. Offered OV, declined and reported he would call orthopaedist for buttock/groin pain if needed. Aware of possible concussion signs to look for and call back if presented.   Reason for Disposition . Scalp swelling, bruise or pain  Answer Assessment - Initial Assessment Questions 1. MECHANISM: "How did the injury happen?" For falls, ask: "What height did you fall from?" and "What surface did you fall against?"      [address] while helping his wife up from a fall. 2. ONSET: "When did the injury happen?" (Minutes or hours ago)      This morning 3. NEUROLOGIC SYMPTOMS: "Was there any loss of consciousness?" "Are there any other neurological symptoms?"      No. Denies all. 4. MENTAL STATUS: "Does the person know who he is, who you are, and where he is?"      Yes, alert and oriented.  5. LOCATION: "What part of the head was hit?"      Top back of head 6. SCALP APPEARANCE: "What does the scalp look like? Is it bleeding now?" If so, ask: "Is it difficult to stop?"      No bump/cut 7. SIZE: For cuts, bruises, or swelling, ask: "How large is it?" (e.g., inches or centimeters)      *na8. PAIN: "Is there any pain?" If so, ask: "How bad is it?"  (e.g., Scale 1-10; or mild, moderate, severe)     none 9. TETANUS: For any breaks in the skin, ask: "When was the last tetanus booster?"     na 10. OTHER SYMPTOMS: "Do you have any other symptoms?" (e.g., neck pain,  vomiting)       none 11. PREGNANCY: "Is there any chance you are pregnant?" "When was your last menstrual period?"       Na  Protocols used: HEAD INJURY-A-AH

## 2019-02-19 DIAGNOSIS — M25551 Pain in right hip: Secondary | ICD-10-CM | POA: Diagnosis not present

## 2019-02-19 DIAGNOSIS — R1033 Periumbilical pain: Secondary | ICD-10-CM | POA: Diagnosis not present

## 2019-02-19 DIAGNOSIS — M25552 Pain in left hip: Secondary | ICD-10-CM | POA: Diagnosis not present

## 2019-03-11 ENCOUNTER — Other Ambulatory Visit: Payer: Self-pay | Admitting: Internal Medicine

## 2019-03-11 NOTE — Telephone Encounter (Signed)
Please refill as per office routine med refill policy (all routine meds refilled for 3 mo or monthly per pt preference up to one year from last visit, then month to month grace period for 3 mo, then further med refills will have to be denied)  

## 2019-03-14 ENCOUNTER — Other Ambulatory Visit: Payer: Self-pay | Admitting: Internal Medicine

## 2019-03-14 MED ORDER — METFORMIN HCL ER 500 MG PO TB24: ORAL_TABLET | ORAL | 0 refills | Status: DC

## 2019-03-14 MED ORDER — LOSARTAN POTASSIUM 100 MG PO TABS: 100.0000 mg | ORAL_TABLET | Freq: Every day | ORAL | 0 refills | Status: DC

## 2019-03-14 MED ORDER — OMEPRAZOLE 20 MG PO CPDR: 20.0000 mg | DELAYED_RELEASE_CAPSULE | Freq: Two times a day (BID) | ORAL | 3 refills | Status: DC

## 2019-03-14 NOTE — Telephone Encounter (Signed)
Medication Refill - Medication: omeprazole (PRILOSEC) 20 MG capsule/losartan (COZAAR) 100 MG tablet /metFORMIN (GLUCOPHAGE-XR) 500 MG 24 hr tablet  90 day supplies  Has the patient contacted their pharmacy? No. (Agent: If no, request that the patient contact the pharmacy for the refill.) (Agent: If yes, when and what did the pharmacy advise?) [place]  Preferred Pharmacy (with phone number or street name): [place], [place] Phone:  [phone]  Fax:  [phone]       Agent: Please be advised that RX refills may take up to 3 business days. We ask that you follow-up with your pharmacy.

## 2019-03-16 ENCOUNTER — Telehealth: Payer: Self-pay

## 2019-03-16 MED ORDER — LEVOTHYROXINE SODIUM 125 MCG PO TABS: ORAL_TABLET | ORAL | 0 refills | Status: DC

## 2019-03-16 MED ORDER — METFORMIN HCL ER 500 MG PO TB24: ORAL_TABLET | ORAL | 0 refills | Status: DC

## 2019-03-16 MED ORDER — OMEPRAZOLE 20 MG PO CPDR: 20.0000 mg | DELAYED_RELEASE_CAPSULE | Freq: Two times a day (BID) | ORAL | 0 refills | Status: DC

## 2019-03-16 MED ORDER — LOSARTAN POTASSIUM 100 MG PO TABS: 100.0000 mg | ORAL_TABLET | Freq: Every day | ORAL | 0 refills | Status: DC

## 2019-03-16 NOTE — Telephone Encounter (Signed)
Copied from [place] [phone]. Topic: General - Inquiry >> Mar 16, 2019  9:55 AM Nathan Russo wrote: Reason for CRM: pt called in and stated just fyi, all scripts need to got [facility] mail order pharmacy  Scripts [phone] or [phone]

## 2019-03-16 NOTE — Telephone Encounter (Signed)
Updated pharmacy sent all maintenance meds that's rx by [name] to [facility].Marland KitchenJohny Chess

## 2019-03-24 ENCOUNTER — Telehealth: Payer: Self-pay

## 2019-03-24 ENCOUNTER — Telehealth: Payer: Self-pay | Admitting: *Deleted

## 2019-03-24 NOTE — Telephone Encounter (Signed)
Patient is scheduled to have his COVID19 vaccine next week through [facility] @ the [employer].  He wants to know:   1. Do they have proper emergency medical equipment and medications in case of a medical emergency at the drive through vaccine clinics? 2. Does [name] think patient needs to have a epi pen in case of anaphylaxis?  Please advise

## 2019-03-24 NOTE — Telephone Encounter (Signed)
Yes, they will have emergency equipment, so epipen would not be needed,  thanks

## 2019-03-25 NOTE — Telephone Encounter (Signed)
Pt informed of below.  

## 2019-03-31 ENCOUNTER — Other Ambulatory Visit: Payer: Self-pay | Admitting: Internal Medicine

## 2019-03-31 NOTE — Telephone Encounter (Signed)
     [facility] calling states they have not received order for diabetic supplies  1. Which medications need to be refilled? (please list name of each medication and dose if known) All diabetic supplies  2. Which pharmacy/location (including street and city if local pharmacy) is medication to be sent to? [facility]  3. Do they need a 30 day or 90 day supply? [place]

## 2019-04-01 MED ORDER — METFORMIN HCL ER 500 MG PO TB24: ORAL_TABLET | ORAL | 0 refills | Status: DC

## 2019-04-01 MED ORDER — LEVOTHYROXINE SODIUM 125 MCG PO TABS: ORAL_TABLET | ORAL | 0 refills | Status: DC

## 2019-04-01 NOTE — Telephone Encounter (Signed)
   Patient does not want meds and supplies sent to [facility]    1. Which medications need to be refilled? (please list name of each medication and dose if known)  levothyroxine (SYNTHROID) 125 MCG tablet metFORMIN (GLUCOPHAGE-XR) 500 MG 24 hr tablet Blood Glucose Monitoring Suppl Supplies MISC  2. Which pharmacy/location (including street and city if local pharmacy) is medication to be sent to?  3. Do they need a 30 day or 90 day supply? [place]

## 2019-04-03 ENCOUNTER — Ambulatory Visit: Payer: Medicare PPO | Attending: Internal Medicine

## 2019-04-03 DIAGNOSIS — Z23 Encounter for immunization: Secondary | ICD-10-CM | POA: Insufficient documentation

## 2019-04-03 NOTE — Progress Notes (Signed)
   Covid-19 Vaccination Clinic  Name:  Nathan Russo    MRN: [id number] DOB: 1940/04/21  04/03/2019  Nathan Russo was observed post Covid-19 immunization for 15 minutes without incidence. He was provided with Vaccine Information Sheet and instruction to access the V-Safe system.   Nathan Russo was instructed to call 911 with any severe reactions post vaccine: [name] Difficulty breathing  . Swelling of your face and throat  . A fast heartbeat  . A bad rash all over your body  . Dizziness and weakness    Immunizations Administered    Name Date Dose VIS Date Route   Pfizer COVID-19 Vaccine 04/03/2019 11:55 AM 0.3 mL 02/18/2019 Intramuscular   Manufacturer: [place]   Lot: [id number]   [place]: [id number]

## 2019-04-06 ENCOUNTER — Telehealth: Payer: Self-pay | Admitting: Internal Medicine

## 2019-04-06 NOTE — Telephone Encounter (Signed)
    Patient calling to report on 1/24 after taking first dose of Covid vaccine he experienced tingling in lips and neck was slightly swollen. Patient also reported he had some chest discomfort Symptoms went away today.   Patient requesting call from Dr John/ CMA

## 2019-04-06 NOTE — Telephone Encounter (Signed)
Called patient and informed of message.   Voiced understanding no further questions

## 2019-04-06 NOTE — Telephone Encounter (Signed)
Ok to get second vaccine shot with benadryl 50 mg and tylenol 650 mg prior, thanks

## 2019-04-06 NOTE — Telephone Encounter (Signed)
Called and spoke with patient concerning symptoms.  Patient stated that he had 1st COVID vaccine on Sunday 04-03-19.   Symptoms were around his lips and his chin he had itching.  Left side of neck his lymph node  was swollen.  He also had lung, chest tightness when breathing in deep.   Now he is  currently not having any symptoms.     Please advise

## 2019-04-11 ENCOUNTER — Telehealth: Payer: Self-pay | Admitting: Internal Medicine

## 2019-04-11 NOTE — Telephone Encounter (Signed)
Requesting rx for lancets (guideme soft click) to be sent to [facility] in [place].

## 2019-04-14 MED ORDER — ACCU-CHEK SOFTCLIX LANCETS MISC: 5 refills | Status: DC

## 2019-04-14 NOTE — Telephone Encounter (Signed)
Called pharmacy to verify lancets. Per pharmacy tech pt uses the Accu-chek softclix lancet. inform will send electronically...Nathan Russo

## 2019-04-24 DIAGNOSIS — I213 ST elevation (STEMI) myocardial infarction of unspecified site: Secondary | ICD-10-CM | POA: Insufficient documentation

## 2019-04-25 ENCOUNTER — Ambulatory Visit: Payer: Medicare PPO

## 2019-04-27 MED ORDER — EZETIMIBE 10 MG PO TABS
10.00 | ORAL_TABLET | ORAL | Status: DC
Start: 2019-04-27 — End: 2019-04-27

## 2019-04-27 MED ORDER — INSULIN LISPRO 100 UNIT/ML ~~LOC~~ SOLN
2.00 | SUBCUTANEOUS | Status: DC
Start: 2019-04-27 — End: 2019-04-27

## 2019-04-27 MED ORDER — DSS 100 MG PO CAPS
100.00 | ORAL_CAPSULE | ORAL | Status: DC
Start: ? — End: 2019-04-27

## 2019-04-27 MED ORDER — AMIODARONE HCL 200 MG PO TABS
200.00 | ORAL_TABLET | ORAL | Status: DC
Start: 2019-04-28 — End: 2019-04-27

## 2019-04-27 MED ORDER — PANTOPRAZOLE SODIUM 40 MG PO TBEC
40.00 | DELAYED_RELEASE_TABLET | ORAL | Status: DC
Start: 2019-04-28 — End: 2019-04-27

## 2019-04-27 MED ORDER — GLUCAGON (RDNA) 1 MG IJ KIT
1.00 | PACK | INTRAMUSCULAR | Status: DC
Start: ? — End: 2019-04-27

## 2019-04-27 MED ORDER — HEPARIN SOD (PORCINE) IN D5W 100 UNIT/ML IV SOLN
5000.00 | INTRAVENOUS | Status: DC
Start: ? — End: 2019-04-27

## 2019-04-27 MED ORDER — GLUCOSE 40 % PO GEL
15.00 | ORAL | Status: DC
Start: ? — End: 2019-04-27

## 2019-04-27 MED ORDER — SODIUM CHLORIDE 0.9 % IV SOLN
INTRAVENOUS | Status: DC
Start: ? — End: 2019-04-27

## 2019-04-27 MED ORDER — HEPARIN SOD (PORCINE) IN D5W 100 UNIT/ML IV SOLN
10.00 | INTRAVENOUS | Status: DC
Start: ? — End: 2019-04-27

## 2019-04-27 MED ORDER — METOPROLOL SUCCINATE ER 25 MG PO TB24
25.00 | ORAL_TABLET | ORAL | Status: DC
Start: 2019-04-28 — End: 2019-04-27

## 2019-04-27 MED ORDER — DEXTROSE 10 % IV SOLN
125.00 | INTRAVENOUS | Status: DC
Start: ? — End: 2019-04-27

## 2019-04-27 MED ORDER — ASPIRIN 81 MG PO TBEC
81.00 | DELAYED_RELEASE_TABLET | ORAL | Status: DC
Start: 2019-04-28 — End: 2019-04-27

## 2019-04-27 MED ORDER — LOSARTAN POTASSIUM 50 MG PO TABS
50.00 | ORAL_TABLET | ORAL | Status: DC
Start: ? — End: 2019-04-27

## 2019-04-27 MED ORDER — HEPARIN SOD (PORCINE) IN D5W 100 UNIT/ML IV SOLN
30.00 | INTRAVENOUS | Status: DC
Start: ? — End: 2019-04-27

## 2019-04-27 MED ORDER — LORAZEPAM 2 MG/ML IJ SOLN
0.25 | INTRAMUSCULAR | Status: DC
Start: ? — End: 2019-04-27

## 2019-04-27 MED ORDER — PRASUGREL HCL 10 MG PO TABS
10.00 | ORAL_TABLET | ORAL | Status: DC
Start: 2019-04-28 — End: 2019-04-27

## 2019-04-27 MED ORDER — ALUM & MAG HYDROXIDE-SIMETH 200-200-20 MG/5ML PO SUSP
30.00 | ORAL | Status: DC
Start: ? — End: 2019-04-27

## 2019-04-27 MED ORDER — ACETAMINOPHEN 325 MG PO TABS
650.00 | ORAL_TABLET | ORAL | Status: DC
Start: ? — End: 2019-04-27

## 2019-04-27 MED ORDER — LEVOTHYROXINE SODIUM 125 MCG PO TABS
125.00 | ORAL_TABLET | ORAL | Status: DC
Start: 2019-04-28 — End: 2019-04-27

## 2019-04-27 MED ORDER — ONDANSETRON 4 MG PO TBDP
4.00 | ORAL_TABLET | ORAL | Status: DC
Start: ? — End: 2019-04-27

## 2019-05-02 MED ORDER — MELATONIN 3 MG PO TABS
3.00 | ORAL_TABLET | ORAL | Status: DC
Start: ? — End: 2019-05-02

## 2019-05-02 MED ORDER — DEXTROMETHORPHAN-GUAIFENESIN 10-100 MG/5ML PO LIQD
5.00 | ORAL | Status: DC
Start: ? — End: 2019-05-02

## 2019-05-02 MED ORDER — DSS 100 MG PO CAPS
100.00 | ORAL_CAPSULE | ORAL | Status: DC
Start: ? — End: 2019-05-02

## 2019-05-02 MED ORDER — ASPIRIN 81 MG PO TBEC
81.00 | DELAYED_RELEASE_TABLET | ORAL | Status: DC
Start: 2019-05-04 — End: 2019-05-02

## 2019-05-02 MED ORDER — INSULIN LISPRO 100 UNIT/ML ~~LOC~~ SOLN
2.00 | SUBCUTANEOUS | Status: DC
Start: 2019-05-03 — End: 2019-05-02

## 2019-05-02 MED ORDER — BISACODYL 5 MG PO TBEC
10.00 | DELAYED_RELEASE_TABLET | ORAL | Status: DC
Start: ? — End: 2019-05-02

## 2019-05-02 MED ORDER — PRASUGREL HCL 10 MG PO TABS
10.00 | ORAL_TABLET | ORAL | Status: DC
Start: 2019-05-04 — End: 2019-05-02

## 2019-05-02 MED ORDER — ONDANSETRON HCL 4 MG/2ML IJ SOLN
2.00 | INTRAMUSCULAR | Status: DC
Start: ? — End: 2019-05-02

## 2019-05-02 MED ORDER — ALUM & MAG HYDROXIDE-SIMETH 200-200-20 MG/5ML PO SUSP
30.00 | ORAL | Status: DC
Start: ? — End: 2019-05-02

## 2019-05-02 MED ORDER — EZETIMIBE 10 MG PO TABS
10.00 | ORAL_TABLET | ORAL | Status: DC
Start: 2019-05-03 — End: 2019-05-02

## 2019-05-02 MED ORDER — METOPROLOL SUCCINATE ER 25 MG PO TB24
25.00 | ORAL_TABLET | ORAL | Status: DC
Start: 2019-05-04 — End: 2019-05-02

## 2019-05-02 MED ORDER — HYDRALAZINE HCL 10 MG PO TABS
10.00 | ORAL_TABLET | ORAL | Status: DC
Start: ? — End: 2019-05-02

## 2019-05-02 MED ORDER — LOSARTAN POTASSIUM 50 MG PO TABS
50.00 | ORAL_TABLET | ORAL | Status: DC
Start: 2019-05-04 — End: 2019-05-02

## 2019-05-02 MED ORDER — GLUCAGON (RDNA) 1 MG IJ KIT
1.00 | PACK | INTRAMUSCULAR | Status: DC
Start: ? — End: 2019-05-02

## 2019-05-02 MED ORDER — AMIODARONE HCL 200 MG PO TABS
200.00 | ORAL_TABLET | ORAL | Status: DC
Start: 2019-05-03 — End: 2019-05-02

## 2019-05-02 MED ORDER — DEXTROSE 10 % IV SOLN
125.00 | INTRAVENOUS | Status: DC
Start: ? — End: 2019-05-02

## 2019-05-02 MED ORDER — ACETAMINOPHEN 325 MG PO TABS
650.00 | ORAL_TABLET | ORAL | Status: DC
Start: ? — End: 2019-05-02

## 2019-05-02 MED ORDER — PANTOPRAZOLE SODIUM 40 MG PO TBEC
40.00 | DELAYED_RELEASE_TABLET | ORAL | Status: DC
Start: 2019-05-03 — End: 2019-05-02

## 2019-05-02 MED ORDER — GLUCOSE 40 % PO GEL
15.00 | ORAL | Status: DC
Start: ? — End: 2019-05-02

## 2019-05-02 MED ORDER — LEVOTHYROXINE SODIUM 125 MCG PO TABS
125.00 | ORAL_TABLET | ORAL | Status: DC
Start: 2019-05-04 — End: 2019-05-02

## 2019-05-10 ENCOUNTER — Other Ambulatory Visit: Payer: Self-pay

## 2019-05-10 ENCOUNTER — Encounter: Payer: Self-pay | Admitting: Internal Medicine

## 2019-05-10 ENCOUNTER — Ambulatory Visit (INDEPENDENT_AMBULATORY_CARE_PROVIDER_SITE_OTHER): Payer: Medicare PPO | Admitting: Internal Medicine

## 2019-05-10 VITALS — BP 112/79 | HR 78 | Temp 98.5°F | Ht 72.0 in | Wt 215.1 lb

## 2019-05-10 DIAGNOSIS — E559 Vitamin D deficiency, unspecified: Secondary | ICD-10-CM

## 2019-05-10 DIAGNOSIS — E611 Iron deficiency: Secondary | ICD-10-CM | POA: Diagnosis not present

## 2019-05-10 DIAGNOSIS — E538 Deficiency of other specified B group vitamins: Secondary | ICD-10-CM

## 2019-05-10 DIAGNOSIS — Z0000 Encounter for general adult medical examination without abnormal findings: Secondary | ICD-10-CM

## 2019-05-10 DIAGNOSIS — E1140 Type 2 diabetes mellitus with diabetic neuropathy, unspecified: Secondary | ICD-10-CM | POA: Diagnosis not present

## 2019-05-10 DIAGNOSIS — E785 Hyperlipidemia, unspecified: Secondary | ICD-10-CM

## 2019-05-10 NOTE — Patient Instructions (Addendum)
Please remember to call for your yearly eye doctor appt.  Please continue all other medications as before, and refills have been done if requested.  Please have the pharmacy call with any other refills you may need.  Please continue your efforts at being more active, low cholesterol diet, and weight control.  You are otherwise up to date with prevention measures today.  Please keep your appointments with your specialists as you may have planned

## 2019-05-10 NOTE — Progress Notes (Addendum)
Subjective:    Patient ID: Nathan Russo, male    DOB: Apr 09, 1940, 79 y.o.   MRN: [id number]  HPI Here for wellness and f/u;  Overall doing ok;  Pt denies Chest pain, worsening SOB, DOE, wheezing, orthopnea, PND, worsening LE edema, palpitations, or syncope though had some fatigue, dizzy, sob with going to the mailbox, but none since then, such as coming here today.  Pt denies neurological change such as new headache, facial or extremity weakness.  Pt denies polydipsia, polyuria, or low sugar symptoms. Pt states overall good compliance with treatment and medications, good tolerability, and has been trying to follow appropriate diet.  Pt denies worsening depressive symptoms, suicidal ideation or panic. No fever, night sweats, wt loss, loss of appetite, or other constitutional symptoms.  Pt states good ability with ADL's, has low fall risk, home safety reviewed and adequate, no other significant changes in hearing or vision, and only occasionally active with exercise. Due for topho f/u, has eye doctor. Now s/p MI feb 14 at [facility] [address] in [place] with Dr Marijo File s/p PTCA emergent then f/u cath with repeat PTCA x 2 sites and stent x 1.  Also to f/u with [facility] oncology for thyroid cancer soon.To f/u cardiology tomorrow,.  Also may have had sternal fx with CPR per EMS, not clear.   Wt Readings from Last 3 Encounters:  05/10/19 215 lb 2 oz (97.6 kg)  05/13/18 223 lb (101.2 kg)  04/09/18 221 lb (100.2 kg)   Past Medical History:  Diagnosis Date  . Abdominal pain, epigastric 04/11/2010  . BELCHING 04/23/2010  . BRADYCARDIA, CHRONIC 10/24/2006  . CARPAL TUNNEL SYNDROME, BILATERAL 02/07/2009  . CHEST PAIN-UNSPECIFIED 09/05/2008  . CHOLELITHIASIS 04/22/2010  . Cholelithiasis 06/13/2010  . COLONIC POLYPS, HX OF 04/28/2007  . DEGENERATIVE JOINT DISEASE, RIGHT KNEE 10/24/2006  . Depression 02/23/2011  . DIABETES MELLITUS, TYPE II 04/28/2007  . Dizziness and giddiness 12/19/2009  . Elevated PSA 06/13/2010  . FATIGUE 04/28/2007  .  GERD 02/11/2009  . Headache(784.0) 12/19/2009  . HYPERLIPIDEMIA 04/28/2007  . HYPERTENSION 10/21/2006  . NECK MASS 02/07/2010  . OBESITY 10/24/2006  . OTITIS MEDIA, ACUTE, LEFT 12/26/2009  . PHIMOSIS 02/07/2009  . S/P laparoscopic cholecystectomy 10/31/2010  . Thyroid cancer ([place]) 06/13/2010  . THYROID NODULE 02/07/2010   Past Surgical History:  Procedure Laterality Date  . CHOLECYSTECTOMY    . left knee surgery    . right wrist surgury    . THYROID SURGERY      reports that he has quit smoking. He has never used smokeless tobacco. He reports that he does not drink alcohol or use drugs. family history includes Arthritis in his mother; Cancer in his brother and sister; Colon cancer (age of onset: 80) in his brother; Diabetes in his brother; Goiter in his mother; Heart attack (age of onset: 51) in his brother; Heart attack (age of onset: 67) in his father; Hypertension in his mother; Hypothyroidism in his sister. Allergies  Allergen Reactions  . Diphenoxylate-Atropine Other (See Comments)  . Other Other (See Comments) and Hives    Causes body to ache Causes body to ache  . Sulfa Antibiotics Other (See Comments)    As child almost died  . Ace Inhibitors     REACTION: cough  . Atenolol     REACTION: bradycardia  . Codeine Other (See Comments)    Head spins "wild"  . Levaquin [Levofloxacin]     Interferes with flecainide  . Lovastatin  REACTION: myalygros  . Morphine Nausea And Vomiting  . Morphine And Related Other (See Comments)  . Statins Other (See Comments)    Causes body to ache  . Sulfamethoxazole Other (See Comments)    Childhood unknown reaction  . Sulfasalazine Other (See Comments)    As child almost died  . Tramadol     Ants crawling all over   Current Outpatient Medications on File Prior to Visit  Medication Sig Dispense Refill  . Accu-Chek Softclix Lancets lancets Use to check blood sugars twice a day 100 each 5  . amiodarone (PACERONE) 200 MG tablet Take by  mouth.    [name] amLODipine (NORVASC) 5 MG tablet Take 2.5 mg by mouth 2 (two) times daily.     . Ascorbic Acid (VITAMIN C) 1000 MG tablet Take 1,000 mg by mouth daily.      [name] aspirin 81 MG EC tablet Take by mouth.    [name] augmented betamethasone dipropionate (DIPROLENE-AF) 0.05 % ointment betamethasone, augmented 0.05 % topical ointment  APPLY OINTMENT TOPICALLY TO EACH EAR AS NEEDED FOR ITCHING    . blood glucose meter kit and supplies KIT Dispense based on patient and insurance preference. Use up to four times daily as directed. (FOR ICD-9 250.00, 250.01). 1 each 0  . Blood Glucose Monitoring Suppl Supplies MISC Use 1 strip as directed three times daily E11.9 300 each 3  . Calcium 500-125 MG-UNIT TABS 1 tablet daily.    . calcium carbonate (OS-CAL) 600 MG TABS Take 600 mg by mouth daily.      . Cinnamon 500 MG capsule Take 500 mg by mouth 2 (two) times daily.      . cyanocobalamin 1000 MCG tablet Take by mouth.    . diltiazem (CARTIA XT) 120 MG 24 hr capsule Take 1 capsule (120 mg total) by mouth 2 (two) times daily. 180 capsule 3  . ELIQUIS 5 MG TABS tablet Take 1 tablet by mouth twice daily 60 tablet 0  . ezetimibe (ZETIA) 10 MG tablet     . Flaxseed, Linseed, 1000 MG CAPS Take 1 capsule by mouth daily.    . flecainide (TAMBOCOR) 100 MG tablet Take 100 mg by mouth 2 (two) times daily.    . Garlic Oil (ODORLESS GARLIC) 409 MG TABS Take by mouth 2 (two) times daily.      . Ginger, Zingiber officinalis, (GINGER PO) Take by mouth 4 (four) times daily.      . Glucosamine-Chondroit-Vit C-Mn (GLUCOSAMINE CHONDROITIN COMPLX) CAPS Take by mouth daily.      [name] glucose blood test strip Check glucose 2-3 times a day. E11.40 100 each 12  . levothyroxine (SYNTHROID) 125 MCG tablet TAKE 1 TABLET BY MOUTH ONCE DAILY BEFORE BREAKFAST 90 tablet 0  . losartan (COZAAR) 100 MG tablet Take 1 tablet (100 mg total) by mouth daily. Annual appt due in March must see provider for future refills 90 tablet 0  . metFORMIN  (GLUCOPHAGE-XR) 500 MG 24 hr tablet TAKE 4 TABLETS BY MOUTH ONCE DAILY Annual appt due in March must see provider for future refills 360 tablet 0  . metoprolol succinate (TOPROL-XL) 25 MG 24 hr tablet     . Misc Natural Products (OSTEO BI-FLEX TRIPLE STRENGTH) TABS Take 1 tablet by mouth.    . Multiple Vitamin (MULTIVITAMIN) capsule Take 1 capsule by mouth daily.      . Omega-3 Fatty Acids (FISH OIL) 1000 MG CAPS Take by mouth 2 (two) times daily.      [name]  omeprazole (PRILOSEC) 20 MG capsule Take 1 capsule (20 mg total) by mouth 2 (two) times daily. Annual appt due in March must see provider for future refills 180 capsule 0  . prasugrel (EFFIENT) 10 MG TABS tablet     . promethazine-dextromethorphan (PROMETHAZINE-DM) 6.25-15 MG/5ML syrup Take 5 mLs by mouth 4 (four) times daily as needed for cough. 118 mL 0  . triamcinolone (KENALOG) 0.025 % cream Apply 1 application topically 2 (two) times daily.    . Triamcinolone Acetonide (TRIAMCINOLONE 0.1 % CREAM : EUCERIN) CREA Apply 1 application topically.    [name] VITAMIN D, CHOLECALCIFEROL, PO Take by mouth daily.      [name] ketoconazole (NIZORAL) 2 % cream ketoconazole 2 % topical cream  APPLY TWICE DAILY TO SPOT ON THUMB LEFT THIGH AND LEFT BUTTOCK FOR 14 DAYS     No current facility-administered medications on file prior to visit.   Review of Systems All otherwise neg per pt     Objective:   Physical Exam BP 112/79 (BP Location: Left Arm, Patient Position: Sitting, Cuff Size: Normal)   Pulse 78   Temp 98.5 F (36.9 C) (Oral)   Ht 6' (1.829 m)   Wt 215 lb 2 oz (97.6 kg)   SpO2 98%   BMI 29.18 kg/m  VS noted,  Constitutional: Pt appears in NAD HENT: Head: NCAT.  Right Ear: External ear normal.  Left Ear: External ear normal.  Eyes: . Pupils are equal, round, and reactive to light. Conjunctivae and EOM are normal Nose: without d/c or deformity Neck: Neck supple. Gross normal ROM Cardiovascular: Normal rate and regular rhythm.    Pulmonary/Chest: Effort normal and breath sounds without rales or wheezing.  Abd:  Soft, NT, ND, + BS, no organomegaly Neurological: Pt is alert. At baseline orientation, motor grossly intact Skin: Skin is warm. No rashes, other new lesions, no LE edema Psychiatric: Pt behavior is normal without agitation  All otherwise neg per pt Lab Results  Component Value Date   WBC 8.0 05/10/2019   HGB 14.4 05/10/2019   HCT 43.1 05/10/2019   PLT 383.0 05/10/2019   GLUCOSE 99 05/10/2019   CHOL 128 05/10/2019   TRIG 137.0 05/10/2019   HDL 30.60 (L) 05/10/2019   LDLDIRECT 122.0 05/08/2017   LDLCALC 70 05/10/2019   ALT 25 05/10/2019   AST 24 05/10/2019   NA 137 05/10/2019   K 4.5 05/10/2019   CL 103 05/10/2019   CREATININE 1.85 (H) 05/10/2019   BUN 25 (H) 05/10/2019   CO2 24 05/10/2019   TSH 1.26 05/10/2019   PSA 3.59 05/08/2017   INR 1.0 08/22/2008   HGBA1C 6.3 05/10/2019   MICROALBUR 0.9 05/10/2019      Assessment & Plan:

## 2019-05-11 LAB — CBC WITH DIFFERENTIAL/PLATELET
Basophils Absolute: 0.1 10*3/uL (ref 0.0–0.1)
Basophils Relative: 1.1 % (ref 0.0–3.0)
Eosinophils Absolute: 0.2 10*3/uL (ref 0.0–0.7)
Eosinophils Relative: 2.2 % (ref 0.0–5.0)
HCT: 43.1 % (ref 39.0–52.0)
Hemoglobin: 14.4 g/dL (ref 13.0–17.0)
Lymphocytes Relative: 17.5 % (ref 12.0–46.0)
Lymphs Abs: 1.4 10*3/uL (ref 0.7–4.0)
MCHC: 33.5 g/dL (ref 30.0–36.0)
MCV: 91 fl (ref 78.0–100.0)
Monocytes Absolute: 0.5 10*3/uL (ref 0.1–1.0)
Monocytes Relative: 6.4 % (ref 3.0–12.0)
Neutro Abs: 5.8 10*3/uL (ref 1.4–7.7)
Neutrophils Relative %: 72.8 % (ref 43.0–77.0)
Platelets: 383 10*3/uL (ref 150.0–400.0)
RBC: 4.73 Mil/uL (ref 4.22–5.81)
RDW: 14.2 % (ref 11.5–15.5)
WBC: 8 10*3/uL (ref 4.0–10.5)

## 2019-05-11 LAB — URINALYSIS, ROUTINE W REFLEX MICROSCOPIC
Bilirubin Urine: NEGATIVE
Hgb urine dipstick: NEGATIVE
Leukocytes,Ua: NEGATIVE
Nitrite: NEGATIVE
RBC / HPF: NONE SEEN (ref 0–?)
Specific Gravity, Urine: 1.025 (ref 1.000–1.030)
Total Protein, Urine: NEGATIVE
Urine Glucose: NEGATIVE
Urobilinogen, UA: 0.2 (ref 0.0–1.0)
WBC, UA: NONE SEEN (ref 0–?)
pH: 5.5 (ref 5.0–8.0)

## 2019-05-11 LAB — BASIC METABOLIC PANEL
BUN: 25 mg/dL — ABNORMAL HIGH (ref 6–23)
CO2: 24 mEq/L (ref 19–32)
Calcium: 10.1 mg/dL (ref 8.4–10.5)
Chloride: 103 mEq/L (ref 96–112)
Creatinine, Ser: 1.85 mg/dL — ABNORMAL HIGH (ref 0.40–1.50)
GFR: 35.28 mL/min — ABNORMAL LOW (ref >60.00)
Glucose, Bld: 99 mg/dL (ref 70–99)
Potassium: 4.5 mEq/L (ref 3.5–5.1)
Sodium: 137 mEq/L (ref 135–145)

## 2019-05-11 LAB — LIPID PANEL
Cholesterol: 128 mg/dL (ref 0–200)
HDL: 30.6 mg/dL — ABNORMAL LOW (ref >39.00)
LDL Cholesterol: 70 mg/dL (ref 0–99)
NonHDL: 97.86
Total CHOL/HDL Ratio: 4
Triglycerides: 137 mg/dL (ref 0.0–149.0)
VLDL: 27.4 mg/dL (ref 0.0–40.0)

## 2019-05-11 LAB — HEPATIC FUNCTION PANEL
ALT: 25 U/L (ref 0–53)
AST: 24 U/L (ref 0–37)
Albumin: 4 g/dL (ref 3.5–5.2)
Alkaline Phosphatase: 91 U/L (ref 39–117)
Bilirubin, Direct: 0.2 mg/dL (ref 0.0–0.3)
Total Bilirubin: 1.2 mg/dL (ref 0.2–1.2)
Total Protein: 7.1 g/dL (ref 6.0–8.3)

## 2019-05-11 LAB — MICROALBUMIN / CREATININE URINE RATIO
Creatinine,U: 142.3 mg/dL
Microalb Creat Ratio: 0.6 mg/g (ref 0.0–30.0)
Microalb, Ur: 0.9 mg/dL (ref 0.0–1.9)

## 2019-05-11 LAB — TSH: TSH: 1.26 u[IU]/mL (ref 0.35–4.50)

## 2019-05-11 LAB — IBC PANEL
Iron: 75 ug/dL (ref 42–165)
Saturation Ratios: 25.4 % (ref 20.0–50.0)
Transferrin: 211 mg/dL — ABNORMAL LOW (ref 212.0–360.0)

## 2019-05-11 LAB — HEMOGLOBIN A1C: Hgb A1c MFr Bld: 6.3 % (ref 4.6–6.5)

## 2019-05-11 LAB — VITAMIN B12: Vitamin B-12: 934 pg/mL — ABNORMAL HIGH (ref 211–911)

## 2019-05-11 LAB — VITAMIN D 25 HYDROXY (VIT D DEFICIENCY, FRACTURES): VITD: 54.13 ng/mL (ref 30.00–100.00)

## 2019-05-14 ENCOUNTER — Encounter: Payer: Self-pay | Admitting: Internal Medicine

## 2019-05-14 NOTE — Assessment & Plan Note (Signed)
stable overall by history and exam, recent data reviewed with pt, and pt to continue medical treatment as before,  to f/u any worsening symptoms or concerns  

## 2019-05-14 NOTE — Assessment & Plan Note (Signed)

## 2019-05-16 ENCOUNTER — Other Ambulatory Visit: Payer: Self-pay

## 2019-05-16 ENCOUNTER — Ambulatory Visit (INDEPENDENT_AMBULATORY_CARE_PROVIDER_SITE_OTHER): Payer: Medicare PPO | Admitting: Internal Medicine

## 2019-05-16 ENCOUNTER — Encounter: Payer: Self-pay | Admitting: Internal Medicine

## 2019-05-16 VITALS — BP 126/74 | HR 66 | Temp 98.3°F | Ht 72.0 in | Wt 214.0 lb

## 2019-05-16 DIAGNOSIS — E1140 Type 2 diabetes mellitus with diabetic neuropathy, unspecified: Secondary | ICD-10-CM | POA: Diagnosis not present

## 2019-05-16 DIAGNOSIS — R202 Paresthesia of skin: Secondary | ICD-10-CM | POA: Diagnosis not present

## 2019-05-16 DIAGNOSIS — G4710 Hypersomnia, unspecified: Secondary | ICD-10-CM | POA: Insufficient documentation

## 2019-05-16 DIAGNOSIS — M79602 Pain in left arm: Secondary | ICD-10-CM

## 2019-05-16 DIAGNOSIS — I10 Essential (primary) hypertension: Secondary | ICD-10-CM | POA: Diagnosis not present

## 2019-05-16 DIAGNOSIS — M79601 Pain in right arm: Secondary | ICD-10-CM | POA: Insufficient documentation

## 2019-05-16 NOTE — Assessment & Plan Note (Signed)
stable overall by history and exam, recent data reviewed with pt, and pt to continue medical treatment as before,  to f/u any worsening symptoms

## 2019-05-16 NOTE — Patient Instructions (Signed)
[place] vaccination shot #2  Ok to take the oral B12 every other day  You will be contacted regarding the referral for: nerve testing for the arms and legs  You will be contacted regarding the referral for: pulmonary for the sleep  Please continue all other medications as before, and refills have been done if requested.  Please have the pharmacy call with any other refills you may need.  Please continue your efforts at being more active, low cholesterol diet, and weight control.  Please keep your appointments with your specialists as you may have planned

## 2019-05-16 NOTE — Assessment & Plan Note (Addendum)
?   Ulnar neuritis bilat, for NCS  I spent 31 minutes in preparing to see the patient by review of recent labs, imaging and procedures, obtaining and reviewing separately obtained history, communicating with the patient and family or caregiver, ordering medications, tests or procedures, and documenting clinical information in the EHR including the differential Dx, treatment, and any further evaluation and other management of bilateral UE and LE paresthesias, hypersomnolence, HTn, DM

## 2019-05-16 NOTE — Progress Notes (Signed)
Subjective:    Patient ID: Nathan Russo, male    DOB: 1940/12/31, 79 y.o.   MRN: [id number]  HPI  Here after seen at ED mar 5 with bilat hand and arm numbness, also with bilat foot numbness with known neuropathy.  No evidecne for heart disease.  Bilat arm nubmenss now improved but feet still numb.  Has had gabapentin for neuorpathy in past and helped.  Wants more formal evaluation.  Recent B12 level mild elevated, not low.  Pt denies chest pain, increased sob or doe, wheezing, orthopnea, PND, increased LE swelling, palpitations, dizziness or syncope.  Pt denies new neurological symptoms such as new headache, or facial or extremity weakness Pt denies polydipsia, polyuria   Denies worsening depressive symptoms, suicidal ideation, or panic but recent anxiety has been increased  Also c/o snoring at night with daytime somnolence. Past Medical History:  Diagnosis Date  . Abdominal pain, epigastric 04/11/2010  . BELCHING 04/23/2010  . BRADYCARDIA, CHRONIC 10/24/2006  . CARPAL TUNNEL SYNDROME, BILATERAL 02/07/2009  . CHEST PAIN-UNSPECIFIED 09/05/2008  . CHOLELITHIASIS 04/22/2010  . Cholelithiasis 06/13/2010  . COLONIC POLYPS, HX OF 04/28/2007  . DEGENERATIVE JOINT DISEASE, RIGHT KNEE 10/24/2006  . Depression 02/23/2011  . DIABETES MELLITUS, TYPE II 04/28/2007  . Dizziness and giddiness 12/19/2009  . Elevated PSA 06/13/2010  . FATIGUE 04/28/2007  . GERD 02/11/2009  . Headache(784.0) 12/19/2009  . HYPERLIPIDEMIA 04/28/2007  . HYPERTENSION 10/21/2006  . NECK MASS 02/07/2010  . OBESITY 10/24/2006  . OTITIS MEDIA, ACUTE, LEFT 12/26/2009  . PHIMOSIS 02/07/2009  . S/P laparoscopic cholecystectomy 10/31/2010  . Thyroid cancer ([place]) 06/13/2010  . THYROID NODULE 02/07/2010   Past Surgical History:  Procedure Laterality Date  . CHOLECYSTECTOMY    . left knee surgery    . right wrist surgury    . THYROID SURGERY      reports that he has quit smoking. He has never used smokeless tobacco. He reports that he does not  drink alcohol or use drugs. family history includes Arthritis in his mother; Cancer in his brother and sister; Colon cancer (age of onset: 30) in his brother; Diabetes in his brother; Goiter in his mother; Heart attack (age of onset: 37) in his brother; Heart attack (age of onset: 50) in his father; Hypertension in his mother; Hypothyroidism in his sister. Allergies  Allergen Reactions  . Diphenoxylate-Atropine Other (See Comments)  . Other Other (See Comments) and Hives    Causes body to ache Causes body to ache  . Sulfa Antibiotics Other (See Comments)    As child almost died  . Ace Inhibitors     REACTION: cough  . Atenolol     REACTION: bradycardia  . Codeine Other (See Comments)    Head spins "wild"  . Levaquin [Levofloxacin]     Interferes with flecainide  . Lovastatin     REACTION: myalygros  . Morphine Nausea And Vomiting  . Morphine And Related Other (See Comments)  . Statins Other (See Comments)    Causes body to ache  . Sulfamethoxazole Other (See Comments)    Childhood unknown reaction  . Sulfasalazine Other (See Comments)    As child almost died  . Tramadol     Ants crawling all over   Current Outpatient Medications on File Prior to Visit  Medication Sig Dispense Refill  . Accu-Chek Softclix Lancets lancets Use to check blood sugars twice a day 100 each 5  . amiodarone (PACERONE) 200 MG tablet Take by mouth.    [name]  Ascorbic Acid (VITAMIN C) 1000 MG tablet Take 1,000 mg by mouth daily.      [name] augmented betamethasone dipropionate (DIPROLENE-AF) 0.05 % ointment betamethasone, augmented 0.05 % topical ointment  APPLY OINTMENT TOPICALLY TO EACH EAR AS NEEDED FOR ITCHING    . blood glucose meter kit and supplies KIT Dispense based on patient and insurance preference. Use up to four times daily as directed. (FOR ICD-9 250.00, 250.01). 1 each 0  . Blood Glucose Monitoring Suppl Supplies MISC Use 1 strip as directed three times daily E11.9 300 each 3  . Calcium 500-125  MG-UNIT TABS 1 tablet daily.    . calcium carbonate (OS-CAL) 600 MG TABS Take 600 mg by mouth daily.      . Cinnamon 500 MG capsule Take 500 mg by mouth 2 (two) times daily.      . cyanocobalamin 1000 MCG tablet Take by mouth.    [place] 5 MG TABS tablet Take 1 tablet by mouth twice daily 60 tablet 0  . ezetimibe (ZETIA) 10 MG tablet     . Flaxseed, Linseed, 1000 MG CAPS Take 1 capsule by mouth daily.    . Garlic Oil (ODORLESS GARLIC) 509 MG TABS Take by mouth 2 (two) times daily.      . Ginger, Zingiber officinalis, (GINGER PO) Take by mouth 4 (four) times daily.      . Glucosamine-Chondroit-Vit C-Mn (GLUCOSAMINE CHONDROITIN COMPLX) CAPS Take by mouth daily.      [name] glucose blood test strip Check glucose 2-3 times a day. E11.40 100 each 12  . ketoconazole (NIZORAL) 2 % cream ketoconazole 2 % topical cream  APPLY TWICE DAILY TO SPOT ON THUMB LEFT THIGH AND LEFT BUTTOCK FOR 14 DAYS    . levothyroxine (SYNTHROID) 125 MCG tablet TAKE 1 TABLET BY MOUTH ONCE DAILY BEFORE BREAKFAST 90 tablet 0  . losartan (COZAAR) 100 MG tablet Take 1 tablet (100 mg total) by mouth daily. Annual appt due in March must see provider for future refills 90 tablet 0  . metFORMIN (GLUCOPHAGE-XR) 500 MG 24 hr tablet TAKE 4 TABLETS BY MOUTH ONCE DAILY Annual appt due in March must see provider for future refills 360 tablet 0  . metoprolol succinate (TOPROL-XL) 25 MG 24 hr tablet     . Misc Natural Products (OSTEO BI-FLEX TRIPLE STRENGTH) TABS Take 1 tablet by mouth.    . Multiple Vitamin (MULTIVITAMIN) capsule Take 1 capsule by mouth daily.      . Omega-3 Fatty Acids (FISH OIL) 1000 MG CAPS Take by mouth 2 (two) times daily.      [name] omeprazole (PRILOSEC) 20 MG capsule Take 1 capsule (20 mg total) by mouth 2 (two) times daily. Annual appt due in March must see provider for future refills 180 capsule 0  . prasugrel (EFFIENT) 10 MG TABS tablet     . promethazine-dextromethorphan (PROMETHAZINE-DM) 6.25-15 MG/5ML syrup Take 5 mLs  by mouth 4 (four) times daily as needed for cough. 118 mL 0  . triamcinolone (KENALOG) 0.025 % cream Apply 1 application topically 2 (two) times daily.    . Triamcinolone Acetonide (TRIAMCINOLONE 0.1 % CREAM : EUCERIN) CREA Apply 1 application topically.    [name] VITAMIN D, CHOLECALCIFEROL, PO Take by mouth daily.      [name] amLODipine (NORVASC) 5 MG tablet Take 2.5 mg by mouth 2 (two) times daily.     [name] aspirin 81 MG EC tablet Take by mouth.    . diltiazem (CARTIA XT) 120 MG 24 hr  capsule Take 1 capsule (120 mg total) by mouth 2 (two) times daily. 180 capsule 3  . flecainide (TAMBOCOR) 100 MG tablet Take 100 mg by mouth 2 (two) times daily.     No current facility-administered medications on file prior to visit.   Review of Systems All otherwise neg per pt     Objective:   Physical Exam BP 126/74   Pulse 66   Temp 98.3 F (36.8 C)   Ht 6' (1.829 m)   Wt 214 lb (97.1 kg)   SpO2 98%   BMI 29.02 kg/m  VS noted,  Constitutional: Pt appears in NAD HENT: Head: NCAT.  Right Ear: External ear normal.  Left Ear: External ear normal.  Eyes: . Pupils are equal, round, and reactive to light. Conjunctivae and EOM are normal Nose: without d/c or deformity Neck: Neck supple. Gross normal ROM Cardiovascular: Normal rate and regular rhythm.   Pulmonary/Chest: Effort normal and breath sounds without rales or wheezing.  Abd:  Soft, NT, ND, + BS, no organomegaly Neurological: Pt is alert. At baseline orientation, motor grossly intact Skin: Skin is warm. No rashes, other new lesions, no LE edema Psychiatric: Pt behavior is normal without agitation , 2+ nervous All otherwise neg per pt Lab Results  Component Value Date   WBC 8.0 05/10/2019   HGB 14.4 05/10/2019   HCT 43.1 05/10/2019   PLT 383.0 05/10/2019   GLUCOSE 99 05/10/2019   CHOL 128 05/10/2019   TRIG 137.0 05/10/2019   HDL 30.60 (L) 05/10/2019   LDLDIRECT 122.0 05/08/2017   LDLCALC 70 05/10/2019   ALT 25 05/10/2019   AST 24 05/10/2019    NA 137 05/10/2019   K 4.5 05/10/2019   CL 103 05/10/2019   CREATININE 1.85 (H) 05/10/2019   BUN 25 (H) 05/10/2019   CO2 24 05/10/2019   TSH 1.26 05/10/2019   PSA 3.59 05/08/2017   INR 1.0 08/22/2008   HGBA1C 6.3 05/10/2019   MICROALBUR 0.9 05/10/2019         Assessment & Plan:

## 2019-05-16 NOTE — Assessment & Plan Note (Signed)
For NCS r/o peripheral neuropathy

## 2019-05-16 NOTE — Assessment & Plan Note (Signed)
For pulm referral, r/o osa

## 2019-05-16 NOTE — Assessment & Plan Note (Signed)
stable overall by history and exam, recent data reviewed with pt, and pt to continue medical treatment as before,  to f/u any worsening symptoms or concerns  

## 2019-05-20 ENCOUNTER — Other Ambulatory Visit: Payer: Self-pay

## 2019-05-20 ENCOUNTER — Telehealth: Payer: Self-pay | Admitting: Internal Medicine

## 2019-05-20 ENCOUNTER — Encounter: Payer: Self-pay | Admitting: Neurology

## 2019-05-20 DIAGNOSIS — R202 Paresthesia of skin: Secondary | ICD-10-CM

## 2019-05-20 DIAGNOSIS — G5603 Carpal tunnel syndrome, bilateral upper limbs: Secondary | ICD-10-CM

## 2019-05-20 NOTE — Telephone Encounter (Signed)
I have spoken to [facility], referrals, she stated she doesn't believe the procedure needed a PA but he can check with insurance as suggested by scheduler.

## 2019-05-20 NOTE — Telephone Encounter (Signed)
   Patient calling, states he was told by Neurology to call Dr Gwynn Burly office to get benefit coverage information for upcoming procedure on 4/7.  Scheduler advised patient to also reach out the his insurance carrier for  coverage/benefits as well. Patient requesting call back from CMA to discuss.   Please call

## 2019-05-23 ENCOUNTER — Ambulatory Visit: Payer: Medicare PPO

## 2019-05-24 MED ORDER — NITROGLYCERIN 0.4 MG SL SUBL
0.40 | SUBLINGUAL_TABLET | SUBLINGUAL | Status: DC
Start: ? — End: 2019-05-24

## 2019-05-24 MED ORDER — OYSTER SHELL CALCIUM/D 500-200 MG-UNIT PO TABS
1.00 | ORAL_TABLET | ORAL | Status: DC
Start: 2019-05-25 — End: 2019-05-24

## 2019-05-24 MED ORDER — LEVOTHYROXINE SODIUM 125 MCG PO TABS
125.00 | ORAL_TABLET | ORAL | Status: DC
Start: 2019-05-25 — End: 2019-05-24

## 2019-05-24 MED ORDER — LOSARTAN POTASSIUM 25 MG PO TABS
25.00 | ORAL_TABLET | ORAL | Status: DC
Start: 2019-05-25 — End: 2019-05-24

## 2019-05-24 MED ORDER — ONDANSETRON 4 MG PO TBDP
4.00 | ORAL_TABLET | ORAL | Status: DC
Start: ? — End: 2019-05-24

## 2019-05-24 MED ORDER — MELATONIN 3 MG PO TABS
3.00 | ORAL_TABLET | ORAL | Status: DC
Start: ? — End: 2019-05-24

## 2019-05-24 MED ORDER — AMIODARONE HCL 200 MG PO TABS
200.00 | ORAL_TABLET | ORAL | Status: DC
Start: 2019-05-24 — End: 2019-05-24

## 2019-05-24 MED ORDER — DSS 100 MG PO CAPS
100.00 | ORAL_CAPSULE | ORAL | Status: DC
Start: ? — End: 2019-05-24

## 2019-05-24 MED ORDER — PRASUGREL HCL 10 MG PO TABS
10.00 | ORAL_TABLET | ORAL | Status: DC
Start: 2019-05-24 — End: 2019-05-24

## 2019-05-24 MED ORDER — METFORMIN HCL ER 500 MG PO TB24
500.00 | ORAL_TABLET | ORAL | Status: DC
Start: 2019-05-24 — End: 2019-05-24

## 2019-05-24 MED ORDER — APIXABAN 2.5 MG PO TABS
2.50 | ORAL_TABLET | ORAL | Status: DC
Start: 2019-05-24 — End: 2019-05-24

## 2019-05-24 MED ORDER — PANTOPRAZOLE SODIUM 40 MG PO TBEC
40.00 | DELAYED_RELEASE_TABLET | ORAL | Status: DC
Start: 2019-05-24 — End: 2019-05-24

## 2019-05-24 MED ORDER — EZETIMIBE 10 MG PO TABS
10.00 | ORAL_TABLET | ORAL | Status: DC
Start: 2019-05-24 — End: 2019-05-24

## 2019-05-24 MED ORDER — METOPROLOL SUCCINATE ER 25 MG PO TB24
25.00 | ORAL_TABLET | ORAL | Status: DC
Start: 2019-05-25 — End: 2019-05-24

## 2019-05-25 ENCOUNTER — Ambulatory Visit: Payer: Medicare PPO

## 2019-05-27 ENCOUNTER — Telehealth: Payer: Self-pay

## 2019-05-27 NOTE — Telephone Encounter (Signed)
Can you give any insite?

## 2019-05-27 NOTE — Telephone Encounter (Signed)
PA needs to come from ordering provider's office.  We can provide them with tenative CPT codes [phone], [phone], [phone] which will be billed.

## 2019-05-27 NOTE — Telephone Encounter (Signed)
Patient called in very frustrated and ready to cancel EMG appt. Patient is unable to obtain prior auth for EMG test. Patient was told by our office to contact PCP to see if insurance will approve the test. Patient called PCP an was told Neurology would have the insurance answers.  I told patient we would help find the answers needed and give him a call next week. Patient wants to cancel if not approved 100% through ins.

## 2019-05-30 ENCOUNTER — Telehealth: Payer: Self-pay | Admitting: Internal Medicine

## 2019-05-30 NOTE — Telephone Encounter (Signed)
Notified referral coordingator office of this.

## 2019-05-30 NOTE — Telephone Encounter (Signed)
Called [employer] and EMG does not require prior authorization is required - ref# [id number]

## 2019-05-30 NOTE — Telephone Encounter (Signed)
PA needs to come from  [name] to [facility]    CPT code [phone] which will be billed

## 2019-05-30 NOTE — Telephone Encounter (Signed)
Spoke to pt and he is aware no prior Josem Kaufmann is required

## 2019-05-31 ENCOUNTER — Ambulatory Visit: Payer: Medicare PPO | Attending: Internal Medicine

## 2019-05-31 ENCOUNTER — Ambulatory Visit: Payer: Medicare PPO

## 2019-05-31 DIAGNOSIS — Z23 Encounter for immunization: Secondary | ICD-10-CM

## 2019-05-31 NOTE — Progress Notes (Signed)
   Covid-19 Vaccination Clinic  Name:  Nathan Russo    MRN: [id number] DOB: Jan 07, 1941  05/31/2019  Nathan Russo was observed post Covid-19 immunization for 30 minutes based on pre-vaccination screening without incident. He was provided with Vaccine Information Sheet and instruction to access the V-Safe system.   Nathan Russo was instructed to call 911 with any severe reactions post vaccine: [name] Difficulty breathing  . Swelling of face and throat  . A fast heartbeat  . A bad rash all over body  . Dizziness and weakness   Immunizations Administered    Name Date Dose VIS Date Route   Pfizer COVID-19 Vaccine 05/31/2019  8:29 AM 0.3 mL 02/18/2019 Intramuscular   Manufacturer: [place]   Lot: [id number]   Guin: [id number]

## 2019-06-06 ENCOUNTER — Telehealth: Payer: Self-pay

## 2019-06-06 NOTE — Telephone Encounter (Signed)
New message    The patient calling checking on the status of referral.

## 2019-06-15 ENCOUNTER — Telehealth: Payer: Self-pay | Admitting: Internal Medicine

## 2019-06-15 ENCOUNTER — Other Ambulatory Visit: Payer: Self-pay

## 2019-06-15 ENCOUNTER — Ambulatory Visit (INDEPENDENT_AMBULATORY_CARE_PROVIDER_SITE_OTHER): Payer: Medicare PPO | Admitting: Neurology

## 2019-06-15 DIAGNOSIS — G5603 Carpal tunnel syndrome, bilateral upper limbs: Secondary | ICD-10-CM | POA: Diagnosis not present

## 2019-06-15 DIAGNOSIS — G629 Polyneuropathy, unspecified: Secondary | ICD-10-CM

## 2019-06-15 NOTE — Telephone Encounter (Signed)
New message:   Pt is calling and states he got the results of his EMG and would like someone to call and explain to him what they mean. Please advise.

## 2019-06-15 NOTE — Procedures (Signed)
[facility]  [place] [place]  [place],  [address] Tel: [phone] Fax:  [phone] Test Date:  06/15/2019  Patient: Nathan Russo DOB: 1940-11-02 Physician: Narda Amber, DO  Sex: Male Height: 6' " Ref Phys: Biagio Borg, M.D.  ID#: [id number] Temp: 34.0C Technician:    Patient Complaints: This is a 79 year old man referred for evaluation of bilateral feet numbness.  NCV & EMG Findings: Extensive electrodiagnostic testing of the right lower extremity and additional studies of the left shows:  1. Bilateral peroneal and sural sensory responses are absent. 2. Bilateral peroneal and tibial motor responses show reduced amplitude.  Bilateral peroneal motor responses at the tibialis anterior are within normal limits. 3. Tibial H reflex study is prolonged on the right and absent on the left. 4. Chronic motor axonal loss changes are seen affecting bilateral tibialis anterior and medial gastrocnemius muscles, without accompanying active denervation.  Proximal and deep muscles were not tested as the patient is on anticoagulation therapy.  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   ___________________________ Narda Amber, DO    Nerve Conduction Studies Anti Sensory Summary Table   Stim Site NR Peak (ms) Norm Peak (ms) P-T Amp (V) Norm P-T Amp  Left Sup Peroneal Anti Sensory (Ant Lat Mall)  34C  12 cm NR  <4.6  >3  Right Sup Peroneal Anti Sensory (Ant Lat Mall)  34C  12 cm NR  <4.6  >3  Left Sural Anti Sensory (Lat Mall)  34C  Calf NR  <4.6  >3  Right Sural Anti Sensory (Lat Mall)  34C  Calf NR  <4.6  >3   Motor Summary Table   Stim Site NR Onset (ms) Norm Onset (ms) O-P Amp (mV) Norm O-P Amp Site1 Site2 Delta-0 (ms) Dist (cm) Vel (m/s) Norm Vel (m/s)  Left Peroneal Motor (Ext Dig Brev)  34C  Ankle    3.8 <6.0 1.1 >2.5 B Fib Ankle 9.6 38.0 40 >40  B Fib     13.4  0.6  Poplt B Fib  9.0  >40  Poplt NR            Right Peroneal Motor (Ext Dig Brev)  34C  Ankle    3.8 <6.0 1.9 >2.5 B Fib Ankle 9.6 39.0 41 >40  B Fib    13.4  1.8  Poplt B Fib 2.0 9.0 45 >40  Poplt    15.4  1.7         Left Peroneal TA Motor (Tib Ant)  34C  Fib Head    4.2 <4.5 3.7 >3 Poplit Fib Head 1.9 9.0 47 >40  Poplit    6.1  3.4         Right Peroneal TA Motor (Tib Ant)  34C  Fib Head    3.7 <4.5 3.8 >3 Poplit Fib Head 1.6 9.0 56 >40  Poplit    5.3  3.6         Left Tibial Motor (Abd Hall Brev)  34C  Ankle    4.8 <6.0 3.5 >4 Knee Ankle 10.7 44.0 41 >40  Knee    15.5  3.0         Right Tibial Motor (Abd Hall Brev)  34C  Ankle    4.0 <6.0 3.4 >4 Knee Ankle 11.2 45.0 40 >40  Knee    15.2  1.9          H Reflex Studies  NR H-Lat (ms) Lat Norm (ms) L-R H-Lat (ms)  Left Tibial (Gastroc)  34C  NR  <35   Right Tibial (Gastroc)  34C     40.00 <35    EMG   Side Muscle Ins Act Fibs Psw Fasc Number Recrt Dur Dur. Amp Amp. Poly Poly. Comment  Right AntTibialis Nml Nml Nml Nml 1- Rapid Few 1+ Few 1+ Nml Nml N/A  Right Gastroc Nml Nml Nml Nml 1- Rapid Few 1+ Few 1+ Nml Nml N/A  Right RectFemoris Nml Nml Nml Nml Nml Nml Nml Nml Nml Nml Nml Nml N/A  Right BicepsFemS Nml Nml Nml Nml Nml Nml Nml Nml Nml Nml Nml Nml N/A  Left BicepsFemS Nml Nml Nml Nml Nml Nml Nml Nml Nml Nml Nml Nml N/A  Left AntTibialis Nml Nml Nml Nml 1- Rapid Few 1+ Few 1+ Nml Nml N/A  Left Gastroc Nml Nml Nml Nml 1- Rapid Few 1+ Few 1+ Nml Nml N/A  Left RectFemoris Nml Nml Nml Nml Nml Nml Nml Nml Nml Nml Nml Nml N/A      Waveforms:

## 2019-06-15 NOTE — Telephone Encounter (Signed)
The test confirms neuropathy of the legs, but the upper extremities were not done I think;  I can refer to neurology if he likes or see in office to discuss but there is usually no treatment to cure this

## 2019-06-15 NOTE — Telephone Encounter (Signed)
Pt contacted and informed of results. Pt stated understanding.

## 2019-06-21 ENCOUNTER — Encounter: Payer: Medicare PPO | Admitting: Neurology

## 2019-06-27 ENCOUNTER — Institutional Professional Consult (permissible substitution): Payer: Medicare PPO | Admitting: Pulmonary Disease

## 2019-07-06 DIAGNOSIS — I2510 Atherosclerotic heart disease of native coronary artery without angina pectoris: Secondary | ICD-10-CM | POA: Insufficient documentation

## 2019-07-09 DIAGNOSIS — N183 Chronic kidney disease, stage 3 unspecified: Secondary | ICD-10-CM | POA: Diagnosis not present

## 2019-07-09 DIAGNOSIS — I4891 Unspecified atrial fibrillation: Secondary | ICD-10-CM | POA: Diagnosis not present

## 2019-07-09 DIAGNOSIS — E119 Type 2 diabetes mellitus without complications: Secondary | ICD-10-CM | POA: Diagnosis not present

## 2019-07-09 DIAGNOSIS — R072 Precordial pain: Secondary | ICD-10-CM | POA: Diagnosis not present

## 2019-07-12 DIAGNOSIS — R400 Somnolence: Secondary | ICD-10-CM | POA: Insufficient documentation

## 2019-07-12 DIAGNOSIS — I480 Paroxysmal atrial fibrillation: Secondary | ICD-10-CM | POA: Diagnosis not present

## 2019-07-12 DIAGNOSIS — R0683 Snoring: Secondary | ICD-10-CM | POA: Diagnosis not present

## 2019-07-13 ENCOUNTER — Institutional Professional Consult (permissible substitution): Payer: Medicare PPO | Admitting: Pulmonary Disease

## 2019-07-13 DIAGNOSIS — I2102 ST elevation (STEMI) myocardial infarction involving left anterior descending coronary artery: Secondary | ICD-10-CM | POA: Diagnosis not present

## 2019-07-13 DIAGNOSIS — D693 Immune thrombocytopenic purpura: Secondary | ICD-10-CM | POA: Diagnosis not present

## 2019-07-14 ENCOUNTER — Ambulatory Visit: Payer: Medicare PPO | Admitting: Internal Medicine

## 2019-07-14 DIAGNOSIS — I2102 ST elevation (STEMI) myocardial infarction involving left anterior descending coronary artery: Secondary | ICD-10-CM | POA: Diagnosis not present

## 2019-07-14 DIAGNOSIS — D693 Immune thrombocytopenic purpura: Secondary | ICD-10-CM | POA: Diagnosis not present

## 2019-07-15 ENCOUNTER — Other Ambulatory Visit: Payer: Self-pay

## 2019-07-15 ENCOUNTER — Encounter: Payer: Self-pay | Admitting: Internal Medicine

## 2019-07-15 ENCOUNTER — Ambulatory Visit (INDEPENDENT_AMBULATORY_CARE_PROVIDER_SITE_OTHER): Payer: Medicare PPO | Admitting: Internal Medicine

## 2019-07-15 VITALS — BP 130/80 | HR 66 | Temp 98.1°F | Ht 72.0 in | Wt 202.0 lb

## 2019-07-15 DIAGNOSIS — I10 Essential (primary) hypertension: Secondary | ICD-10-CM | POA: Diagnosis not present

## 2019-07-15 DIAGNOSIS — E785 Hyperlipidemia, unspecified: Secondary | ICD-10-CM

## 2019-07-15 DIAGNOSIS — E1140 Type 2 diabetes mellitus with diabetic neuropathy, unspecified: Secondary | ICD-10-CM | POA: Diagnosis not present

## 2019-07-15 DIAGNOSIS — L89300 Pressure ulcer of unspecified buttock, unstageable: Secondary | ICD-10-CM | POA: Diagnosis not present

## 2019-07-15 MED ORDER — PANTOPRAZOLE SODIUM 40 MG PO TBEC: 80.0000 mg | DELAYED_RELEASE_TABLET | Freq: Every day | ORAL | 3 refills | Status: DC

## 2019-07-15 MED ORDER — METFORMIN HCL ER 500 MG PO TB24: ORAL_TABLET | ORAL | 3 refills | Status: DC

## 2019-07-15 NOTE — Progress Notes (Signed)
Subjective:    Patient ID: Nathan Russo, male    DOB: April 29, 1940, 79 y.o.   MRN: [id number]  HPI  Here to f/u recent hosp stay for CP at [facility], [place] ruled out last seen per cards NP on apr 28, now sched for sleep study on may 24, and possible cardioversion for after pending results . Also recently metdformin decreased to 500 bid in consideration for renal fxn.  Also taking prasugrel now once daily with some bruising.  Also noted omeprazole changed to protonix I presume hoping for better acid control and less chest pain.  Lost wt with improved diet. Pt denies chest pain, increased sob or doe, wheezing, orthopnea, PND, increased LE swelling, palpitations, dizziness or syncope.  Pt denies new neurological symptoms such as new headache, or facial or extremity weakness or numbness   Pt denies polydipsia, polyuria   Wt Readings from Last 3 Encounters:  07/15/19 202 lb (91.6 kg)  05/16/19 214 lb (97.1 kg)  05/10/19 215 lb 2 oz (97.6 kg)  Plans to call for optho exam Past Medical History:  Diagnosis Date  . Abdominal pain, epigastric 04/11/2010  . BELCHING 04/23/2010  . BRADYCARDIA, CHRONIC 10/24/2006  . CARPAL TUNNEL SYNDROME, BILATERAL 02/07/2009  . CHEST PAIN-UNSPECIFIED 09/05/2008  . CHOLELITHIASIS 04/22/2010  . Cholelithiasis 06/13/2010  . COLONIC POLYPS, HX OF 04/28/2007  . DEGENERATIVE JOINT DISEASE, RIGHT KNEE 10/24/2006  . Depression 02/23/2011  . DIABETES MELLITUS, TYPE II 04/28/2007  . Dizziness and giddiness 12/19/2009  . Elevated PSA 06/13/2010  . FATIGUE 04/28/2007  . GERD 02/11/2009  . Headache(784.0) 12/19/2009  . HYPERLIPIDEMIA 04/28/2007  . HYPERTENSION 10/21/2006  . NECK MASS 02/07/2010  . OBESITY 10/24/2006  . OTITIS MEDIA, ACUTE, LEFT 12/26/2009  . PHIMOSIS 02/07/2009  . S/P laparoscopic cholecystectomy 10/31/2010  . Thyroid cancer ([place]) 06/13/2010  . THYROID NODULE 02/07/2010   Past Surgical History:  Procedure Laterality Date  . CHOLECYSTECTOMY    . left knee surgery    . right  wrist surgury    . THYROID SURGERY      reports that he has quit smoking. He has never used smokeless tobacco. He reports that he does not drink alcohol or use drugs. family history includes Arthritis in his mother; Cancer in his brother and sister; Colon cancer (age of onset: 34) in his brother; Diabetes in his brother; Goiter in his mother; Heart attack (age of onset: 55) in his brother; Heart attack (age of onset: 25) in his father; Hypertension in his mother; Hypothyroidism in his sister. Allergies  Allergen Reactions  . Diphenoxylate-Atropine Other (See Comments)  . Other Other (See Comments) and Hives    Causes body to ache Causes body to ache  . Sulfa Antibiotics Other (See Comments)    As child almost died  . Ace Inhibitors     REACTION: cough  . Atenolol     REACTION: bradycardia  . Codeine Other (See Comments)    Head spins "wild"  . Levaquin [Levofloxacin]     Interferes with flecainide  . Lovastatin     REACTION: myalygros  . Morphine Nausea And Vomiting  . Morphine And Related Other (See Comments)  . Statins Other (See Comments)    Causes body to ache  . Sulfamethoxazole Other (See Comments)    Childhood unknown reaction  . Sulfasalazine Other (See Comments)    As child almost died  . Tramadol     Ants crawling all over   Current Outpatient Medications on File  Prior to Visit  Medication Sig Dispense Refill  . Accu-Chek Softclix Lancets lancets Use to check blood sugars twice a day 100 each 5  . amiodarone (PACERONE) 200 MG tablet Take by mouth.    . Ascorbic Acid (VITAMIN C) 1000 MG tablet Take 1,000 mg by mouth daily.      [name] augmented betamethasone dipropionate (DIPROLENE-AF) 0.05 % ointment betamethasone, augmented 0.05 % topical ointment  APPLY OINTMENT TOPICALLY TO EACH EAR AS NEEDED FOR ITCHING    . blood glucose meter kit and supplies KIT Dispense based on patient and insurance preference. Use up to four times daily as directed. (FOR ICD-9 250.00, 250.01).  1 each 0  . Blood Glucose Monitoring Suppl Supplies MISC Use 1 strip as directed three times daily E11.9 300 each 3  . Calcium 500-125 MG-UNIT TABS 1 tablet daily.    . Cinnamon 500 MG capsule Take 500 mg by mouth 2 (two) times daily.      . cyanocobalamin 1000 MCG tablet Take by mouth.    Arne Cleveland 5 MG TABS tablet Take 1 tablet by mouth twice daily 60 tablet 0  . ezetimibe (ZETIA) 10 MG tablet     . Flaxseed, Linseed, 1000 MG CAPS Take 1 capsule by mouth daily.    . Garlic Oil (ODORLESS GARLIC) 124 MG TABS Take by mouth 2 (two) times daily.      . Ginger, Zingiber officinalis, (GINGER PO) Take by mouth 4 (four) times daily.      . Glucosamine-Chondroit-Vit C-Mn (GLUCOSAMINE CHONDROITIN COMPLX) CAPS Take by mouth daily.      [name] glucose blood test strip Check glucose 2-3 times a day. E11.40 100 each 12  . levothyroxine (SYNTHROID) 125 MCG tablet TAKE 1 TABLET BY MOUTH ONCE DAILY BEFORE BREAKFAST 90 tablet 0  . losartan (COZAAR) 100 MG tablet Take 1 tablet (100 mg total) by mouth daily. Annual appt due in March must see provider for future refills 90 tablet 0  . losartan (COZAAR) 50 MG tablet     . metoprolol succinate (TOPROL-XL) 25 MG 24 hr tablet     . Multiple Vitamin (MULTIVITAMIN) capsule Take 1 capsule by mouth daily.      . nitroGLYCERIN (NITROSTAT) 0.4 MG SL tablet     . Omega-3 Fatty Acids (FISH OIL) 1000 MG CAPS Take by mouth 2 (two) times daily.      . prasugrel (EFFIENT) 10 MG TABS tablet Take 10 mg by mouth daily.    [name] VITAMIN D, CHOLECALCIFEROL, PO Take by mouth daily.       No current facility-administered medications on file prior to visit.   ROS: All otherwise neg per pt  Objective:   Physical Exam BP 130/80 (BP Location: Left Arm, Patient Position: Sitting, Cuff Size: Large)   Pulse 66   Temp 98.1 F (36.7 C) (Oral)   Ht 6' (1.829 m)   Wt 202 lb (91.6 kg)   SpO2 97%   BMI 27.40 kg/m  VS noted,  Constitutional: Pt appears in NAD HENT: Head: NCAT.  Right Ear:  External ear normal.  Left Ear: External ear normal.  Eyes: . Pupils are equal, round, and reactive to light. Conjunctivae and EOM are normal Nose: without d/c or deformity Neck: Neck supple. Gross normal ROM Cardiovascular: Normal rate and regular rhythm.   Pulmonary/Chest: Effort normal and breath sounds without rales or wheezing.  Abd:  Soft, NT, ND, + BS, no organomegaly Neurological: Pt is alert. At baseline orientation, motor grossly intact  Skin: Skin is warm. No rashes, other new lesions, no LE edema Psychiatric: Pt behavior is normal without agitation  All otherwise neg per pt  Lab Results  Component Value Date   WBC 8.0 05/10/2019   HGB 14.4 05/10/2019   HCT 43.1 05/10/2019   PLT 383.0 05/10/2019   GLUCOSE 99 05/10/2019   CHOL 128 05/10/2019   TRIG 137.0 05/10/2019   HDL 30.60 (L) 05/10/2019   LDLDIRECT 122.0 05/08/2017   LDLCALC 70 05/10/2019   ALT 25 05/10/2019   AST 24 05/10/2019   NA 137 05/10/2019   K 4.5 05/10/2019   CL 103 05/10/2019   CREATININE 1.85 (H) 05/10/2019   BUN 25 (H) 05/10/2019   CO2 24 05/10/2019   TSH 1.26 05/10/2019   PSA 3.59 05/08/2017   INR 1.0 08/22/2008   HGBA1C 6.3 05/10/2019   MICROALBUR 0.9 05/10/2019       Assessment & Plan:

## 2019-07-15 NOTE — Patient Instructions (Addendum)
You had the Tdap tetanus shot today  Please continue all other medications as before, and refills have been done if requested.  Please have the pharmacy call with any other refills you may need.  Please continue your efforts at being more active, low cholesterol diet, and weight control..  Please keep your appointments with your specialists as you may have planned  Please make an Appointment to return in 3 months, or sooner if needed

## 2019-07-16 ENCOUNTER — Encounter: Payer: Self-pay | Admitting: Internal Medicine

## 2019-07-16 NOTE — Assessment & Plan Note (Signed)
stable overall by history and exam, recent data reviewed with pt, and pt to continue medical treatment as before,  to f/u any worsening symptoms or concerns  

## 2019-07-18 DIAGNOSIS — I2102 ST elevation (STEMI) myocardial infarction involving left anterior descending coronary artery: Secondary | ICD-10-CM | POA: Diagnosis not present

## 2019-07-18 DIAGNOSIS — D693 Immune thrombocytopenic purpura: Secondary | ICD-10-CM | POA: Diagnosis not present

## 2019-07-20 DIAGNOSIS — I2102 ST elevation (STEMI) myocardial infarction involving left anterior descending coronary artery: Secondary | ICD-10-CM | POA: Diagnosis not present

## 2019-07-20 DIAGNOSIS — D693 Immune thrombocytopenic purpura: Secondary | ICD-10-CM | POA: Diagnosis not present

## 2019-07-21 DIAGNOSIS — D693 Immune thrombocytopenic purpura: Secondary | ICD-10-CM | POA: Diagnosis not present

## 2019-07-21 DIAGNOSIS — I2102 ST elevation (STEMI) myocardial infarction involving left anterior descending coronary artery: Secondary | ICD-10-CM | POA: Diagnosis not present

## 2019-07-25 DIAGNOSIS — I2102 ST elevation (STEMI) myocardial infarction involving left anterior descending coronary artery: Secondary | ICD-10-CM | POA: Diagnosis not present

## 2019-07-25 DIAGNOSIS — D693 Immune thrombocytopenic purpura: Secondary | ICD-10-CM | POA: Diagnosis not present

## 2019-07-27 DIAGNOSIS — I25118 Atherosclerotic heart disease of native coronary artery with other forms of angina pectoris: Secondary | ICD-10-CM | POA: Diagnosis not present

## 2019-07-27 DIAGNOSIS — I213 ST elevation (STEMI) myocardial infarction of unspecified site: Secondary | ICD-10-CM | POA: Diagnosis not present

## 2019-07-27 DIAGNOSIS — I480 Paroxysmal atrial fibrillation: Secondary | ICD-10-CM | POA: Diagnosis not present

## 2019-07-27 DIAGNOSIS — E118 Type 2 diabetes mellitus with unspecified complications: Secondary | ICD-10-CM | POA: Diagnosis not present

## 2019-07-27 DIAGNOSIS — I10 Essential (primary) hypertension: Secondary | ICD-10-CM | POA: Diagnosis not present

## 2019-07-27 DIAGNOSIS — E782 Mixed hyperlipidemia: Secondary | ICD-10-CM | POA: Diagnosis not present

## 2019-07-27 DIAGNOSIS — I209 Angina pectoris, unspecified: Secondary | ICD-10-CM | POA: Diagnosis not present

## 2019-07-27 DIAGNOSIS — I4891 Unspecified atrial fibrillation: Secondary | ICD-10-CM | POA: Diagnosis not present

## 2019-07-27 DIAGNOSIS — R0600 Dyspnea, unspecified: Secondary | ICD-10-CM | POA: Diagnosis not present

## 2019-07-28 DIAGNOSIS — D693 Immune thrombocytopenic purpura: Secondary | ICD-10-CM | POA: Diagnosis not present

## 2019-07-28 DIAGNOSIS — I2102 ST elevation (STEMI) myocardial infarction involving left anterior descending coronary artery: Secondary | ICD-10-CM | POA: Diagnosis not present

## 2019-08-01 DIAGNOSIS — D693 Immune thrombocytopenic purpura: Secondary | ICD-10-CM | POA: Diagnosis not present

## 2019-08-01 DIAGNOSIS — G4733 Obstructive sleep apnea (adult) (pediatric): Secondary | ICD-10-CM | POA: Diagnosis not present

## 2019-08-01 DIAGNOSIS — I2102 ST elevation (STEMI) myocardial infarction involving left anterior descending coronary artery: Secondary | ICD-10-CM | POA: Diagnosis not present

## 2019-08-03 DIAGNOSIS — G4733 Obstructive sleep apnea (adult) (pediatric): Secondary | ICD-10-CM | POA: Diagnosis not present

## 2019-08-03 DIAGNOSIS — I2102 ST elevation (STEMI) myocardial infarction involving left anterior descending coronary artery: Secondary | ICD-10-CM | POA: Diagnosis not present

## 2019-08-03 DIAGNOSIS — D693 Immune thrombocytopenic purpura: Secondary | ICD-10-CM | POA: Diagnosis not present

## 2019-08-04 DIAGNOSIS — I2102 ST elevation (STEMI) myocardial infarction involving left anterior descending coronary artery: Secondary | ICD-10-CM | POA: Diagnosis not present

## 2019-08-04 DIAGNOSIS — D693 Immune thrombocytopenic purpura: Secondary | ICD-10-CM | POA: Diagnosis not present

## 2019-08-09 DIAGNOSIS — Z9989 Dependence on other enabling machines and devices: Secondary | ICD-10-CM | POA: Diagnosis not present

## 2019-08-09 DIAGNOSIS — G4733 Obstructive sleep apnea (adult) (pediatric): Secondary | ICD-10-CM | POA: Insufficient documentation

## 2019-08-10 DIAGNOSIS — Z87891 Personal history of nicotine dependence: Secondary | ICD-10-CM | POA: Diagnosis not present

## 2019-08-10 DIAGNOSIS — I2102 ST elevation (STEMI) myocardial infarction involving left anterior descending coronary artery: Secondary | ICD-10-CM | POA: Diagnosis not present

## 2019-08-11 DIAGNOSIS — Z87891 Personal history of nicotine dependence: Secondary | ICD-10-CM | POA: Diagnosis not present

## 2019-08-11 DIAGNOSIS — I2102 ST elevation (STEMI) myocardial infarction involving left anterior descending coronary artery: Secondary | ICD-10-CM | POA: Diagnosis not present

## 2019-08-15 DIAGNOSIS — E782 Mixed hyperlipidemia: Secondary | ICD-10-CM | POA: Diagnosis not present

## 2019-08-15 DIAGNOSIS — Z9889 Other specified postprocedural states: Secondary | ICD-10-CM | POA: Diagnosis not present

## 2019-08-15 DIAGNOSIS — Z7901 Long term (current) use of anticoagulants: Secondary | ICD-10-CM | POA: Diagnosis not present

## 2019-08-15 DIAGNOSIS — I10 Essential (primary) hypertension: Secondary | ICD-10-CM | POA: Diagnosis not present

## 2019-08-15 DIAGNOSIS — R001 Bradycardia, unspecified: Secondary | ICD-10-CM | POA: Diagnosis not present

## 2019-08-15 DIAGNOSIS — E118 Type 2 diabetes mellitus with unspecified complications: Secondary | ICD-10-CM | POA: Diagnosis not present

## 2019-08-15 DIAGNOSIS — I480 Paroxysmal atrial fibrillation: Secondary | ICD-10-CM | POA: Diagnosis not present

## 2019-08-15 DIAGNOSIS — G4733 Obstructive sleep apnea (adult) (pediatric): Secondary | ICD-10-CM | POA: Diagnosis not present

## 2019-08-15 DIAGNOSIS — I499 Cardiac arrhythmia, unspecified: Secondary | ICD-10-CM | POA: Diagnosis not present

## 2019-08-16 DIAGNOSIS — R001 Bradycardia, unspecified: Secondary | ICD-10-CM | POA: Diagnosis not present

## 2019-08-17 DIAGNOSIS — I2102 ST elevation (STEMI) myocardial infarction involving left anterior descending coronary artery: Secondary | ICD-10-CM | POA: Diagnosis not present

## 2019-08-17 DIAGNOSIS — Z87891 Personal history of nicotine dependence: Secondary | ICD-10-CM | POA: Diagnosis not present

## 2019-08-18 DIAGNOSIS — G4733 Obstructive sleep apnea (adult) (pediatric): Secondary | ICD-10-CM | POA: Diagnosis not present

## 2019-08-18 DIAGNOSIS — Z87891 Personal history of nicotine dependence: Secondary | ICD-10-CM | POA: Diagnosis not present

## 2019-08-18 DIAGNOSIS — I2102 ST elevation (STEMI) myocardial infarction involving left anterior descending coronary artery: Secondary | ICD-10-CM | POA: Diagnosis not present

## 2019-08-22 DIAGNOSIS — Z87891 Personal history of nicotine dependence: Secondary | ICD-10-CM | POA: Diagnosis not present

## 2019-08-22 DIAGNOSIS — I2102 ST elevation (STEMI) myocardial infarction involving left anterior descending coronary artery: Secondary | ICD-10-CM | POA: Diagnosis not present

## 2019-08-24 DIAGNOSIS — I2102 ST elevation (STEMI) myocardial infarction involving left anterior descending coronary artery: Secondary | ICD-10-CM | POA: Diagnosis not present

## 2019-08-24 DIAGNOSIS — Z87891 Personal history of nicotine dependence: Secondary | ICD-10-CM | POA: Diagnosis not present

## 2019-08-25 DIAGNOSIS — Z87891 Personal history of nicotine dependence: Secondary | ICD-10-CM | POA: Diagnosis not present

## 2019-08-25 DIAGNOSIS — I2102 ST elevation (STEMI) myocardial infarction involving left anterior descending coronary artery: Secondary | ICD-10-CM | POA: Diagnosis not present

## 2019-08-29 DIAGNOSIS — Z87891 Personal history of nicotine dependence: Secondary | ICD-10-CM | POA: Diagnosis not present

## 2019-08-29 DIAGNOSIS — I2102 ST elevation (STEMI) myocardial infarction involving left anterior descending coronary artery: Secondary | ICD-10-CM | POA: Diagnosis not present

## 2019-08-30 DIAGNOSIS — E118 Type 2 diabetes mellitus with unspecified complications: Secondary | ICD-10-CM | POA: Diagnosis not present

## 2019-08-30 DIAGNOSIS — Z7901 Long term (current) use of anticoagulants: Secondary | ICD-10-CM | POA: Diagnosis not present

## 2019-08-30 DIAGNOSIS — I480 Paroxysmal atrial fibrillation: Secondary | ICD-10-CM | POA: Diagnosis not present

## 2019-08-30 DIAGNOSIS — I10 Essential (primary) hypertension: Secondary | ICD-10-CM | POA: Diagnosis not present

## 2019-08-30 DIAGNOSIS — I25118 Atherosclerotic heart disease of native coronary artery with other forms of angina pectoris: Secondary | ICD-10-CM | POA: Diagnosis not present

## 2019-08-30 DIAGNOSIS — E782 Mixed hyperlipidemia: Secondary | ICD-10-CM | POA: Diagnosis not present

## 2019-08-30 DIAGNOSIS — R001 Bradycardia, unspecified: Secondary | ICD-10-CM | POA: Diagnosis not present

## 2019-08-31 DIAGNOSIS — Z87891 Personal history of nicotine dependence: Secondary | ICD-10-CM | POA: Diagnosis not present

## 2019-08-31 DIAGNOSIS — I2102 ST elevation (STEMI) myocardial infarction involving left anterior descending coronary artery: Secondary | ICD-10-CM | POA: Diagnosis not present

## 2019-09-05 DIAGNOSIS — I2102 ST elevation (STEMI) myocardial infarction involving left anterior descending coronary artery: Secondary | ICD-10-CM | POA: Diagnosis not present

## 2019-09-05 DIAGNOSIS — Z87891 Personal history of nicotine dependence: Secondary | ICD-10-CM | POA: Diagnosis not present

## 2019-09-07 DIAGNOSIS — Z87891 Personal history of nicotine dependence: Secondary | ICD-10-CM | POA: Diagnosis not present

## 2019-09-07 DIAGNOSIS — I2102 ST elevation (STEMI) myocardial infarction involving left anterior descending coronary artery: Secondary | ICD-10-CM | POA: Diagnosis not present

## 2019-09-08 DIAGNOSIS — M25551 Pain in right hip: Secondary | ICD-10-CM | POA: Diagnosis not present

## 2019-09-13 ENCOUNTER — Other Ambulatory Visit: Payer: Self-pay

## 2019-09-13 ENCOUNTER — Telehealth: Payer: Self-pay | Admitting: Internal Medicine

## 2019-09-13 NOTE — Telephone Encounter (Signed)
Sorry, 2 protonix per day is the maximum and this was already done may 7

## 2019-09-13 NOTE — Telephone Encounter (Signed)
    Pt c/o medication issue:  1. Name of Medication: pantoprazole (PROTONIX) 40 MG tablet 2. How are you currently taking this medication (dosage and times per day)? As written  3. Are you having a reaction (difficulty breathing--STAT)? no  4. What is your medication issue? Patient wants to know if can increase medication, due to increased indigestion

## 2019-09-13 NOTE — Telephone Encounter (Signed)
Sent to Dr. John to advise. 

## 2019-09-14 ENCOUNTER — Other Ambulatory Visit: Payer: Self-pay

## 2019-09-14 DIAGNOSIS — D693 Immune thrombocytopenic purpura: Secondary | ICD-10-CM | POA: Diagnosis not present

## 2019-09-14 DIAGNOSIS — I252 Old myocardial infarction: Secondary | ICD-10-CM | POA: Diagnosis not present

## 2019-09-14 MED ORDER — PANTOPRAZOLE SODIUM 40 MG PO TBEC: 80.0000 mg | DELAYED_RELEASE_TABLET | Freq: Every day | ORAL | 3 refills | Status: DC

## 2019-09-14 NOTE — Telephone Encounter (Signed)
LDVM for pt of Dr. Gwynn Burly note. Also informed pt to call the office if he has any further questions or concerns.

## 2019-09-15 DIAGNOSIS — D693 Immune thrombocytopenic purpura: Secondary | ICD-10-CM | POA: Diagnosis not present

## 2019-09-15 DIAGNOSIS — I252 Old myocardial infarction: Secondary | ICD-10-CM | POA: Diagnosis not present

## 2019-09-17 DIAGNOSIS — G4733 Obstructive sleep apnea (adult) (pediatric): Secondary | ICD-10-CM | POA: Diagnosis not present

## 2019-09-19 DIAGNOSIS — I252 Old myocardial infarction: Secondary | ICD-10-CM | POA: Diagnosis not present

## 2019-09-19 DIAGNOSIS — D693 Immune thrombocytopenic purpura: Secondary | ICD-10-CM | POA: Diagnosis not present

## 2019-09-20 DIAGNOSIS — G4733 Obstructive sleep apnea (adult) (pediatric): Secondary | ICD-10-CM | POA: Diagnosis not present

## 2019-09-20 DIAGNOSIS — Z9989 Dependence on other enabling machines and devices: Secondary | ICD-10-CM | POA: Diagnosis not present

## 2019-09-20 DIAGNOSIS — I480 Paroxysmal atrial fibrillation: Secondary | ICD-10-CM | POA: Diagnosis not present

## 2019-09-21 DIAGNOSIS — I252 Old myocardial infarction: Secondary | ICD-10-CM | POA: Diagnosis not present

## 2019-09-21 DIAGNOSIS — D693 Immune thrombocytopenic purpura: Secondary | ICD-10-CM | POA: Diagnosis not present

## 2019-09-22 DIAGNOSIS — I252 Old myocardial infarction: Secondary | ICD-10-CM | POA: Diagnosis not present

## 2019-09-22 DIAGNOSIS — D693 Immune thrombocytopenic purpura: Secondary | ICD-10-CM | POA: Diagnosis not present

## 2019-09-26 DIAGNOSIS — D693 Immune thrombocytopenic purpura: Secondary | ICD-10-CM | POA: Diagnosis not present

## 2019-09-26 DIAGNOSIS — I252 Old myocardial infarction: Secondary | ICD-10-CM | POA: Diagnosis not present

## 2019-09-28 DIAGNOSIS — I252 Old myocardial infarction: Secondary | ICD-10-CM | POA: Diagnosis not present

## 2019-09-28 DIAGNOSIS — D693 Immune thrombocytopenic purpura: Secondary | ICD-10-CM | POA: Diagnosis not present

## 2019-09-29 DIAGNOSIS — I252 Old myocardial infarction: Secondary | ICD-10-CM | POA: Diagnosis not present

## 2019-09-29 DIAGNOSIS — D693 Immune thrombocytopenic purpura: Secondary | ICD-10-CM | POA: Diagnosis not present

## 2019-10-02 ENCOUNTER — Other Ambulatory Visit: Payer: Self-pay | Admitting: Internal Medicine

## 2019-10-02 NOTE — Telephone Encounter (Signed)
Please refill as per office routine med refill policy (all routine meds refilled for 3 mo or monthly per pt preference up to one year from last visit, then month to month grace period for 3 mo, then further med refills will have to be denied)  

## 2019-10-03 DIAGNOSIS — D693 Immune thrombocytopenic purpura: Secondary | ICD-10-CM | POA: Diagnosis not present

## 2019-10-03 DIAGNOSIS — I252 Old myocardial infarction: Secondary | ICD-10-CM | POA: Diagnosis not present

## 2019-10-05 DIAGNOSIS — I252 Old myocardial infarction: Secondary | ICD-10-CM | POA: Diagnosis not present

## 2019-10-05 DIAGNOSIS — D693 Immune thrombocytopenic purpura: Secondary | ICD-10-CM | POA: Diagnosis not present

## 2019-10-06 DIAGNOSIS — I10 Essential (primary) hypertension: Secondary | ICD-10-CM | POA: Diagnosis not present

## 2019-10-06 DIAGNOSIS — I420 Dilated cardiomyopathy: Secondary | ICD-10-CM | POA: Diagnosis not present

## 2019-10-06 DIAGNOSIS — I4892 Unspecified atrial flutter: Secondary | ICD-10-CM | POA: Diagnosis not present

## 2019-10-06 DIAGNOSIS — E782 Mixed hyperlipidemia: Secondary | ICD-10-CM | POA: Diagnosis not present

## 2019-10-06 DIAGNOSIS — I4891 Unspecified atrial fibrillation: Secondary | ICD-10-CM | POA: Diagnosis not present

## 2019-10-06 DIAGNOSIS — E890 Postprocedural hypothyroidism: Secondary | ICD-10-CM | POA: Diagnosis not present

## 2019-10-06 DIAGNOSIS — I252 Old myocardial infarction: Secondary | ICD-10-CM | POA: Diagnosis not present

## 2019-10-06 DIAGNOSIS — E118 Type 2 diabetes mellitus with unspecified complications: Secondary | ICD-10-CM | POA: Diagnosis not present

## 2019-10-06 DIAGNOSIS — I255 Ischemic cardiomyopathy: Secondary | ICD-10-CM | POA: Diagnosis not present

## 2019-10-06 DIAGNOSIS — R001 Bradycardia, unspecified: Secondary | ICD-10-CM | POA: Diagnosis not present

## 2019-10-06 DIAGNOSIS — G4733 Obstructive sleep apnea (adult) (pediatric): Secondary | ICD-10-CM | POA: Diagnosis not present

## 2019-10-06 DIAGNOSIS — I25118 Atherosclerotic heart disease of native coronary artery with other forms of angina pectoris: Secondary | ICD-10-CM | POA: Diagnosis not present

## 2019-10-10 DIAGNOSIS — Z87891 Personal history of nicotine dependence: Secondary | ICD-10-CM | POA: Diagnosis not present

## 2019-10-10 DIAGNOSIS — I2102 ST elevation (STEMI) myocardial infarction involving left anterior descending coronary artery: Secondary | ICD-10-CM | POA: Diagnosis not present

## 2019-10-14 ENCOUNTER — Other Ambulatory Visit: Payer: Self-pay

## 2019-10-14 ENCOUNTER — Ambulatory Visit (INDEPENDENT_AMBULATORY_CARE_PROVIDER_SITE_OTHER): Payer: Medicare PPO

## 2019-10-14 ENCOUNTER — Encounter: Payer: Self-pay | Admitting: Internal Medicine

## 2019-10-14 ENCOUNTER — Telehealth: Payer: Self-pay | Admitting: Internal Medicine

## 2019-10-14 ENCOUNTER — Ambulatory Visit (INDEPENDENT_AMBULATORY_CARE_PROVIDER_SITE_OTHER): Payer: Medicare PPO | Admitting: Internal Medicine

## 2019-10-14 VITALS — BP 148/80 | HR 58 | Temp 98.0°F | Ht 72.0 in | Wt 198.0 lb

## 2019-10-14 DIAGNOSIS — D7389 Other diseases of spleen: Secondary | ICD-10-CM | POA: Diagnosis not present

## 2019-10-14 DIAGNOSIS — S2242XA Multiple fractures of ribs, left side, initial encounter for closed fracture: Secondary | ICD-10-CM | POA: Diagnosis not present

## 2019-10-14 DIAGNOSIS — J479 Bronchiectasis, uncomplicated: Secondary | ICD-10-CM | POA: Diagnosis not present

## 2019-10-14 DIAGNOSIS — R0781 Pleurodynia: Secondary | ICD-10-CM

## 2019-10-14 DIAGNOSIS — S301XXA Contusion of abdominal wall, initial encounter: Secondary | ICD-10-CM | POA: Diagnosis not present

## 2019-10-14 DIAGNOSIS — M79602 Pain in left arm: Secondary | ICD-10-CM | POA: Diagnosis not present

## 2019-10-14 DIAGNOSIS — I10 Essential (primary) hypertension: Secondary | ICD-10-CM | POA: Diagnosis not present

## 2019-10-14 DIAGNOSIS — S2232XA Fracture of one rib, left side, initial encounter for closed fracture: Secondary | ICD-10-CM | POA: Diagnosis not present

## 2019-10-14 DIAGNOSIS — R82998 Other abnormal findings in urine: Secondary | ICD-10-CM | POA: Diagnosis not present

## 2019-10-14 DIAGNOSIS — E1140 Type 2 diabetes mellitus with diabetic neuropathy, unspecified: Secondary | ICD-10-CM | POA: Diagnosis not present

## 2019-10-14 DIAGNOSIS — Y998 Other external cause status: Secondary | ICD-10-CM | POA: Diagnosis not present

## 2019-10-14 DIAGNOSIS — Y92410 Unspecified street and highway as the place of occurrence of the external cause: Secondary | ICD-10-CM | POA: Diagnosis not present

## 2019-10-14 DIAGNOSIS — E1165 Type 2 diabetes mellitus with hyperglycemia: Secondary | ICD-10-CM | POA: Diagnosis not present

## 2019-10-14 DIAGNOSIS — E039 Hypothyroidism, unspecified: Secondary | ICD-10-CM | POA: Diagnosis not present

## 2019-10-14 DIAGNOSIS — E785 Hyperlipidemia, unspecified: Secondary | ICD-10-CM | POA: Diagnosis not present

## 2019-10-14 DIAGNOSIS — S3991XA Unspecified injury of abdomen, initial encounter: Secondary | ICD-10-CM | POA: Diagnosis not present

## 2019-10-14 DIAGNOSIS — R319 Hematuria, unspecified: Secondary | ICD-10-CM | POA: Diagnosis not present

## 2019-10-14 DIAGNOSIS — I700 Atherosclerosis of aorta: Secondary | ICD-10-CM | POA: Diagnosis not present

## 2019-10-14 DIAGNOSIS — J9809 Other diseases of bronchus, not elsewhere classified: Secondary | ICD-10-CM | POA: Diagnosis not present

## 2019-10-14 DIAGNOSIS — K3189 Other diseases of stomach and duodenum: Secondary | ICD-10-CM | POA: Diagnosis not present

## 2019-10-14 NOTE — Telephone Encounter (Signed)
New message:   Pt is calling to see if his results for his x-rays are complete yet. He states depending on these results he may need to call his cardiologist. Please advise.

## 2019-10-14 NOTE — Assessment & Plan Note (Signed)
?   Blood in urine, for UA, may need to temporarily hold eliquis

## 2019-10-14 NOTE — Assessment & Plan Note (Signed)
stable overall by history and exam, recent data reviewed with pt, and pt to continue medical treatment as before,  to f/u any worsening symptoms or concerns  

## 2019-10-14 NOTE — Assessment & Plan Note (Signed)
With marked bruising, for tylenol prn
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
## 2019-10-14 NOTE — Patient Instructions (Addendum)
Please continue all other medications as before, and refills have been done if requested.  Please have the pharmacy call with any other refills you may need.  Please continue your efforts at being more active, low cholesterol diet, and weight control.  You are otherwise up to date with prevention measures today.  Please keep your appointments with your specialists as you may have planned  Please go to the XRAY Department in the first floor for the x-ray testing  Please go to the LAB at the blood drawing area for the tests to be done; if the a1c is normal we may be able to cut back further on the metformin  You will be contacted by phone if any changes need to be made immediately.  Otherwise, you will receive a letter about your results with an explanation, but please check with MyChart first.  Please remember to sign up for MyChart if you have not done so, as this will be important to you in the future with finding out test results, communicating by private email, and scheduling acute appointments online when needed.  Please make an Appointment to return in 6 months, or sooner if needed

## 2019-10-14 NOTE — Assessment & Plan Note (Addendum)
Post fall, for xray r/o fx  I spent 41 minutes in preparing to see the patient by review of recent labs, imaging and procedures, obtaining and reviewing separately obtained history, communicating with the patient and family or caregiver, ordering medications, tests or procedures, and documenting clinical information in the EHR including the differential Dx, treatment, and any further evaluation and other management of rib pain, left arm pain, hld, dm, htn, dark urine

## 2019-10-15 DIAGNOSIS — S3991XA Unspecified injury of abdomen, initial encounter: Secondary | ICD-10-CM | POA: Diagnosis not present

## 2019-10-15 DIAGNOSIS — K3189 Other diseases of stomach and duodenum: Secondary | ICD-10-CM | POA: Diagnosis not present

## 2019-10-15 DIAGNOSIS — I700 Atherosclerosis of aorta: Secondary | ICD-10-CM | POA: Diagnosis not present

## 2019-10-15 DIAGNOSIS — J479 Bronchiectasis, uncomplicated: Secondary | ICD-10-CM | POA: Diagnosis not present

## 2019-10-15 DIAGNOSIS — J9809 Other diseases of bronchus, not elsewhere classified: Secondary | ICD-10-CM | POA: Diagnosis not present

## 2019-10-15 DIAGNOSIS — S2232XA Fracture of one rib, left side, initial encounter for closed fracture: Secondary | ICD-10-CM | POA: Diagnosis not present

## 2019-10-15 DIAGNOSIS — D7389 Other diseases of spleen: Secondary | ICD-10-CM | POA: Diagnosis not present

## 2019-10-15 LAB — CBC WITH DIFFERENTIAL/PLATELET
Absolute Monocytes: 410 cells/uL (ref 200–950)
Basophils Absolute: 38 cells/uL (ref 0–200)
Basophils Relative: 0.6 %
Eosinophils Absolute: 63 cells/uL (ref 15–500)
Eosinophils Relative: 1 %
HCT: 40.7 % (ref 38.5–50.0)
Hemoglobin: 14 g/dL (ref 13.2–17.1)
Lymphs Abs: 1027 cells/uL (ref 850–3900)
MCH: 31.5 pg (ref 27.0–33.0)
MCHC: 34.4 g/dL (ref 32.0–36.0)
MCV: 91.5 fL (ref 80.0–100.0)
MPV: 9.4 fL (ref 7.5–12.5)
Monocytes Relative: 6.5 %
Neutro Abs: 4763 cells/uL (ref 1500–7800)
Neutrophils Relative %: 75.6 %
Platelets: 345 10*3/uL (ref 140–400)
RBC: 4.45 10*6/uL (ref 4.20–5.80)
RDW: 13.2 % (ref 11.0–15.0)
Total Lymphocyte: 16.3 %
WBC: 6.3 10*3/uL (ref 3.8–10.8)

## 2019-10-15 LAB — COMPLETE METABOLIC PANEL WITH GFR
AG Ratio: 1.6 (calc) (ref 1.0–2.5)
ALT: 28 U/L (ref 9–46)
AST: 29 U/L (ref 10–35)
Albumin: 3.9 g/dL (ref 3.6–5.1)
Alkaline phosphatase (APISO): 81 U/L (ref 35–144)
BUN/Creatinine Ratio: 15 (calc) (ref 6–22)
BUN: 22 mg/dL (ref 7–25)
CO2: 24 mmol/L (ref 20–32)
Calcium: 9.5 mg/dL (ref 8.6–10.3)
Chloride: 106 mmol/L (ref 98–110)
Creat: 1.43 mg/dL — ABNORMAL HIGH (ref 0.70–1.11)
GFR, Est African American: 53 mL/min/{1.73_m2} — ABNORMAL LOW (ref >=60)
GFR, Est Non African American: 46 mL/min/{1.73_m2} — ABNORMAL LOW (ref >=60)
Globulin: 2.5 g/dL (calc) (ref 1.9–3.7)
Glucose, Bld: 89 mg/dL (ref 65–99)
Potassium: 4.8 mmol/L (ref 3.5–5.3)
Sodium: 142 mmol/L (ref 135–146)
Total Bilirubin: 1 mg/dL (ref 0.2–1.2)
Total Protein: 6.4 g/dL (ref 6.1–8.1)

## 2019-10-15 LAB — HEMOGLOBIN A1C
Hgb A1c MFr Bld: 5.6 % of total Hgb (ref <5.7)
Mean Plasma Glucose: 114 (calc)
eAG (mmol/L): 6.3 (calc)

## 2019-10-15 LAB — URINALYSIS, ROUTINE W REFLEX MICROSCOPIC
Bacteria, UA: NONE SEEN /HPF
Bilirubin Urine: NEGATIVE
Glucose, UA: NEGATIVE
Hyaline Cast: NONE SEEN /LPF
Ketones, ur: NEGATIVE
Nitrite: NEGATIVE
Specific Gravity, Urine: 1.015 (ref 1.001–1.03)
Squamous Epithelial / HPF: NONE SEEN /HPF (ref <=5)
pH: <=5 (ref 5.0–8.0)

## 2019-10-17 NOTE — Telephone Encounter (Signed)
LDVM informing pt of Dr. Gwynn Burly note and also informing him that if he has any concerns and or questions to please call the office back.

## 2019-10-18 ENCOUNTER — Encounter: Payer: Self-pay | Admitting: Internal Medicine

## 2019-10-18 ENCOUNTER — Ambulatory Visit (INDEPENDENT_AMBULATORY_CARE_PROVIDER_SITE_OTHER): Payer: Medicare PPO | Admitting: Internal Medicine

## 2019-10-18 ENCOUNTER — Telehealth: Payer: Self-pay | Admitting: Internal Medicine

## 2019-10-18 DIAGNOSIS — S2232XA Fracture of one rib, left side, initial encounter for closed fracture: Secondary | ICD-10-CM | POA: Insufficient documentation

## 2019-10-18 DIAGNOSIS — S2232XD Fracture of one rib, left side, subsequent encounter for fracture with routine healing: Secondary | ICD-10-CM | POA: Diagnosis not present

## 2019-10-18 DIAGNOSIS — I10 Essential (primary) hypertension: Secondary | ICD-10-CM | POA: Diagnosis not present

## 2019-10-18 DIAGNOSIS — E1140 Type 2 diabetes mellitus with diabetic neuropathy, unspecified: Secondary | ICD-10-CM

## 2019-10-18 DIAGNOSIS — R3129 Other microscopic hematuria: Secondary | ICD-10-CM

## 2019-10-18 MED ORDER — LEVOTHYROXINE SODIUM 112 MCG PO TABS: 112.0000 ug | ORAL_TABLET | Freq: Every day | ORAL | 3 refills | Status: DC

## 2019-10-18 MED ORDER — LOSARTAN POTASSIUM 25 MG PO TABS
25.0000 mg | ORAL_TABLET | Freq: Every day | ORAL | 3 refills | Status: DC
Start: 2019-10-18 — End: 2020-02-07

## 2019-10-18 MED ORDER — APIXABAN 2.5 MG PO TABS
2.5000 mg | ORAL_TABLET | Freq: Two times a day (BID) | ORAL | 3 refills | Status: DC
Start: 2019-10-18 — End: 2020-01-12
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
## 2019-10-18 NOTE — Telephone Encounter (Signed)
New message:   Pt would like a call back in reference to his urine test he had this morning. Please advise.

## 2019-10-19 ENCOUNTER — Encounter: Payer: Self-pay | Admitting: Internal Medicine

## 2019-10-19 LAB — URINALYSIS, ROUTINE W REFLEX MICROSCOPIC
Bacteria, UA: NONE SEEN /HPF
Bilirubin Urine: NEGATIVE
Glucose, UA: NEGATIVE
Hyaline Cast: NONE SEEN /LPF
Ketones, ur: NEGATIVE
Leukocytes,Ua: NEGATIVE
Nitrite: NEGATIVE
Protein, ur: NEGATIVE
Specific Gravity, Urine: 1.015 (ref 1.001–1.03)
Squamous Epithelial / HPF: NONE SEEN /HPF (ref <=5)
WBC, UA: NONE SEEN /HPF (ref 0–5)
pH: 5.5 (ref 5.0–8.0)

## 2019-10-19 NOTE — Telephone Encounter (Signed)
LDVM for pt of Dr. Gwynn Burly note and of the instructions he has stated for pt to restart Eliquis as they have discussed.

## 2019-10-20 ENCOUNTER — Other Ambulatory Visit: Payer: Self-pay | Admitting: Internal Medicine

## 2019-10-20 ENCOUNTER — Encounter: Payer: Self-pay | Admitting: Internal Medicine

## 2019-10-20 MED ORDER — CEPHALEXIN 500 MG PO CAPS: 500.0000 mg | ORAL_CAPSULE | Freq: Three times a day (TID) | ORAL | 0 refills | Status: AC

## 2019-10-23 ENCOUNTER — Encounter: Payer: Self-pay | Admitting: Internal Medicine
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
## 2019-10-23 NOTE — Assessment & Plan Note (Signed)
Stable to improved, cont same tx

## 2019-10-23 NOTE — Assessment & Plan Note (Signed)
stable overall by history and exam, recent data reviewed with pt, and pt to continue medical treatment as before,  to f/u any worsening symptoms or concerns Lab Results  Component Value Date   HGBA1C 5.6 10/14/2019

## 2019-10-23 NOTE — Assessment & Plan Note (Signed)
stable overall by history and exam, recent data reviewed with pt, and pt to continue medical treatment as before,  to f/u any worsening symptoms or concerns  

## 2019-10-27 ENCOUNTER — Ambulatory Visit: Payer: Self-pay

## 2019-11-01 DIAGNOSIS — R0600 Dyspnea, unspecified: Secondary | ICD-10-CM | POA: Diagnosis not present

## 2019-11-01 DIAGNOSIS — G4733 Obstructive sleep apnea (adult) (pediatric): Secondary | ICD-10-CM | POA: Diagnosis not present

## 2019-11-01 DIAGNOSIS — Z9989 Dependence on other enabling machines and devices: Secondary | ICD-10-CM | POA: Diagnosis not present

## 2019-11-02 ENCOUNTER — Ambulatory Visit (INDEPENDENT_AMBULATORY_CARE_PROVIDER_SITE_OTHER): Payer: Medicare PPO

## 2019-11-02 DIAGNOSIS — Z0000 Encounter for general adult medical examination without abnormal findings: Secondary | ICD-10-CM | POA: Diagnosis not present

## 2019-11-02 NOTE — Patient Instructions (Addendum)
[name] , Thank you for taking time to come for your Medicare Wellness Visit. I appreciate your ongoing commitment to your health goals. Please review the following plan we discussed and let me know if I can assist you in the future.   Screening recommendations/referrals: Colonoscopy: 06/04/2017; due every 3 years (Cologuard) Recommended yearly ophthalmology/optometry visit for glaucoma screening and checkup Recommended yearly dental visit for hygiene and checkup  Vaccinations: Influenza vaccine: 12/13/2018 Pneumococcal vaccine: completed Tdap vaccine: overdue Shingles vaccine: never done   Covid-19: completed  Advanced directives: Please bring a copy of your health care power of attorney and living will to the office at your convenience.  Conditions/risks identified: Yes; Please continue to do your personal lifestyle choices by: daily care of teeth and gums, regular physical activity (goal should be 5 days a week for 30 minutes), eat a healthy diet, avoid tobacco and drug use, limiting any alcohol intake, taking a low-dose aspirin (if not allergic or have been advised by your provider otherwise) and taking vitamins and minerals as recommended by your provider. Continue doing brain stimulating activities (puzzles, reading, adult coloring books, staying active) to keep memory sharp. Continue to eat heart healthy diet (full of fruits, vegetables, whole grains, lean protein, water--limit salt, fat, and sugar intake) and increase physical activity as tolerated.  Next appointment: Please schedule your next Medicare Wellness Visit with your Nurse Health Advisor in 1 year.  Preventive Care 81 Years and Older, Male Preventive care refers to lifestyle choices and visits with your health care provider that can promote health and wellness. What does preventive care include?  A yearly physical exam. This is also called an annual well check.  Dental exams once or twice a year.  Routine eye exams. Ask  your health care provider how often you should have your eyes checked.  Personal lifestyle choices, including:  Daily care of your teeth and gums.  Regular physical activity.  Eating a healthy diet.  Avoiding tobacco and drug use.  Limiting alcohol use.  Practicing safe sex.  Taking low doses of aspirin every day.  Taking vitamin and mineral supplements as recommended by your health care provider. What happens during an annual well check? The services and screenings done by your health care provider during your annual well check will depend on your age, overall health, lifestyle risk factors, and family history of disease. Counseling  Your health care provider may ask you questions about your:  Alcohol use.  Tobacco use.  Drug use.  Emotional well-being.  Home and relationship well-being.  Sexual activity.  Eating habits.  History of falls.  Memory and ability to understand (cognition).  Work and work Statistician. Screening  You may have the following tests or measurements:  Height, weight, and BMI.  Blood pressure.  Lipid and cholesterol levels. These may be checked every 5 years, or more frequently if you are over 26 years old.  Skin check.  Lung cancer screening. You may have this screening every year starting at age 57 if you have a 30-pack-year history of smoking and currently smoke or have quit within the past 15 years.  Fecal occult blood test (FOBT) of the stool. You may have this test every year starting at age 50.  Flexible sigmoidoscopy or colonoscopy. You may have a sigmoidoscopy every 5 years or a colonoscopy every 10 years starting at age 58.  Prostate cancer screening. Recommendations will vary depending on your family history and other risks.  Hepatitis C blood test.  Hepatitis B blood test.  Sexually transmitted disease (STD) testing.  Diabetes screening. This is done by checking your blood sugar (glucose) after you have not eaten  for a while (fasting). You may have this done every 1-3 years.  Abdominal aortic aneurysm (AAA) screening. You may need this if you are a current or former smoker.  Osteoporosis. You may be screened starting at age 95 if you are at high risk. Talk with your health care provider about your test results, treatment options, and if necessary, the need for more tests. Vaccines  Your health care provider may recommend certain vaccines, such as:  Influenza vaccine. This is recommended every year.  Tetanus, diphtheria, and acellular pertussis (Tdap, Td) vaccine. You may need a Td booster every 10 years.  Zoster vaccine. You may need this after age 33.  Pneumococcal 13-valent conjugate (PCV13) vaccine. One dose is recommended after age 53.  Pneumococcal polysaccharide (PPSV23) vaccine. One dose is recommended after age 85. Talk to your health care provider about which screenings and vaccines you need and how often you need them. This information is not intended to replace advice given to you by your health care provider. Make sure you discuss any questions you have with your health care provider. Document Released: 03/23/2015 Document Revised: 11/14/2015 Document Reviewed: 12/26/2014 Elsevier Interactive Patient Education  2017 [employer] Prevention in the Home Falls can cause injuries. They can happen to people of all ages. There are many things you can do to make your home safe and to help prevent falls. What can I do on the outside of my home?  Regularly fix the edges of walkways and driveways and fix any cracks.  Remove anything that might make you trip as you walk through a door, such as a raised step or threshold.  Trim any bushes or trees on the path to your home.  Use bright outdoor lighting.  Clear any walking paths of anything that might make someone trip, such as rocks or tools.  Regularly check to see if handrails are loose or broken. Make sure that both sides of any  steps have handrails.  Any raised decks and porches should have guardrails on the edges.  Have any leaves, snow, or ice cleared regularly.  Use sand or salt on walking paths during winter.  Clean up any spills in your garage right away. This includes oil or grease spills. What can I do in the bathroom?  Use night lights.  Install grab bars by the toilet and in the tub and shower. Do not use towel bars as grab bars.  Use non-skid mats or decals in the tub or shower.  If you need to sit down in the shower, use a plastic, non-slip stool.  Keep the floor dry. Clean up any water that spills on the floor as soon as it happens.  Remove soap buildup in the tub or shower regularly.  Attach bath mats securely with double-sided non-slip rug tape.  Do not have throw rugs and other things on the floor that can make you trip. What can I do in the bedroom?  Use night lights.  Make sure that you have a light by your bed that is easy to reach.  Do not use any sheets or blankets that are too big for your bed. They should not hang down onto the floor.  Have a firm chair that has side arms. You can use this for support while you get dressed.  Do not  have throw rugs and other things on the floor that can make you trip. What can I do in the kitchen?  Clean up any spills right away.  Avoid walking on wet floors.  Keep items that you use a lot in easy-to-reach places.  If you need to reach something above you, use a strong step stool that has a grab bar.  Keep electrical cords out of the way.  Do not use floor polish or wax that makes floors slippery. If you must use wax, use non-skid floor wax.  Do not have throw rugs and other things on the floor that can make you trip. What can I do with my stairs?  Do not leave any items on the stairs.  Make sure that there are handrails on both sides of the stairs and use them. Fix handrails that are broken or loose. Make sure that handrails are  as long as the stairways.  Check any carpeting to make sure that it is firmly attached to the stairs. Fix any carpet that is loose or worn.  Avoid having throw rugs at the top or bottom of the stairs. If you do have throw rugs, attach them to the floor with carpet tape.  Make sure that you have a light switch at the top of the stairs and the bottom of the stairs. If you do not have them, ask someone to add them for you. What else can I do to help prevent falls?  Wear shoes that:  Do not have high heels.  Have rubber bottoms.  Are comfortable and fit you well.  Are closed at the toe. Do not wear sandals.  If you use a stepladder:  Make sure that it is fully opened. Do not climb a closed stepladder.  Make sure that both sides of the stepladder are locked into place.  Ask someone to hold it for you, if possible.  Clearly mark and make sure that you can see:  Any grab bars or handrails.  First and last steps.  Where the edge of each step is.  Use tools that help you move around (mobility aids) if they are needed. These include:  Canes.  Walkers.  Scooters.  Crutches.  Turn on the lights when you go into a dark area. Replace any light bulbs as soon as they burn out.  Set up your furniture so you have a clear path. Avoid moving your furniture around.  If any of your floors are uneven, fix them.  If there are any pets around you, be aware of where they are.  Review your medicines with your doctor. Some medicines can make you feel dizzy. This can increase your chance of falling. Ask your doctor what other things that you can do to help prevent falls. This information is not intended to replace advice given to you by your health care provider. Make sure you discuss any questions you have with your health care provider. Document Released: 12/21/2008 Document Revised: 08/02/2015 Document Reviewed: 03/31/2014 Elsevier Interactive Patient Education  2017 [employer].

## 2019-11-02 NOTE — Progress Notes (Signed)
I connected with Nathan Russo today by telephone and verified that I am speaking with the correct person using two identifiers. Location patient: home Location provider: work Persons participating in the virtual visit: Nathan Russo, Nathan Russo (wife) and Nathan Ser. Tajuan Dufault, LPN   I discussed the limitations, risks, security and privacy concerns of performing an evaluation and management service by telephone and the availability of in person appointments. I also discussed with the patient that there may be a patient responsible charge related to this service. The patient expressed understanding and verbally consented to this telephonic visit.    Interactive audio and video telecommunications were attempted between this provider and patient, however failed, due to patient having technical difficulties OR patient did not have access to video capability.  We continued and completed visit with audio only.  Some vital signs may be absent or patient reported.   Time Spent with patient on telephone encounter: 30 minutes  Subjective:   Nathan Russo is a 79 y.o. male who presents for Medicare Annual/Subsequent preventive examination.  Review of Systems    No ROS. Medicare Wellness Visit. Cardiac Risk Factors include: advanced age (>25mn, >>57women);diabetes mellitus;dyslipidemia;family history of premature cardiovascular disease;hypertension;male gender     Objective:    There were no vitals filed for this visit. There is no height or weight on file to calculate BMI.  Advanced Directives 11/02/2019 10/19/2018 10/09/2017 05/21/2016 05/10/2015  Does Patient Have a Medical Advance Directive? Yes Yes Yes Yes Yes  Type of Advance Directive Living will;Healthcare Power of [place] -  Does patient want to make changes to medical advance directive? No - Patient declined - - - -  Copy  of HRodriguez Campin Chart? No - copy requested No - copy requested No - copy requested No - copy requested No - copy requested    Current Medications (verified) Outpatient Encounter Medications as of 11/02/2019  Medication Sig  . Accu-Chek Softclix Lancets lancets Use to check blood sugars twice a day  . amiodarone (PACERONE) 200 MG tablet Take by mouth.  .Marland Kitchenapixaban (ELIQUIS) 2.5 MG TABS tablet Take 1 tablet (2.5 mg total) by mouth 2 (two) times daily.  . Ascorbic Acid (VITAMIN C) 1000 MG tablet Take 1,000 mg by mouth daily.    .Marland Kitchenaugmented betamethasone dipropionate (DIPROLENE-AF) 0.05 % ointment betamethasone, augmented 0.05 % topical ointment  APPLY OINTMENT TOPICALLY TO EACH EAR AS NEEDED FOR ITCHING  . blood glucose meter kit and supplies KIT Dispense based on patient and insurance preference. Use up to four times daily as directed. (FOR ICD-9 250.00, 250.01).  . Blood Glucose Monitoring Suppl Supplies MISC Use 1 strip as directed three times daily E11.9  . Calcium 500-125 MG-UNIT TABS 1 tablet daily.  . Cinnamon 500 MG capsule Take 500 mg by mouth 2 (two) times daily.    . cyanocobalamin 1000 MCG tablet Take by mouth.  . ezetimibe (ZETIA) 10 MG tablet   . Flaxseed, Linseed, 1000 MG CAPS Take 1 capsule by mouth daily.  . Garlic Oil (ODORLESS GARLIC) 5979MG TABS Take by mouth 2 (two) times daily.    . Ginger, Zingiber officinalis, (GINGER PO) Take by mouth 4 (four) times daily.    . Glucosamine-Chondroit-Vit C-Mn (GLUCOSAMINE CHONDROITIN COMPLX) CAPS Take by mouth daily.    .Marland Kitchenglucose blood test strip Check glucose 2-3 times a day. E11.40  . isosorbide mononitrate (  IMDUR) 60 MG 24 hr tablet Take by mouth.  . levothyroxine (SYNTHROID) 112 MCG tablet Take 1 tablet (112 mcg total) by mouth daily.  [name] losartan (COZAAR) 25 MG tablet Take 1 tablet (25 mg total) by mouth daily.  . metFORMIN (GLUCOPHAGE-XR) 500 MG 24 hr tablet Take 1 by mouth twice per day  . metoprolol succinate  (TOPROL-XL) 25 MG 24 hr tablet   . Multiple Vitamin (MULTIVITAMIN) capsule Take 1 capsule by mouth daily.    . nitroGLYCERIN (NITROSTAT) 0.4 MG SL tablet   . Omega-3 Fatty Acids (FISH OIL) 1000 MG CAPS Take by mouth 2 (two) times daily.    . pantoprazole (PROTONIX) 40 MG tablet Take 2 tablets (80 mg total) by mouth daily.  . prasugrel (EFFIENT) 10 MG TABS tablet Take 10 mg by mouth daily.  [name] VITAMIN D, CHOLECALCIFEROL, PO Take by mouth daily.     No facility-administered encounter medications on file as of 11/02/2019.    Allergies (verified) Diphenoxylate-atropine, Other, Sulfa antibiotics, Ace inhibitors, Atenolol, Codeine, Levaquin [levofloxacin], Lovastatin, Morphine, Morphine and related, Statins, Sulfamethoxazole, Sulfasalazine, and Tramadol   History: Past Medical History:  Diagnosis Date  . Abdominal pain, epigastric 04/11/2010  . BELCHING 04/23/2010  . BRADYCARDIA, CHRONIC 10/24/2006  . CARPAL TUNNEL SYNDROME, BILATERAL 02/07/2009  . CHEST PAIN-UNSPECIFIED 09/05/2008  . CHOLELITHIASIS 04/22/2010  . Cholelithiasis 06/13/2010  . COLONIC POLYPS, HX OF 04/28/2007  . DEGENERATIVE JOINT DISEASE, RIGHT KNEE 10/24/2006  . Depression 02/23/2011  . DIABETES MELLITUS, TYPE II 04/28/2007  . Dizziness and giddiness 12/19/2009  . Elevated PSA 06/13/2010  . FATIGUE 04/28/2007  . GERD 02/11/2009  . Headache(784.0) 12/19/2009  . HYPERLIPIDEMIA 04/28/2007  . HYPERTENSION 10/21/2006  . NECK MASS 02/07/2010  . OBESITY 10/24/2006  . OTITIS MEDIA, ACUTE, LEFT 12/26/2009  . PHIMOSIS 02/07/2009  . S/P laparoscopic cholecystectomy 10/31/2010  . Thyroid cancer ([place]) 06/13/2010  . THYROID NODULE 02/07/2010   Past Surgical History:  Procedure Laterality Date  . CHOLECYSTECTOMY    . left knee surgery    . right wrist surgury    . THYROID SURGERY     Family History  Problem Relation Age of Onset  . Heart attack Brother 75  . Diabetes Brother   . Cancer Brother        colon  . Colon cancer Brother 63  .  Hypertension Mother   . Arthritis Mother   . Goiter Mother   . Heart attack Father 52  . Cancer Sister        breast  . Hypothyroidism Sister    Social History   Socioeconomic History  . Marital status: Married    Spouse name: Not on file  . Number of children: 3  . Years of education: Not on file  . Highest education level: Not on file  Occupational History  . Occupation: former Programmer, systems: RETIRED  Tobacco Use  . Smoking status: Former Research scientist (life sciences)  . Smokeless tobacco: Never Used  Vaping Use  . Vaping Use: Never used  Substance and Sexual Activity  . Alcohol use: No    Alcohol/week: 0.0 standard drinks  . Drug use: No  . Sexual activity: Not Currently  Other Topics Concern  . Not on file  Social History Narrative  . Not on file   Social Determinants of Health   Financial Resource Strain: Low Risk   . Difficulty of Paying Living Expenses: Not hard at all  Food Insecurity: No Food Insecurity  .  Worried About Charity fundraiser in the Last Year: Never true  . Ran Out of Food in the Last Year: Never true  Transportation Needs: No Transportation Needs  . Lack of Transportation (Medical): No  . Lack of Transportation (Non-Medical): No  Physical Activity: Sufficiently Active  . Days of Exercise per Week: 7 days  . Minutes of Exercise per Session: 60 min  Stress: No Stress Concern Present  . Feeling of Stress : Not at all  Social Connections: Socially Integrated  . Frequency of Communication with Friends and Family: More than three times a week  . Frequency of Social Gatherings with Friends and Family: More than three times a week  . Attends Religious Services: More than 4 times per year  . Active Member of Clubs or Organizations: Yes  . Attends Archivist Meetings: More than 4 times per year  . Marital Status: Married    Tobacco Counseling Counseling given: Not Answered   Clinical Intake:  Pre-visit preparation  completed: Yes  Pain : No/denies pain     Nutritional Risks: None Diabetes: Yes CBG done?: No Did pt. bring in CBG monitor from home?: No  How often do you need to have someone help you when you read instructions, pamphlets, or other written materials from your doctor or pharmacy?: 1 - Never What is the last grade level you completed in school?: [place]  Diabetic? yes  Interpreter Needed?: No  Information entered by :: Jaciel Diem N. Rose Hegner, LPN   Activities of Daily Living In your present state of health, do you have any difficulty performing the following activities: 11/02/2019  Hearing? N  Vision? N  Difficulty concentrating or making decisions? N  Walking or climbing stairs? N  Dressing or bathing? N  Doing errands, shopping? N  Preparing Food and eating ? N  Using the Toilet? N  In the past six months, have you accidently leaked urine? N  Do you have problems with loss of bowel control? N  Managing your Medications? N  Managing your Finances? N  Housekeeping or managing your Housekeeping? N  Some recent data might be hidden    Patient Care Team: Biagio Borg, MD as PCP - General Philomena Doheny, MD as Referring Physician (Plastic Surgery) Ebbie Ridge, MD as Referring Physician (Cardiology)  Indicate any recent Medical Services you may have received from other than [facility] providers in the past year (date may be approximate).     Assessment:   This is a routine wellness examination for Joss.  Hearing/Vision screen No exam data present  Dietary issues and exercise activities discussed: Current Exercise Habits: Home exercise routine, Time (Minutes): 60, Frequency (Times/Week): 7, Weekly Exercise (Minutes/Week): 420, Intensity: Moderate, Exercise limited by: cardiac condition(s)  Goals    .  Control my diabetic condition      Focus on eating low sugar and low carbohydrates, start to exercise    .  patient (pt-stated)      Wants to have left  knee replacement 1981; goes back April 20th Trying to use bone on bone;       [name]  Patient Stated      Stay as healthy and as independent as possible. Continue to eat healthy, exercise, enjoy life and family.    .  Patient Stated (pt-stated)      My goal is to try and make it to next year.      Depression Screen PHQ 2/9 Scores 11/02/2019 10/19/2018 05/13/2018 10/09/2017  05/08/2017 05/21/2016 05/06/2016  PHQ - 2 Score 0 0 0 0 0 0 0    Fall Risk Fall Risk  11/02/2019 05/10/2019 10/19/2018 05/13/2018 10/09/2017  Falls in the past year? 1 1 0 0 No  Number falls in past yr: 0 0 0 - -  Comment - - - - -  Injury with Fall? 1 1 - - -  Comment - - - - -  Risk for fall due to : History of fall(s) - - - -  Follow up Falls evaluation completed - - - -    Any stairs in or around the home? Yes  If so, are there any without handrails? No  Home free of loose throw rugs in walkways, pet beds, electrical cords, etc? Yes  Adequate lighting in your home to reduce risk of falls? Yes   ASSISTIVE DEVICES UTILIZED TO PREVENT FALLS:  Life alert? No  Use of a cane, walker or w/c? No  Grab bars in the bathroom? No  Shower chair or bench in shower? No  Elevated toilet seat or a handicapped toilet? No   TIMED UP AND GO:  Was the test performed? No .  Length of time to ambulate 10 feet: 0 sec.   Gait steady and fast without use of assistive device  Cognitive Function:        Immunizations Immunization History  Administered Date(s) Administered  . Fluad Quad(high Dose 65+) 12/13/2018  . Influenza Split 12/13/2010  . Influenza Whole 12/09/2007, 12/13/2009  . Influenza, High Dose Seasonal PF 12/10/2014, 11/04/2016, 11/10/2017  . Influenza, Seasonal, Injecte, Preservative Fre 12/08/2012  . Influenza,inj,Quad PF,6+ Mos 11/07/2015  . PFIZER SARS-COV-2 Vaccination 04/03/2019, 05/31/2019  . Pneumococcal Conjugate-13 03/22/2013  . Pneumococcal Polysaccharide-23 01/08/2006, 03/25/2011  . Td 05/17/2009     TDAP status: Due, Education has been provided regarding the importance of this vaccine. Advised may receive this vaccine at local pharmacy or Health Dept. Aware to provide a copy of the vaccination record if obtained from local pharmacy or Health Dept. Verbalized acceptance and understanding. Flu Vaccine status: Up to date Pneumococcal vaccine status: Up to date Covid-19 vaccine status: Completed vaccines  Qualifies for Shingles Vaccine? Yes   Zostavax completed Yes   Shingrix Completed?: No.    Education has been provided regarding the importance of this vaccine. Patient has been advised to call insurance company to determine out of pocket expense if they have not yet received this vaccine. Advised may also receive vaccine at local pharmacy or Health Dept. Verbalized acceptance and understanding.  Screening Tests Health Maintenance  Topic Date Due  . OPHTHALMOLOGY EXAM  05/26/2018  . TETANUS/TDAP  05/18/2019  . INFLUENZA VACCINE  10/09/2019  . FOOT EXAM  07/14/2020  . COVID-19 Vaccine  Completed  . PNA vac Low Risk Adult  Completed    Health Maintenance  Health Maintenance Due  Topic Date Due  . OPHTHALMOLOGY EXAM  05/26/2018  . TETANUS/TDAP  05/18/2019  . INFLUENZA VACCINE  10/09/2019    Colorectal cancer screening: Completed 08/04/2017. Repeat every 3 years  Lung Cancer Screening: (Low Dose CT Chest recommended if Age 89-80 years, 30 pack-year currently smoking OR have quit w/in 15years.) does not qualify.   Lung Cancer Screening Referral: no  Additional Screening:  Hepatitis C Screening: does not qualify; Completed no  Vision Screening: Recommended annual ophthalmology exams for early detection of glaucoma and other disorders of the eye. Is the patient up to date with their annual eye exam?  No  Who is the provider or what is the name of the office in which the patient attends annual eye exams? Per patient, looking for a new eye specialist; no referral needed. If pt  is not established with a provider, would they like to be referred to a provider to establish care? No .   Dental Screening: Recommended annual dental exams for proper oral hygiene  Community Resource Referral / Chronic Care Management: CRR required this visit?  No   CCM required this visit?  No      Plan:     I have personally reviewed and noted the following in the patient's chart:   . Medical and social history . Use of alcohol, tobacco or illicit drugs  . Current medications and supplements . Functional ability and status . Nutritional status . Physical activity . Advanced directives . List of other physicians . Hospitalizations, surgeries, and ER visits in previous 12 months . Vitals . Screenings to include cognitive, depression, and falls . Referrals and appointments  In addition, I have reviewed and discussed with patient certain preventive protocols, quality metrics, and best practice recommendations. A written personalized care plan for preventive services as well as general preventive health recommendations were provided to patient.     Sheral Flow, LPN   6/68/1594   Nurse Notes:  Patient is cogitatively intact. There were no vitals filed for this visit. There is no height or weight on file to calculate BMI. Patient stated that he has no issues with gait or balance; does not use any assistive devices.

## 2019-11-15 DIAGNOSIS — L89301 Pressure ulcer of unspecified buttock, stage 1: Secondary | ICD-10-CM | POA: Diagnosis not present

## 2019-11-21 ENCOUNTER — Telehealth: Payer: Self-pay | Admitting: Internal Medicine

## 2019-11-21 DIAGNOSIS — Z20828 Contact with and (suspected) exposure to other viral communicable diseases: Secondary | ICD-10-CM | POA: Diagnosis not present

## 2019-11-21 DIAGNOSIS — R05 Cough: Secondary | ICD-10-CM | POA: Diagnosis not present

## 2019-11-21 NOTE — Telephone Encounter (Signed)
Patient called and stated he is + for COVID and they advised him he should do the infusion. Patient wants to know what Dr.John thinks of this.  Please follow up with patient. Thank you.

## 2019-11-21 NOTE — Telephone Encounter (Signed)
Yes this would be an excellent idea as it is safe and markedly reduces the risk of getting worse and being hospitalized, and is the same treatment that president trump received over a year ago.

## 2019-11-21 NOTE — Telephone Encounter (Signed)
Sent to Dr. John to advise. 

## 2019-11-24 ENCOUNTER — Telehealth: Payer: Self-pay | Admitting: Internal Medicine

## 2019-11-24 NOTE — Telephone Encounter (Signed)
Agree to ED.

## 2019-11-24 NOTE — Telephone Encounter (Signed)
No need for antibody testing as a pos or neg test will not change the need for treatment with the treatment mentioned in this thread

## 2019-11-24 NOTE — Telephone Encounter (Signed)
Team Health Report/Call : ---Caller states was diagnosed with Covid on 9/13 and has chest burning. Trying to do deep breathing exercises and feels like the top of his lungs is burning. Seems to be staying there and started this morning. Doesn't radiate. Doesn't hurt to talk or breath. Office has been trying to get him an infusion of antibodies  Advised go to ED now.  (I do not see patient has went to ED.)

## 2019-11-24 NOTE — Telephone Encounter (Signed)
Message sent to Infusion team. They will call patient and get him set up.

## 2019-11-24 NOTE — Telephone Encounter (Addendum)
° °  Patient requesting order for antibody testing Patient called EMS on 9/15, he states he felt some chest discomfort. He did not go to hospital, his vitals were stable. Patient referred to [facility], he is concerned about developing pneumonia

## 2019-11-25 DIAGNOSIS — U071 COVID-19: Secondary | ICD-10-CM | POA: Diagnosis not present

## 2019-12-14 DIAGNOSIS — E890 Postprocedural hypothyroidism: Secondary | ICD-10-CM | POA: Diagnosis not present

## 2019-12-14 DIAGNOSIS — R918 Other nonspecific abnormal finding of lung field: Secondary | ICD-10-CM | POA: Diagnosis not present

## 2019-12-14 DIAGNOSIS — C73 Malignant neoplasm of thyroid gland: Secondary | ICD-10-CM | POA: Diagnosis not present

## 2020-01-04 DIAGNOSIS — I2102 ST elevation (STEMI) myocardial infarction involving left anterior descending coronary artery: Secondary | ICD-10-CM | POA: Diagnosis not present

## 2020-01-04 DIAGNOSIS — I4891 Unspecified atrial fibrillation: Secondary | ICD-10-CM | POA: Diagnosis not present

## 2020-01-04 DIAGNOSIS — K219 Gastro-esophageal reflux disease without esophagitis: Secondary | ICD-10-CM | POA: Diagnosis not present

## 2020-01-04 DIAGNOSIS — G4733 Obstructive sleep apnea (adult) (pediatric): Secondary | ICD-10-CM | POA: Diagnosis not present

## 2020-01-04 DIAGNOSIS — I499 Cardiac arrhythmia, unspecified: Secondary | ICD-10-CM | POA: Diagnosis not present

## 2020-01-04 DIAGNOSIS — I10 Essential (primary) hypertension: Secondary | ICD-10-CM | POA: Diagnosis not present

## 2020-01-04 DIAGNOSIS — I480 Paroxysmal atrial fibrillation: Secondary | ICD-10-CM | POA: Diagnosis not present

## 2020-01-04 DIAGNOSIS — E0800 Diabetes mellitus due to underlying condition with hyperosmolarity without nonketotic hyperglycemic-hyperosmolar coma (NKHHC): Secondary | ICD-10-CM | POA: Diagnosis not present

## 2020-01-04 DIAGNOSIS — I255 Ischemic cardiomyopathy: Secondary | ICD-10-CM | POA: Diagnosis not present

## 2020-01-10 DIAGNOSIS — H2513 Age-related nuclear cataract, bilateral: Secondary | ICD-10-CM | POA: Diagnosis not present

## 2020-01-10 DIAGNOSIS — H04123 Dry eye syndrome of bilateral lacrimal glands: Secondary | ICD-10-CM | POA: Diagnosis not present

## 2020-01-11 DIAGNOSIS — L570 Actinic keratosis: Secondary | ICD-10-CM | POA: Diagnosis not present

## 2020-01-11 DIAGNOSIS — D224 Melanocytic nevi of scalp and neck: Secondary | ICD-10-CM | POA: Diagnosis not present

## 2020-01-11 DIAGNOSIS — Z85828 Personal history of other malignant neoplasm of skin: Secondary | ICD-10-CM | POA: Diagnosis not present

## 2020-01-11 DIAGNOSIS — L821 Other seborrheic keratosis: Secondary | ICD-10-CM | POA: Diagnosis not present

## 2020-01-11 DIAGNOSIS — L89321 Pressure ulcer of left buttock, stage 1: Secondary | ICD-10-CM | POA: Diagnosis not present

## 2020-01-11 DIAGNOSIS — B353 Tinea pedis: Secondary | ICD-10-CM | POA: Diagnosis not present

## 2020-01-12 ENCOUNTER — Telehealth: Payer: Self-pay | Admitting: Internal Medicine

## 2020-01-12 MED ORDER — APIXABAN 2.5 MG PO TABS: 2.5000 mg | ORAL_TABLET | Freq: Two times a day (BID) | ORAL | 3 refills | Status: DC

## 2020-01-12 NOTE — Telephone Encounter (Signed)
°  Patient calling the office for samples of medication: ° ° °1.  What medication and dosage are you requesting samples for? °Eliquis  °2.  Are you currently out of this medication?  °Yes  ° ° °

## 2020-01-12 NOTE — Telephone Encounter (Signed)
Left vm for pt stating that I sent in a script for Eliquis to his pharmacy.

## 2020-01-26 DIAGNOSIS — I10 Essential (primary) hypertension: Secondary | ICD-10-CM | POA: Diagnosis not present

## 2020-01-26 DIAGNOSIS — Z043 Encounter for examination and observation following other accident: Secondary | ICD-10-CM | POA: Diagnosis not present

## 2020-01-26 DIAGNOSIS — W19XXXA Unspecified fall, initial encounter: Secondary | ICD-10-CM | POA: Diagnosis not present

## 2020-01-26 DIAGNOSIS — G4489 Other headache syndrome: Secondary | ICD-10-CM | POA: Diagnosis not present

## 2020-01-26 DIAGNOSIS — R519 Headache, unspecified: Secondary | ICD-10-CM | POA: Diagnosis not present

## 2020-01-26 DIAGNOSIS — S0990XA Unspecified injury of head, initial encounter: Secondary | ICD-10-CM | POA: Diagnosis not present

## 2020-02-01 DIAGNOSIS — J324 Chronic pansinusitis: Secondary | ICD-10-CM | POA: Diagnosis not present

## 2020-02-07 ENCOUNTER — Ambulatory Visit (INDEPENDENT_AMBULATORY_CARE_PROVIDER_SITE_OTHER): Payer: Medicare PPO | Admitting: Internal Medicine

## 2020-02-07 ENCOUNTER — Encounter: Payer: Self-pay | Admitting: Internal Medicine

## 2020-02-07 ENCOUNTER — Other Ambulatory Visit: Payer: Self-pay

## 2020-02-07 VITALS — BP 130/80 | HR 55 | Temp 97.9°F | Ht 72.0 in | Wt 189.0 lb

## 2020-02-07 DIAGNOSIS — E1140 Type 2 diabetes mellitus with diabetic neuropathy, unspecified: Secondary | ICD-10-CM | POA: Diagnosis not present

## 2020-02-07 DIAGNOSIS — I10 Essential (primary) hypertension: Secondary | ICD-10-CM

## 2020-02-07 DIAGNOSIS — Z23 Encounter for immunization: Secondary | ICD-10-CM | POA: Diagnosis not present

## 2020-02-07 DIAGNOSIS — E785 Hyperlipidemia, unspecified: Secondary | ICD-10-CM | POA: Diagnosis not present

## 2020-02-07 NOTE — Progress Notes (Signed)
Subjective:    Patient ID: Nathan Russo, male    DOB: 1940/09/08, 79 y.o.   MRN: [id number]  HPI  Here to f/u; overall doing ok,  Pt denies chest pain, increasing sob or doe, wheezing, orthopnea, PND, increased LE swelling, palpitations, dizziness or syncope.  Pt denies new neurological symptoms such as new headache, or facial or extremity weakness or numbness.  Pt denies polydipsia, polyuria, or low sugar episode.  Pt states overall good compliance with meds, mostly trying to follow appropriate diet, with wt overall stable,  but little exercise however. Doing well now finished antibx per UC.   Wt Readings from Last 3 Encounters:  02/07/20 189 lb (85.7 kg)  10/18/19 198 lb (89.8 kg)  10/14/19 198 lb (89.8 kg)   Past Medical History:  Diagnosis Date  . Abdominal pain, epigastric 04/11/2010  . BELCHING 04/23/2010  . BRADYCARDIA, CHRONIC 10/24/2006  . CARPAL TUNNEL SYNDROME, BILATERAL 02/07/2009  . CHEST PAIN-UNSPECIFIED 09/05/2008  . CHOLELITHIASIS 04/22/2010  . Cholelithiasis 06/13/2010  . COLONIC POLYPS, HX OF 04/28/2007  . DEGENERATIVE JOINT DISEASE, RIGHT KNEE 10/24/2006  . Depression 02/23/2011  . DIABETES MELLITUS, TYPE II 04/28/2007  . Dizziness and giddiness 12/19/2009  . Elevated PSA 06/13/2010  . FATIGUE 04/28/2007  . GERD 02/11/2009  . Headache(784.0) 12/19/2009  . HYPERLIPIDEMIA 04/28/2007  . HYPERTENSION 10/21/2006  . NECK MASS 02/07/2010  . OBESITY 10/24/2006  . OTITIS MEDIA, ACUTE, LEFT 12/26/2009  . PHIMOSIS 02/07/2009  . S/P laparoscopic cholecystectomy 10/31/2010  . Thyroid cancer ([place]) 06/13/2010  . THYROID NODULE 02/07/2010   Past Surgical History:  Procedure Laterality Date  . CHOLECYSTECTOMY    . left knee surgery    . right wrist surgury    . THYROID SURGERY      reports that he has quit smoking. He has never used smokeless tobacco. He reports that he does not drink alcohol and does not use drugs. family history includes Arthritis in his mother; Cancer in his brother and  sister; Colon cancer (age of onset: 97) in his brother; Diabetes in his brother; Goiter in his mother; Heart attack (age of onset: 23) in his brother; Heart attack (age of onset: 25) in his father; Hypertension in his mother; Hypothyroidism in his sister. Allergies  Allergen Reactions  . Diphenoxylate-Atropine Other (See Comments)  . Other Other (See Comments) and Hives    Causes body to ache Causes body to ache  . Sulfa Antibiotics Other (See Comments)    As child almost died  . Ace Inhibitors     REACTION: cough  . Atenolol     REACTION: bradycardia  . Codeine Other (See Comments)    Head spins "wild"  . Levaquin [Levofloxacin]     Interferes with flecainide  . Lovastatin     REACTION: myalygros  . Morphine Nausea And Vomiting  . Morphine And Related Other (See Comments)  . Statins Other (See Comments)    Causes body to ache  . Sulfamethoxazole Other (See Comments)    Childhood unknown reaction  . Sulfasalazine Other (See Comments)    As child almost died  . Tramadol     Ants crawling all over   Current Outpatient Medications on File Prior to Visit  Medication Sig Dispense Refill  . Accu-Chek Softclix Lancets lancets Use to check blood sugars twice a day 100 each 5  . amiodarone (PACERONE) 200 MG tablet Take by mouth.    [name] apixaban (ELIQUIS) 2.5 MG TABS tablet Take 1 tablet (  2.5 mg total) by mouth 2 (two) times daily. 180 tablet 3  . Ascorbic Acid (VITAMIN C) 1000 MG tablet Take 1,000 mg by mouth daily.      [name] augmented betamethasone dipropionate (DIPROLENE-AF) 0.05 % ointment betamethasone, augmented 0.05 % topical ointment  APPLY OINTMENT TOPICALLY TO EACH EAR AS NEEDED FOR ITCHING    . blood glucose meter kit and supplies KIT Dispense based on patient and insurance preference. Use up to four times daily as directed. (FOR ICD-9 250.00, 250.01). 1 each 0  . Blood Glucose Monitoring Suppl Supplies MISC Use 1 strip as directed three times daily E11.9 300 each 3  . Calcium  Carb-Cholecalciferol 780 024 0415 MG-UNIT CAPS Take by mouth.    . Cinnamon 500 MG capsule Take 500 mg by mouth 2 (two) times daily.      . cyanocobalamin 1000 MCG tablet Take by mouth.    . doxycycline (VIBRA-TABS) 100 MG tablet     . ezetimibe (ZETIA) 10 MG tablet     . Flaxseed, Linseed, 1000 MG CAPS Take 1 capsule by mouth daily.    . Garlic Oil (ODORLESS GARLIC) 676 MG TABS Take by mouth 2 (two) times daily.      . Ginger, Zingiber officinalis, (GINGER PO) Take by mouth 4 (four) times daily.      . Glucosamine-Chondroit-Vit C-Mn (GLUCOSAMINE CHONDROITIN COMPLX) CAPS Take by mouth daily.      [name] glucose blood test strip Check glucose 2-3 times a day. E11.40 100 each 12  . isosorbide mononitrate (IMDUR) 60 MG 24 hr tablet Take by mouth.    . levothyroxine (SYNTHROID) 112 MCG tablet Take 1 tablet (112 mcg total) by mouth daily. 90 tablet 3  . losartan (COZAAR) 50 MG tablet     . metFORMIN (GLUCOPHAGE-XR) 500 MG 24 hr tablet Take 1 by mouth twice per day 180 tablet 3  . metoprolol succinate (TOPROL-XL) 25 MG 24 hr tablet     . Multiple Vitamin (MULTIVITAMIN) capsule Take 1 capsule by mouth daily.      . nitroGLYCERIN (NITROSTAT) 0.4 MG SL tablet     . Omega-3 Fatty Acids (FISH OIL) 1000 MG CAPS Take by mouth 2 (two) times daily.      . pantoprazole (PROTONIX) 40 MG tablet Take 2 tablets (80 mg total) by mouth daily. 90 tablet 3  . prasugrel (EFFIENT) 10 MG TABS tablet Take 10 mg by mouth daily.    . promethazine-dextromethorphan (PROMETHAZINE-DM) 6.25-15 MG/5ML syrup     . VITAMIN D, CHOLECALCIFEROL, PO Take by mouth daily.       No current facility-administered medications on file prior to visit.   Review of Systems All otherwise neg per pt    Objective:   Physical Exam BP 130/80 (BP Location: Left Arm, Patient Position: Sitting, Cuff Size: Large)   Pulse (!) 55   Temp 97.9 F (36.6 C) (Oral)   Ht 6' (1.829 m)   Wt 189 lb (85.7 kg)   SpO2 95%   BMI 25.63 kg/m  VS noted,   Constitutional: Pt appears in NAD HENT: Head: NCAT.  Right Ear: External ear normal.  Left Ear: External ear normal.  Eyes: . Pupils are equal, round, and reactive to light. Conjunctivae and EOM are normal Nose: without d/c or deformity Neck: Neck supple. Gross normal ROM Cardiovascular: Normal rate and regular rhythm.   Pulmonary/Chest: Effort normal and breath sounds without rales or wheezing.  Abd:  Soft, NT, ND, + BS, no organomegaly Neurological: Pt is  alert. At baseline orientation, motor grossly intact Skin: Skin is warm. No rashes, other new lesions, no LE edema Psychiatric: Pt behavior is normal without agitation  All otherwise neg per pt Lab Results  Component Value Date   WBC 6.3 10/14/2019   HGB 14.0 10/14/2019   HCT 40.7 10/14/2019   PLT 345 10/14/2019   GLUCOSE 89 10/14/2019   CHOL 128 05/10/2019   TRIG 137.0 05/10/2019   HDL 30.60 (L) 05/10/2019   LDLDIRECT 122.0 05/08/2017   LDLCALC 70 05/10/2019   ALT 28 10/14/2019   AST 29 10/14/2019   NA 142 10/14/2019   K 4.8 10/14/2019   CL 106 10/14/2019   CREATININE 1.43 (H) 10/14/2019   BUN 22 10/14/2019   CO2 24 10/14/2019   TSH 1.26 05/10/2019   PSA 3.59 05/08/2017   INR 1.0 08/22/2008   HGBA1C 5.6 10/14/2019   MICROALBUR 0.9 05/10/2019      Assessment & Plan:

## 2020-02-07 NOTE — Patient Instructions (Signed)
You had the Tdap and flu shots today  Please continue all other medications as before, and refills have been done if requested.  Please have the pharmacy call with any other refills you may need.  Please continue your efforts at being more active, low cholesterol diet, and weight control.  Please keep your appointments with your specialists as you may have planned  Please make an Appointment to return in 6 months, or sooner if needed

## 2020-02-12 ENCOUNTER — Encounter: Payer: Self-pay | Admitting: Internal Medicine

## 2020-02-12 NOTE — Assessment & Plan Note (Signed)
Lab Results  Component Value Date   LDLCALC 70 05/10/2019  stable overall by history and exam, recent data reviewed with pt, and pt to continue medical treatment as before,  to f/u any worsening symptoms or concerns, declines lab today

## 2020-02-12 NOTE — Assessment & Plan Note (Signed)
stable overall by history and exam, recent data reviewed with pt, and pt to continue medical treatment as before,  to f/u any worsening symptoms or concerns Lab Results  Component Value Date   HGBA1C 5.6 10/14/2019

## 2020-02-12 NOTE — Assessment & Plan Note (Addendum)
BP Readings from Last 3 Encounters:  02/07/20 130/80  10/18/19 122/68  10/14/19 (!) 148/80  stable overall by history and exam, recent data reviewed with pt, and pt to continue medical treatment as before,  to f/u any worsening symptoms or concerns  I spent 31 minutes in preparing to see the patient by review of recent labs, imaging and procedures, obtaining and reviewing separately obtained history, communicating with the patient and family or caregiver, ordering medications, tests or procedures, and documenting clinical information in the EHR including the differential Dx, treatment, and any further evaluation and other management of htn, dm, hld

## 2020-02-29 ENCOUNTER — Telehealth: Payer: Self-pay | Admitting: Internal Medicine

## 2020-02-29 DIAGNOSIS — I4891 Unspecified atrial fibrillation: Secondary | ICD-10-CM | POA: Diagnosis not present

## 2020-02-29 NOTE — Telephone Encounter (Signed)
Patient is requesting a call back in regards to a billing question.   Phone: [phone].

## 2020-03-09 ENCOUNTER — Other Ambulatory Visit: Payer: Self-pay | Admitting: Internal Medicine

## 2020-03-19 NOTE — Telephone Encounter (Signed)
   Patient calling to discuss $86 fee for TDAP from 02/07/20 for him and his wife

## 2020-03-27 ENCOUNTER — Ambulatory Visit: Payer: Medicare PPO | Admitting: Internal Medicine

## 2020-04-03 ENCOUNTER — Telehealth: Payer: Self-pay | Admitting: Internal Medicine

## 2020-04-03 NOTE — Telephone Encounter (Signed)
° ° °  Patient requesting Eliquis samples

## 2020-04-04 NOTE — Telephone Encounter (Signed)
Patient has been notified that eliqus samples are ready for pick up

## 2020-04-10 ENCOUNTER — Other Ambulatory Visit: Payer: Self-pay

## 2020-04-11 ENCOUNTER — Telehealth: Payer: Self-pay

## 2020-04-11 MED ORDER — ACCU-CHEK SOFTCLIX LANCETS MISC
5 refills | Status: DC
Start: 2020-04-11 — End: 2022-03-18

## 2020-04-11 MED ORDER — ACCU-CHEK GUIDE VI STRP
ORAL_STRIP | 0 refills | Status: DC
Start: 2020-04-11 — End: 2022-03-18

## 2020-04-12 NOTE — Telephone Encounter (Signed)
1.Medication Requested:Accu-Chek Softclix Lancets lancets   glucose blood (ACCU-CHEK GUIDE) test strip  Alcohol Swabs   2. Pharmacy (Name, [place]): [facility]. [place], [address]  3. On Med List: yes   4. Last Visit with PCP: 11.30.21  5. Next visit date with PCP: 5.31.22   Agent: Please be advised that RX refills may take up to 3 business days. We ask that you follow-up with your pharmacy.

## 2020-04-13 ENCOUNTER — Other Ambulatory Visit: Payer: Self-pay

## 2020-04-13 NOTE — Telephone Encounter (Signed)
Refill confirmed 2/2

## 2020-04-16 ENCOUNTER — Other Ambulatory Visit: Payer: Self-pay

## 2020-04-16 ENCOUNTER — Ambulatory Visit: Payer: Medicare PPO | Admitting: Internal Medicine

## 2020-06-13 ENCOUNTER — Telehealth: Payer: Self-pay

## 2020-06-14 DIAGNOSIS — I4892 Unspecified atrial flutter: Secondary | ICD-10-CM | POA: Diagnosis not present

## 2020-06-14 DIAGNOSIS — I4891 Unspecified atrial fibrillation: Secondary | ICD-10-CM | POA: Diagnosis not present

## 2020-06-15 DIAGNOSIS — I471 Supraventricular tachycardia: Secondary | ICD-10-CM | POA: Diagnosis not present

## 2020-07-03 DIAGNOSIS — E7800 Pure hypercholesterolemia, unspecified: Secondary | ICD-10-CM | POA: Diagnosis not present

## 2020-07-03 DIAGNOSIS — I10 Essential (primary) hypertension: Secondary | ICD-10-CM | POA: Diagnosis not present

## 2020-07-03 DIAGNOSIS — I480 Paroxysmal atrial fibrillation: Secondary | ICD-10-CM | POA: Diagnosis not present

## 2020-07-03 DIAGNOSIS — I2510 Atherosclerotic heart disease of native coronary artery without angina pectoris: Secondary | ICD-10-CM | POA: Diagnosis not present

## 2020-08-01 ENCOUNTER — Other Ambulatory Visit: Payer: Self-pay | Admitting: Internal Medicine

## 2020-08-01 DIAGNOSIS — Z79890 Hormone replacement therapy (postmenopausal): Secondary | ICD-10-CM | POA: Diagnosis not present

## 2020-08-01 DIAGNOSIS — R918 Other nonspecific abnormal finding of lung field: Secondary | ICD-10-CM | POA: Diagnosis not present

## 2020-08-01 DIAGNOSIS — Z9889 Other specified postprocedural states: Secondary | ICD-10-CM | POA: Diagnosis not present

## 2020-08-01 DIAGNOSIS — C73 Malignant neoplasm of thyroid gland: Secondary | ICD-10-CM | POA: Diagnosis not present

## 2020-08-01 DIAGNOSIS — Z483 Aftercare following surgery for neoplasm: Secondary | ICD-10-CM | POA: Diagnosis not present

## 2020-08-01 NOTE — Telephone Encounter (Signed)
Please refill as per office routine med refill policy (all routine meds refilled for 3 mo or monthly per pt preference up to one year from last visit, then month to month grace period for 3 mo, then further med refills will have to be denied)  

## 2020-08-07 ENCOUNTER — Ambulatory Visit (INDEPENDENT_AMBULATORY_CARE_PROVIDER_SITE_OTHER): Payer: Medicare PPO | Admitting: Internal Medicine

## 2020-08-07 ENCOUNTER — Other Ambulatory Visit: Payer: Self-pay

## 2020-08-07 ENCOUNTER — Encounter: Payer: Self-pay | Admitting: Internal Medicine

## 2020-08-07 VITALS — BP 124/68 | HR 59 | Temp 98.0°F | Ht 72.0 in | Wt 190.4 lb

## 2020-08-07 DIAGNOSIS — E1140 Type 2 diabetes mellitus with diabetic neuropathy, unspecified: Secondary | ICD-10-CM | POA: Diagnosis not present

## 2020-08-07 DIAGNOSIS — Z0000 Encounter for general adult medical examination without abnormal findings: Secondary | ICD-10-CM

## 2020-08-07 NOTE — Progress Notes (Signed)
Patient ID: Nathan Russo, male   DOB: 1940-08-09, 80 y.o.   MRN: [id number]         Chief Complaint:: wellness exam and Follow-up  DM       HPI:  Nathan Russo is a 80 y.o. male here for wellness exam; declines covid booster #1 and shingrix; o/w up to date with preventive referrals and immunizations.                         Also  Pt denies polydipsia, polyuria, or new focal neuro s/s.  Has no new complaints. Pt denies chest pain, increased sob or doe, wheezing, orthopnea, PND, increased LE swelling, palpitations, dizziness or syncope.     Wt Readings from Last 3 Encounters:  08/07/20 190 lb 6.4 oz (86.4 kg)  02/07/20 189 lb (85.7 kg)  10/18/19 198 lb (89.8 kg)   BP Readings from Last 3 Encounters:  08/07/20 124/68  02/07/20 130/80  10/18/19 122/68   Immunization History  Administered Date(s) Administered  . Fluad Quad(high Dose 65+) 12/13/2018, 02/07/2020  . Influenza Split 12/13/2010  . Influenza Whole 12/09/2007, 12/13/2009  . Influenza, High Dose Seasonal PF 12/10/2014, 11/04/2016, 11/10/2017  . Influenza, Seasonal, Injecte, Preservative Fre 12/08/2012  . Influenza,inj,Quad PF,6+ Mos 11/07/2015  . PFIZER(Purple Top)SARS-COV-2 Vaccination 04/03/2019, 05/31/2019  . Pneumococcal Conjugate-13 03/22/2013  . Pneumococcal Polysaccharide-23 01/08/2006, 03/25/2011  . Td 05/17/2009  . Tdap 02/07/2020   There are no preventive care reminders to display for this patient.    Past Medical History:  Diagnosis Date  . Abdominal pain, epigastric 04/11/2010  . BELCHING 04/23/2010  . BRADYCARDIA, CHRONIC 10/24/2006  . CARPAL TUNNEL SYNDROME, BILATERAL 02/07/2009  . CHEST PAIN-UNSPECIFIED 09/05/2008  . CHOLELITHIASIS 04/22/2010  . Cholelithiasis 06/13/2010  . COLONIC POLYPS, HX OF 04/28/2007  . DEGENERATIVE JOINT DISEASE, RIGHT KNEE 10/24/2006  . Depression 02/23/2011  . DIABETES MELLITUS, TYPE II 04/28/2007  . Dizziness and giddiness 12/19/2009  . Elevated PSA 06/13/2010  . FATIGUE 04/28/2007   . GERD 02/11/2009  . Headache(784.0) 12/19/2009  . HYPERLIPIDEMIA 04/28/2007  . HYPERTENSION 10/21/2006  . NECK MASS 02/07/2010  . OBESITY 10/24/2006  . OTITIS MEDIA, ACUTE, LEFT 12/26/2009  . PHIMOSIS 02/07/2009  . S/P laparoscopic cholecystectomy 10/31/2010  . Thyroid cancer ([place]) 06/13/2010  . THYROID NODULE 02/07/2010   Past Surgical History:  Procedure Laterality Date  . CHOLECYSTECTOMY    . left knee surgery    . right wrist surgury    . THYROID SURGERY      reports that he has quit smoking. He has never used smokeless tobacco. He reports that he does not drink alcohol and does not use drugs. family history includes Arthritis in his mother; Cancer in his brother and sister; Colon cancer (age of onset: 39) in his brother; Diabetes in his brother; Goiter in his mother; Heart attack (age of onset: 56) in his brother; Heart attack (age of onset: 80) in his father; Hypertension in his mother; Hypothyroidism in his sister. Allergies  Allergen Reactions  . Diphenoxylate-Atropine Other (See Comments)  . Other Other (See Comments) and Hives    Causes body to ache Causes body to ache  . Sulfa Antibiotics Other (See Comments)    As child almost died  . Ace Inhibitors     REACTION: cough  . Atenolol     REACTION: bradycardia  . Codeine Other (See Comments)    Head spins "wild"  . Levaquin [Levofloxacin]  Interferes with flecainide  . Lovastatin     REACTION: myalygros  . Morphine Nausea And Vomiting  . Morphine And Related Other (See Comments)  . Statins Other (See Comments)    Causes body to ache  . Sulfamethoxazole Other (See Comments)    Childhood unknown reaction  . Sulfasalazine Other (See Comments)    As child almost died  . Tramadol     Ants crawling all over   Current Outpatient Medications on File Prior to Visit  Medication Sig Dispense Refill  . Accu-Chek Softclix Lancets lancets Use to check blood sugars twice a day 100 each 5  . amiodarone (PACERONE) 200 MG  tablet Take by mouth.    [name] apixaban (ELIQUIS) 2.5 MG TABS tablet Take 1 tablet (2.5 mg total) by mouth 2 (two) times daily. 180 tablet 3  . Ascorbic Acid (VITAMIN C) 1000 MG tablet Take 1,000 mg by mouth daily.    [name] augmented betamethasone dipropionate (DIPROLENE-AF) 0.05 % ointment betamethasone, augmented 0.05 % topical ointment  APPLY OINTMENT TOPICALLY TO EACH EAR AS NEEDED FOR ITCHING    . blood glucose meter kit and supplies KIT Dispense based on patient and insurance preference. Use up to four times daily as directed. (FOR ICD-9 250.00, 250.01). 1 each 0  . Blood Glucose Monitoring Suppl Supplies MISC Use 1 strip as directed three times daily E11.9 300 each 3  . Calcium Carb-Cholecalciferol [phone] MG-UNIT CAPS Take by mouth.    . Cinnamon 500 MG capsule Take 500 mg by mouth 2 (two) times daily.    . cyanocobalamin 1000 MCG tablet Take by mouth.    . doxycycline (VIBRA-TABS) 100 MG tablet     . ezetimibe (ZETIA) 10 MG tablet     . Flaxseed, Linseed, 1000 MG CAPS Take 1 capsule by mouth daily.    . Garlic Oil 924 MG TABS Take by mouth 2 (two) times daily.    . Ginger, Zingiber officinalis, (GINGER PO) Take by mouth 4 (four) times daily.    . Glucosamine-Chondroit-Vit C-Mn (GLUCOSAMINE CHONDROITIN COMPLX) CAPS Take by mouth daily.    [name] glucose blood (ACCU-CHEK GUIDE) test strip USE TO CHECK BLOOD SUGAR 2 TO 3 TIMES DAILY 100 each 0  . isosorbide mononitrate (IMDUR) 60 MG 24 hr tablet Take by mouth.    . levothyroxine (SYNTHROID) 112 MCG tablet Take 1 tablet (112 mcg total) by mouth daily. 90 tablet 3  . losartan (COZAAR) 50 MG tablet     . metFORMIN (GLUCOPHAGE-XR) 500 MG 24 hr tablet Take 1 tablet by mouth twice daily 180 tablet 0  . metoprolol succinate (TOPROL-XL) 25 MG 24 hr tablet     . Multiple Vitamin (MULTIVITAMIN) capsule Take 1 capsule by mouth daily.    . nitroGLYCERIN (NITROSTAT) 0.4 MG SL tablet     . Omega-3 Fatty Acids (FISH OIL) 1000 MG CAPS Take by mouth 2 (two) times  daily.    . pantoprazole (PROTONIX) 40 MG tablet Take 2 tablets (80 mg total) by mouth daily. 90 tablet 3  . prasugrel (EFFIENT) 10 MG TABS tablet Take 10 mg by mouth daily.    . promethazine-dextromethorphan (PROMETHAZINE-DM) 6.25-15 MG/5ML syrup     . VITAMIN D, CHOLECALCIFEROL, PO Take by mouth daily.     No current facility-administered medications on file prior to visit.        ROS:  All others reviewed and negative.  Objective        PE:  BP 124/68 (BP Location: Left  Arm, Patient Position: Sitting, Cuff Size: Large)   Pulse (!) 59   Temp 98 F (36.7 C) (Oral)   Ht 6' (1.829 m)   Wt 190 lb 6.4 oz (86.4 kg)   SpO2 96%   BMI 25.82 kg/m                 Constitutional: Pt appears in NAD               HENT: Head: NCAT.                Right Ear: External ear normal.                 Left Ear: External ear normal.                Eyes: . Pupils are equal, round, and reactive to light. Conjunctivae and EOM are normal               Nose: without d/c or deformity               Neck: Neck supple. Gross normal ROM               Cardiovascular: Normal rate and regular rhythm.                 Pulmonary/Chest: Effort normal and breath sounds without rales or wheezing.                Abd:  Soft, NT, ND, + BS, no organomegaly               Neurological: Pt is alert. At baseline orientation, motor grossly intact               Skin: Skin is warm. No rashes, no other new lesions, LE edema - none               Psychiatric: Pt behavior is normal without agitation   Micro: none  Cardiac tracings I have personally interpreted today:  none  Pertinent Radiological findings (summarize): none   Lab Results  Component Value Date   WBC 6.3 10/14/2019   HGB 14.0 10/14/2019   HCT 40.7 10/14/2019   PLT 345 10/14/2019   GLUCOSE 89 10/14/2019   CHOL 128 05/10/2019   TRIG 137.0 05/10/2019   HDL 30.60 (L) 05/10/2019   LDLDIRECT 122.0 05/08/2017   LDLCALC 70 05/10/2019   ALT 28 10/14/2019   AST  29 10/14/2019   NA 142 10/14/2019   K 4.8 10/14/2019   CL 106 10/14/2019   CREATININE 1.43 (H) 10/14/2019   BUN 22 10/14/2019   CO2 24 10/14/2019   TSH 1.26 05/10/2019   PSA 3.59 05/08/2017   INR 1.0 08/22/2008   HGBA1C 5.6 10/14/2019   MICROALBUR 0.9 05/10/2019   Assessment/Plan:  Nathan Russo is a 80 y.o. White or Caucasian [1] male with  has a past medical history of Abdominal pain, epigastric (04/11/2010), BELCHING (04/23/2010), BRADYCARDIA, CHRONIC (10/24/2006), CARPAL TUNNEL SYNDROME, BILATERAL (02/07/2009), CHEST PAIN-UNSPECIFIED (09/05/2008), CHOLELITHIASIS (04/22/2010), Cholelithiasis (06/13/2010), COLONIC POLYPS, HX OF (04/28/2007), DEGENERATIVE JOINT DISEASE, RIGHT KNEE (10/24/2006), Depression (02/23/2011), DIABETES MELLITUS, TYPE II (04/28/2007), Dizziness and giddiness (12/19/2009), Elevated PSA (06/13/2010), FATIGUE (04/28/2007), GERD (02/11/2009), Headache(784.0) (12/19/2009), HYPERLIPIDEMIA (04/28/2007), HYPERTENSION (10/21/2006), NECK MASS (02/07/2010), OBESITY (10/24/2006), OTITIS MEDIA, ACUTE, LEFT (12/26/2009), PHIMOSIS (02/07/2009), S/P laparoscopic cholecystectomy (10/31/2010), Thyroid cancer ([place]) (06/13/2010), and THYROID NODULE (02/07/2010).  Routine history and physical examination of adult Age and sex appropriate education and counseling  updated with regular exercise and diet Referrals for preventative services - none needed Immunizations addressed - declines covid booster and shingrix for now Smoking counseling  - none needed Evidence for depression or other mood disorder - none significant Most recent labs reviewed. I have personally reviewed and have noted: 1) the patient's medical and social history 2) The patient's current medications and supplements 3) The patient's height, weight, and BMI have been recorded in the chart   Diabetes mellitus [facility]) Lab Results  Component Value Date   HGBA1C 5.6 10/14/2019   Stable, pt to continue current medical treatment -  metformin   Followup: Return in about 6 months (around 02/06/2021).  Cathlean Cower, MD 08/07/2020 7:37 PM [facility]

## 2020-08-07 NOTE — Assessment & Plan Note (Signed)
Lab Results  Component Value Date   HGBA1C 5.6 10/14/2019   Stable, pt to continue current medical treatment - metformin

## 2020-08-07 NOTE — Assessment & Plan Note (Signed)
Age and sex appropriate education and counseling updated with regular exercise and diet Referrals for preventative services - none needed Immunizations addressed - declines covid booster and shingrix for now Smoking counseling  - none needed Evidence for depression or other mood disorder - none significant Most recent labs reviewed. I have personally reviewed and have noted: 1) the patient's medical and social history 2) The patient's current medications and supplements 3) The patient's height, weight, and BMI have been recorded in the chart

## 2020-08-07 NOTE — Patient Instructions (Signed)
Please continue all other medications as before, and refills have been done if requested.  Please have the pharmacy call with any other refills you may need.  Please continue your efforts at being more active, low cholesterol diet, and weight control.  You are otherwise up to date with prevention measures today.  Please keep your appointments with your specialists as you may have planned  No blood work needed today  Please make an Appointment to return in 6 months, or sooner if needed

## 2020-08-11 DIAGNOSIS — F17210 Nicotine dependence, cigarettes, uncomplicated: Secondary | ICD-10-CM | POA: Diagnosis not present

## 2020-08-11 DIAGNOSIS — R0789 Other chest pain: Secondary | ICD-10-CM | POA: Diagnosis not present

## 2020-08-11 DIAGNOSIS — Z7984 Long term (current) use of oral hypoglycemic drugs: Secondary | ICD-10-CM | POA: Diagnosis not present

## 2020-08-11 DIAGNOSIS — R072 Precordial pain: Secondary | ICD-10-CM | POA: Diagnosis not present

## 2020-08-11 DIAGNOSIS — I440 Atrioventricular block, first degree: Secondary | ICD-10-CM | POA: Diagnosis not present

## 2020-08-11 DIAGNOSIS — R001 Bradycardia, unspecified: Secondary | ICD-10-CM | POA: Diagnosis not present

## 2020-08-11 DIAGNOSIS — I10 Essential (primary) hypertension: Secondary | ICD-10-CM | POA: Diagnosis not present

## 2020-08-11 DIAGNOSIS — I252 Old myocardial infarction: Secondary | ICD-10-CM | POA: Diagnosis not present

## 2020-08-11 DIAGNOSIS — R079 Chest pain, unspecified: Secondary | ICD-10-CM | POA: Diagnosis not present

## 2020-08-11 DIAGNOSIS — E119 Type 2 diabetes mellitus without complications: Secondary | ICD-10-CM | POA: Diagnosis not present

## 2020-08-12 DIAGNOSIS — I440 Atrioventricular block, first degree: Secondary | ICD-10-CM | POA: Diagnosis not present

## 2020-08-14 DIAGNOSIS — I480 Paroxysmal atrial fibrillation: Secondary | ICD-10-CM | POA: Diagnosis not present

## 2020-08-14 DIAGNOSIS — R0600 Dyspnea, unspecified: Secondary | ICD-10-CM | POA: Diagnosis not present

## 2020-08-14 DIAGNOSIS — E7800 Pure hypercholesterolemia, unspecified: Secondary | ICD-10-CM | POA: Diagnosis not present

## 2020-08-14 DIAGNOSIS — E118 Type 2 diabetes mellitus with unspecified complications: Secondary | ICD-10-CM | POA: Diagnosis not present

## 2020-08-14 DIAGNOSIS — I2510 Atherosclerotic heart disease of native coronary artery without angina pectoris: Secondary | ICD-10-CM | POA: Diagnosis not present

## 2020-08-14 DIAGNOSIS — I10 Essential (primary) hypertension: Secondary | ICD-10-CM | POA: Diagnosis not present

## 2020-08-16 ENCOUNTER — Other Ambulatory Visit: Payer: Self-pay | Admitting: Internal Medicine

## 2020-08-23 DIAGNOSIS — I2510 Atherosclerotic heart disease of native coronary artery without angina pectoris: Secondary | ICD-10-CM | POA: Diagnosis not present

## 2020-08-23 DIAGNOSIS — R0600 Dyspnea, unspecified: Secondary | ICD-10-CM | POA: Diagnosis not present

## 2020-09-04 NOTE — Telephone Encounter (Signed)
done

## 2020-09-20 ENCOUNTER — Telehealth: Payer: Self-pay | Admitting: *Deleted

## 2020-09-20 NOTE — Chronic Care Management (AMB) (Signed)
  Chronic Care Management   Note  09/20/2020 Name: Nathan Russo MRN: [id number] DOB: [birth year]  Nathan Russo is a 80 y.o. year old male who is a primary care patient of Biagio Borg, MD. I reached out to [name] by phone today in response to a referral sent by Nathan Russo PCP Biagio Borg, MD     [name] was given information about Chronic Care Management services today including:  CCM service includes personalized support from designated clinical staff supervised by his physician, including individualized plan of care and coordination with other care providers 24/7 contact phone numbers for assistance for urgent and routine care needs. Service will only be billed when office clinical staff spend 20 minutes or more in a month to coordinate care. Only one practitioner may furnish and bill the service in a calendar month. The patient may stop CCM services at any time (effective at the end of the month) by phone call to the office staff. The patient will be responsible for cost sharing (co-pay) of up to 20% of the service fee (after annual deductible is met).  Patient agreed to services and verbal consent obtained.   Follow up plan: Telephone appointment with care management team member scheduled for:10/02/2020  [name], [place], [employer]  Direct Dial: [phone]

## 2020-10-02 ENCOUNTER — Telehealth: Payer: Self-pay

## 2020-10-02 ENCOUNTER — Telehealth: Payer: Medicare PPO

## 2020-10-02 ENCOUNTER — Encounter: Payer: Self-pay | Admitting: *Deleted

## 2020-10-02 ENCOUNTER — Telehealth: Payer: Self-pay | Admitting: *Deleted

## 2020-10-02 NOTE — Telephone Encounter (Signed)
Patient calling to confirm expected 2pm call

## 2020-10-02 NOTE — Telephone Encounter (Signed)
  Chronic Care Management   Follow Up Note   10/02/2020 Name: Nathan Russo MRN: [id number] DOB: 01/09/1941  Referred by: Biagio Borg, MD Reason for referral : Chronic Care Management (CCM RN CM Initial Outreach- patient declines ongoing CCM services)  Successful contact was made with the patient to discuss care management and care coordination services. Patient declines engagement at this time.   Patient answered phone call at scheduled appointment time; HIPAA/ identity verified; patient immediately reported that he was in the middle of a project at home and did not have time to speak with me; I offered to re-schedule today's CCM RN CM initial assessment/ outreach appointment, and he verbalized confusion as to the purpose of today's call.  CCM services were explained and discussed with patient and today, he states he does not feel that these services are necessary for him; he denies care management/ care coordination needs today, and asks that I make him inactive with CCM services.  Offered to provide my direct phone number to him should he change his mind, or wish to contact me directly in the future and he declined, stating he would contact Dr. Gwynn Burly office should he change his mind.  Follow Up Plan:  Will make PCP aware that patient has declined care management/ CCM services today and will patient inactive with CCM program   Nathan Rack, RN, BSN, [place] [phone]: direct office [phone]: mobile

## 2020-10-24 DIAGNOSIS — N289 Disorder of kidney and ureter, unspecified: Secondary | ICD-10-CM | POA: Diagnosis not present

## 2020-10-24 DIAGNOSIS — R000 Tachycardia, unspecified: Secondary | ICD-10-CM | POA: Diagnosis not present

## 2020-10-24 DIAGNOSIS — N1832 Chronic kidney disease, stage 3b: Secondary | ICD-10-CM | POA: Insufficient documentation

## 2020-10-24 DIAGNOSIS — I10 Essential (primary) hypertension: Secondary | ICD-10-CM | POA: Diagnosis not present

## 2020-10-24 DIAGNOSIS — N179 Acute kidney failure, unspecified: Secondary | ICD-10-CM | POA: Diagnosis not present

## 2020-10-24 DIAGNOSIS — E0580 Other thyrotoxicosis without thyrotoxic crisis or storm: Secondary | ICD-10-CM | POA: Insufficient documentation

## 2020-10-24 DIAGNOSIS — I4892 Unspecified atrial flutter: Secondary | ICD-10-CM | POA: Diagnosis not present

## 2020-10-24 DIAGNOSIS — R002 Palpitations: Secondary | ICD-10-CM | POA: Diagnosis not present

## 2020-10-24 DIAGNOSIS — N184 Chronic kidney disease, stage 4 (severe): Secondary | ICD-10-CM | POA: Diagnosis not present

## 2020-10-24 DIAGNOSIS — I440 Atrioventricular block, first degree: Secondary | ICD-10-CM | POA: Diagnosis not present

## 2020-10-24 DIAGNOSIS — I160 Hypertensive urgency: Secondary | ICD-10-CM | POA: Diagnosis not present

## 2020-10-24 DIAGNOSIS — I452 Bifascicular block: Secondary | ICD-10-CM | POA: Diagnosis not present

## 2020-10-24 DIAGNOSIS — I2510 Atherosclerotic heart disease of native coronary artery without angina pectoris: Secondary | ICD-10-CM | POA: Diagnosis not present

## 2020-10-24 DIAGNOSIS — I252 Old myocardial infarction: Secondary | ICD-10-CM | POA: Diagnosis not present

## 2020-10-24 DIAGNOSIS — E1159 Type 2 diabetes mellitus with other circulatory complications: Secondary | ICD-10-CM | POA: Diagnosis not present

## 2020-10-24 DIAGNOSIS — R9431 Abnormal electrocardiogram [ECG] [EKG]: Secondary | ICD-10-CM | POA: Diagnosis not present

## 2020-10-24 DIAGNOSIS — I480 Paroxysmal atrial fibrillation: Secondary | ICD-10-CM | POA: Diagnosis not present

## 2020-10-29 DIAGNOSIS — I255 Ischemic cardiomyopathy: Secondary | ICD-10-CM | POA: Diagnosis not present

## 2020-10-29 DIAGNOSIS — I499 Cardiac arrhythmia, unspecified: Secondary | ICD-10-CM | POA: Diagnosis not present

## 2020-10-29 DIAGNOSIS — I480 Paroxysmal atrial fibrillation: Secondary | ICD-10-CM | POA: Diagnosis not present

## 2020-10-29 DIAGNOSIS — G4733 Obstructive sleep apnea (adult) (pediatric): Secondary | ICD-10-CM | POA: Diagnosis not present

## 2020-10-29 DIAGNOSIS — I951 Orthostatic hypotension: Secondary | ICD-10-CM | POA: Diagnosis not present

## 2020-10-29 DIAGNOSIS — K219 Gastro-esophageal reflux disease without esophagitis: Secondary | ICD-10-CM | POA: Diagnosis not present

## 2020-10-29 DIAGNOSIS — C73 Malignant neoplasm of thyroid gland: Secondary | ICD-10-CM | POA: Diagnosis not present

## 2020-10-29 DIAGNOSIS — I10 Essential (primary) hypertension: Secondary | ICD-10-CM | POA: Diagnosis not present

## 2020-10-29 DIAGNOSIS — E118 Type 2 diabetes mellitus with unspecified complications: Secondary | ICD-10-CM | POA: Diagnosis not present

## 2020-11-05 ENCOUNTER — Telehealth: Payer: Self-pay

## 2020-11-15 ENCOUNTER — Ambulatory Visit (INDEPENDENT_AMBULATORY_CARE_PROVIDER_SITE_OTHER): Payer: Medicare PPO

## 2020-11-15 ENCOUNTER — Ambulatory Visit (INDEPENDENT_AMBULATORY_CARE_PROVIDER_SITE_OTHER): Payer: Medicare PPO | Admitting: Internal Medicine

## 2020-11-15 ENCOUNTER — Encounter: Payer: Self-pay | Admitting: Internal Medicine

## 2020-11-15 ENCOUNTER — Other Ambulatory Visit: Payer: Self-pay

## 2020-11-15 VITALS — BP 144/68 | HR 53 | Temp 97.5°F | Ht 72.0 in | Wt 196.0 lb

## 2020-11-15 DIAGNOSIS — I10 Essential (primary) hypertension: Secondary | ICD-10-CM

## 2020-11-15 DIAGNOSIS — R102 Pelvic and perineal pain: Secondary | ICD-10-CM | POA: Diagnosis not present

## 2020-11-15 DIAGNOSIS — E1140 Type 2 diabetes mellitus with diabetic neuropathy, unspecified: Secondary | ICD-10-CM

## 2020-11-15 DIAGNOSIS — R1031 Right lower quadrant pain: Secondary | ICD-10-CM | POA: Diagnosis not present

## 2020-11-15 NOTE — Progress Notes (Signed)
Patient ID: Nathan Russo, male   DOB: January 31, 1941, 80 y.o.   MRN: [id number]        Chief Complaint: follow up right groin pain, elevated BP, dm       HPI:  Nathan Russo is a 80 y.o. male here with mild ot mod intermittent right groin pain he is wondering might be a hernia, though not sure about any swelling  or mass.  Ongoing for several months.  Has been doing some leg lift excercises and pain seems to continue though he stopped the leg excercises a few exercise  Also notices on the right groin with leaning forward sitting at the desk,working on his upcoming sermons. Also has had recent constipatoin and les frequent BM since started amiodarone, now down to once per day or less, and more hard stool to start the BM and strains with this.  Has just recently started stool softner and prunes.    Also, Wt up several lbs he attributes to recent events and amiodarone as mentioned per cardiology recently, also has some lower abd pains possibly med related as well.  Losartan has been increased recently and BP at home has been ok.  Pt plans to f/u with his cardiologist on these points.    Pt denies chest pain, increased sob or doe, wheezing, orthopnea, PND, increased LE swelling, palpitations, dizziness or syncope.   Pt denies polydipsia, polyuria, or new focal neuro s/s.  Wt Readings from Last 3 Encounters:  11/15/20 196 lb (88.9 kg)  08/07/20 190 lb 6.4 oz (86.4 kg)  02/07/20 189 lb (85.7 kg)   BP Readings from Last 3 Encounters:  11/15/20 (!) 144/68  08/07/20 124/68  02/07/20 130/80         Past Medical History:  Diagnosis Date   Abdominal pain, epigastric 04/11/2010   BELCHING 04/23/2010   BRADYCARDIA, CHRONIC 10/24/2006   CARPAL TUNNEL SYNDROME, BILATERAL 02/07/2009   CHEST PAIN-UNSPECIFIED 09/05/2008   CHOLELITHIASIS 04/22/2010   Cholelithiasis 06/13/2010   COLONIC POLYPS, HX OF 04/28/2007   DEGENERATIVE JOINT DISEASE, RIGHT KNEE 10/24/2006   Depression 02/23/2011   DIABETES MELLITUS, TYPE II  04/28/2007   Dizziness and giddiness 12/19/2009   Elevated PSA 06/13/2010   FATIGUE 04/28/2007   GERD 02/11/2009   Headache(784.0) 12/19/2009   HYPERLIPIDEMIA 04/28/2007   HYPERTENSION 10/21/2006   NECK MASS 02/07/2010   OBESITY 10/24/2006   OTITIS MEDIA, ACUTE, LEFT 12/26/2009   PHIMOSIS 02/07/2009   S/P laparoscopic cholecystectomy 10/31/2010   Thyroid cancer ([place]) 06/13/2010   THYROID NODULE 02/07/2010   Past Surgical History:  Procedure Laterality Date   CHOLECYSTECTOMY     left knee surgery     right wrist surgury     THYROID SURGERY      reports that he has quit smoking. He has never used smokeless tobacco. He reports that he does not drink alcohol and does not use drugs. family history includes Arthritis in his mother; Cancer in his brother and sister; Colon cancer (age of onset: 6) in his brother; Diabetes in his brother; Goiter in his mother; Heart attack (age of onset: 71) in his brother; Heart attack (age of onset: 9) in his father; Hypertension in his mother; Hypothyroidism in his sister. Allergies  Allergen Reactions   Diphenoxylate-Atropine Other (See Comments)   Other Other (See Comments) and Hives    Causes body to ache Causes body to ache   Sulfa Antibiotics Other (See Comments)    As child almost died   Ace  Inhibitors     REACTION: cough   Atenolol     REACTION: bradycardia   Codeine Other (See Comments)    Head spins "wild"   Levaquin [Levofloxacin]     Interferes with flecainide   Lovastatin     REACTION: myalygros   Morphine Nausea And Vomiting   Morphine And Related Other (See Comments)   Statins Other (See Comments)    Causes body to ache   Sulfamethoxazole Other (See Comments)    Childhood unknown reaction   Sulfasalazine Other (See Comments)    As child almost died   Tramadol     Ants crawling all over   Current Outpatient Medications on File Prior to Visit  Medication Sig Dispense Refill   Accu-Chek Softclix Lancets lancets Use to check blood  sugars twice a day 100 each 5   amiodarone (PACERONE) 200 MG tablet Take by mouth.     apixaban (ELIQUIS) 2.5 MG TABS tablet Take 1 tablet (2.5 mg total) by mouth 2 (two) times daily. 180 tablet 3   Ascorbic Acid (VITAMIN C) 1000 MG tablet Take 1,000 mg by mouth daily.     augmented betamethasone dipropionate (DIPROLENE-AF) 0.05 % ointment betamethasone, augmented 0.05 % topical ointment  APPLY OINTMENT TOPICALLY TO EACH EAR AS NEEDED FOR ITCHING     blood glucose meter kit and supplies KIT Dispense based on patient and insurance preference. Use up to four times daily as directed. (FOR ICD-9 250.00, 250.01). 1 each 0   Blood Glucose Monitoring Suppl Supplies MISC Use 1 strip as directed three times daily E11.9 300 each 3   Calcium Carb-Cholecalciferol [phone] MG-UNIT CAPS Take by mouth.     Cinnamon 500 MG capsule Take 500 mg by mouth 2 (two) times daily.     cyanocobalamin 1000 MCG tablet Take by mouth.     doxycycline (VIBRA-TABS) 100 MG tablet      ezetimibe (ZETIA) 10 MG tablet      Flaxseed, Linseed, 1000 MG CAPS Take 1 capsule by mouth daily.     Garlic Oil 330 MG TABS Take by mouth 2 (two) times daily.     Ginger, Zingiber officinalis, (GINGER PO) Take by mouth 4 (four) times daily.     Glucosamine-Chondroit-Vit C-Mn (GLUCOSAMINE CHONDROITIN COMPLX) CAPS Take by mouth daily.     glucose blood (ACCU-CHEK GUIDE) test strip USE TO CHECK BLOOD SUGAR 2 TO 3 TIMES DAILY 100 each 0   isosorbide mononitrate (IMDUR) 60 MG 24 hr tablet Take by mouth.     levothyroxine (SYNTHROID) 112 MCG tablet Take 1 tablet (112 mcg total) by mouth daily. 90 tablet 3   losartan (COZAAR) 50 MG tablet      metFORMIN (GLUCOPHAGE-XR) 500 MG 24 hr tablet Take 1 tablet by mouth twice daily 180 tablet 0   metoprolol succinate (TOPROL-XL) 25 MG 24 hr tablet      Multiple Vitamin (MULTIVITAMIN) capsule Take 1 capsule by mouth daily.     nitroGLYCERIN (NITROSTAT) 0.4 MG SL tablet      Omega-3 Fatty Acids (FISH OIL)  1000 MG CAPS Take by mouth 2 (two) times daily.     pantoprazole (PROTONIX) 40 MG tablet Take 2 tablets by mouth once daily 180 tablet 0   prasugrel (EFFIENT) 10 MG TABS tablet Take 10 mg by mouth daily.     promethazine-dextromethorphan (PROMETHAZINE-DM) 6.25-15 MG/5ML syrup      VITAMIN D, CHOLECALCIFEROL, PO Take by mouth daily.     No current facility-administered medications on  file prior to visit.        ROS:  All others reviewed and negative.  Objective        PE:  BP (!) 144/68 (BP Location: Right Arm, Patient Position: Sitting, Cuff Size: Large)   Pulse (!) 53   Temp (!) 97.5 F (36.4 C) (Oral)   Ht 6' (1.829 m)   Wt 196 lb (88.9 kg)   SpO2 95%   BMI 26.58 kg/m                 Constitutional: Pt appears in NAD               HENT: Head: NCAT.                Right Ear: External ear normal.                 Left Ear: External ear normal.                Eyes: . Pupils are equal, round, and reactive to light. Conjunctivae and EOM are normal               Nose: without d/c or deformity               Neck: Neck supple. Gross normal ROM               Cardiovascular: Normal rate and regular rhythm.                 Pulmonary/Chest: Effort normal and breath sounds without rales or wheezing.                Abd:  Soft, NT, ND, + BS, no organomegaly               Neurological: Pt is alert. At baseline orientation, motor grossly intact               Skin: Skin is warm. No rashes, no other new lesions, LE edema - none               Psychiatric: Pt behavior is normal without agitation   Micro: none  Cardiac tracings I have personally interpreted today:  none  Pertinent Radiological findings (summarize): none   Lab Results  Component Value Date   WBC 6.3 10/14/2019   HGB 14.0 10/14/2019   HCT 40.7 10/14/2019   PLT 345 10/14/2019   GLUCOSE 89 10/14/2019   CHOL 128 05/10/2019   TRIG 137.0 05/10/2019   HDL 30.60 (L) 05/10/2019   LDLDIRECT 122.0 05/08/2017   LDLCALC 70  05/10/2019   ALT 28 10/14/2019   AST 29 10/14/2019   NA 142 10/14/2019   K 4.8 10/14/2019   CL 106 10/14/2019   CREATININE 1.43 (H) 10/14/2019   BUN 22 10/14/2019   CO2 24 10/14/2019   TSH 1.26 05/10/2019   PSA 3.59 05/08/2017   INR 1.0 08/22/2008   HGBA1C 5.6 10/14/2019   MICROALBUR 0.9 05/10/2019   Assessment/Plan:  Nathan Russo is a 80 y.o. White or Caucasian [1] male with  has a past medical history of Abdominal pain, epigastric (04/11/2010), BELCHING (04/23/2010), BRADYCARDIA, CHRONIC (10/24/2006), CARPAL TUNNEL SYNDROME, BILATERAL (02/07/2009), CHEST PAIN-UNSPECIFIED (09/05/2008), CHOLELITHIASIS (04/22/2010), Cholelithiasis (06/13/2010), COLONIC POLYPS, HX OF (04/28/2007), DEGENERATIVE JOINT DISEASE, RIGHT KNEE (10/24/2006), Depression (02/23/2011), DIABETES MELLITUS, TYPE II (04/28/2007), Dizziness and giddiness (12/19/2009), Elevated PSA (06/13/2010), FATIGUE (04/28/2007), GERD (02/11/2009), Headache(784.0) (12/19/2009), HYPERLIPIDEMIA (04/28/2007), HYPERTENSION (10/21/2006), NECK MASS (02/07/2010), OBESITY (10/24/2006),  OTITIS MEDIA, ACUTE, LEFT (12/26/2009), PHIMOSIS (02/07/2009), S/P laparoscopic cholecystectomy (10/31/2010), Thyroid cancer ([place]) (06/13/2010), and THYROID NODULE (02/07/2010).  Right groin pain No swelling or mass, etiology not clea, for right hip film,  to f/u any worsening symptoms or concerns   Essential hypertension BP Readings from Last 3 Encounters:  11/15/20 (!) 144/68  08/07/20 124/68  02/07/20 130/80   Uncontrolled today, pt states < 140/90 at home, pt to continue medical treatment losartan, toprol as declines change today   Diabetes mellitus ([place]) Lab Results  Component Value Date   HGBA1C 5.6 10/14/2019   Stable, pt to continue current medical treatment metformin  Followup: Return in about 3 months (around 02/06/2021).  Cathlean Cower, MD 11/18/2020 10:16 PM [facility]

## 2020-11-15 NOTE — Patient Instructions (Addendum)
Please continue all other medications as before, and refills have been done if requested.  Please have the pharmacy call with any other refills you may need.  Please continue your efforts at being more active, low cholesterol diet, and weight control.  Please keep your appointments with your specialists as you may have planned  Please go to the XRAY Department in the first floor for the x-ray testing  You will be contacted by phone if any changes need to be made immediately.  Otherwise, you will receive a letter about your results with an explanation, but please check with MyChart first.  Please remember to sign up for MyChart if you have not done so, as this will be important to you in the future with finding out test results, communicating by private email, and scheduling acute appointments online when needed.     

## 2020-11-17 ENCOUNTER — Encounter: Payer: Self-pay | Admitting: Internal Medicine

## 2020-11-18 ENCOUNTER — Encounter: Payer: Self-pay | Admitting: Internal Medicine

## 2020-11-18 NOTE — Assessment & Plan Note (Signed)
BP Readings from Last 3 Encounters:  11/15/20 (!) 144/68  08/07/20 124/68  02/07/20 130/80   Uncontrolled today, pt states < 140/90 at home, pt to continue medical treatment losartan, toprol as declines change today

## 2020-11-18 NOTE — Assessment & Plan Note (Signed)
Lab Results  Component Value Date   HGBA1C 5.6 10/14/2019   Stable, pt to continue current medical treatment metformin

## 2020-11-18 NOTE — Assessment & Plan Note (Signed)
No swelling or mass, etiology not clea, for right hip film,  to f/u any worsening symptoms or concerns

## 2020-11-21 ENCOUNTER — Telehealth: Payer: Self-pay | Admitting: Internal Medicine

## 2020-11-21 NOTE — Telephone Encounter (Signed)
[employer] message:  Pt was mowing earlier this evening and a bee flew up under glasses and stung him on the face near the nose/under left eye. has mild headache. doesn't see a stinger. has been putting ice and hydrocortisone on it  No appointment needed at this time   11/20/2020 10:49:55 [place], RN, [employer]  Telephone advice will be scanned into My Chart

## 2020-11-28 DIAGNOSIS — Z20828 Contact with and (suspected) exposure to other viral communicable diseases: Secondary | ICD-10-CM | POA: Diagnosis not present

## 2020-11-28 DIAGNOSIS — R0981 Nasal congestion: Secondary | ICD-10-CM | POA: Diagnosis not present

## 2020-11-28 DIAGNOSIS — R1110 Vomiting, unspecified: Secondary | ICD-10-CM | POA: Diagnosis not present

## 2020-11-28 DIAGNOSIS — R079 Chest pain, unspecified: Secondary | ICD-10-CM | POA: Diagnosis not present

## 2020-11-28 DIAGNOSIS — R051 Acute cough: Secondary | ICD-10-CM | POA: Diagnosis not present

## 2020-11-29 DIAGNOSIS — Z20828 Contact with and (suspected) exposure to other viral communicable diseases: Secondary | ICD-10-CM | POA: Diagnosis not present

## 2020-11-29 DIAGNOSIS — R051 Acute cough: Secondary | ICD-10-CM | POA: Diagnosis not present

## 2020-11-29 DIAGNOSIS — J349 Unspecified disorder of nose and nasal sinuses: Secondary | ICD-10-CM | POA: Diagnosis not present

## 2020-11-29 DIAGNOSIS — J111 Influenza due to unidentified influenza virus with other respiratory manifestations: Secondary | ICD-10-CM | POA: Diagnosis not present

## 2020-12-04 ENCOUNTER — Other Ambulatory Visit: Payer: Self-pay | Admitting: Internal Medicine

## 2020-12-04 NOTE — Telephone Encounter (Signed)
Please refill as per office routine med refill policy (all routine meds to be refilled for 3 mo or monthly (per pt preference) up to one year from last visit, then month to month grace period for 3 mo, then further med refills will have to be denied) ? ?

## 2020-12-11 DIAGNOSIS — R079 Chest pain, unspecified: Secondary | ICD-10-CM | POA: Diagnosis not present

## 2020-12-11 DIAGNOSIS — Z7984 Long term (current) use of oral hypoglycemic drugs: Secondary | ICD-10-CM | POA: Diagnosis not present

## 2020-12-11 DIAGNOSIS — I4581 Long QT syndrome: Secondary | ICD-10-CM | POA: Diagnosis not present

## 2020-12-11 DIAGNOSIS — E1122 Type 2 diabetes mellitus with diabetic chronic kidney disease: Secondary | ICD-10-CM | POA: Diagnosis not present

## 2020-12-11 DIAGNOSIS — Z7901 Long term (current) use of anticoagulants: Secondary | ICD-10-CM | POA: Diagnosis not present

## 2020-12-11 DIAGNOSIS — I129 Hypertensive chronic kidney disease with stage 1 through stage 4 chronic kidney disease, or unspecified chronic kidney disease: Secondary | ICD-10-CM | POA: Diagnosis not present

## 2020-12-11 DIAGNOSIS — D631 Anemia in chronic kidney disease: Secondary | ICD-10-CM | POA: Diagnosis not present

## 2020-12-11 DIAGNOSIS — I440 Atrioventricular block, first degree: Secondary | ICD-10-CM | POA: Diagnosis not present

## 2020-12-11 DIAGNOSIS — Z87891 Personal history of nicotine dependence: Secondary | ICD-10-CM | POA: Diagnosis not present

## 2020-12-11 DIAGNOSIS — N189 Chronic kidney disease, unspecified: Secondary | ICD-10-CM | POA: Diagnosis not present

## 2020-12-11 DIAGNOSIS — R0989 Other specified symptoms and signs involving the circulatory and respiratory systems: Secondary | ICD-10-CM | POA: Diagnosis not present

## 2020-12-11 DIAGNOSIS — R0789 Other chest pain: Secondary | ICD-10-CM | POA: Diagnosis not present

## 2020-12-11 DIAGNOSIS — I10 Essential (primary) hypertension: Secondary | ICD-10-CM | POA: Diagnosis not present

## 2020-12-11 DIAGNOSIS — I480 Paroxysmal atrial fibrillation: Secondary | ICD-10-CM | POA: Diagnosis not present

## 2020-12-17 DIAGNOSIS — D7389 Other diseases of spleen: Secondary | ICD-10-CM | POA: Diagnosis not present

## 2020-12-17 DIAGNOSIS — Z8616 Personal history of COVID-19: Secondary | ICD-10-CM | POA: Diagnosis not present

## 2020-12-17 DIAGNOSIS — D649 Anemia, unspecified: Secondary | ICD-10-CM | POA: Diagnosis not present

## 2020-12-17 DIAGNOSIS — N400 Enlarged prostate without lower urinary tract symptoms: Secondary | ICD-10-CM | POA: Diagnosis not present

## 2020-12-17 DIAGNOSIS — Z87891 Personal history of nicotine dependence: Secondary | ICD-10-CM | POA: Diagnosis not present

## 2020-12-17 DIAGNOSIS — K5730 Diverticulosis of large intestine without perforation or abscess without bleeding: Secondary | ICD-10-CM | POA: Diagnosis not present

## 2020-12-17 DIAGNOSIS — N281 Cyst of kidney, acquired: Secondary | ICD-10-CM | POA: Diagnosis not present

## 2020-12-17 DIAGNOSIS — J9811 Atelectasis: Secondary | ICD-10-CM | POA: Diagnosis not present

## 2020-12-17 DIAGNOSIS — R509 Fever, unspecified: Secondary | ICD-10-CM | POA: Diagnosis not present

## 2020-12-17 DIAGNOSIS — R1084 Generalized abdominal pain: Secondary | ICD-10-CM | POA: Diagnosis not present

## 2020-12-17 DIAGNOSIS — R0789 Other chest pain: Secondary | ICD-10-CM | POA: Diagnosis not present

## 2020-12-17 DIAGNOSIS — R002 Palpitations: Secondary | ICD-10-CM | POA: Diagnosis not present

## 2020-12-17 DIAGNOSIS — K5790 Diverticulosis of intestine, part unspecified, without perforation or abscess without bleeding: Secondary | ICD-10-CM | POA: Diagnosis not present

## 2020-12-18 DIAGNOSIS — N400 Enlarged prostate without lower urinary tract symptoms: Secondary | ICD-10-CM | POA: Diagnosis not present

## 2020-12-18 DIAGNOSIS — R1084 Generalized abdominal pain: Secondary | ICD-10-CM | POA: Diagnosis not present

## 2020-12-18 DIAGNOSIS — I440 Atrioventricular block, first degree: Secondary | ICD-10-CM | POA: Diagnosis not present

## 2020-12-18 DIAGNOSIS — J984 Other disorders of lung: Secondary | ICD-10-CM | POA: Diagnosis not present

## 2020-12-18 DIAGNOSIS — K5730 Diverticulosis of large intestine without perforation or abscess without bleeding: Secondary | ICD-10-CM | POA: Diagnosis not present

## 2020-12-18 DIAGNOSIS — D7389 Other diseases of spleen: Secondary | ICD-10-CM | POA: Diagnosis not present

## 2020-12-18 DIAGNOSIS — N281 Cyst of kidney, acquired: Secondary | ICD-10-CM | POA: Diagnosis not present

## 2020-12-18 DIAGNOSIS — I4510 Unspecified right bundle-branch block: Secondary | ICD-10-CM | POA: Diagnosis not present

## 2020-12-18 DIAGNOSIS — D649 Anemia, unspecified: Secondary | ICD-10-CM | POA: Diagnosis not present

## 2020-12-18 DIAGNOSIS — R509 Fever, unspecified: Secondary | ICD-10-CM | POA: Diagnosis not present

## 2020-12-18 DIAGNOSIS — R61 Generalized hyperhidrosis: Secondary | ICD-10-CM | POA: Diagnosis not present

## 2021-01-01 DIAGNOSIS — B359 Dermatophytosis, unspecified: Secondary | ICD-10-CM | POA: Diagnosis not present

## 2021-01-01 DIAGNOSIS — I872 Venous insufficiency (chronic) (peripheral): Secondary | ICD-10-CM | POA: Diagnosis not present

## 2021-01-01 DIAGNOSIS — B353 Tinea pedis: Secondary | ICD-10-CM | POA: Diagnosis not present

## 2021-01-07 DIAGNOSIS — I255 Ischemic cardiomyopathy: Secondary | ICD-10-CM | POA: Diagnosis not present

## 2021-01-07 DIAGNOSIS — I2510 Atherosclerotic heart disease of native coronary artery without angina pectoris: Secondary | ICD-10-CM | POA: Diagnosis not present

## 2021-01-07 DIAGNOSIS — I420 Dilated cardiomyopathy: Secondary | ICD-10-CM | POA: Diagnosis not present

## 2021-01-07 DIAGNOSIS — R001 Bradycardia, unspecified: Secondary | ICD-10-CM | POA: Diagnosis not present

## 2021-01-07 DIAGNOSIS — E118 Type 2 diabetes mellitus with unspecified complications: Secondary | ICD-10-CM | POA: Diagnosis not present

## 2021-01-07 DIAGNOSIS — E7800 Pure hypercholesterolemia, unspecified: Secondary | ICD-10-CM | POA: Diagnosis not present

## 2021-01-07 DIAGNOSIS — I480 Paroxysmal atrial fibrillation: Secondary | ICD-10-CM | POA: Diagnosis not present

## 2021-01-07 DIAGNOSIS — R079 Chest pain, unspecified: Secondary | ICD-10-CM | POA: Diagnosis not present

## 2021-01-07 DIAGNOSIS — I10 Essential (primary) hypertension: Secondary | ICD-10-CM | POA: Diagnosis not present

## 2021-01-07 DIAGNOSIS — I440 Atrioventricular block, first degree: Secondary | ICD-10-CM | POA: Diagnosis not present

## 2021-01-15 DIAGNOSIS — Z23 Encounter for immunization: Secondary | ICD-10-CM | POA: Diagnosis not present

## 2021-01-15 DIAGNOSIS — L570 Actinic keratosis: Secondary | ICD-10-CM | POA: Diagnosis not present

## 2021-01-15 DIAGNOSIS — Z85828 Personal history of other malignant neoplasm of skin: Secondary | ICD-10-CM | POA: Diagnosis not present

## 2021-01-15 DIAGNOSIS — D224 Melanocytic nevi of scalp and neck: Secondary | ICD-10-CM | POA: Diagnosis not present

## 2021-01-15 DIAGNOSIS — L578 Other skin changes due to chronic exposure to nonionizing radiation: Secondary | ICD-10-CM | POA: Diagnosis not present

## 2021-01-15 DIAGNOSIS — L821 Other seborrheic keratosis: Secondary | ICD-10-CM | POA: Diagnosis not present

## 2021-01-25 DIAGNOSIS — M25562 Pain in left knee: Secondary | ICD-10-CM | POA: Diagnosis not present

## 2021-01-25 DIAGNOSIS — M1712 Unilateral primary osteoarthritis, left knee: Secondary | ICD-10-CM | POA: Diagnosis not present

## 2021-01-30 DIAGNOSIS — Z87891 Personal history of nicotine dependence: Secondary | ICD-10-CM | POA: Diagnosis not present

## 2021-01-30 DIAGNOSIS — I4519 Other right bundle-branch block: Secondary | ICD-10-CM | POA: Diagnosis not present

## 2021-01-30 DIAGNOSIS — J9811 Atelectasis: Secondary | ICD-10-CM | POA: Diagnosis not present

## 2021-01-30 DIAGNOSIS — I440 Atrioventricular block, first degree: Secondary | ICD-10-CM | POA: Diagnosis not present

## 2021-01-30 DIAGNOSIS — R0789 Other chest pain: Secondary | ICD-10-CM | POA: Diagnosis not present

## 2021-01-30 DIAGNOSIS — R002 Palpitations: Secondary | ICD-10-CM | POA: Diagnosis not present

## 2021-01-30 DIAGNOSIS — R000 Tachycardia, unspecified: Secondary | ICD-10-CM | POA: Diagnosis not present

## 2021-01-30 DIAGNOSIS — R918 Other nonspecific abnormal finding of lung field: Secondary | ICD-10-CM | POA: Diagnosis not present

## 2021-01-30 DIAGNOSIS — I480 Paroxysmal atrial fibrillation: Secondary | ICD-10-CM | POA: Diagnosis not present

## 2021-01-30 DIAGNOSIS — R079 Chest pain, unspecified: Secondary | ICD-10-CM | POA: Diagnosis not present

## 2021-01-30 DIAGNOSIS — I10 Essential (primary) hypertension: Secondary | ICD-10-CM | POA: Diagnosis not present

## 2021-02-02 DIAGNOSIS — I2109 ST elevation (STEMI) myocardial infarction involving other coronary artery of anterior wall: Secondary | ICD-10-CM | POA: Diagnosis not present

## 2021-02-02 DIAGNOSIS — I4510 Unspecified right bundle-branch block: Secondary | ICD-10-CM | POA: Diagnosis not present

## 2021-02-02 DIAGNOSIS — I440 Atrioventricular block, first degree: Secondary | ICD-10-CM | POA: Diagnosis not present

## 2021-02-06 ENCOUNTER — Ambulatory Visit: Payer: Medicare PPO | Admitting: Internal Medicine

## 2021-02-09 ENCOUNTER — Telehealth: Payer: Self-pay | Admitting: Cardiology

## 2021-02-09 NOTE — Telephone Encounter (Signed)
Received outpatient call regarding elevated BP and HR. Pt is not followed by [facility], reported he was attempting to get in touch with his normal cardiology and friend gave him [facility] number. Advised he would need to speak with his cardiology group, in mid conversation call was ended.

## 2021-02-13 DIAGNOSIS — M25562 Pain in left knee: Secondary | ICD-10-CM | POA: Diagnosis not present

## 2021-02-14 ENCOUNTER — Ambulatory Visit: Payer: Medicare PPO | Admitting: Internal Medicine

## 2021-02-17 ENCOUNTER — Other Ambulatory Visit: Payer: Self-pay | Admitting: Internal Medicine

## 2021-02-17 NOTE — Telephone Encounter (Signed)
Please refill as per office routine med refill policy (all routine meds to be refilled for 3 mo or monthly (per pt preference) up to one year from last visit, then month to month grace period for 3 mo, then further med refills will have to be denied) ? ?

## 2021-02-18 ENCOUNTER — Encounter: Payer: Self-pay | Admitting: Internal Medicine

## 2021-02-18 ENCOUNTER — Other Ambulatory Visit: Payer: Self-pay

## 2021-02-18 ENCOUNTER — Ambulatory Visit (INDEPENDENT_AMBULATORY_CARE_PROVIDER_SITE_OTHER): Payer: Medicare PPO | Admitting: Internal Medicine

## 2021-02-18 VITALS — BP 124/80 | HR 54 | Temp 98.3°F | Ht 72.0 in | Wt 203.0 lb

## 2021-02-18 DIAGNOSIS — E1140 Type 2 diabetes mellitus with diabetic neuropathy, unspecified: Secondary | ICD-10-CM

## 2021-02-18 DIAGNOSIS — E559 Vitamin D deficiency, unspecified: Secondary | ICD-10-CM

## 2021-02-18 DIAGNOSIS — I10 Essential (primary) hypertension: Secondary | ICD-10-CM

## 2021-02-18 DIAGNOSIS — E538 Deficiency of other specified B group vitamins: Secondary | ICD-10-CM

## 2021-02-18 DIAGNOSIS — E7800 Pure hypercholesterolemia, unspecified: Secondary | ICD-10-CM

## 2021-02-18 DIAGNOSIS — K219 Gastro-esophageal reflux disease without esophagitis: Secondary | ICD-10-CM | POA: Diagnosis not present

## 2021-02-18 DIAGNOSIS — E890 Postprocedural hypothyroidism: Secondary | ICD-10-CM

## 2021-02-18 LAB — URINALYSIS, ROUTINE W REFLEX MICROSCOPIC
Bilirubin Urine: NEGATIVE
Hgb urine dipstick: NEGATIVE
Leukocytes,Ua: NEGATIVE
Nitrite: NEGATIVE
RBC / HPF: NONE SEEN (ref 0–?)
Specific Gravity, Urine: >=1.03 — AB (ref 1.000–1.030)
Total Protein, Urine: NEGATIVE
Urine Glucose: NEGATIVE
Urobilinogen, UA: 0.2 (ref 0.0–1.0)
pH: 5.5 (ref 5.0–8.0)

## 2021-02-18 LAB — BASIC METABOLIC PANEL
BUN: 29 mg/dL — ABNORMAL HIGH (ref 6–23)
CO2: 26 mEq/L (ref 19–32)
Calcium: 9.2 mg/dL (ref 8.4–10.5)
Chloride: 106 mEq/L (ref 96–112)
Creatinine, Ser: 1.64 mg/dL — ABNORMAL HIGH (ref 0.40–1.50)
GFR: 38.7 mL/min — ABNORMAL LOW (ref >60.00)
Glucose, Bld: 99 mg/dL (ref 70–99)
Potassium: 3.9 mEq/L (ref 3.5–5.1)
Sodium: 140 mEq/L (ref 135–145)

## 2021-02-18 LAB — HEMOGLOBIN A1C: Hgb A1c MFr Bld: 6.2 % (ref 4.6–6.5)

## 2021-02-18 LAB — CBC WITH DIFFERENTIAL/PLATELET
Basophils Absolute: 0.1 10*3/uL (ref 0.0–0.1)
Basophils Relative: 1.1 % (ref 0.0–3.0)
Eosinophils Absolute: 0.3 10*3/uL (ref 0.0–0.7)
Eosinophils Relative: 4.1 % (ref 0.0–5.0)
HCT: 37.8 % — ABNORMAL LOW (ref 39.0–52.0)
Hemoglobin: 12.5 g/dL — ABNORMAL LOW (ref 13.0–17.0)
Lymphocytes Relative: 19.4 % (ref 12.0–46.0)
Lymphs Abs: 1.3 10*3/uL (ref 0.7–4.0)
MCHC: 33.1 g/dL (ref 30.0–36.0)
MCV: 90.4 fl (ref 78.0–100.0)
Monocytes Absolute: 0.4 10*3/uL (ref 0.1–1.0)
Monocytes Relative: 6.1 % (ref 3.0–12.0)
Neutro Abs: 4.6 10*3/uL (ref 1.4–7.7)
Neutrophils Relative %: 69.3 % (ref 43.0–77.0)
Platelets: 266 10*3/uL (ref 150.0–400.0)
RBC: 4.18 Mil/uL — ABNORMAL LOW (ref 4.22–5.81)
RDW: 14.9 % (ref 11.5–15.5)
WBC: 6.7 10*3/uL (ref 4.0–10.5)

## 2021-02-18 LAB — MICROALBUMIN / CREATININE URINE RATIO
Creatinine,U: 123.6 mg/dL
Microalb Creat Ratio: 0.6 mg/g (ref 0.0–30.0)
Microalb, Ur: <0.7 mg/dL (ref 0.0–1.9)

## 2021-02-18 LAB — T4, FREE: Free T4: 1.03 ng/dL (ref 0.60–1.60)

## 2021-02-18 LAB — LIPID PANEL
Cholesterol: 131 mg/dL (ref 0–200)
HDL: 52.8 mg/dL (ref >39.00)
LDL Cholesterol: 47 mg/dL (ref 0–99)
NonHDL: 78.34
Total CHOL/HDL Ratio: 2
Triglycerides: 158 mg/dL — ABNORMAL HIGH (ref 0.0–149.0)
VLDL: 31.6 mg/dL (ref 0.0–40.0)

## 2021-02-18 LAB — HEPATIC FUNCTION PANEL
ALT: 17 U/L (ref 0–53)
AST: 22 U/L (ref 0–37)
Albumin: 3.9 g/dL (ref 3.5–5.2)
Alkaline Phosphatase: 56 U/L (ref 39–117)
Bilirubin, Direct: 0.2 mg/dL (ref 0.0–0.3)
Total Bilirubin: 0.8 mg/dL (ref 0.2–1.2)
Total Protein: 6.5 g/dL (ref 6.0–8.3)

## 2021-02-18 LAB — VITAMIN B12: Vitamin B-12: 1265 pg/mL — ABNORMAL HIGH (ref 211–911)

## 2021-02-18 LAB — TSH: TSH: 7.23 u[IU]/mL — ABNORMAL HIGH (ref 0.35–5.50)

## 2021-02-18 LAB — VITAMIN D 25 HYDROXY (VIT D DEFICIENCY, FRACTURES): VITD: 62.4 ng/mL (ref 30.00–100.00)

## 2021-02-18 NOTE — Progress Notes (Signed)
Patient ID: Nathan Russo, male   DOB: October 13, 1940, 80 y.o.   MRN: [id number]        Chief Complaint: follow up HTN, HLD and hyperglycemia, low thyroid, reflux       HPI:  Nathan Russo is a 80 y.o. male here overall doing ok after recent UTI  - Denies urinary symptoms such as dysuria, frequency, urgency, flank pain, hematuria or n/v, fever, chills.  Has had mild worsening reflux, but no abd pain, dysphagia, n/v, bowel change or blood, despite PPI bid.  Had thyroid med decreased too 112 recently, Denies hyper or hypo thyroid symptoms such as voice, skin or hair change, asks for f/u lab.  Pt denies chest pain, increased sob or doe, wheezing, orthopnea, PND, increased LE swelling, palpitations, dizziness or syncope.   Pt denies polydipsia, polyuria, or new focal neuro s/s.  Pt denies fever, wt loss, night sweats, loss of appetite, or other constitutional symptoms        Wt Readings from Last 3 Encounters:  02/18/21 203 lb (92.1 kg)  11/15/20 196 lb (88.9 kg)  08/07/20 190 lb 6.4 oz (86.4 kg)   BP Readings from Last 3 Encounters:  02/18/21 124/80  11/15/20 (!) 144/68  08/07/20 124/68         Past Medical History:  Diagnosis Date   Abdominal pain, epigastric 04/11/2010   BELCHING 04/23/2010   BRADYCARDIA, CHRONIC 10/24/2006   CARPAL TUNNEL SYNDROME, BILATERAL 02/07/2009   CHEST PAIN-UNSPECIFIED 09/05/2008   CHOLELITHIASIS 04/22/2010   Cholelithiasis 06/13/2010   COLONIC POLYPS, HX OF 04/28/2007   DEGENERATIVE JOINT DISEASE, RIGHT KNEE 10/24/2006   Depression 02/23/2011   DIABETES MELLITUS, TYPE II 04/28/2007   Dizziness and giddiness 12/19/2009   Elevated PSA 06/13/2010   FATIGUE 04/28/2007   GERD 02/11/2009   Headache(784.0) 12/19/2009   HYPERLIPIDEMIA 04/28/2007   HYPERTENSION 10/21/2006   NECK MASS 02/07/2010   OBESITY 10/24/2006   OTITIS MEDIA, ACUTE, LEFT 12/26/2009   PHIMOSIS 02/07/2009   S/P laparoscopic cholecystectomy 10/31/2010   Thyroid cancer ([place]) 06/13/2010   THYROID NODULE 02/07/2010    Past Surgical History:  Procedure Laterality Date   CHOLECYSTECTOMY     left knee surgery     right wrist surgury     THYROID SURGERY      reports that he has quit smoking. He has never used smokeless tobacco. He reports that he does not drink alcohol and does not use drugs. family history includes Arthritis in his mother; Cancer in his brother and sister; Colon cancer (age of onset: 47) in his brother; Diabetes in his brother; Goiter in his mother; Heart attack (age of onset: 67) in his brother; Heart attack (age of onset: 32) in his father; Hypertension in his mother; Hypothyroidism in his sister. Allergies  Allergen Reactions   Diphenoxylate-Atropine Other (See Comments)   Other Other (See Comments) and Hives    Causes body to ache Causes body to ache   Sulfa Antibiotics Other (See Comments)    As child almost died   Ace Inhibitors     REACTION: cough   Atenolol     REACTION: bradycardia   Codeine Other (See Comments)    Head spins "wild"   Levaquin [Levofloxacin]     Interferes with flecainide   Lovastatin     REACTION: myalygros   Morphine Nausea And Vomiting   Morphine And Related Other (See Comments)   Statins Other (See Comments)    Causes body to ache   Sulfamethoxazole  Other (See Comments)    Childhood unknown reaction   Sulfasalazine Other (See Comments)    As child almost died   Tramadol     Ants crawling all over   Current Outpatient Medications on File Prior to Visit  Medication Sig Dispense Refill   Accu-Chek Softclix Lancets lancets Use to check blood sugars twice a day 100 each 5   amiodarone (PACERONE) 200 MG tablet Take by mouth.     apixaban (ELIQUIS) 2.5 MG TABS tablet Take 1 tablet (2.5 mg total) by mouth 2 (two) times daily. 180 tablet 3   Ascorbic Acid (VITAMIN C) 1000 MG tablet Take 1,000 mg by mouth daily.     augmented betamethasone dipropionate (DIPROLENE-AF) 0.05 % ointment betamethasone, augmented 0.05 % topical ointment  APPLY OINTMENT  TOPICALLY TO EACH EAR AS NEEDED FOR ITCHING     blood glucose meter kit and supplies KIT Dispense based on patient and insurance preference. Use up to four times daily as directed. (FOR ICD-9 250.00, 250.01). 1 each 0   Blood Glucose Monitoring Suppl Supplies MISC Use 1 strip as directed three times daily E11.9 300 each 3   Calcium Carb-Cholecalciferol [phone] MG-UNIT CAPS Take by mouth.     Cinnamon 500 MG capsule Take 500 mg by mouth 2 (two) times daily.     cyanocobalamin 1000 MCG tablet Take by mouth.     doxycycline (VIBRA-TABS) 100 MG tablet      ezetimibe (ZETIA) 10 MG tablet      Flaxseed, Linseed, 1000 MG CAPS Take 1 capsule by mouth daily.     Garlic Oil 841 MG TABS Take by mouth 2 (two) times daily.     Ginger, Zingiber officinalis, (GINGER PO) Take by mouth 4 (four) times daily.     Glucosamine-Chondroit-Vit C-Mn (GLUCOSAMINE CHONDROITIN COMPLX) CAPS Take by mouth daily.     glucose blood (ACCU-CHEK GUIDE) test strip USE TO CHECK BLOOD SUGAR 2 TO 3 TIMES DAILY 100 each 0   isosorbide mononitrate (IMDUR) 60 MG 24 hr tablet Take by mouth.     levothyroxine (SYNTHROID) 112 MCG tablet Take 1 tablet (112 mcg total) by mouth daily. 90 tablet 3   losartan (COZAAR) 50 MG tablet      metoprolol succinate (TOPROL-XL) 25 MG 24 hr tablet      Multiple Vitamin (MULTIVITAMIN) capsule Take 1 capsule by mouth daily.     nitroGLYCERIN (NITROSTAT) 0.4 MG SL tablet      Omega-3 Fatty Acids (FISH OIL) 1000 MG CAPS Take by mouth 2 (two) times daily.     pantoprazole (PROTONIX) 40 MG tablet Take 2 tablets by mouth once daily 180 tablet 3   prasugrel (EFFIENT) 10 MG TABS tablet Take 10 mg by mouth daily.     promethazine-dextromethorphan (PROMETHAZINE-DM) 6.25-15 MG/5ML syrup      VITAMIN D, CHOLECALCIFEROL, PO Take by mouth daily.     Diclofenac Sodium (PENNSAID) 2 % SOLN Pennsaid 20 mg/gram/actuation (2 %) topical soln in metered-dose pump  APPLY 2 PUMPS (40 MG) TO THE AFFECTED KNEE BY TOPICAL ROUTE  2 TIMES PER DAY     ketoconazole (NIZORAL) 2 % cream ketoconazole 2 % topical cream  APPLY CREAM TOPICALLY ONCE DAILY FOR 30 DAYS     metFORMIN (GLUCOPHAGE-XR) 500 MG 24 hr tablet Take 1 tablet by mouth twice daily 180 tablet 0   No current facility-administered medications on file prior to visit.        ROS:  All others reviewed and negative.  Objective        PE:  BP 124/80 (BP Location: Left Arm, Patient Position: Sitting, Cuff Size: Normal)   Pulse (!) 54   Temp 98.3 F (36.8 C) (Oral)   Ht 6' (1.829 m)   Wt 203 lb (92.1 kg)   SpO2 93%   BMI 27.53 kg/m                 Constitutional: Pt appears in NAD               HENT: Head: NCAT.                Right Ear: External ear normal.                 Left Ear: External ear normal.                Eyes: . Pupils are equal, round, and reactive to light. Conjunctivae and EOM are normal               Nose: without d/c or deformity               Neck: Neck supple. Gross normal ROM               Cardiovascular: Normal rate and regular rhythm.                 Pulmonary/Chest: Effort normal and breath sounds without rales or wheezing.                Abd:  Soft, NT, ND, + BS, no organomegaly               Neurological: Pt is alert. At baseline orientation, motor grossly intact               Skin: Skin is warm. No rashes, no other new lesions, LE edema - trace pedal bilateral               Psychiatric: Pt behavior is normal without agitation   Micro: none  Cardiac tracings I have personally interpreted today:  none  Pertinent Radiological findings (summarize): none   Lab Results  Component Value Date   WBC 6.7 02/18/2021   HGB 12.5 (L) 02/18/2021   HCT 37.8 (L) 02/18/2021   PLT 266.0 02/18/2021   GLUCOSE 99 02/18/2021   CHOL 131 02/18/2021   TRIG 158.0 (H) 02/18/2021   HDL 52.80 02/18/2021   LDLDIRECT 122.0 05/08/2017   LDLCALC 47 02/18/2021   ALT 17 02/18/2021   AST 22 02/18/2021   NA 140 02/18/2021   K 3.9 02/18/2021    CL 106 02/18/2021   CREATININE 1.64 (H) 02/18/2021   BUN 29 (H) 02/18/2021   CO2 26 02/18/2021   TSH 7.23 (H) 02/18/2021   PSA 3.59 05/08/2017   INR 1.0 08/22/2008   HGBA1C 6.2 02/18/2021   MICROALBUR <0.7 02/18/2021   Assessment/Plan:  Nathan Russo is a 80 y.o. White or Caucasian [1] male with  has a past medical history of Abdominal pain, epigastric (04/11/2010), BELCHING (04/23/2010), BRADYCARDIA, CHRONIC (10/24/2006), CARPAL TUNNEL SYNDROME, BILATERAL (02/07/2009), CHEST PAIN-UNSPECIFIED (09/05/2008), CHOLELITHIASIS (04/22/2010), Cholelithiasis (06/13/2010), COLONIC POLYPS, HX OF (04/28/2007), DEGENERATIVE JOINT DISEASE, RIGHT KNEE (10/24/2006), Depression (02/23/2011), DIABETES MELLITUS, TYPE II (04/28/2007), Dizziness and giddiness (12/19/2009), Elevated PSA (06/13/2010), FATIGUE (04/28/2007), GERD (02/11/2009), Headache(784.0) (12/19/2009), HYPERLIPIDEMIA (04/28/2007), HYPERTENSION (10/21/2006), NECK MASS (02/07/2010), OBESITY (10/24/2006), OTITIS MEDIA, ACUTE, LEFT (12/26/2009), PHIMOSIS (02/07/2009), S/P laparoscopic cholecystectomy (10/31/2010), Thyroid cancer ([place]) (06/13/2010),  and THYROID NODULE (02/07/2010).  Diabetes mellitus ([place]) Lab Results  Component Value Date   HGBA1C 6.2 02/18/2021   Stable, pt to continue current medical treatment metformin   Hyperlipidemia Lab Results  Component Value Date   LDLCALC 47 02/18/2021   Stable, pt to continue current zetia   Gastroesophageal reflux disease Mild uncontrolled, to add pepcid asd,  to f/u any worsening symptoms or concerns  Essential hypertension BP Readings from Last 3 Encounters:  02/18/21 124/80  11/15/20 (!) 144/68  08/07/20 124/68   Stable, pt to continue medical treatment losartan, toprol   Postoperative hypothyroidism Stable after recent decreased dose to 112 mcg - for f/u lab today  Followup: Return in about 6 months (around 08/19/2021).  Cathlean Cower, MD 02/20/2021 4:57 AM [facility]

## 2021-02-18 NOTE — Patient Instructions (Signed)

## 2021-02-19 DIAGNOSIS — I499 Cardiac arrhythmia, unspecified: Secondary | ICD-10-CM | POA: Diagnosis not present

## 2021-02-19 DIAGNOSIS — I2510 Atherosclerotic heart disease of native coronary artery without angina pectoris: Secondary | ICD-10-CM | POA: Diagnosis not present

## 2021-02-19 DIAGNOSIS — I10 Essential (primary) hypertension: Secondary | ICD-10-CM | POA: Diagnosis not present

## 2021-02-19 DIAGNOSIS — E118 Type 2 diabetes mellitus with unspecified complications: Secondary | ICD-10-CM | POA: Diagnosis not present

## 2021-02-19 DIAGNOSIS — R0789 Other chest pain: Secondary | ICD-10-CM | POA: Diagnosis not present

## 2021-02-19 DIAGNOSIS — I480 Paroxysmal atrial fibrillation: Secondary | ICD-10-CM | POA: Diagnosis not present

## 2021-02-20 ENCOUNTER — Encounter: Payer: Self-pay | Admitting: Internal Medicine

## 2021-02-20 MED ORDER — FAMOTIDINE 20 MG PO TABS: 20.0000 mg | ORAL_TABLET | Freq: Two times a day (BID) | ORAL | 3 refills | Status: DC

## 2021-02-20 NOTE — Assessment & Plan Note (Signed)
Lab Results  Component Value Date   LDLCALC 47 02/18/2021   Stable, pt to continue current zetia

## 2021-02-20 NOTE — Assessment & Plan Note (Signed)
BP Readings from Last 3 Encounters:  02/18/21 124/80  11/15/20 (!) 144/68  08/07/20 124/68   Stable, pt to continue medical treatment losartan, toprol

## 2021-02-20 NOTE — Assessment & Plan Note (Signed)
Mild uncontrolled, to add pepcid asd,  to f/u any worsening symptoms or concerns

## 2021-02-20 NOTE — Assessment & Plan Note (Signed)
Lab Results  Component Value Date   HGBA1C 6.2 02/18/2021   Stable, pt to continue current medical treatment metformin

## 2021-02-20 NOTE — Assessment & Plan Note (Signed)
Stable after recent decreased dose to 112 mcg - for f/u lab today

## 2021-03-22 ENCOUNTER — Other Ambulatory Visit: Payer: Self-pay

## 2021-03-22 ENCOUNTER — Ambulatory Visit (INDEPENDENT_AMBULATORY_CARE_PROVIDER_SITE_OTHER): Payer: Medicare (Managed Care)

## 2021-03-22 DIAGNOSIS — Z0000 Encounter for general adult medical examination without abnormal findings: Secondary | ICD-10-CM | POA: Diagnosis not present

## 2021-03-22 NOTE — Progress Notes (Signed)
I connected with Nathan Russo today by telephone and verified that I am speaking with the correct person using two identifiers. Location patient: home Location provider: work Persons participating in the virtual visit: patient, provider.   I discussed the limitations, risks, security and privacy concerns of performing an evaluation and management service by telephone and the availability of in person appointments. I also discussed with the patient that there may be a patient responsible charge related to this service. The patient expressed understanding and verbally consented to this telephonic visit.    Interactive audio and video telecommunications were attempted between this provider and patient, however failed, due to patient having technical difficulties OR patient did not have access to video capability.  We continued and completed visit with audio only.  Some vital signs may be absent or patient reported.   Time Spent with patient on telephone encounter: 40 minutes  Subjective:   Nathan Russo is a 81 y.o. male who presents for Medicare Annual/Subsequent preventive examination.  Review of Systems     Cardiac Risk Factors include: advanced age (>57mn, >>48women);diabetes mellitus;dyslipidemia;family history of premature cardiovascular disease;hypertension;male gender     Objective:    There were no vitals filed for this visit. There is no height or weight on file to calculate BMI.  Advanced Directives 03/22/2021 11/02/2019 10/19/2018 10/09/2017 05/21/2016 05/10/2015  Does Patient Have a Medical Advance Directive? Yes Yes Yes Yes Yes Yes  Type of Advance Directive Living will;Healthcare Power of Attorney Living will;Healthcare Power of [place] -  Does patient want to make changes to medical advance directive? No - Patient declined No - Patient declined - - - -  Copy  of [place] Chart? No - copy requested No - copy requested No - copy requested No - copy requested No - copy requested No - copy requested    Current Medications (verified) Outpatient Encounter Medications as of 03/22/2021  Medication Sig   Accu-Chek Softclix Lancets lancets Use to check blood sugars twice a day   amiodarone (PACERONE) 200 MG tablet Take by mouth.   apixaban (ELIQUIS) 2.5 MG TABS tablet Take 1 tablet (2.5 mg total) by mouth 2 (two) times daily.   Ascorbic Acid (VITAMIN C) 1000 MG tablet Take 1,000 mg by mouth daily.   augmented betamethasone dipropionate (DIPROLENE-AF) 0.05 % ointment betamethasone, augmented 0.05 % topical ointment  APPLY OINTMENT TOPICALLY TO EACH EAR AS NEEDED FOR ITCHING   blood glucose meter kit and supplies KIT Dispense based on patient and insurance preference. Use up to four times daily as directed. (FOR ICD-9 250.00, 250.01).   Blood Glucose Monitoring Suppl Supplies MISC Use 1 strip as directed three times daily E11.9   Calcium Carb-Cholecalciferol [phone] MG-UNIT CAPS Take by mouth.   Cinnamon 500 MG capsule Take 500 mg by mouth 2 (two) times daily.   cyanocobalamin 1000 MCG tablet Take by mouth.   Diclofenac Sodium (PENNSAID) 2 % SOLN [place]aid 20 mg/gram/actuation (2 %) topical soln in metered-dose pump  APPLY 2 PUMPS (40 MG) TO THE AFFECTED KNEE BY TOPICAL ROUTE 2 TIMES PER DAY   doxycycline (VIBRA-TABS) 100 MG tablet    ezetimibe (ZETIA) 10 MG tablet    famotidine (PEPCID) 20 MG tablet Take 1 tablet (20 mg total) by mouth 2 (two) times daily.   Flaxseed, Linseed, 1000 MG CAPS Take 1 capsule by mouth daily.   Garlic Oil  500 MG TABS Take by mouth 2 (two) times daily.   Ginger, Zingiber officinalis, (GINGER PO) Take by mouth 4 (four) times daily.   Glucosamine-Chondroit-Vit C-Mn (GLUCOSAMINE CHONDROITIN COMPLX) CAPS Take by mouth daily.   glucose blood (ACCU-CHEK GUIDE) test strip USE TO CHECK BLOOD SUGAR 2 TO 3 TIMES DAILY    isosorbide mononitrate (IMDUR) 60 MG 24 hr tablet Take by mouth.   ketoconazole (NIZORAL) 2 % cream ketoconazole 2 % topical cream  APPLY CREAM TOPICALLY ONCE DAILY FOR 30 DAYS   levothyroxine (SYNTHROID) 112 MCG tablet Take 1 tablet (112 mcg total) by mouth daily.   losartan (COZAAR) 50 MG tablet    metFORMIN (GLUCOPHAGE-XR) 500 MG 24 hr tablet Take 1 tablet by mouth twice daily   metoprolol succinate (TOPROL-XL) 25 MG 24 hr tablet    Multiple Vitamin (MULTIVITAMIN) capsule Take 1 capsule by mouth daily.   nitroGLYCERIN (NITROSTAT) 0.4 MG SL tablet    Omega-3 Fatty Acids (FISH OIL) 1000 MG CAPS Take by mouth 2 (two) times daily.   pantoprazole (PROTONIX) 40 MG tablet Take 2 tablets by mouth once daily   prasugrel (EFFIENT) 10 MG TABS tablet Take 10 mg by mouth daily.   promethazine-dextromethorphan (PROMETHAZINE-DM) 6.25-15 MG/5ML syrup    VITAMIN D, CHOLECALCIFEROL, PO Take by mouth daily.   No facility-administered encounter medications on file as of 03/22/2021.    Allergies (verified) Diphenoxylate-atropine, Other, Sulfa antibiotics, Ace inhibitors, Atenolol, Codeine, Levaquin [levofloxacin], Lovastatin, Morphine, Morphine and related, Statins, Sulfamethoxazole, Sulfasalazine, and Tramadol   History: Past Medical History:  Diagnosis Date   Abdominal pain, epigastric 04/11/2010   BELCHING 04/23/2010   BRADYCARDIA, CHRONIC 10/24/2006   CARPAL TUNNEL SYNDROME, BILATERAL 02/07/2009   CHEST PAIN-UNSPECIFIED 09/05/2008   CHOLELITHIASIS 04/22/2010   Cholelithiasis 06/13/2010   COLONIC POLYPS, HX OF 04/28/2007   DEGENERATIVE JOINT DISEASE, RIGHT KNEE 10/24/2006   Depression 02/23/2011   DIABETES MELLITUS, TYPE II 04/28/2007   Dizziness and giddiness 12/19/2009   Elevated PSA 06/13/2010   FATIGUE 04/28/2007   GERD 02/11/2009   Headache(784.0) 12/19/2009   HYPERLIPIDEMIA 04/28/2007   HYPERTENSION 10/21/2006   NECK MASS 02/07/2010   OBESITY 10/24/2006   OTITIS MEDIA, ACUTE, LEFT 12/26/2009    PHIMOSIS 02/07/2009   S/P laparoscopic cholecystectomy 10/31/2010   Thyroid cancer ([place]) 06/13/2010   THYROID NODULE 02/07/2010   Past Surgical History:  Procedure Laterality Date   CHOLECYSTECTOMY     left knee surgery     right wrist surgury     THYROID SURGERY     Family History  Problem Relation Age of Onset   Heart attack Brother 8   Diabetes Brother    Cancer Brother        colon   Colon cancer Brother 37   Hypertension Mother    Arthritis Mother    Goiter Mother    Heart attack Father 93   Cancer Sister        breast   Hypothyroidism Sister    Social History   Socioeconomic History   Marital status: Married    Spouse name: Not on file   Number of children: 3   Years of education: Not on file   Highest education level: Not on file  Occupational History   Occupation: former Programmer, systems: RETIRED  Tobacco Use   Smoking status: Former   Smokeless tobacco: Never  Scientific laboratory technician Use: Never used  Substance and Sexual Activity   Alcohol use:  No    Alcohol/week: 0.0 standard drinks   Drug use: No   Sexual activity: Not Currently  Other Topics Concern   Not on file  Social History Narrative   Not on file   Social Determinants of Health   Financial Resource Strain: Low Risk    Difficulty of Paying Living Expenses: Not hard at all  Food Insecurity: No Food Insecurity   Worried About Charity fundraiser in the Last Year: Never true   [place] in the Last Year: Never true  Transportation Needs: No Transportation Needs   Lack of Transportation (Medical): No   Lack of Transportation (Non-Medical): No  Physical Activity: Sufficiently Active   Days of Exercise per Week: 7 days   Minutes of Exercise per Session: 60 min  Stress: No Stress Concern Present   Feeling of Stress : Not at all  Social Connections: Socially Integrated   Frequency of Communication with Friends and Family: More than three times a week    Frequency of Social Gatherings with Friends and Family: More than three times a week   Attends Religious Services: More than 4 times per year   Active Member of [employer] or Organizations: Yes   Attends Music therapist: More than 4 times per year   Marital Status: Married    Tobacco Counseling Counseling given: Not Answered   Clinical Intake:  Pre-visit preparation completed: Yes  Pain : No/denies pain     Nutritional Risks: None Diabetes: Yes CBG done?: No Did pt. bring in CBG monitor from home?: No  How often do you need to have someone help you when you read instructions, pamphlets, or other written materials from your doctor or pharmacy?: 1 - Never What is the last grade level you completed in school?: [employer] at [employer]  Diabetic? yes  Interpreter Needed?: No  Information entered by :: Lisette Abu, LPN   Activities of Daily Living In your present state of health, do you have any difficulty performing the following activities: 03/22/2021 11/15/2020  Hearing? N N  Vision? N Y  Difficulty concentrating or making decisions? N N  Walking or climbing stairs? N Y  Dressing or bathing? N N  Doing errands, shopping? N N  Preparing Food and eating ? N -  Using the Toilet? N -  In the past six months, have you accidently leaked urine? N -  Do you have problems with loss of bowel control? N -  Managing your Medications? N -  Managing your Finances? N -  Housekeeping or managing your Housekeeping? N -  Some recent data might be hidden    Patient Care Team: Biagio Borg, MD as PCP - General Philomena Doheny, MD as Referring Physician (Plastic Surgery) Ebbie Ridge, MD as Referring Physician (Cardiology) Poudyal, Ritesh, OD (Optometry)  Indicate any recent Medical Services you may have received from other than [facility] providers in the past year (date may be approximate).     Assessment:   This is a routine wellness  examination for Ildefonso.  Hearing/Vision screen Hearing Screening - Comments:: Patient denied any hearing difficulty.   No hearing aids.  Vision Screening - Comments:: Patient wears corrective glasses/contacts.  Eye exam done annually by: Dr. Janine Ores, OD.   Dietary issues and exercise activities discussed: Current Exercise Habits: Home exercise routine, Type of exercise: walking;treadmill;Other - see comments (recombent bike), Time (Minutes): 60, Frequency (Times/Week): 5, Weekly Exercise (Minutes/Week): 300, Intensity:  Moderate, Exercise limited by: cardiac condition(s);orthopedic condition(s)   Goals Addressed               This Visit's Progress     Patient Stated (pt-stated)        Patient declined health goal at this time.      Depression Screen PHQ 2/9 Scores 03/22/2021 11/15/2020 11/02/2019 10/19/2018 05/13/2018 10/09/2017 05/08/2017  PHQ - 2 Score 0 0 0 0 0 0 0    Fall Risk Fall Risk  03/22/2021 11/15/2020 08/07/2020 02/07/2020 11/02/2019  Falls in the past year? '1 1 1 1 1  ' Number falls in past yr: 1 1 0 1 0  Comment - - - - -  Injury with Fall? 0 1 0 1 1  Comment - - - - -  Risk for fall due to : History of fall(s) - - Impaired balance/gait History of fall(s)  Follow up Falls evaluation completed - - Falls evaluation completed Falls evaluation completed    FALL RISK PREVENTION PERTAINING TO THE HOME:  Any stairs in or around the home? Yes  If so, are there any without handrails? No  Home free of loose throw rugs in walkways, pet beds, electrical cords, etc? Yes  Adequate lighting in your home to reduce risk of falls? Yes   ASSISTIVE DEVICES UTILIZED TO PREVENT FALLS:  Life alert? No  Use of a cane, walker or w/c? No  Grab bars in the bathroom? Yes  Shower chair or bench in shower? Yes  Elevated toilet seat or a handicapped toilet? Yes   TIMED UP AND GO:  Was the test performed? No .  Length of time to ambulate 10 feet: n/a sec.   Gait steady and fast without  use of assistive device  Cognitive Function: Normal cognitive status assessed by direct observation by this Nurse Health Advisor. No abnormalities found.          Immunizations Immunization History  Administered Date(s) Administered   Fluad Quad(high Dose 65+) 12/13/2018, 02/07/2020   Influenza Split 12/13/2010   Influenza Whole 12/09/2007, 12/13/2009   Influenza, High Dose Seasonal PF 12/10/2014, 11/04/2016, 11/10/2017   Influenza, Seasonal, Injecte, Preservative Fre 12/08/2012   Influenza,inj,Quad PF,6+ Mos 11/07/2015   PFIZER(Purple Top)SARS-COV-2 Vaccination 04/03/2019, 05/31/2019   Pneumococcal Conjugate-13 03/22/2013   Pneumococcal Polysaccharide-23 01/08/2006, 03/25/2011   Td 05/17/2009   Tdap 02/07/2020    TDAP status: Up to date  Flu Vaccine status: Up to date  Pneumococcal vaccine status: Up to date  Covid-19 vaccine status: Completed vaccines  Qualifies for Shingles Vaccine? Yes   Zostavax completed No   Shingrix Completed?: No.    Education has been provided regarding the importance of this vaccine. Patient has been advised to call insurance company to determine out of pocket expense if they have not yet received this vaccine. Advised may also receive vaccine at local pharmacy or Health Dept. Verbalized acceptance and understanding.  Screening Tests Health Maintenance  Topic Date Due   Zoster Vaccines- Shingrix (1 of 2) Never done   COVID-19 Vaccine (3 - Pfizer risk series) 06/28/2019   INFLUENZA VACCINE  10/08/2020   OPHTHALMOLOGY EXAM  01/01/2021   FOOT EXAM  08/07/2021   TETANUS/TDAP  02/06/2030   Pneumonia Vaccine 63+ Years old  Completed   HPV VACCINES  Aged Out    Health Maintenance  Health Maintenance Due  Topic Date Due   Zoster Vaccines- Shingrix (1 of 2) Never done   COVID-19 Vaccine (3 - Pfizer risk series) 06/28/2019  INFLUENZA VACCINE  10/08/2020   OPHTHALMOLOGY EXAM  01/01/2021    Colorectal cancer screening: No longer required.    Lung Cancer Screening: (Low Dose CT Chest recommended if Age 29-80 years, 30 pack-year currently smoking OR have quit w/in 15years.) does not qualify.   Lung Cancer Screening Referral: no  Additional Screening:  Hepatitis C Screening: does not qualify; Completed no  Vision Screening: Recommended annual ophthalmology exams for early detection of glaucoma and other disorders of the eye. Is the patient up to date with their annual eye exam?  Yes  Who is the provider or what is the name of the office in which the patient attends annual eye exams? Dr. Janine Ores, OD. If pt is not established with a provider, would they like to be referred to a provider to establish care? No .   Dental Screening: Recommended annual dental exams for proper oral hygiene  Community Resource Referral / Chronic Care Management: CRR required this visit?  No   CCM required this visit?  No      Plan:     I have personally reviewed and noted the following in the patients chart:   Medical and social history Use of alcohol, tobacco or illicit drugs  Current medications and supplements including opioid prescriptions. Patient is not currently taking opioid prescriptions. Functional ability and status Nutritional status Physical activity Advanced directives List of other physicians Hospitalizations, surgeries, and ER visits in previous 12 months Vitals Screenings to include cognitive, depression, and falls Referrals and appointments  In addition, I have reviewed and discussed with patient certain preventive protocols, quality metrics, and best practice recommendations. A written personalized care plan for preventive services as well as general preventive health recommendations were provided to patient.     Sheral Flow, LPN   04/06/1186   Nurse Notes:  Patient is cogitatively intact. There were no vitals filed for this visit. There is no height or weight on file to calculate BMI.

## 2021-05-04 ENCOUNTER — Other Ambulatory Visit: Payer: Self-pay | Admitting: Internal Medicine

## 2021-05-04 NOTE — Telephone Encounter (Signed)
Please refill as per office routine med refill policy (all routine meds to be refilled for 3 mo or monthly (per pt preference) up to one year from last visit, then month to month grace period for 3 mo, then further med refills will have to be denied) ? ?

## 2021-07-23 ENCOUNTER — Other Ambulatory Visit: Payer: Self-pay | Admitting: Internal Medicine

## 2021-07-23 NOTE — Telephone Encounter (Signed)
Please refill as per office routine med refill policy (all routine meds to be refilled for 3 mo or monthly (per pt preference) up to one year from last visit, then month to month grace period for 3 mo, then further med refills will have to be denied) ? ?

## 2021-09-12 ENCOUNTER — Other Ambulatory Visit: Payer: Self-pay | Admitting: Internal Medicine

## 2021-09-12 NOTE — Telephone Encounter (Signed)
Please refill as per office routine med refill policy (all routine meds to be refilled for 3 mo or monthly (per pt preference) up to one year from last visit, then month to month grace period for 3 mo, then further med refills will have to be denied) ? ?

## 2021-10-03 ENCOUNTER — Telehealth: Payer: Self-pay | Admitting: Internal Medicine

## 2021-10-03 DIAGNOSIS — L570 Actinic keratosis: Secondary | ICD-10-CM | POA: Diagnosis not present

## 2021-10-03 NOTE — Telephone Encounter (Signed)
Pt has switched cars and is needing the forms filled out so he can get new handicap licence plate.    Please advise when paperwork is ready for pickup. [phone]

## 2021-10-08 NOTE — Telephone Encounter (Signed)
Do you have these forms? If not, let me know what I can do.

## 2021-10-09 NOTE — Telephone Encounter (Signed)
Nathan Russo - I don't see where this has been done - can you call the patient - please advise - patient would like a phone call please

## 2021-10-10 NOTE — Telephone Encounter (Signed)
Pt states that he is running out of time for the renewal of his handicap tag.   Pt was very concerned that his tags are going to run out before getting this matter taken care of. Pt states he has been calling for weeks to get this handle.  Pt stated that he would really like to pick this form up by in the morning.   Please advise

## 2021-10-10 NOTE — Telephone Encounter (Signed)
Parking placard filled out and waiting for provider's signature

## 2021-10-11 NOTE — Telephone Encounter (Signed)
Handicap placard form filled out and given to patient 10/10/21. Patient states that he needs the form that is for the handicap license plate and not placard. Patient advised that he would need to get that from the [facility] and bring it in to be filled out and signed.

## 2021-10-17 ENCOUNTER — Encounter: Payer: Self-pay | Admitting: Internal Medicine

## 2021-10-17 NOTE — Telephone Encounter (Signed)
Printed DMV form placed on md desk.Marland KitchenAndee Poles

## 2021-10-17 NOTE — Telephone Encounter (Signed)
Ok done to [facility]ma 

## 2021-11-13 DIAGNOSIS — I517 Cardiomegaly: Secondary | ICD-10-CM | POA: Diagnosis not present

## 2021-11-23 ENCOUNTER — Other Ambulatory Visit: Payer: Self-pay | Admitting: Internal Medicine

## 2021-12-12 DIAGNOSIS — M1712 Unilateral primary osteoarthritis, left knee: Secondary | ICD-10-CM | POA: Diagnosis not present

## 2021-12-13 NOTE — Telephone Encounter (Signed)
error 

## 2021-12-17 DIAGNOSIS — M1712 Unilateral primary osteoarthritis, left knee: Secondary | ICD-10-CM | POA: Insufficient documentation

## 2021-12-19 DIAGNOSIS — M1712 Unilateral primary osteoarthritis, left knee: Secondary | ICD-10-CM | POA: Diagnosis not present

## 2021-12-26 DIAGNOSIS — M1712 Unilateral primary osteoarthritis, left knee: Secondary | ICD-10-CM | POA: Diagnosis not present

## 2022-02-14 ENCOUNTER — Other Ambulatory Visit: Payer: Self-pay | Admitting: Internal Medicine

## 2022-03-02 DIAGNOSIS — W228XXA Striking against or struck by other objects, initial encounter: Secondary | ICD-10-CM | POA: Diagnosis not present

## 2022-03-02 DIAGNOSIS — Y998 Other external cause status: Secondary | ICD-10-CM | POA: Diagnosis not present

## 2022-03-02 DIAGNOSIS — S7012XA Contusion of left thigh, initial encounter: Secondary | ICD-10-CM | POA: Diagnosis not present

## 2022-03-06 ENCOUNTER — Telehealth: Payer: Self-pay

## 2022-03-06 NOTE — Telephone Encounter (Signed)
Transition Care Management Follow-up Telephone Call Date of discharge and from where: 03/02/2022-from [facility] How have you been since you were released from the hospital? Still has hematoma but has contracted covid during ED visit Any questions or concerns? Yes  Items Reviewed: Did the pt receive and understand the discharge instructions provided?    Medications obtained and verified?    Other?    Any new allergies since your discharge?    Dietary orders reviewed? No Do you have support at home?     Home Care and Equipment/Supplies: Were home health services ordered?  If so, what is the name of the agency?   Has the agency set up a time to come to the patient's home? not applicable Were any new equipment or medical supplies ordered?  No What is the name of the medical supply agency?  Were you able to get the supplies/equipment?  Do you have any questions related to the use of the equipment or supplies? No  Functional Questionnaire: (I = Independent and D = Dependent) ADLs:   Bathing/Dressing-   Meal Prep-   Eating-   Maintaining continence-   Transferring/Ambulation-   Managing Meds-   Follow up appointments reviewed:  PCP Hospital f/u appt confirmed? Yes  Scheduled to see Dr.John on 03/18/22 @ 1:20. [facility] f/u appt confirmed? No   Are transportation arrangements needed? No  If their condition worsens, is the pt aware to call PCP or go to the Emergency Dept.? Yes Was the patient provided with contact information for the PCP's office or ED? Yes Was to pt encouraged to call back with questions or concerns? Yes

## 2022-03-18 ENCOUNTER — Ambulatory Visit: Payer: Medicare (Managed Care) | Admitting: Internal Medicine

## 2022-03-18 ENCOUNTER — Ambulatory Visit (INDEPENDENT_AMBULATORY_CARE_PROVIDER_SITE_OTHER): Payer: Medicare (Managed Care) | Admitting: Internal Medicine

## 2022-03-18 ENCOUNTER — Telehealth: Payer: Self-pay | Admitting: Internal Medicine

## 2022-03-18 ENCOUNTER — Encounter: Payer: Self-pay | Admitting: Internal Medicine

## 2022-03-18 VITALS — BP 134/70 | HR 85 | Temp 98.7°F | Ht 72.0 in | Wt 203.0 lb

## 2022-03-18 DIAGNOSIS — S8011XA Contusion of right lower leg, initial encounter: Secondary | ICD-10-CM | POA: Diagnosis not present

## 2022-03-18 DIAGNOSIS — I10 Essential (primary) hypertension: Secondary | ICD-10-CM | POA: Diagnosis not present

## 2022-03-18 DIAGNOSIS — E559 Vitamin D deficiency, unspecified: Secondary | ICD-10-CM | POA: Diagnosis not present

## 2022-03-18 DIAGNOSIS — Z0001 Encounter for general adult medical examination with abnormal findings: Secondary | ICD-10-CM | POA: Diagnosis not present

## 2022-03-18 DIAGNOSIS — E7800 Pure hypercholesterolemia, unspecified: Secondary | ICD-10-CM

## 2022-03-18 DIAGNOSIS — E1140 Type 2 diabetes mellitus with diabetic neuropathy, unspecified: Secondary | ICD-10-CM | POA: Diagnosis not present

## 2022-03-18 DIAGNOSIS — E538 Deficiency of other specified B group vitamins: Secondary | ICD-10-CM

## 2022-03-18 LAB — CBC WITH DIFFERENTIAL/PLATELET
Basophils Absolute: 0.1 10*3/uL (ref 0.0–0.1)
Basophils Relative: 0.9 % (ref 0.0–3.0)
Eosinophils Absolute: 0.2 10*3/uL (ref 0.0–0.7)
Eosinophils Relative: 2.3 % (ref 0.0–5.0)
HCT: 35.9 % — ABNORMAL LOW (ref 39.0–52.0)
Hemoglobin: 11.7 g/dL — ABNORMAL LOW (ref 13.0–17.0)
Lymphocytes Relative: 17.3 % (ref 12.0–46.0)
Lymphs Abs: 1.3 10*3/uL (ref 0.7–4.0)
MCHC: 32.6 g/dL (ref 30.0–36.0)
MCV: 82 fl (ref 78.0–100.0)
Monocytes Absolute: 0.4 10*3/uL (ref 0.1–1.0)
Monocytes Relative: 5.1 % (ref 3.0–12.0)
Neutro Abs: 5.5 10*3/uL (ref 1.4–7.7)
Neutrophils Relative %: 74.4 % (ref 43.0–77.0)
Platelets: 321 10*3/uL (ref 150.0–400.0)
RBC: 4.38 Mil/uL (ref 4.22–5.81)
RDW: 17.3 % — ABNORMAL HIGH (ref 11.5–15.5)
WBC: 7.4 10*3/uL (ref 4.0–10.5)

## 2022-03-18 LAB — HEPATIC FUNCTION PANEL
ALT: 20 U/L (ref 0–53)
AST: 25 U/L (ref 0–37)
Albumin: 4.1 g/dL (ref 3.5–5.2)
Alkaline Phosphatase: 67 U/L (ref 39–117)
Bilirubin, Direct: 0.1 mg/dL (ref 0.0–0.3)
Total Bilirubin: 0.7 mg/dL (ref 0.2–1.2)
Total Protein: 6.8 g/dL (ref 6.0–8.3)

## 2022-03-18 LAB — URINALYSIS, ROUTINE W REFLEX MICROSCOPIC
Bilirubin Urine: NEGATIVE
Hgb urine dipstick: NEGATIVE
Leukocytes,Ua: NEGATIVE
Nitrite: NEGATIVE
RBC / HPF: NONE SEEN (ref 0–?)
Specific Gravity, Urine: >=1.03 — AB (ref 1.000–1.030)
Total Protein, Urine: NEGATIVE
Urine Glucose: NEGATIVE
Urobilinogen, UA: 0.2 (ref 0.0–1.0)
WBC, UA: NONE SEEN (ref 0–?)
pH: 6 (ref 5.0–8.0)

## 2022-03-18 LAB — BASIC METABOLIC PANEL
BUN: 28 mg/dL — ABNORMAL HIGH (ref 6–23)
CO2: 27 mEq/L (ref 19–32)
Calcium: 9.5 mg/dL (ref 8.4–10.5)
Chloride: 104 mEq/L (ref 96–112)
Creatinine, Ser: 1.54 mg/dL — ABNORMAL HIGH (ref 0.40–1.50)
GFR: 41.42 mL/min — ABNORMAL LOW (ref >60.00)
Glucose, Bld: 155 mg/dL — ABNORMAL HIGH (ref 70–99)
Potassium: 4.3 mEq/L (ref 3.5–5.1)
Sodium: 139 mEq/L (ref 135–145)

## 2022-03-18 LAB — MICROALBUMIN / CREATININE URINE RATIO
Creatinine,U: 117 mg/dL
Microalb Creat Ratio: 0.7 mg/g (ref 0.0–30.0)
Microalb, Ur: 0.9 mg/dL (ref 0.0–1.9)

## 2022-03-18 LAB — LDL CHOLESTEROL, DIRECT: Direct LDL: 80 mg/dL

## 2022-03-18 LAB — TSH: TSH: 5.94 u[IU]/mL — ABNORMAL HIGH (ref 0.35–5.50)

## 2022-03-18 LAB — LIPID PANEL
Cholesterol: 138 mg/dL (ref 0–200)
HDL: 42.2 mg/dL (ref >39.00)
NonHDL: 95.6
Total CHOL/HDL Ratio: 3
Triglycerides: 234 mg/dL — ABNORMAL HIGH (ref 0.0–149.0)
VLDL: 46.8 mg/dL — ABNORMAL HIGH (ref 0.0–40.0)

## 2022-03-18 LAB — HEMOGLOBIN A1C: Hgb A1c MFr Bld: 6.4 % (ref 4.6–6.5)

## 2022-03-18 LAB — VITAMIN D 25 HYDROXY (VIT D DEFICIENCY, FRACTURES): VITD: 54.63 ng/mL (ref 30.00–100.00)

## 2022-03-18 LAB — VITAMIN B12: Vitamin B-12: >1500 pg/mL — ABNORMAL HIGH (ref 211–911)

## 2022-03-18 MED ORDER — ONETOUCH VERIO VI STRP: ORAL_STRIP | 12 refills | Status: DC

## 2022-03-18 MED ORDER — ACCU-CHEK GUIDE VI STRP: ORAL_STRIP | 3 refills | Status: DC

## 2022-03-18 MED ORDER — LANCETS MISC: 3 refills | Status: AC

## 2022-03-18 MED ORDER — ONETOUCH VERIO FLEX SYSTEM W/DEVICE KIT: PACK | 0 refills | Status: DC

## 2022-03-18 NOTE — Telephone Encounter (Signed)
[facility] in [place] called and said that Accu-check guide strips are not covered by his insurance.   Patient will need one-touch verio rx for machine, lancets, and strips

## 2022-03-18 NOTE — Assessment & Plan Note (Signed)
Lab Results  Component Value Date   HGBA1C 6.4 03/18/2022   Stable, pt to continue current medical treatment metfomrin ER 500 2 qd

## 2022-03-18 NOTE — Telephone Encounter (Signed)
Ok this is done 

## 2022-03-18 NOTE — Telephone Encounter (Signed)
[facility] in [place] called and said that Accu-check guide strips are not covered by his insurance.  Patient will need one-touch verio rx for machine, lancets, and strips  Pharmacy #  [phone]

## 2022-03-18 NOTE — Assessment & Plan Note (Signed)
BP Readings from Last 3 Encounters:  03/18/22 134/70  02/18/21 124/80  11/15/20 (!) 144/68   Stable, pt to continue medical treatment losartan 50 mg qd, toprol xl 25 qd

## 2022-03-18 NOTE — Progress Notes (Signed)
Patient ID: Nathan Russo, male   DOB: [birth year], 82 y.o.   MRN: [id number]         Chief Complaint:: wellness exam and ED follow up  After right leg hematoma, dm, htn, hld       HPI:  Nathan Russo is a 82 y.o. male here for wellness exam; sched for eye exam next wk, declines covid booster and shingrix, o/w up to date   Pt denies chest pain, increased sob or doe, wheezing, orthopnea, PND, increased LE swelling, palpitations, dizziness or syncope.   Pt denies polydipsia, polyuria, or new focal neuro s/s.    Pt denies fever, wt loss, night sweats, loss of appetite, or other constitutional symptoms  Does have 2 wk onset right medial upper leg hematoma and post knee bruising after minor trauma on the pew.on eliquis and prasugrel   Wt Readings from Last 3 Encounters:  03/18/22 203 lb (92.1 kg)  02/18/21 203 lb (92.1 kg)  11/15/20 196 lb (88.9 kg)   BP Readings from Last 3 Encounters:  03/18/22 134/70  02/18/21 124/80  11/15/20 (!) 144/68   Immunization History  Administered Date(s) Administered   Fluad Quad(high Dose 65+) 12/13/2018, 02/07/2020   Influenza Split 12/13/2010   Influenza Whole 12/09/2007, 12/13/2009   Influenza, High Dose Seasonal PF 12/10/2014, 11/04/2016, 11/10/2017   Influenza, Seasonal, Injecte, Preservative Fre 12/08/2012   Influenza,inj,Quad PF,6+ Mos 11/07/2015   Influenza-Unspecified 12/08/2012, 02/06/2021, 12/23/2021   PFIZER(Purple Top)SARS-COV-2 Vaccination 04/03/2019, 05/31/2019   Pneumococcal Conjugate-13 03/22/2013   Pneumococcal Polysaccharide-23 01/08/2006, 03/25/2011   Td 05/17/2009   Tdap 02/07/2020   Health Maintenance Due  Topic Date Due   Medicare Annual Wellness (AWV)  03/22/2022      Past Medical History:  Diagnosis Date   Abdominal pain, epigastric 04/11/2010   BELCHING 04/23/2010   BRADYCARDIA, CHRONIC 10/24/2006   CARPAL TUNNEL SYNDROME, BILATERAL 02/07/2009   CHEST PAIN-UNSPECIFIED 09/05/2008   CHOLELITHIASIS 04/22/2010   Cholelithiasis  06/13/2010   COLONIC POLYPS, HX OF 04/28/2007   DEGENERATIVE JOINT DISEASE, RIGHT KNEE 10/24/2006   Depression 02/23/2011   DIABETES MELLITUS, TYPE II 04/28/2007   Dizziness and giddiness 12/19/2009   Elevated PSA 06/13/2010   FATIGUE 04/28/2007   GERD 02/11/2009   Headache(784.0) 12/19/2009   HYPERLIPIDEMIA 04/28/2007   HYPERTENSION 10/21/2006   NECK MASS 02/07/2010   OBESITY 10/24/2006   OTITIS MEDIA, ACUTE, LEFT 12/26/2009   PHIMOSIS 02/07/2009   S/P laparoscopic cholecystectomy 10/31/2010   Thyroid cancer ([place]) 06/13/2010   THYROID NODULE 02/07/2010   Past Surgical History:  Procedure Laterality Date   CHOLECYSTECTOMY     left knee surgery     right wrist surgury     THYROID SURGERY      reports that he has quit smoking. He has never used smokeless tobacco. He reports that he does not drink alcohol and does not use drugs. family history includes Arthritis in his mother; Cancer in his brother and sister; Colon cancer (age of onset: 40) in his brother; Diabetes in his brother; Goiter in his mother; Heart attack (age of onset: 70) in his brother; Heart attack (age of onset: 34) in his father; Hypertension in his mother; Hypothyroidism in his sister. Allergies  Allergen Reactions   Diphenoxylate-Atropine Other (See Comments)   Other Other (See Comments) and Hives    Causes body to ache Causes body to ache   Sulfa Antibiotics Other (See Comments)    As child almost died   Ace Inhibitors  REACTION: cough   Atenolol     REACTION: bradycardia   Codeine Other (See Comments)    Head spins "wild"   Levaquin [Levofloxacin]     Interferes with flecainide   Lovastatin     REACTION: myalygros   Morphine Nausea And Vomiting   Morphine And Related Other (See Comments)   Statins Other (See Comments)    Causes body to ache   Sulfamethoxazole Other (See Comments)    Childhood unknown reaction   Sulfasalazine Other (See Comments)    As child almost died   Tramadol     Ants crawling all over    Current Outpatient Medications on File Prior to Visit  Medication Sig Dispense Refill   amiodarone (PACERONE) 200 MG tablet Take by mouth.     apixaban (ELIQUIS) 2.5 MG TABS tablet Take 1 tablet (2.5 mg total) by mouth 2 (two) times daily. 180 tablet 3   Ascorbic Acid (VITAMIN C) 1000 MG tablet Take 1,000 mg by mouth daily.     augmented betamethasone dipropionate (DIPROLENE-AF) 0.05 % ointment betamethasone, augmented 0.05 % topical ointment  APPLY OINTMENT TOPICALLY TO EACH EAR AS NEEDED FOR ITCHING     Calcium Carb-Cholecalciferol [phone] MG-UNIT CAPS Take by mouth.     Cinnamon 500 MG capsule Take 500 mg by mouth 2 (two) times daily.     cyanocobalamin 1000 MCG tablet Take by mouth.     Diclofenac Sodium (PENNSAID) 2 % SOLN Pennsaid 20 mg/gram/actuation (2 %) topical soln in metered-dose pump  APPLY 2 PUMPS (40 MG) TO THE AFFECTED KNEE BY TOPICAL ROUTE 2 TIMES PER DAY     doxycycline (VIBRA-TABS) 100 MG tablet      ezetimibe (ZETIA) 10 MG tablet      famotidine (PEPCID) 20 MG tablet Take 1 tablet (20 mg total) by mouth 2 (two) times daily. 180 tablet 3   Flaxseed, Linseed, 1000 MG CAPS Take 1 capsule by mouth daily.     Garlic Oil 277 MG TABS Take by mouth 2 (two) times daily.     Ginger, Zingiber officinalis, (GINGER PO) Take by mouth 4 (four) times daily.     Glucosamine-Chondroit-Vit C-Mn (GLUCOSAMINE CHONDROITIN COMPLX) CAPS Take by mouth daily.     isosorbide mononitrate (IMDUR) 60 MG 24 hr tablet Take by mouth.     ketoconazole (NIZORAL) 2 % cream ketoconazole 2 % topical cream  APPLY CREAM TOPICALLY ONCE DAILY FOR 30 DAYS     levothyroxine (SYNTHROID) 112 MCG tablet Take 1 tablet (112 mcg total) by mouth daily. 90 tablet 3   losartan (COZAAR) 50 MG tablet      metFORMIN (GLUCOPHAGE-XR) 500 MG 24 hr tablet Take 1 tablet by mouth twice daily 180 tablet 0   metoprolol succinate (TOPROL-XL) 25 MG 24 hr tablet      Multiple Vitamin (MULTIVITAMIN) capsule Take 1 capsule by mouth  daily.     nitroGLYCERIN (NITROSTAT) 0.4 MG SL tablet      Omega-3 Fatty Acids (FISH OIL) 1000 MG CAPS Take by mouth 2 (two) times daily.     pantoprazole (PROTONIX) 40 MG tablet Take 2 tablets by mouth once daily 180 tablet 3   prasugrel (EFFIENT) 10 MG TABS tablet Take 10 mg by mouth daily.     VITAMIN D, CHOLECALCIFEROL, PO Take by mouth daily.     ciclopirox (PENLAC) 8 % solution Apply topically daily.     hydrocortisone 2.5 % ointment Apply topically 2 (two) times daily.     No  current facility-administered medications on file prior to visit.        ROS:  All others reviewed and negative.  Objective        PE:  BP 134/70 (BP Location: Left Arm, Patient Position: Sitting, Cuff Size: Large)   Pulse 85   Temp 98.7 F (37.1 C) (Oral)   Ht 6' (1.829 m)   Wt 203 lb (92.1 kg)   SpO2 98%   BMI 27.53 kg/m                 Constitutional: Pt appears in NAD               HENT: Head: NCAT.                Right Ear: External ear normal.                 Left Ear: External ear normal.                Eyes: . Pupils are equal, round, and reactive to light. Conjunctivae and EOM are normal               Nose: without d/c or deformity               Neck: Neck supple. Gross normal ROM               Cardiovascular: Normal rate and regular rhythm.                 Pulmonary/Chest: Effort normal and breath sounds without rales or wheezing.                Abd:  Soft, NT, ND, + BS, no organomegaly               Neurological: Pt is alert. At baseline orientation, motor grossly intact               Skin: Skin is warm. LE edema - none, right medial upper leg with 2 cm area tender hematoma and assoc bruising               Psychiatric: Pt behavior is normal without agitation   Micro: none  Cardiac tracings I have personally interpreted today:  none  Pertinent Radiological findings (summarize): none   Lab Results  Component Value Date   WBC 7.4 03/18/2022   HGB 11.7 (L) 03/18/2022   HCT 35.9 (L)  03/18/2022   PLT 321.0 03/18/2022   GLUCOSE 155 (H) 03/18/2022   CHOL 138 03/18/2022   TRIG 234.0 (H) 03/18/2022   HDL 42.20 03/18/2022   LDLDIRECT 80.0 03/18/2022   LDLCALC 47 02/18/2021   ALT 20 03/18/2022   AST 25 03/18/2022   NA 139 03/18/2022   K 4.3 03/18/2022   CL 104 03/18/2022   CREATININE 1.54 (H) 03/18/2022   BUN 28 (H) 03/18/2022   CO2 27 03/18/2022   TSH 5.94 (H) 03/18/2022   PSA 3.59 05/08/2017   INR 1.0 08/22/2008   HGBA1C 6.4 03/18/2022   MICROALBUR 0.9 03/18/2022   Assessment/Plan:  ARTEMUS ROMANOFF is a 82 y.o. White or Caucasian [1] male with  has a past medical history of Abdominal pain, epigastric (04/11/2010), BELCHING (04/23/2010), BRADYCARDIA, CHRONIC (10/24/2006), CARPAL TUNNEL SYNDROME, BILATERAL (02/07/2009), CHEST PAIN-UNSPECIFIED (09/05/2008), CHOLELITHIASIS (04/22/2010), Cholelithiasis (06/13/2010), COLONIC POLYPS, HX OF (04/28/2007), DEGENERATIVE JOINT DISEASE, RIGHT KNEE (10/24/2006), Depression (02/23/2011), DIABETES MELLITUS, TYPE II (04/28/2007), Dizziness and giddiness (12/19/2009), Elevated PSA (06/13/2010), FATIGUE (04/28/2007),  GERD (02/11/2009), Headache(784.0) (12/19/2009), HYPERLIPIDEMIA (04/28/2007), HYPERTENSION (10/21/2006), NECK MASS (02/07/2010), OBESITY (10/24/2006), OTITIS MEDIA, ACUTE, LEFT (12/26/2009), PHIMOSIS (02/07/2009), S/P laparoscopic cholecystectomy (10/31/2010), Thyroid cancer ([place]) (06/13/2010), and THYROID NODULE (02/07/2010).  Encounter for well adult exam with abnormal findings Age and sex appropriate education and counseling updated with regular exercise and diet Referrals for preventative services - none needed Immunizations addressed - declines covid booster, for shingrix at pharmacy Smoking counseling  - none needed Evidence for depression or other mood disorder - none significant Most recent labs reviewed. I have personally reviewed and have noted: 1) the patient's medical and social history 2) The patient's current medications and  supplements 3) The patient's height, weight, and BMI have been recorded in the chart   Diabetes mellitus ([place]) Lab Results  Component Value Date   HGBA1C 6.4 03/18/2022   Stable, pt to continue current medical treatment metfomrin ER 500 2 qd   Essential hypertension BP Readings from Last 3 Encounters:  03/18/22 134/70  02/18/21 124/80  11/15/20 (!) 144/68   Stable, pt to continue medical treatment losartan 50 mg qd, toprol xl 25 qd   Hyperlipidemia Lab Results  Component Value Date   LDLCALC 47 02/18/2021   Stable, pt to continue current statin zetia 10 mg qd   Leg hematoma, right, initial encounter With minor trauma, d/w pt natural history, cont all current med and  to f/u any worsening symptoms or concerns  Followup: Return in about 6 months (around 09/16/2022).  Cathlean Cower, MD 03/18/2022 9:20 PM [facility]

## 2022-03-18 NOTE — Assessment & Plan Note (Signed)
Age and sex appropriate education and counseling updated with regular exercise and diet Referrals for preventative services - none needed Immunizations addressed - declines covid booster, for shingrix at pharmacy Smoking counseling  - none needed Evidence for depression or other mood disorder - none significant Most recent labs reviewed. I have personally reviewed and have noted: 1) the patient's medical and social history 2) The patient's current medications and supplements 3) The patient's height, weight, and BMI have been recorded in the chart  

## 2022-03-18 NOTE — Assessment & Plan Note (Signed)
Lab Results  Component Value Date   LDLCALC 47 02/18/2021   Stable, pt to continue current statin zetia 10 mg qd

## 2022-03-18 NOTE — Assessment & Plan Note (Signed)
With minor trauma, d/w pt natural history, cont all current med and  to f/u any worsening symptoms or concerns

## 2022-03-18 NOTE — Patient Instructions (Addendum)
Please have your Shingrix (shingles) shots done at your local pharmacy.  Please continue all other medications as before, and refills have been done if requested - test strips  Please have the pharmacy call with any other refills you may need.  Please continue your efforts at being more active, low cholesterol diet, and weight control.  You are otherwise up to date with prevention measures today.  Please keep your appointments with your specialists as you may have planned  [facility] to [place] the Jan 23 appt  Please go to the LAB at the blood drawing area for the tests to be done  You will be contacted by phone if any changes need to be made immediately.  Otherwise, you will receive a letter about your results with an explanation, but please check with MyChart first.  Please remember to sign up for MyChart if you have not done so, as this will be important to you in the future with finding out test results, communicating by private email, and scheduling acute appointments online when needed.  Please make an Appointment to return in 6 months, or sooner if needed

## 2022-03-28 ENCOUNTER — Ambulatory Visit (INDEPENDENT_AMBULATORY_CARE_PROVIDER_SITE_OTHER): Payer: Medicare (Managed Care) | Admitting: Internal Medicine

## 2022-03-28 VITALS — BP 128/62 | HR 51 | Temp 98.7°F | Ht 72.0 in | Wt 216.6 lb

## 2022-03-28 DIAGNOSIS — G629 Polyneuropathy, unspecified: Secondary | ICD-10-CM | POA: Insufficient documentation

## 2022-03-28 DIAGNOSIS — I10 Essential (primary) hypertension: Secondary | ICD-10-CM | POA: Diagnosis not present

## 2022-03-28 DIAGNOSIS — R202 Paresthesia of skin: Secondary | ICD-10-CM

## 2022-03-28 DIAGNOSIS — M26622 Arthralgia of left temporomandibular joint: Secondary | ICD-10-CM

## 2022-03-28 DIAGNOSIS — E1140 Type 2 diabetes mellitus with diabetic neuropathy, unspecified: Secondary | ICD-10-CM

## 2022-03-28 DIAGNOSIS — G6289 Other specified polyneuropathies: Secondary | ICD-10-CM

## 2022-03-28 DIAGNOSIS — M26629 Arthralgia of temporomandibular joint, unspecified side: Secondary | ICD-10-CM | POA: Insufficient documentation

## 2022-03-28 NOTE — Patient Instructions (Signed)
Please continue all other medications as before, and refills have been done if requested.  Please have the pharmacy call with any other refills you may need.  Please continue your efforts at being more active, low cholesterol diet, and weight control.  You are otherwise up to date with prevention measures today.  Please keep your appointments with your specialists as you may have planned  You will be contacted regarding the referral for: Neurology for the numbness, and oral surgeon for the left TMJ  No further lab work needed today

## 2022-03-28 NOTE — Progress Notes (Signed)
Patient ID: Nathan Russo, male   DOB: Nov 18, 1940, 82 y.o.   MRN: [id number]        Chief Complaint: follow up HTN, HLD and dm with new worsening fett numbness, and left TMJ pain       HPI:  Nathan Russo is a 82 y.o. male her overall doing ok,  Pt denies chest pain, increased sob or doe, wheezing, orthopnea, PND, increased LE swelling, palpitations, dizziness or syncope.   Pt denies polydipsia, polyuria, or new focal neuro s/s, but has worsening bilateral toe numbness.    Pt denies fever, wt loss, night sweats, loss of appetite, or other constitutional symptoms  also c/o 2 wks onset mild to mod TMJ pain without clicking or swelling.  Pain is dull, mild intermittent, worse with talking and chewing.         Wt Readings from Last 3 Encounters:  03/28/22 216 lb 9.6 oz (98.2 kg)  03/18/22 203 lb (92.1 kg)  02/18/21 203 lb (92.1 kg)   BP Readings from Last 3 Encounters:  03/28/22 128/62  03/18/22 134/70  02/18/21 124/80         Past Medical History:  Diagnosis Date   Abdominal pain, epigastric 04/11/2010   BELCHING 04/23/2010   BRADYCARDIA, CHRONIC 10/24/2006   CARPAL TUNNEL SYNDROME, BILATERAL 02/07/2009   CHEST PAIN-UNSPECIFIED 09/05/2008   CHOLELITHIASIS 04/22/2010   Cholelithiasis 06/13/2010   COLONIC POLYPS, HX OF 04/28/2007   DEGENERATIVE JOINT DISEASE, RIGHT KNEE 10/24/2006   Depression 02/23/2011   DIABETES MELLITUS, TYPE II 04/28/2007   Dizziness and giddiness 12/19/2009   Elevated PSA 06/13/2010   FATIGUE 04/28/2007   GERD 02/11/2009   Headache(784.0) 12/19/2009   HYPERLIPIDEMIA 04/28/2007   HYPERTENSION 10/21/2006   NECK MASS 02/07/2010   OBESITY 10/24/2006   OTITIS MEDIA, ACUTE, LEFT 12/26/2009   PHIMOSIS 02/07/2009   S/P laparoscopic cholecystectomy 10/31/2010   Thyroid cancer ([place]) 06/13/2010   THYROID NODULE 02/07/2010   Past Surgical History:  Procedure Laterality Date   CHOLECYSTECTOMY     left knee surgery     right wrist surgury     THYROID SURGERY      reports that he has  quit smoking. He has never used smokeless tobacco. He reports that he does not drink alcohol and does not use drugs. family history includes Arthritis in his mother; Cancer in his brother and sister; Colon cancer (age of onset: 40) in his brother; Diabetes in his brother; Goiter in his mother; Heart attack (age of onset: 56) in his brother; Heart attack (age of onset: 82) in his father; Hypertension in his mother; Hypothyroidism in his sister. Allergies  Allergen Reactions   Diphenoxylate-Atropine Other (See Comments)   Other Other (See Comments) and Hives    Causes body to ache Causes body to ache   Sulfa Antibiotics Other (See Comments)    As child almost died   Ace Inhibitors     REACTION: cough   Atenolol     REACTION: bradycardia   Codeine Other (See Comments)    Head spins "wild"   Levaquin [Levofloxacin]     Interferes with flecainide   Lovastatin     REACTION: myalygros   Morphine Nausea And Vomiting   Morphine And Related Other (See Comments)   Statins Other (See Comments)    Causes body to ache   Sulfamethoxazole Other (See Comments)    Childhood unknown reaction   Sulfasalazine Other (See Comments)    As child almost died  Tramadol     Ants crawling all over   Current Outpatient Medications on File Prior to Visit  Medication Sig Dispense Refill   amiodarone (PACERONE) 200 MG tablet Take by mouth.     apixaban (ELIQUIS) 2.5 MG TABS tablet Take 1 tablet (2.5 mg total) by mouth 2 (two) times daily. 180 tablet 3   Ascorbic Acid (VITAMIN C) 1000 MG tablet Take 1,000 mg by mouth daily.     augmented betamethasone dipropionate (DIPROLENE-AF) 0.05 % ointment betamethasone, augmented 0.05 % topical ointment  APPLY OINTMENT TOPICALLY TO EACH EAR AS NEEDED FOR ITCHING     Blood Glucose Monitoring Suppl (ONETOUCH VERIO FLEX SYSTEM) w/Device KIT Use as directed three times per day E11.9 1 kit 0   Calcium Carb-Cholecalciferol [phone] MG-UNIT CAPS Take by mouth.     ciclopirox  (PENLAC) 8 % solution Apply topically daily.     Cinnamon 500 MG capsule Take 500 mg by mouth 2 (two) times daily.     cyanocobalamin 1000 MCG tablet Take by mouth.     Diclofenac Sodium (PENNSAID) 2 % SOLN Pennsaid 20 mg/gram/actuation (2 %) topical soln in metered-dose pump  APPLY 2 PUMPS (40 MG) TO THE AFFECTED KNEE BY TOPICAL ROUTE 2 TIMES PER DAY     ezetimibe (ZETIA) 10 MG tablet      famotidine (PEPCID) 20 MG tablet Take 1 tablet (20 mg total) by mouth 2 (two) times daily. 180 tablet 3   Flaxseed, Linseed, 1000 MG CAPS Take 1 capsule by mouth daily.     Garlic Oil 597 MG TABS Take by mouth 2 (two) times daily.     Ginger, Zingiber officinalis, (GINGER PO) Take by mouth 4 (four) times daily.     Glucosamine-Chondroit-Vit C-Mn (GLUCOSAMINE CHONDROITIN COMPLX) CAPS Take by mouth daily.     glucose blood (ONETOUCH VERIO) test strip Use as instructed three times per day E11.9 300 each 12   hydrocortisone 2.5 % ointment Apply topically 2 (two) times daily.     isosorbide mononitrate (IMDUR) 60 MG 24 hr tablet Take by mouth.     ketoconazole (NIZORAL) 2 % cream ketoconazole 2 % topical cream  APPLY CREAM TOPICALLY ONCE DAILY FOR 30 DAYS     Lancets MISC Use as directed three times per day E11.9 300 each 3   levothyroxine (SYNTHROID) 112 MCG tablet Take 1 tablet (112 mcg total) by mouth daily. 90 tablet 3   losartan (COZAAR) 50 MG tablet      metFORMIN (GLUCOPHAGE-XR) 500 MG 24 hr tablet Take 1 tablet by mouth twice daily 180 tablet 0   metoprolol succinate (TOPROL-XL) 25 MG 24 hr tablet      Multiple Vitamin (MULTIVITAMIN) capsule Take 1 capsule by mouth daily.     nitroGLYCERIN (NITROSTAT) 0.4 MG SL tablet      Omega-3 Fatty Acids (FISH OIL) 1000 MG CAPS Take by mouth 2 (two) times daily.     pantoprazole (PROTONIX) 40 MG tablet Take 2 tablets by mouth once daily 180 tablet 3   prasugrel (EFFIENT) 10 MG TABS tablet Take 10 mg by mouth daily.     VITAMIN D, CHOLECALCIFEROL, PO Take by mouth  daily.     doxycycline (VIBRA-TABS) 100 MG tablet  (Patient not taking: Reported on 03/28/2022)     No current facility-administered medications on file prior to visit.        ROS:  All others reviewed and negative.  Objective        PE:  BP  128/62 (BP Location: Left Arm, Patient Position: Sitting, Cuff Size: Large)   Pulse (!) 51   Temp 98.7 F (37.1 C) (Oral)   Ht 6' (1.829 m)   Wt 216 lb 9.6 oz (98.2 kg)   SpO2 96%   BMI 29.38 kg/m                 Constitutional: Pt appears in NAD               HENT: Head: NCAT.                Right Ear: External ear normal.                 Left Ear: External ear normal.                Eyes: . Pupils are equal, round, and reactive to light. Conjunctivae and EOM are normal               Nose: without d/c or deformity               Neck: Neck supple. Gross normal ROM               Cardiovascular: Normal rate and regular rhythm.                 Pulmonary/Chest: Effort normal and breath sounds without rales or wheezing.                Abd:  Soft, NT, ND, + BS, no organomegaly               Neurological: Pt is alert. At baseline orientation, motor grossly intact, decreased sens to toes by LT               Skin: Skin is warm. No rashes, no other new lesions, LE edema - none               Psychiatric: Pt behavior is normal without agitation   Micro: none  Cardiac tracings I have personally interpreted today:  none  Pertinent Radiological findings (summarize): none   Lab Results  Component Value Date   WBC 7.4 03/18/2022   HGB 11.7 (L) 03/18/2022   HCT 35.9 (L) 03/18/2022   PLT 321.0 03/18/2022   GLUCOSE 155 (H) 03/18/2022   CHOL 138 03/18/2022   TRIG 234.0 (H) 03/18/2022   HDL 42.20 03/18/2022   LDLDIRECT 80.0 03/18/2022   LDLCALC 47 02/18/2021   ALT 20 03/18/2022   AST 25 03/18/2022   NA 139 03/18/2022   K 4.3 03/18/2022   CL 104 03/18/2022   CREATININE 1.54 (H) 03/18/2022   BUN 28 (H) 03/18/2022   CO2 27 03/18/2022   TSH 5.94  (H) 03/18/2022   PSA 3.59 05/08/2017   INR 1.0 08/22/2008   HGBA1C 6.4 03/18/2022   MICROALBUR 0.9 03/18/2022   Assessment/Plan:  Nathan Russo is a 82 y.o. White or Caucasian [1] male with  has a past medical history of Abdominal pain, epigastric (04/11/2010), BELCHING (04/23/2010), BRADYCARDIA, CHRONIC (10/24/2006), CARPAL TUNNEL SYNDROME, BILATERAL (02/07/2009), CHEST PAIN-UNSPECIFIED (09/05/2008), CHOLELITHIASIS (04/22/2010), Cholelithiasis (06/13/2010), COLONIC POLYPS, HX OF (04/28/2007), DEGENERATIVE JOINT DISEASE, RIGHT KNEE (10/24/2006), Depression (02/23/2011), DIABETES MELLITUS, TYPE II (04/28/2007), Dizziness and giddiness (12/19/2009), Elevated PSA (06/13/2010), FATIGUE (04/28/2007), GERD (02/11/2009), Headache(784.0) (12/19/2009), HYPERLIPIDEMIA (04/28/2007), HYPERTENSION (10/21/2006), NECK MASS (02/07/2010), OBESITY (10/24/2006), OTITIS MEDIA, ACUTE, LEFT (12/26/2009), PHIMOSIS (02/07/2009), S/P laparoscopic cholecystectomy (10/31/2010), Thyroid cancer ([place]) (06/13/2010), and THYROID NODULE (02/07/2010).  Paresthesia of both lower extremities With possible worsening peripheral neuropathy - for neuro referral pt pt request  Diabetes mellitus ([place]) Lab Results  Component Value Date   HGBA1C 6.4 03/18/2022   Stable, pt to continue current medical treatment metfomrin ER 500 mg bid   TMJ arthralgia Also for referral oral surgury for futther eval and tx  Essential hypertension BP Readings from Last 3 Encounters:  03/28/22 128/62  03/18/22 134/70  02/18/21 124/80   Stable, pt to continue medical treatment losartan 50 mg qd   Followup: Return if symptoms worsen or fail to improve.  Cathlean Cower, MD 03/29/2022 7:27 PM [facility]

## 2022-03-29 ENCOUNTER — Encounter: Payer: Self-pay | Admitting: Internal Medicine

## 2022-03-29 NOTE — Assessment & Plan Note (Signed)
Also for referral oral surgury for futther eval and tx

## 2022-03-29 NOTE — Assessment & Plan Note (Signed)
Lab Results  Component Value Date   HGBA1C 6.4 03/18/2022   Stable, pt to continue current medical treatment metfomrin ER 500 mg bid

## 2022-03-29 NOTE — Assessment & Plan Note (Signed)
With possible worsening peripheral neuropathy - for neuro referral pt pt request

## 2022-03-29 NOTE — Assessment & Plan Note (Signed)
BP Readings from Last 3 Encounters:  03/28/22 128/62  03/18/22 134/70  02/18/21 124/80   Stable, pt to continue medical treatment losartan 50 mg qd

## 2022-03-31 DIAGNOSIS — C73 Malignant neoplasm of thyroid gland: Secondary | ICD-10-CM | POA: Diagnosis not present

## 2022-03-31 DIAGNOSIS — R918 Other nonspecific abnormal finding of lung field: Secondary | ICD-10-CM | POA: Diagnosis not present

## 2022-04-01 ENCOUNTER — Ambulatory Visit: Payer: Medicare (Managed Care) | Admitting: Internal Medicine

## 2022-04-01 DIAGNOSIS — I499 Cardiac arrhythmia, unspecified: Secondary | ICD-10-CM | POA: Diagnosis not present

## 2022-04-01 DIAGNOSIS — I480 Paroxysmal atrial fibrillation: Secondary | ICD-10-CM | POA: Diagnosis not present

## 2022-04-01 DIAGNOSIS — I469 Cardiac arrest, cause unspecified: Secondary | ICD-10-CM | POA: Diagnosis not present

## 2022-04-01 DIAGNOSIS — I10 Essential (primary) hypertension: Secondary | ICD-10-CM | POA: Diagnosis not present

## 2022-04-01 DIAGNOSIS — E118 Type 2 diabetes mellitus with unspecified complications: Secondary | ICD-10-CM | POA: Diagnosis not present

## 2022-04-01 DIAGNOSIS — G4733 Obstructive sleep apnea (adult) (pediatric): Secondary | ICD-10-CM | POA: Diagnosis not present

## 2022-04-01 DIAGNOSIS — I4892 Unspecified atrial flutter: Secondary | ICD-10-CM | POA: Diagnosis not present

## 2022-04-01 DIAGNOSIS — I951 Orthostatic hypotension: Secondary | ICD-10-CM | POA: Diagnosis not present

## 2022-04-01 DIAGNOSIS — M7989 Other specified soft tissue disorders: Secondary | ICD-10-CM | POA: Diagnosis not present

## 2022-04-01 DIAGNOSIS — I4891 Unspecified atrial fibrillation: Secondary | ICD-10-CM | POA: Diagnosis not present

## 2022-04-01 DIAGNOSIS — I2102 ST elevation (STEMI) myocardial infarction involving left anterior descending coronary artery: Secondary | ICD-10-CM | POA: Diagnosis not present

## 2022-04-03 ENCOUNTER — Ambulatory Visit (INDEPENDENT_AMBULATORY_CARE_PROVIDER_SITE_OTHER): Payer: Medicare (Managed Care)

## 2022-04-03 VITALS — Ht 72.0 in | Wt 217.0 lb

## 2022-04-03 DIAGNOSIS — Z0000 Encounter for general adult medical examination without abnormal findings: Secondary | ICD-10-CM

## 2022-04-03 DIAGNOSIS — E1140 Type 2 diabetes mellitus with diabetic neuropathy, unspecified: Secondary | ICD-10-CM | POA: Diagnosis not present

## 2022-04-03 NOTE — Progress Notes (Signed)
Virtual Visit via Telephone Note  I connected with  Nathan Russo on 04/03/22 at  2:30 PM EST by telephone and verified that I am speaking with the correct person using two identifiers.  Location: Patient: Home Provider: [place] Persons participating in the virtual visit: [place]   I discussed the limitations, risks, security and privacy concerns of performing an evaluation and management service by telephone and the availability of in person appointments. The patient expressed understanding and agreed to proceed.  Interactive audio and video telecommunications were attempted between this nurse and patient, however failed, due to patient having technical difficulties OR patient did not have access to video capability.  We continued and completed visit with audio only.  Some vital signs may be absent or patient reported.   Sheral Flow, LPN  Subjective:   Nathan Russo is a 82 y.o. male who presents for Medicare Annual/Subsequent preventive examination.  Review of Systems     Cardiac Risk Factors include: advanced age (>24mn, >>98women);diabetes mellitus;dyslipidemia;family history of premature cardiovascular disease;hypertension;male gender;obesity (BMI >30kg/m2);sedentary lifestyle     Objective:    Today's Vitals   04/03/22 1501  Weight: 217 lb (98.4 kg)  Height: 6' (1.829 m)  PainSc: 1   PainLoc: Leg   Body mass index is 29.43 kg/m.     04/03/2022    2:38 PM 03/22/2021    3:26 PM 11/02/2019    3:44 PM 10/19/2018    3:44 PM 10/09/2017    2:49 PM 05/21/2016    2:47 PM 05/10/2015    2:24 PM  Advanced Directives  Does Patient Have a Medical Advance Directive? Yes Yes Yes Yes Yes Yes Yes  Type of AParamed[place]of [place] will Living will;Healthcare Power of Attorney Living will;Healthcare Power of [place]ew [place] will [place] will [place] will   Does patient want to make changes to medical advance directive?  No - Patient declined No - Patient declined      Copy of [place] Chart? No - copy requested No - copy requested No - copy requested No - copy requested No - copy requested No - copy requested No - copy requested    Current Medications (verified) Outpatient Encounter Medications as of 04/03/2022  Medication Sig   amiodarone (PACERONE) 200 MG tablet Take by mouth.   apixaban (ELIQUIS) 2.5 MG TABS tablet Take 1 tablet (2.5 mg total) by mouth 2 (two) times daily.   Ascorbic Acid (VITAMIN C) 1000 MG tablet Take 1,000 mg by mouth daily.   augmented betamethasone dipropionate (DIPROLENE-AF) 0.05 % ointment betamethasone, augmented 0.05 % topical ointment  APPLY OINTMENT TOPICALLY TO EACH EAR AS NEEDED FOR ITCHING   Blood Glucose Monitoring Suppl (ONETOUCH VERIO FLEX SYSTEM) w/Device KIT Use as directed three times per day E11.9   Calcium Carb-Cholecalciferol [phone] MG-UNIT CAPS Take by mouth.   ciclopirox (PENLAC) 8 % solution Apply topically daily.   Cinnamon 500 MG capsule Take 500 mg by mouth 2 (two) times daily.   cyanocobalamin 1000 MCG tablet Take by mouth.   Diclofenac Sodium (PENNSAID) 2 % SOLN Pennsaid 20 mg/gram/actuation (2 %) topical soln in metered-dose pump  APPLY 2 PUMPS (40 MG) TO THE AFFECTED KNEE BY TOPICAL ROUTE 2 TIMES PER DAY   doxycycline (VIBRA-TABS) 100 MG tablet  (Patient not taking: Reported on 03/28/2022)   ezetimibe (ZETIA) 10 MG tablet    famotidine (  PEPCID) 20 MG tablet Take 1 tablet (20 mg total) by mouth 2 (two) times daily.   Flaxseed, Linseed, 1000 MG CAPS Take 1 capsule by mouth daily.   Garlic Oil 299 MG TABS Take by mouth 2 (two) times daily.   Ginger, Zingiber officinalis, (GINGER PO) Take by mouth 4 (four) times daily.   Glucosamine-Chondroit-Vit C-Mn (GLUCOSAMINE CHONDROITIN COMPLX) CAPS Take by mouth daily.   glucose blood (ONETOUCH VERIO) test  strip Use as instructed three times per day E11.9   hydrocortisone 2.5 % ointment Apply topically 2 (two) times daily.   isosorbide mononitrate (IMDUR) 60 MG 24 hr tablet Take by mouth.   ketoconazole (NIZORAL) 2 % cream ketoconazole 2 % topical cream  APPLY CREAM TOPICALLY ONCE DAILY FOR 30 DAYS   Lancets MISC Use as directed three times per day E11.9   levothyroxine (SYNTHROID) 112 MCG tablet Take 1 tablet (112 mcg total) by mouth daily.   losartan (COZAAR) 50 MG tablet    metFORMIN (GLUCOPHAGE-XR) 500 MG 24 hr tablet Take 1 tablet by mouth twice daily   metoprolol succinate (TOPROL-XL) 25 MG 24 hr tablet    Multiple Vitamin (MULTIVITAMIN) capsule Take 1 capsule by mouth daily.   nitroGLYCERIN (NITROSTAT) 0.4 MG SL tablet    Omega-3 Fatty Acids (FISH OIL) 1000 MG CAPS Take by mouth 2 (two) times daily.   pantoprazole (PROTONIX) 40 MG tablet Take 2 tablets by mouth once daily   prasugrel (EFFIENT) 10 MG TABS tablet Take 10 mg by mouth daily.   VITAMIN D, CHOLECALCIFEROL, PO Take by mouth daily.   No facility-administered encounter medications on file as of 04/03/2022.    Allergies (verified) Diphenoxylate-atropine, Other, Sulfa antibiotics, Ace inhibitors, Atenolol, Codeine, Levaquin [levofloxacin], Lovastatin, Morphine, Morphine and related, Statins, Sulfamethoxazole, Sulfasalazine, and Tramadol   History: Past Medical History:  Diagnosis Date   Abdominal pain, epigastric 04/11/2010   BELCHING 04/23/2010   BRADYCARDIA, CHRONIC 10/24/2006   CARPAL TUNNEL SYNDROME, BILATERAL 02/07/2009   CHEST PAIN-UNSPECIFIED 09/05/2008   CHOLELITHIASIS 04/22/2010   Cholelithiasis 06/13/2010   COLONIC POLYPS, HX OF 04/28/2007   DEGENERATIVE JOINT DISEASE, RIGHT KNEE 10/24/2006   Depression 02/23/2011   DIABETES MELLITUS, TYPE II 04/28/2007   Dizziness and giddiness 12/19/2009   Elevated PSA 06/13/2010   FATIGUE 04/28/2007   GERD 02/11/2009   Headache(784.0) 12/19/2009   HYPERLIPIDEMIA 04/28/2007    HYPERTENSION 10/21/2006   NECK MASS 02/07/2010   OBESITY 10/24/2006   OTITIS MEDIA, ACUTE, LEFT 12/26/2009   PHIMOSIS 02/07/2009   S/P laparoscopic cholecystectomy 10/31/2010   Thyroid cancer ([place]) 06/13/2010   THYROID NODULE 02/07/2010   Past Surgical History:  Procedure Laterality Date   CHOLECYSTECTOMY     left knee surgery     right wrist surgury     THYROID SURGERY     Family History  Problem Relation Age of Onset   Heart attack Brother 13   Diabetes Brother    Cancer Brother        colon   Colon cancer Brother 17   Hypertension Mother    Arthritis Mother    Goiter Mother    Heart attack Father 82   Cancer Sister        breast   Hypothyroidism Sister    Social History   Socioeconomic History   Marital status: Married    Spouse name: Not on file   Number of children: 3   Years of education: Not on file   Highest education level: Not on file  Occupational History   Occupation: former Programmer, systems: RETIRED  Tobacco Use   Smoking status: Former   Smokeless tobacco: Never  Scientific laboratory technician Use: Never used  Substance and Sexual Activity   Alcohol use: No    Alcohol/week: 0.0 standard drinks of alcohol   Drug use: No   Sexual activity: Not Currently  Other Topics Concern   Not on file  Social History Narrative   Not on file   Social Determinants of Health   Financial Resource Strain: Low Risk  (04/03/2022)   Overall Financial Resource Strain (CARDIA)    Difficulty of Paying Living Expenses: Not hard at all  Food Insecurity: No Food Insecurity (04/03/2022)   Hunger Vital Sign    Worried About Running Out of Food in the Last Year: Never true    [place] in the Last Year: Never true  Transportation Needs: No Transportation Needs (04/03/2022)   PRAPARE - Hydrologist (Medical): No    Lack of Transportation (Non-Medical): No  Physical Activity: Sufficiently Active (04/03/2022)   Exercise  Vital Sign    Days of Exercise per Week: 7 days    Minutes of Exercise per Session: 60 min  Stress: No Stress Concern Present (04/03/2022)   [place]    Feeling of Stress : Not at all  Social Connections: [place] (04/03/2022)   Social Connection and Isolation Panel [NHANES]    Frequency of Communication with Friends and Family: More than three times a week    Frequency of Social Gatherings with Friends and Family: More than three times a week    Attends Religious Services: More than 4 times per year    Active Member of [employer] or Organizations: Yes    Attends Music therapist: More than 4 times per year    Marital Status: Married    Tobacco Counseling Counseling given: Not Answered   Clinical Intake:  Pre-visit preparation completed: Yes  Pain : No/denies pain Pain Score: 1      BMI - recorded: 29.43 Nutritional Status: BMI > 30  Obese Nutritional Risks: None Diabetes: Yes CBG done?: No Did pt. bring in CBG monitor from home?: No  How often do you need to have someone help you when you read instructions, pamphlets, or other written materials from your doctor or pharmacy?: 1 - Never What is the last grade level you completed in school?: Bachelor's Degree  Nutrition Risk Assessment:  Has the patient had any N/V/D within the last 2 months?  No  Does the patient have any non-healing wounds?  No  Has the patient had any unintentional weight loss or weight gain?  No   Diabetes:  Is the patient diabetic?  Yes  If diabetic, was a CBG obtained today?  No  Did the patient bring in their glucometer from home?  No  How often do you monitor your CBG's? 3 times per day.   Financial Strains and Diabetes Management:  Are you having any financial strains with the device, your supplies or your medication? No .  Does the patient want to be seen by Chronic Care Management for management of  their diabetes?  No  Would the patient like to be referred to a Nutritionist or for Diabetic Management?  No   Diabetic Exams:  Diabetic Eye Exam: Patient had eye exam on or scheduled for 04/08/2022, results  pending. Diabetic Foot Exam: Completed 03/18/2022   Interpreter Needed?: No  Information entered by :: Lisette Abu, LPN.   Activities of Daily Living    04/03/2022    3:06 PM  In your present state of health, do you have any difficulty performing the following activities:  Hearing? 1  Comment going for testing 04/09/2022  Vision? 0  Difficulty concentrating or making decisions? 0  Walking or climbing stairs? 0  Dressing or bathing? 0  Doing errands, shopping? 0  Preparing Food and eating ? N  Using the Toilet? N  In the past six months, have you accidently leaked urine? N  Do you have problems with loss of bowel control? N  Managing your Medications? N  Managing your Finances? N  Housekeeping or managing your Housekeeping? N    Patient Care Team: Biagio Borg, MD as PCP - General Philomena Doheny, MD as Referring Physician (Plastic Surgery) Ebbie Ridge, MD as Referring Physician (Cardiology) Poudyal, Ritesh, OD (Optometry)  Indicate any recent Medical Services you may have received from other than [facility] providers in the past year (date may be approximate).     Assessment:   This is a routine wellness examination for [place].  Hearing/Vision screen Hearing Screening - Comments:: No hearing aids. Scheduled for hearing test on 04/09/2022 Vision Screening - Comments:: Wears rx glasses - Scheduled for routine eye exams with [facility] at [place] issues and exercise activities discussed: Current Exercise Habits: Home exercise routine, Time (Minutes): 60, Frequency (Times/Week): 7, Weekly Exercise (Minutes/Week): 420, Intensity: Moderate, Exercise limited by: None identified   Goals Addressed             This Visit's Progress    My  goal for 2024 is to make it to 2025.        Depression Screen    03/28/2022    2:40 PM 03/18/2022    2:12 PM 03/22/2021    3:28 PM 11/15/2020    8:38 AM 11/02/2019    3:43 PM 10/19/2018    1:28 PM 05/13/2018    1:57 PM  PHQ 2/9 Scores  PHQ - 2 Score 0 0 0 0 0 0 0    Fall Risk    03/28/2022    2:40 PM 03/18/2022    2:12 PM 03/22/2021    3:28 PM 11/15/2020    8:37 AM 08/07/2020    4:09 PM  Fall Risk   Falls in the past year? 0 0 '1 1 1  '$ Number falls in past yr: 0  1 1 0  Injury with Fall? 0 0 0 1 0  Risk for fall due to : No Fall Risks No Fall Risks History of fall(s)    Follow up Falls evaluation completed Falls evaluation completed Falls evaluation completed      [place]:  Any stairs in or around the home? Yes ; has a stair lift If so, are there any without handrails? No  Home free of loose throw rugs in walkways, pet beds, electrical cords, etc? Yes  Adequate lighting in your home to reduce risk of falls? Yes   ASSISTIVE DEVICES UTILIZED TO PREVENT FALLS:  Life alert? No  Use of a cane, walker or w/c? Yes  Grab bars in the bathroom? Yes  Shower chair or bench in shower? Yes  Elevated toilet seat or a handicapped toilet? Yes   TIMED UP AND GO: Phone Visit  Was the test  performed? No .   Cognitive Function:        04/03/2022    3:07 PM  6CIT Screen  What Year? 0 points  What month? 0 points  What time? 0 points  Count back from 20 0 points  Months in reverse 0 points  Repeat phrase 0 points  Total Score 0 points    Immunizations Immunization History  Administered Date(s) Administered   Fluad Quad(high Dose 65+) 12/13/2018, 02/07/2020   Influenza Split 12/13/2010   Influenza Whole 12/09/2007, 12/13/2009   Influenza, High Dose Seasonal PF 12/10/2014, 11/04/2016, 11/10/2017   Influenza, Seasonal, Injecte, Preservative Fre 12/08/2012   Influenza,inj,Quad PF,6+ Mos 11/07/2015   Influenza-Unspecified 12/08/2012, 02/06/2021,  12/23/2021   PFIZER(Purple Top)SARS-COV-2 Vaccination 04/03/2019, 05/31/2019   Pneumococcal Conjugate-13 03/22/2013   Pneumococcal Polysaccharide-23 01/08/2006, 03/25/2011   Td 05/17/2009   Tdap 02/07/2020    TDAP status: Up to date  Flu Vaccine status: Up to date  Pneumococcal vaccine status: Up to date  Covid-19 vaccine status: Completed vaccines  Qualifies for Shingles Vaccine? Yes   Zostavax completed No   Shingrix Completed?: No.    Education has been provided regarding the importance of this vaccine. Patient has been advised to call insurance company to determine out of pocket expense if they have not yet received this vaccine. Advised may also receive vaccine at local pharmacy or Health Dept. Verbalized acceptance and understanding.  Screening Tests Health Maintenance  Topic Date Due   OPHTHALMOLOGY EXAM  01/01/2021   COVID-19 Vaccine (3 - Pfizer risk series) 04/03/2022 (Originally 06/28/2019)   Zoster Vaccines- Shingrix (1 of 2) 06/17/2022 (Originally 07/25/1959)   Diabetic kidney evaluation - eGFR measurement  03/19/2023   Diabetic kidney evaluation - Urine ACR  03/19/2023   FOOT EXAM  03/19/2023   Medicare Annual Wellness (AWV)  04/04/2023   DTaP/Tdap/Td (3 - Td or Tdap) 02/06/2030   Pneumonia Vaccine 44+ Years old  Completed   INFLUENZA VACCINE  Completed   HPV VACCINES  Aged Out    Health Maintenance  Health Maintenance Due  Topic Date Due   OPHTHALMOLOGY EXAM  01/01/2021    Colorectal cancer screening: No longer required.   Lung Cancer Screening: (Low Dose CT Chest recommended if Age 66-80 years, 30 pack-year currently smoking OR have quit w/in 15years.) does not qualify.   Lung Cancer Screening Referral: no  Additional Screening:  Hepatitis C Screening: does not qualify; Completed; no  Vision Screening: Recommended annual ophthalmology exams for early detection of glaucoma and other disorders of the eye. Is the patient up to date with their annual eye  exam?  Yes  Who is the provider or what is the name of the office in which the patient attends annual eye exams? Scheduled for [facility] on 04/08/2022. If pt is not established with a provider, would they like to be referred to a provider to establish care? No .   Dental Screening: Recommended annual dental exams for proper oral hygiene  Community Resource Referral / Chronic Care Management: CRR required this visit?  yes; referral to podiatry  CCM required this visit?  No      Plan:     I have personally reviewed and noted the following in the patient's chart:   Medical and social history Use of alcohol, tobacco or illicit drugs  Current medications and supplements including opioid prescriptions. Patient is not currently taking opioid prescriptions. Functional ability and status Nutritional status Physical activity Advanced directives List of other physicians Hospitalizations, surgeries, and  ER visits in previous 12 months Vitals Screenings to include cognitive, depression, and falls Referrals and appointments  In addition, I have reviewed and discussed with patient certain preventive protocols, quality metrics, and best practice recommendations. A written personalized care plan for preventive services as well as general preventive health recommendations were provided to patient.     Sheral Flow, LPN   07/12/1362   Nurse Notes: N/A

## 2022-04-03 NOTE — Patient Instructions (Addendum)
[name] , Thank you for taking time to come for your Medicare Wellness Visit. I appreciate your ongoing commitment to your health goals. Please review the following plan we discussed and let me know if I can assist you in the future.   These are the goals we discussed:  Goals      My goal for 2024 is to make it to 2025.        This is a list of the screening recommended for you and due dates:  Health Maintenance  Topic Date Due   Eye exam for diabetics  01/01/2021   COVID-19 Vaccine (3 - Pfizer risk series) 04/03/2022*   Zoster (Shingles) Vaccine (1 of 2) 06/17/2022*   Yearly kidney function blood test for diabetes  03/19/2023   Yearly kidney health urinalysis for diabetes  03/19/2023   Complete foot exam   03/19/2023   Medicare Annual Wellness Visit  04/04/2023   DTaP/Tdap/Td vaccine (3 - Td or Tdap) 02/06/2030   Pneumonia Vaccine  Completed   Flu Shot  Completed   HPV Vaccine  Aged Out  *Topic was postponed. The date shown is not the original due date.    Advanced directives: Yes  Conditions/risks identified: Yes; Type II Diabetes Mellitus  Next appointment: Follow up in one year for your annual wellness visit.   Preventive Care 8 Years and Older, Male  Preventive care refers to lifestyle choices and visits with your health care provider that can promote health and wellness. What does preventive care include? A yearly physical exam. This is also called an annual well check. Dental exams once or twice a year. Routine eye exams. Ask your health care provider how often you should have your eyes checked. Personal lifestyle choices, including: Daily care of your teeth and gums. Regular physical activity. Eating a healthy diet. Avoiding tobacco and drug use. Limiting alcohol use. Practicing safe sex. Taking low doses of aspirin every day. Taking vitamin and mineral supplements as recommended by your health care provider. What happens during an annual well check? The  services and screenings done by your health care provider during your annual well check will depend on your age, overall health, lifestyle risk factors, and family history of disease. Counseling  Your health care provider may ask you questions about your: Alcohol use. Tobacco use. Drug use. Emotional well-being. Home and relationship well-being. Sexual activity. Eating habits. History of falls. Memory and ability to understand (cognition). Work and work Statistician. Screening  You may have the following tests or measurements: Height, weight, and BMI. Blood pressure. Lipid and cholesterol levels. These may be checked every 5 years, or more frequently if you are over 15 years old. Skin check. Lung cancer screening. You may have this screening every year starting at age 24 if you have a 30-pack-year history of smoking and currently smoke or have quit within the past 15 years. Fecal occult blood test (FOBT) of the stool. You may have this test every year starting at age 13. Flexible sigmoidoscopy or colonoscopy. You may have a sigmoidoscopy every 5 years or a colonoscopy every 10 years starting at age 71. Prostate cancer screening. Recommendations will vary depending on your family history and other risks. Hepatitis C blood test. Hepatitis B blood test. Sexually transmitted disease (STD) testing. Diabetes screening. This is done by checking your blood sugar (glucose) after you have not eaten for a while (fasting). You may have this done every 1-3 years. Abdominal aortic aneurysm (AAA) screening. You may need  this if you are a current or former smoker. Osteoporosis. You may be screened starting at age 108 if you are at high risk. Talk with your health care provider about your test results, treatment options, and if necessary, the need for more tests. Vaccines  Your health care provider may recommend certain vaccines, such as: Influenza vaccine. This is recommended every year. Tetanus,  diphtheria, and acellular pertussis (Tdap, Td) vaccine. You may need a Td booster every 10 years. Zoster vaccine. You may need this after age 63. Pneumococcal 13-valent conjugate (PCV13) vaccine. One dose is recommended after age 23. Pneumococcal polysaccharide (PPSV23) vaccine. One dose is recommended after age 12. Talk to your health care provider about which screenings and vaccines you need and how often you need them. This information is not intended to replace advice given to you by your health care provider. Make sure you discuss any questions you have with your health care provider. Document Released: 03/23/2015 Document Revised: 11/14/2015 Document Reviewed: 12/26/2014 Elsevier Interactive Patient Education  2017 [place] Prevention in the Home Falls can cause injuries. They can happen to people of all ages. There are many things you can do to make your home safe and to help prevent falls. What can I do on the outside of my home? Regularly fix the edges of walkways and driveways and fix any cracks. Remove anything that might make you trip as you walk through a door, such as a raised step or threshold. Trim any bushes or trees on the path to your home. Use bright outdoor lighting. Clear any walking paths of anything that might make someone trip, such as rocks or tools. Regularly check to see if handrails are loose or broken. Make sure that both sides of any steps have handrails. Any raised decks and porches should have guardrails on the edges. Have any leaves, snow, or ice cleared regularly. Use sand or salt on walking paths during winter. Clean up any spills in your garage right away. This includes oil or grease spills. What can I do in the bathroom? Use night lights. Install grab bars by the toilet and in the tub and shower. Do not use towel bars as grab bars. Use non-skid mats or decals in the tub or shower. If you need to sit down in the shower, use a plastic,  non-slip stool. Keep the floor dry. Clean up any water that spills on the floor as soon as it happens. Remove soap buildup in the tub or shower regularly. Attach bath mats securely with double-sided non-slip rug tape. Do not have throw rugs and other things on the floor that can make you trip. What can I do in the bedroom? Use night lights. Make sure that you have a light by your bed that is easy to reach. Do not use any sheets or blankets that are too big for your bed. They should not hang down onto the floor. Have a firm chair that has side arms. You can use this for support while you get dressed. Do not have throw rugs and other things on the floor that can make you trip. What can I do in the kitchen? Clean up any spills right away. Avoid walking on wet floors. Keep items that you use a lot in easy-to-reach places. If you need to reach something above you, use a strong step stool that has a grab bar. Keep electrical cords out of the way. Do not use floor polish or wax that makes floors  slippery. If you must use wax, use non-skid floor wax. Do not have throw rugs and other things on the floor that can make you trip. What can I do with my stairs? Do not leave any items on the stairs. Make sure that there are handrails on both sides of the stairs and use them. Fix handrails that are broken or loose. Make sure that handrails are as long as the stairways. Check any carpeting to make sure that it is firmly attached to the stairs. Fix any carpet that is loose or worn. Avoid having throw rugs at the top or bottom of the stairs. If you do have throw rugs, attach them to the floor with carpet tape. Make sure that you have a light switch at the top of the stairs and the bottom of the stairs. If you do not have them, ask someone to add them for you. What else can I do to help prevent falls? Wear shoes that: Do not have high heels. Have rubber bottoms. Are comfortable and fit you well. Are closed  at the toe. Do not wear sandals. If you use a stepladder: Make sure that it is fully opened. Do not climb a closed stepladder. Make sure that both sides of the stepladder are locked into place. Ask someone to hold it for you, if possible. Clearly mark and make sure that you can see: Any grab bars or handrails. First and last steps. Where the edge of each step is. Use tools that help you move around (mobility aids) if they are needed. These include: Canes. Walkers. Scooters. Crutches. Turn on the lights when you go into a dark area. Replace any light bulbs as soon as they burn out. Set up your furniture so you have a clear path. Avoid moving your furniture around. If any of your floors are uneven, fix them. If there are any pets around you, be aware of where they are. Review your medicines with your doctor. Some medicines can make you feel dizzy. This can increase your chance of falling. Ask your doctor what other things that you can do to help prevent falls. This information is not intended to replace advice given to you by your health care provider. Make sure you discuss any questions you have with your health care provider. Document Released: 12/21/2008 Document Revised: 08/02/2015 Document Reviewed: 03/31/2014 Elsevier Interactive Patient Education  2017 [employer].

## 2022-04-07 ENCOUNTER — Ambulatory Visit (INDEPENDENT_AMBULATORY_CARE_PROVIDER_SITE_OTHER): Payer: PRIVATE HEALTH INSURANCE | Admitting: Podiatry

## 2022-04-07 DIAGNOSIS — M79675 Pain in left toe(s): Secondary | ICD-10-CM | POA: Diagnosis not present

## 2022-04-07 DIAGNOSIS — B351 Tinea unguium: Secondary | ICD-10-CM | POA: Diagnosis not present

## 2022-04-07 DIAGNOSIS — E1142 Type 2 diabetes mellitus with diabetic polyneuropathy: Secondary | ICD-10-CM | POA: Diagnosis not present

## 2022-04-07 DIAGNOSIS — M79674 Pain in right toe(s): Secondary | ICD-10-CM

## 2022-04-07 NOTE — Progress Notes (Signed)
  Subjective:  Patient ID: Nathan Russo, male    DOB: [birth year],  MRN: [id number]  Chief Complaint  Patient presents with   Nail Problem    Nail Trim , BG - 81 this morning     82 y.o. male presents with the above complaint. History confirmed with patient. Patient presenting with pain related to dystrophic thickened elongated nails. Patient is unable to trim own nails related to nail dystrophy and/or mobility issues. Patient does  have a history of T2DM.  He is also presenting for routine diabetic foot exam Objective:  Physical Exam: warm, good capillary refill nail exam onychomycosis of the toenails, onycholysis, and dystrophic nails DP pulses 2/4  palpable, PT 1/4  pulses palpable, and protective sensation intact monofilament exam intact to all sites plantar forefoot bilateral feet Left Foot:  Pain with palpation of nails due to elongation and dystrophic growth.  Right Foot: Pain with palpation of nails due to elongation and dystrophic growth.   Assessment:   1. Pain due to onychomycosis of toenails of both feet   2. DM type 2 with diabetic peripheral neuropathy ([place])      Plan:  Patient was evaluated and treated and all questions answered.  #Patient educated on diabetes. Discussed proper diabetic foot care and discussed risks and complications of disease. Educated patient in depth on reasons to return to the office immediately should he/she discover anything concerning or new on the feet. All questions answered. Discussed proper shoes as well.   #Onychomycosis with pain  -Nails palliatively debrided as below. -Educated on self-care  Procedure: Nail Debridement Rationale: Pain Type of Debridement: manual, sharp debridement. Instrumentation: Nail nipper, rotary burr. Number of Nails: 10  Return in about 3 months (around 07/07/2022) for [facility].         Everitt Amber, DPM [facility]

## 2022-04-08 ENCOUNTER — Ambulatory Visit: Payer: Medicare (Managed Care) | Admitting: Neurology

## 2022-04-15 ENCOUNTER — Ambulatory Visit: Payer: PRIVATE HEALTH INSURANCE | Admitting: Podiatry

## 2022-04-16 ENCOUNTER — Encounter: Payer: Self-pay | Admitting: Neurology

## 2022-04-16 ENCOUNTER — Ambulatory Visit (INDEPENDENT_AMBULATORY_CARE_PROVIDER_SITE_OTHER): Payer: Medicare (Managed Care) | Admitting: Neurology

## 2022-04-16 VITALS — BP 162/58 | HR 59 | Ht 72.0 in | Wt 219.0 lb

## 2022-04-16 DIAGNOSIS — R2689 Other abnormalities of gait and mobility: Secondary | ICD-10-CM

## 2022-04-16 NOTE — Patient Instructions (Signed)
We will refer you to Nathan Russo for balance therapy

## 2022-04-16 NOTE — Progress Notes (Signed)
[facility] Clinic Note - Initial Visit   Date: 04/16/2022   Nathan Russo MRN: [id number] DOB: [birth year]   Dear [name]:  Thank you for your kind referral of Nathan Russo for consultation of neuropathy. Although his history is well known to you, please allow [place] to reiterate it for the purpose of our medical record. The patient was accompanied to the clinic by self.   Nathan Russo is a 82 y.o. right-handed male with well-controlled diabetes mellitus, hypertension, hyperlipidemia, GERD, thyroid cancer s/p resection and radiation (2012),  paroxsymal atrial fibrillation, and SVT presenting for evaluation of worsening bilateral leg numbness.   IMPRESSION/PLAN: Peripheral neuropathy, contributed by diabetes.  Symptoms are predominately numbness in the feet and sensory ataxia.  No weakness.  Prior NCS/EMG from 2021 confirmed the presence of axonal neuropathy affecting the lower extremities.  His neurological examination shows a distal predominant large fiber peripheral neuropathy. I had extensive discussion with the patient regarding the pathogenesis, etiology, management, and natural course of neuropathy. Neuropathy tends to be slowly progressive and management is symptomatic.  The fact that he is noticing worsening numbness is consistent with the natural course of neuropathy.  I do not appreciate any findings to suggest symptoms to be stemming from lumbosacral radiculopathy.  Fortunately, he does not have pain so there is no role for medications.  For imbalance, I will refer him for out-patient physical therapy for balance training. Patient educated on daily foot inspection, fall prevention, and safety precautions around the home.  Return to clinic as needed  ------------------------------------------------------------- History of present illness: He has history of neuropathy affecting the feet for several years.  Prior NCS/EMG in 2021 confirmed sensorimotor  neuropathy affecting the legs.  Over the past few months, he has noticed worsening numbness over the sole of the feet and great toe.  No tingling or pain in the feet.  His balance is also poor and attributes this to left knee pain. He denies recurrent low back or shooting pain into the legs.  He has some imbalance and has fallen a few times over the past year.  He has been walking with a cane for the past 5-6 years.    He lives with wife in a two level home.  He has a stair lift. 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  Out-side paper records, electronic medical record, and images have been reviewed where available and summarized as:  NCS/EMG of the legs 06/15/2019: The electrophysiologic findings are consistent with a sensorimotor axonal polyneuropathy affecting the lower extremities.   Lab Results  Component Value Date   HGBA1C 6.4 03/18/2022   Lab Results  Component Value Date   VITAMINB12 >1500 (H) 03/18/2022   Lab Results  Component Value Date   TSH 5.94 (H) 03/18/2022   Lab Results  Component Value Date   ESRSEDRATE 9 05/17/2009    Past Medical History:  Diagnosis Date   Abdominal pain, epigastric 04/11/2010   BELCHING 04/23/2010   BRADYCARDIA, CHRONIC 10/24/2006   CARPAL TUNNEL SYNDROME, BILATERAL 02/07/2009   CHEST PAIN-UNSPECIFIED 09/05/2008   CHOLELITHIASIS 04/22/2010   Cholelithiasis 06/13/2010   COLONIC POLYPS, HX OF 04/28/2007   DEGENERATIVE JOINT DISEASE, RIGHT KNEE 10/24/2006   Depression 02/23/2011   DIABETES MELLITUS, TYPE II 04/28/2007   Dizziness and giddiness 12/19/2009   Elevated PSA 06/13/2010   FATIGUE 04/28/2007   GERD 02/11/2009   Headache(784.0) 12/19/2009   HYPERLIPIDEMIA 04/28/2007   HYPERTENSION 10/21/2006  NECK MASS 02/07/2010   OBESITY 10/24/2006   OTITIS MEDIA, ACUTE, LEFT 12/26/2009   PHIMOSIS 02/07/2009   S/P laparoscopic cholecystectomy 10/31/2010   Thyroid cancer ([name]) 06/13/2010   THYROID NODULE 02/07/2010    Past Surgical  History:  Procedure Laterality Date   CHOLECYSTECTOMY     left knee surgery     right wrist surgury     THYROID SURGERY       Medications:  Outpatient Encounter Medications as of 04/16/2022  Medication Sig   amiodarone (PACERONE) 200 MG tablet Take by mouth.   apixaban (ELIQUIS) 2.5 MG TABS tablet Take 1 tablet (2.5 mg total) by mouth 2 (two) times daily. (Patient taking differently: Take 5 mg by mouth 2 (two) times daily.)   Ascorbic Acid (VITAMIN C) 1000 MG tablet Take 1,000 mg by mouth daily.   augmented betamethasone dipropionate (DIPROLENE-AF) 0.05 % ointment betamethasone, augmented 0.05 % topical ointment  APPLY OINTMENT TOPICALLY TO EACH EAR AS NEEDED FOR ITCHING   Blood Glucose Monitoring Suppl (ONETOUCH VERIO FLEX SYSTEM) w/Device KIT Use as directed three times per day E11.9   Calcium Carb-Cholecalciferol [phone] MG-UNIT CAPS Take by mouth.   Cinnamon 500 MG capsule Take 500 mg by mouth 2 (two) times daily.   cyanocobalamin 1000 MCG tablet Take by mouth.   Diclofenac Sodium (PENNSAID) 2 % SOLN [place] 20 mg/gram/actuation (2 %) topical soln in metered-dose pump  APPLY 2 PUMPS (40 MG) TO THE AFFECTED KNEE BY TOPICAL ROUTE 2 TIMES PER DAY   ezetimibe (ZETIA) 10 MG tablet    Flaxseed, Linseed, 1000 MG CAPS Take 1 capsule by mouth daily.   Garlic Oil 017 MG TABS Take by mouth 2 (two) times daily.   Ginger, Zingiber officinalis, (GINGER PO) Take by mouth 4 (four) times daily.   Glucosamine-Chondroit-Vit C-Mn (GLUCOSAMINE CHONDROITIN COMPLX) CAPS Take by mouth daily.   glucose blood (ONETOUCH VERIO) test strip Use as instructed three times per day E11.9   hydrocortisone 2.5 % ointment Apply topically 2 (two) times daily.   isosorbide mononitrate (IMDUR) 60 MG 24 hr tablet Take by mouth.   ketoconazole (NIZORAL) 2 % cream ketoconazole 2 % topical cream  APPLY CREAM TOPICALLY ONCE DAILY FOR 30 DAYS   Lancets MISC Use as directed three times per day E11.9   levothyroxine  (SYNTHROID) 112 MCG tablet Take 1 tablet (112 mcg total) by mouth daily.   losartan (COZAAR) 50 MG tablet    metFORMIN (GLUCOPHAGE-XR) 500 MG 24 hr tablet Take 1 tablet by mouth twice daily   metoprolol succinate (TOPROL-XL) 25 MG 24 hr tablet    Multiple Vitamin (MULTIVITAMIN) capsule Take 1 capsule by mouth daily.   nitroGLYCERIN (NITROSTAT) 0.4 MG SL tablet    Omega-3 Fatty Acids (FISH OIL) 1000 MG CAPS Take by mouth 2 (two) times daily.   pantoprazole (PROTONIX) 40 MG tablet Take 2 tablets by mouth once daily   prasugrel (EFFIENT) 10 MG TABS tablet Take 10 mg by mouth daily.   VITAMIN D, CHOLECALCIFEROL, PO Take by mouth daily.   No facility-administered encounter medications on file as of 04/16/2022.    Allergies:  Allergies  Allergen Reactions   Diphenoxylate-Atropine Other (See Comments)   Other Other (See Comments) and Hives    Causes body to ache Causes body to ache   Sulfa Antibiotics Other (See Comments)    As child almost died   Ace Inhibitors     REACTION: cough   Atenolol     REACTION: bradycardia  Codeine Other (See Comments)    Head spins "wild"   Levaquin [Levofloxacin]     Interferes with flecainide   Lovastatin     REACTION: myalygros   Morphine Nausea And Vomiting   Morphine And Related Other (See Comments)   Nsaids     Heart patient   Statins Other (See Comments)    Causes body to ache   Sulfamethoxazole Other (See Comments)    Childhood unknown reaction   Sulfasalazine Other (See Comments)    As child almost died   Tramadol     Ants crawling all over    Family History: Family History  Problem Relation Age of Onset   Heart attack Brother 93   Diabetes Brother    Cancer Brother        colon   Colon cancer Brother 56   Hypertension Mother    Arthritis Mother    Goiter Mother    Heart attack Father 72   Cancer Sister        breast   Hypothyroidism Sister     Social History: Social History   Tobacco Use   Smoking status: Former    Smokeless tobacco: Never  Scientific laboratory technician Use: Never used  Substance Use Topics   Alcohol use: No    Alcohol/week: 0.0 standard drinks of alcohol   Drug use: No   Social History   Social History Narrative   Are you right handed or left handed? Right Handed   Are you currently employed ?    What is your current occupation?   Do you live at home alone? No   Who lives with you? Lives with wife.    What type of home do you live in: 1 story or 2 story? Lives in a two story home with a stair lift         Vital Signs:  BP (!) 162/58   Pulse (!) 59   Ht 6' (1.829 m)   Wt 219 lb (99.3 kg)   SpO2 97%   BMI 29.70 kg/m   Neurological Exam: MENTAL STATUS including orientation to time, place, person, recent and remote memory, attention span and concentration, language, and fund of knowledge is normal.  Speech is not dysarthric.  CRANIAL NERVES: II:  No visual field defects.     III-IV-VI: Pupils equal round and reactive to light.  Normal conjugate, extra-ocular eye movements in all directions of gaze.  No nystagmus.  No ptosis.   V:  Normal facial sensation.    VII:  Normal facial symmetry and movements.   VIII:  Normal hearing and vestibular function.   IX-X:  Normal palatal movement.   XI:  Normal shoulder shrug and head rotation.   XII:  Normal tongue strength and range of motion, no deviation or fasciculation.  MOTOR:  No atrophy, fasciculations or abnormal movements.  No pronator drift.   Upper Extremity:  Right  Left  Deltoid  5/5   5/5   Biceps  5/5   5/5   Triceps  5/5   5/5   Wrist extensors  5/5   5/5   Wrist flexors  5/5   5/5   Finger extensors  5/5   5/5   Finger flexors  5/5   5/5   Dorsal interossei  5/5   5/5   Abductor pollicis  5/5   5/5   Tone ([place] scale)  0  0   Lower Extremity:  Right  Left  Hip flexors  5/5   5/5   Knee extensors  5/5   5/5   Dorsiflexors  5/5   5/5   Plantarflexors  5/5   5/5   Toe extensors  5/5   5/5   Toe flexors  5/5    5/5   Tone ([place]worth scale)  0  0   MSRs:                                           Right        Left brachioradialis 2+  2+  biceps 2+  2+  triceps 2+  2+  patellar 1+  1+  ankle jerk 0  0  Hoffman no  no  plantar response down  down   SENSORY:  Normal temperature and pin prick throughout.  Reduced vibration below the ankles.    Romberg's sign present.   COORDINATION/GAIT: Normal finger-to- nose-finger.  Intact rapid alternating movements bilaterally. Gait appears wide-based, antalgic; he is more stable when using a cane  Total time spent reviewing records, interview, history/exam, documentation, and coordination of care on day of encounter:  40 min   Thank you for allowing me to participate in patient's care.  If I can answer any additional questions, I would be pleased to do so.    Sincerely,    Nalanie Winiecki K. Posey Pronto, DO

## 2022-05-11 DIAGNOSIS — I499 Cardiac arrhythmia, unspecified: Secondary | ICD-10-CM | POA: Diagnosis not present

## 2022-05-11 DIAGNOSIS — R6889 Other general symptoms and signs: Secondary | ICD-10-CM | POA: Diagnosis not present

## 2022-05-11 DIAGNOSIS — R1111 Vomiting without nausea: Secondary | ICD-10-CM | POA: Diagnosis not present

## 2022-05-11 DIAGNOSIS — R072 Precordial pain: Secondary | ICD-10-CM | POA: Diagnosis not present

## 2022-05-11 DIAGNOSIS — R079 Chest pain, unspecified: Secondary | ICD-10-CM | POA: Diagnosis not present

## 2022-05-11 DIAGNOSIS — I4519 Other right bundle-branch block: Secondary | ICD-10-CM | POA: Diagnosis not present

## 2022-05-11 DIAGNOSIS — Z743 Need for continuous supervision: Secondary | ICD-10-CM | POA: Diagnosis not present

## 2022-05-11 DIAGNOSIS — I440 Atrioventricular block, first degree: Secondary | ICD-10-CM | POA: Diagnosis not present
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
## 2022-05-13 ENCOUNTER — Telehealth: Payer: Self-pay

## 2022-05-13 NOTE — Transitions of Care (Post Inpatient/ED Visit) (Signed)
   05/13/2022  Name: CODEN Dawson MRN: [id number] DOB: 06/08/1940  Today's TOC FU Call Status: Today's TOC FU Call Status:: Successful TOC FU Call Competed TOC FU Call Complete Date: 05/13/22  Transition Care Management Follow-up Telephone Call Date of Discharge: 05/12/22 Type of Discharge: Emergency Department Reason for ED Visit: Other: (percordial pain) How have you been since you were released from the hospital?: Better Any questions or concerns?: No  Items Reviewed: Did you receive and understand the discharge instructions provided?: Yes Medications obtained and verified?: Yes (Medications Reviewed) Any new allergies since your discharge?: No Dietary orders reviewed?: Yes Do you have support at home?: Yes People in Home: spouse  Home Care and Equipment/Supplies: [place] Ordered?: NA Any new equipment or medical supplies ordered?: NA  Functional Questionnaire: Do you need assistance with bathing/showering or dressing?: No Do you need assistance with meal preparation?: No Do you need assistance with eating?: No Do you have difficulty maintaining continence: No Do you need assistance with getting out of bed/getting out of a chair/moving?: No Do you have difficulty managing or taking your medications?: No  Folllow up appointments reviewed: PCP Follow-up appointment confirmed?: NA Specialist Hospital Follow-up appointment confirmed?: Yes Date of Specialist follow-up appointment?: 05/15/22 Follow-Up Specialty Provider:: Dr Desma Maxim Do you need transportation to your follow-up appointment?: No Do you understand care options if your condition(s) worsen?: Yes-patient verbalized understanding    [place], [place] Nurse Health Advisor Direct Dial [phone]

## 2022-05-15 ENCOUNTER — Other Ambulatory Visit: Payer: Self-pay | Admitting: Internal Medicine

## 2022-05-15 ENCOUNTER — Telehealth: Payer: Self-pay | Admitting: Internal Medicine

## 2022-05-15 DIAGNOSIS — I25118 Atherosclerotic heart disease of native coronary artery with other forms of angina pectoris: Secondary | ICD-10-CM | POA: Diagnosis not present

## 2022-05-15 DIAGNOSIS — I213 ST elevation (STEMI) myocardial infarction of unspecified site: Secondary | ICD-10-CM | POA: Diagnosis not present

## 2022-05-15 DIAGNOSIS — I420 Dilated cardiomyopathy: Secondary | ICD-10-CM | POA: Diagnosis not present

## 2022-05-15 DIAGNOSIS — I255 Ischemic cardiomyopathy: Secondary | ICD-10-CM | POA: Diagnosis not present

## 2022-05-15 DIAGNOSIS — I10 Essential (primary) hypertension: Secondary | ICD-10-CM | POA: Diagnosis not present

## 2022-05-15 DIAGNOSIS — I4891 Unspecified atrial fibrillation: Secondary | ICD-10-CM | POA: Diagnosis not present

## 2022-05-15 DIAGNOSIS — E118 Type 2 diabetes mellitus with unspecified complications: Secondary | ICD-10-CM | POA: Diagnosis not present

## 2022-05-15 MED ORDER — EZETIMIBE 10 MG PO TABS: 10.0000 mg | ORAL_TABLET | Freq: Every day | ORAL | 3 refills | Status: DC

## 2022-05-15 NOTE — Telephone Encounter (Signed)
Please refill as per office routine med refill policy (all routine meds to be refilled for 3 mo or monthly (per pt preference) up to one year from last visit, then month to month grace period for 3 mo, then further med refills will have to be denied) ? ?

## 2022-05-15 NOTE — Telephone Encounter (Signed)
Patient called and said that he went to the heart doctor today and they suggested that he might need to be on cholesterol medication.  He just wants to know your thoughts on this.  He said his cholesterol was only 108.  Please call patient:  [phone]

## 2022-05-15 NOTE — Telephone Encounter (Signed)
Notified pt w/ MD response.Marland KitchenJohny Russo

## 2022-05-15 NOTE — Telephone Encounter (Signed)
Ok for zetia 10 mg as he has not been able to take the statins before - I will sned

## 2022-05-19 DIAGNOSIS — I440 Atrioventricular block, first degree: Secondary | ICD-10-CM | POA: Diagnosis not present

## 2022-05-19 DIAGNOSIS — R001 Bradycardia, unspecified: Secondary | ICD-10-CM | POA: Diagnosis not present

## 2022-05-19 DIAGNOSIS — I444 Left anterior fascicular block: Secondary | ICD-10-CM | POA: Diagnosis not present

## 2022-05-19 DIAGNOSIS — N179 Acute kidney failure, unspecified: Secondary | ICD-10-CM | POA: Diagnosis not present

## 2022-05-19 DIAGNOSIS — Z79899 Other long term (current) drug therapy: Secondary | ICD-10-CM | POA: Diagnosis not present

## 2022-05-19 DIAGNOSIS — R079 Chest pain, unspecified: Secondary | ICD-10-CM | POA: Diagnosis not present

## 2022-05-19 DIAGNOSIS — I10 Essential (primary) hypertension: Secondary | ICD-10-CM | POA: Diagnosis not present

## 2022-05-21 ENCOUNTER — Ambulatory Visit: Payer: Medicare (Managed Care) | Admitting: Internal Medicine

## 2022-05-21 ENCOUNTER — Telehealth: Payer: Self-pay

## 2022-05-21 NOTE — Transitions of Care (Post Inpatient/ED Visit) (Signed)
   05/21/2022  Name: Nathan Russo MRN: [id number] DOB: 24-Jan-1941  Today's TOC FU Call Status: Today's TOC FU Call Status:: Successful TOC FU Call Competed TOC FU Call Complete Date: 05/21/22  Transition Care Management Follow-up Telephone Call Date of Discharge: 05/20/22 Discharge Facility: [facility] Type of Discharge: Emergency Department Reason for ED Visit: Cardiac Conditions, Other: (hypertension) How have you been since you were released from the hospital?: Better Any questions or concerns?: No  Items Reviewed: Did you receive and understand the discharge instructions provided?: Yes Medications obtained and verified?: Yes (Medications Reviewed) Any new allergies since your discharge?: No Dietary orders reviewed?: Yes Do you have support at home?: Yes People in Home: spouse  Home Care and Equipment/Supplies: [place] Ordered?: NA Any new equipment or medical supplies ordered?: NA  Functional Questionnaire: Do you need assistance with bathing/showering or dressing?: No Do you need assistance with meal preparation?: No Do you need assistance with eating?: No Do you have difficulty maintaining continence: No Do you need assistance with getting out of bed/getting out of a chair/moving?: No Do you have difficulty managing or taking your medications?: No  Folllow up appointments reviewed: PCP Follow-up appointment confirmed?: No (patient cancelled appt , going to see cardiologist first) [facility] Follow-up appointment confirmed?: No Date of Specialist follow-up appointment?:  (waiting on cardio to call them with appt) Follow-Up Specialty Provider:: cardio Reason Specialist Follow-Up Not Confirmed: Patient has Specialist Provider Number and will Call for Appointment Do you need transportation to your follow-up appointment?: No Do you understand care options if your condition(s) worsen?: Yes-patient verbalized understanding    SIGNATURE  Juanda Crumble, [place] Nurse Health Advisor Direct Dial [phone]

## 2022-05-26 DIAGNOSIS — I499 Cardiac arrhythmia, unspecified: Secondary | ICD-10-CM | POA: Diagnosis not present

## 2022-05-26 DIAGNOSIS — G4733 Obstructive sleep apnea (adult) (pediatric): Secondary | ICD-10-CM | POA: Diagnosis not present

## 2022-05-30 DIAGNOSIS — I200 Unstable angina: Secondary | ICD-10-CM | POA: Diagnosis not present

## 2022-05-30 DIAGNOSIS — Z87891 Personal history of nicotine dependence: Secondary | ICD-10-CM | POA: Diagnosis not present

## 2022-05-30 DIAGNOSIS — I10 Essential (primary) hypertension: Secondary | ICD-10-CM | POA: Diagnosis not present

## 2022-05-30 DIAGNOSIS — R079 Chest pain, unspecified: Secondary | ICD-10-CM | POA: Diagnosis not present

## 2022-05-30 DIAGNOSIS — R001 Bradycardia, unspecified: Secondary | ICD-10-CM | POA: Diagnosis not present

## 2022-05-30 DIAGNOSIS — M25519 Pain in unspecified shoulder: Secondary | ICD-10-CM | POA: Diagnosis not present

## 2022-05-30 DIAGNOSIS — I201 Angina pectoris with documented spasm: Secondary | ICD-10-CM | POA: Diagnosis not present

## 2022-05-30 DIAGNOSIS — I440 Atrioventricular block, first degree: Secondary | ICD-10-CM | POA: Diagnosis not present

## 2022-05-30 DIAGNOSIS — R0789 Other chest pain: Secondary | ICD-10-CM | POA: Diagnosis not present

## 2022-06-02 ENCOUNTER — Ambulatory Visit: Payer: Medicare (Managed Care) | Admitting: Internal Medicine

## 2022-06-02 ENCOUNTER — Ambulatory Visit (INDEPENDENT_AMBULATORY_CARE_PROVIDER_SITE_OTHER): Payer: Medicare (Managed Care) | Admitting: Internal Medicine

## 2022-06-02 ENCOUNTER — Encounter: Payer: Self-pay | Admitting: Internal Medicine

## 2022-06-02 ENCOUNTER — Telehealth: Payer: Self-pay

## 2022-06-02 VITALS — BP 124/74 | HR 58 | Temp 97.7°F | Ht 72.0 in | Wt 211.0 lb

## 2022-06-02 DIAGNOSIS — R079 Chest pain, unspecified: Secondary | ICD-10-CM | POA: Insufficient documentation

## 2022-06-02 DIAGNOSIS — I10 Essential (primary) hypertension: Secondary | ICD-10-CM

## 2022-06-02 DIAGNOSIS — E1140 Type 2 diabetes mellitus with diabetic neuropathy, unspecified: Secondary | ICD-10-CM | POA: Diagnosis not present

## 2022-06-02 MED ORDER — AMLODIPINE BESYLATE 5 MG PO TABS: 5.0000 mg | ORAL_TABLET | Freq: Every day | ORAL | 3 refills | Status: DC

## 2022-06-02 MED ORDER — LOSARTAN POTASSIUM 100 MG PO TABS: 100.0000 mg | ORAL_TABLET | Freq: Every day | ORAL | 3 refills | Status: AC

## 2022-06-02 NOTE — Transitions of Care (Post Inpatient/ED Visit) (Signed)
   06/02/2022  Name: Nathan Russo MRN: [id number] DOB: [birth year]  Today's TOC FU Call Status: Today's TOC FU Call Status:: Successful TOC FU Call Competed TOC FU Call Complete Date: 06/02/22  Transition Care Management Follow-up Telephone Call Date of Discharge: 05/30/22 Discharge Facility: [facility] Type of Discharge: Emergency Department Reason for ED Visit: Other: (other chest pain) How have you been since you were released from the hospital?: Same Any questions or concerns?: Yes Patient Questions/Concerns:: question about medications Patient Questions/Concerns Addressed: Notified Provider of Patient Questions/Concerns  Items Reviewed: Did you receive and understand the discharge instructions provided?: Yes Medications obtained and verified?: Yes (Medications Reviewed) Any new allergies since your discharge?: No Dietary orders reviewed?: Yes Do you have support at home?: Yes People in Home: spouse  Home Care and Equipment/Supplies: [place] Ordered?: NA Any new equipment or medical supplies ordered?: NA  Functional Questionnaire: Do you need assistance with bathing/showering or dressing?: No Do you need assistance with meal preparation?: No Do you need assistance with eating?: No Do you have difficulty maintaining continence: No Do you need assistance with getting out of bed/getting out of a chair/moving?: No Do you have difficulty managing or taking your medications?: No  Follow up appointments reviewed: PCP Follow-up appointment confirmed?: Yes Date of PCP follow-up appointment?: 06/02/22 Follow-up Provider: Dr Jenny Reichmann [facility] Follow-up appointment confirmed?: Yes Date of Specialist follow-up appointment?: 06/16/22 Follow-Up Specialty Provider:: cardio Do you need transportation to your follow-up appointment?: No Do you understand care options if your condition(s) worsen?: Yes-patient verbalized understanding    SIGNATURE  Juanda Crumble, [place] Nurse Health Advisor Direct Dial [phone]

## 2022-06-02 NOTE — Patient Instructions (Signed)
Ok to add amlodipine 5 mg per day  Please continue all other medications as before, and refills have been done if requested.  Please have the pharmacy call with any other refills you may need.  Please continue your efforts at being more active, low cholesterol diet, and weight control.  Please keep your appointments with your specialists as you may have planned

## 2022-06-02 NOTE — Assessment & Plan Note (Signed)
BP Readings from Last 3 Encounters:  06/02/22 124/74  04/16/22 (!) 162/58  03/28/22 128/62   Fair control here but has documented elevated BP at home, pt to continue medical treatment losartan 100 qd, toprol xl 25 qd, and add amlodipine 5 qd

## 2022-06-02 NOTE — Progress Notes (Signed)
Patient ID: Nathan Russo, male   DOB: Mar 10, 1941, 82 y.o.   MRN: [id number]        Chief Complaint: follow up hosp visit 3/22 with htn, chest pain, dm       HPI:  Nathan Russo is a 82 y.o. male here after above, Pt denies chest pain, increased sob or doe, wheezing, orthopnea, PND, increased LE swelling, palpitations, dizziness or syncope.   Pt denies polydipsia, polyuria, or new focal neuro s/s.    Pt denies fever, wt loss, night sweats, loss of appetite, or other constitutional symptoms  BP has been running in the 150/s at home and has been accurate in the past.  Good compliance with all meds.         Wt Readings from Last 3 Encounters:  06/02/22 211 lb (95.7 kg)  04/16/22 219 lb (99.3 kg)  04/03/22 217 lb (98.4 kg)   BP Readings from Last 3 Encounters:  06/02/22 124/74  04/16/22 (!) 162/58  03/28/22 128/62         Past Medical History:  Diagnosis Date   Abdominal pain, epigastric 04/11/2010   BELCHING 04/23/2010   BRADYCARDIA, CHRONIC 10/24/2006   CARPAL TUNNEL SYNDROME, BILATERAL 02/07/2009   CHEST PAIN-UNSPECIFIED 09/05/2008   CHOLELITHIASIS 04/22/2010   Cholelithiasis 06/13/2010   COLONIC POLYPS, HX OF 04/28/2007   DEGENERATIVE JOINT DISEASE, RIGHT KNEE 10/24/2006   Depression 02/23/2011   DIABETES MELLITUS, TYPE II 04/28/2007   Dizziness and giddiness 12/19/2009   Elevated PSA 06/13/2010   FATIGUE 04/28/2007   GERD 02/11/2009   Headache(784.0) 12/19/2009   HYPERLIPIDEMIA 04/28/2007   HYPERTENSION 10/21/2006   NECK MASS 02/07/2010   OBESITY 10/24/2006   OTITIS MEDIA, ACUTE, LEFT 12/26/2009   PHIMOSIS 02/07/2009   S/P laparoscopic cholecystectomy 10/31/2010   Thyroid cancer ([place]) 06/13/2010   THYROID NODULE 02/07/2010   Past Surgical History:  Procedure Laterality Date   CHOLECYSTECTOMY     left knee surgery     right wrist surgury     THYROID SURGERY      reports that he has quit smoking. He has never used smokeless tobacco. He reports that he does not drink alcohol and does not use  drugs. family history includes Arthritis in his mother; Cancer in his brother and sister; Colon cancer (age of onset: 7) in his brother; Diabetes in his brother; Goiter in his mother; Heart attack (age of onset: 89) in his brother; Heart attack (age of onset: 30) in his father; Hypertension in his mother; Hypothyroidism in his sister. Allergies  Allergen Reactions   Diphenoxylate-Atropine Other (See Comments)   Other Other (See Comments) and Hives    Causes body to ache Causes body to ache   Sulfa Antibiotics Other (See Comments)    As child almost died   Ace Inhibitors     REACTION: cough   Atenolol     REACTION: bradycardia   Codeine Other (See Comments)    Head spins "wild"   Levaquin [Levofloxacin]     Interferes with flecainide   Lovastatin     REACTION: myalygros   Morphine Nausea And Vomiting   Morphine And Related Other (See Comments)   Nsaids     Heart patient   Statins Other (See Comments)    Causes body to ache   Sulfamethoxazole Other (See Comments)    Childhood unknown reaction   Sulfasalazine Other (See Comments)    As child almost died   Tramadol     Ants crawling all  over   Current Outpatient Medications on File Prior to Visit  Medication Sig Dispense Refill   amiodarone (PACERONE) 200 MG tablet Take by mouth.     apixaban (ELIQUIS) 2.5 MG TABS tablet Take 1 tablet (2.5 mg total) by mouth 2 (two) times daily. (Patient taking differently: Take 5 mg by mouth 2 (two) times daily.) 180 tablet 3   Ascorbic Acid (VITAMIN C) 1000 MG tablet Take 1,000 mg by mouth daily.     augmented betamethasone dipropionate (DIPROLENE-AF) 0.05 % ointment betamethasone, augmented 0.05 % topical ointment  APPLY OINTMENT TOPICALLY TO EACH EAR AS NEEDED FOR ITCHING     Blood Glucose Monitoring Suppl (ONETOUCH VERIO FLEX SYSTEM) w/Device KIT Use as directed three times per day E11.9 1 kit 0   Calcium Carb-Cholecalciferol [phone] MG-UNIT CAPS Take by mouth.     ciclopirox (PENLAC) 8  % solution      Cinnamon 500 MG capsule Take 500 mg by mouth 2 (two) times daily.     cyanocobalamin 1000 MCG tablet Take by mouth.     Diclofenac Sodium (PENNSAID) 2 % SOLN Pennsaid 20 mg/gram/actuation (2 %) topical soln in metered-dose pump  APPLY 2 PUMPS (40 MG) TO THE AFFECTED KNEE BY TOPICAL ROUTE 2 TIMES PER DAY     ezetimibe (ZETIA) 10 MG tablet Take 1 tablet (10 mg total) by mouth daily. 90 tablet 3   Flaxseed, Linseed, 1000 MG CAPS Take 1 capsule by mouth daily.     Garlic Oil [id number] MG TABS Take by mouth 2 (two) times daily.     Ginger, Zingiber officinalis, (GINGER PO) Take by mouth 4 (four) times daily.     Glucosamine-Chondroit-Vit C-Mn (GLUCOSAMINE CHONDROITIN COMPLX) CAPS Take by mouth daily.     Glucosamine-Chondroitin 250-200 MG TABS Take 1 tablet by mouth at bedtime.     glucose blood (ONETOUCH VERIO) test strip Use as instructed three times per day E11.9 300 each 12   hydrocortisone 2.5 % ointment Apply topically 2 (two) times daily.     isosorbide mononitrate (IMDUR) 60 MG 24 hr tablet Take by mouth.     ketoconazole (NIZORAL) 2 % cream ketoconazole 2 % topical cream  APPLY CREAM TOPICALLY ONCE DAILY FOR 30 DAYS     Lancets MISC Use as directed three times per day E11.9 300 each 3   levothyroxine (SYNTHROID) 112 MCG tablet Take 1 tablet (112 mcg total) by mouth daily. 90 tablet 3   metFORMIN (GLUCOPHAGE-XR) 500 MG 24 hr tablet Take 1 tablet by mouth twice daily 180 tablet 3   metoprolol succinate (TOPROL-XL) 25 MG 24 hr tablet      Multiple Vitamin (MULTIVITAMIN) capsule Take 1 capsule by mouth daily.     nitroGLYCERIN (NITROSTAT) 0.4 MG SL tablet      Omega-3 Fatty Acids (FISH OIL) 1000 MG CAPS Take by mouth 2 (two) times daily.     pantoprazole (PROTONIX) 40 MG tablet Take 2 tablets by mouth once daily 180 tablet 3   prasugrel (EFFIENT) 10 MG TABS tablet Take 10 mg by mouth daily.     triamcinolone (KENALOG) 0.025 % cream Apply topically.     VITAMIN D, CHOLECALCIFEROL,  PO Take by mouth daily.     No current facility-administered medications on file prior to visit.        ROS:  All others reviewed and negative.  Objective        PE:  BP 124/74   Pulse (!) 58   Temp 97.7 F (  36.5 C) (Oral)   Ht 6' (1.829 m)   Wt 211 lb (95.7 kg)   SpO2 97%   BMI 28.62 kg/m                 Constitutional: Pt appears in NAD               HENT: Head: NCAT.                Right Ear: External ear normal.                 Left Ear: External ear normal.                Eyes: . Pupils are equal, round, and reactive to light. Conjunctivae and EOM are normal               Nose: without d/c or deformity               Neck: Neck supple. Gross normal ROM               Cardiovascular: Normal rate and regular rhythm.                 Pulmonary/Chest: Effort normal and breath sounds without rales or wheezing.                Abd:  Soft, NT, ND, + BS, no organomegaly               Neurological: Pt is alert. At baseline orientation, motor grossly intact               Skin: Skin is warm. No rashes, no other new lesions, LE edema - none               Psychiatric: Pt behavior is normal without agitation   Micro: none  Cardiac tracings I have personally interpreted today:  none  Pertinent Radiological findings (summarize): none   Lab Results  Component Value Date   WBC 7.4 03/18/2022   HGB 11.7 (L) 03/18/2022   HCT 35.9 (L) 03/18/2022   PLT 321.0 03/18/2022   GLUCOSE 155 (H) 03/18/2022   CHOL 138 03/18/2022   TRIG 234.0 (H) 03/18/2022   HDL 42.20 03/18/2022   LDLDIRECT 80.0 03/18/2022   LDLCALC 47 02/18/2021   ALT 20 03/18/2022   AST 25 03/18/2022   NA 139 03/18/2022   K 4.3 03/18/2022   CL 104 03/18/2022   CREATININE 1.54 (H) 03/18/2022   BUN 28 (H) 03/18/2022   CO2 27 03/18/2022   TSH 5.94 (H) 03/18/2022   PSA 3.59 05/08/2017   INR 1.0 08/22/2008   HGBA1C 6.4 03/18/2022   MICROALBUR 0.9 03/18/2022   Assessment/Plan:  BERKELEY GOCHNOUR is a 82 y.o. White or  Caucasian [1] male with  has a past medical history of Abdominal pain, epigastric (04/11/2010), BELCHING (04/23/2010), BRADYCARDIA, CHRONIC (10/24/2006), CARPAL TUNNEL SYNDROME, BILATERAL (02/07/2009), CHEST PAIN-UNSPECIFIED (09/05/2008), CHOLELITHIASIS (04/22/2010), Cholelithiasis (06/13/2010), COLONIC POLYPS, HX OF (04/28/2007), DEGENERATIVE JOINT DISEASE, RIGHT KNEE (10/24/2006), Depression (02/23/2011), DIABETES MELLITUS, TYPE II (04/28/2007), Dizziness and giddiness (12/19/2009), Elevated PSA (06/13/2010), FATIGUE (04/28/2007), GERD (02/11/2009), Headache(784.0) (12/19/2009), HYPERLIPIDEMIA (04/28/2007), HYPERTENSION (10/21/2006), NECK MASS (02/07/2010), OBESITY (10/24/2006), OTITIS MEDIA, ACUTE, LEFT (12/26/2009), PHIMOSIS (02/07/2009), S/P laparoscopic cholecystectomy (10/31/2010), Thyroid cancer ([place]) (06/13/2010), and THYROID NODULE (02/07/2010).  Diabetes mellitus ([place]) Lab Results  Component Value Date   HGBA1C 6.4 03/18/2022   Stable, pt to continue current medical treatment metfomrin ER 500 gm -  1 bid    Essential hypertension BP Readings from Last 3 Encounters:  06/02/22 124/74  04/16/22 (!) 162/58  03/28/22 128/62   Fair control here but has documented elevated BP at home, pt to continue medical treatment losartan 100 qd, toprol xl 25 qd, and add amlodipine 5 qd   Chest pain None recent, pt plans to f/u with cardiology as well, has appt he believes in may 2024 Followup: Return in about 4 months (around 09/24/2022).  Cathlean Cower, MD 06/02/2022 8:13 PM [facility]

## 2022-06-02 NOTE — Assessment & Plan Note (Signed)
Lab Results  Component Value Date   HGBA1C 6.4 03/18/2022   Stable, pt to continue current medical treatment metfomrin ER 500 gm - 1 bid

## 2022-06-02 NOTE — Assessment & Plan Note (Signed)
None recent, pt plans to f/u with cardiology as well, has appt he believes in may 2024

## 2022-06-10 ENCOUNTER — Telehealth: Payer: Self-pay | Admitting: Internal Medicine

## 2022-06-10 NOTE — Telephone Encounter (Signed)
Ok to let pt know, amlodipine does not cause shortness of breath, so ok to continue.  SOB much more likely due to something else, so consider going for ROV here, or UC or ED depending on how severe he thinks it is

## 2022-06-10 NOTE — Telephone Encounter (Signed)
Patient said amLODipine (NORVASC) 5 MG tablet is causing him to have shortness of breath. He would like to know if he should continue taking it prior to his appointment on 06/12/22. He would like a call back at [phone].

## 2022-06-11 ENCOUNTER — Encounter: Payer: Self-pay | Admitting: Internal Medicine

## 2022-06-11 NOTE — Telephone Encounter (Signed)
Notified pt w/MD response. Pt states he made appt for tomorrow will discuss at ov.Marland KitchenJohny Russo

## 2022-06-12 ENCOUNTER — Ambulatory Visit: Payer: Medicare (Managed Care) | Admitting: Internal Medicine

## 2022-06-16 ENCOUNTER — Encounter: Payer: Self-pay | Admitting: Internal Medicine

## 2022-06-16 ENCOUNTER — Ambulatory Visit (INDEPENDENT_AMBULATORY_CARE_PROVIDER_SITE_OTHER): Payer: Medicare (Managed Care) | Admitting: Internal Medicine

## 2022-06-16 VITALS — BP 114/62 | HR 53 | Temp 97.8°F | Ht 72.0 in | Wt 212.0 lb

## 2022-06-16 DIAGNOSIS — E7800 Pure hypercholesterolemia, unspecified: Secondary | ICD-10-CM | POA: Diagnosis not present

## 2022-06-16 DIAGNOSIS — I10 Essential (primary) hypertension: Secondary | ICD-10-CM | POA: Diagnosis not present

## 2022-06-16 DIAGNOSIS — N184 Chronic kidney disease, stage 4 (severe): Secondary | ICD-10-CM | POA: Diagnosis not present

## 2022-06-16 DIAGNOSIS — E1140 Type 2 diabetes mellitus with diabetic neuropathy, unspecified: Secondary | ICD-10-CM

## 2022-06-16 MED ORDER — AMLODIPINE BESYLATE 2.5 MG PO TABS: 2.5000 mg | ORAL_TABLET | Freq: Every day | ORAL | 3 refills | Status: DC

## 2022-06-16 NOTE — Progress Notes (Signed)
Patient ID: Nathan Russo, male   DOB: August 10, 1940, 82 y.o.   MRN: [id number]        Chief Complaint: follow up HTN, HLD and hyperglycemia, ckd3a       HPI:  Nathan Russo is a 82 y.o. male here with c/o unusual fatigue and dizziness with taking the 5 mg amlodipine and lower Bps. Pt denies chest pain, increased sob or doe, wheezing, orthopnea, PND, increased LE swelling, palpitations, dizziness or syncope.   Pt denies polydipsia, polyuria, or new focal neuro s/s.    Pt denies fever, wt loss, night sweats, loss of appetite, or other constitutional symptoms           Wt Readings from Last 3 Encounters:  06/16/22 212 lb (96.2 kg)  06/02/22 211 lb (95.7 kg)  04/16/22 219 lb (99.3 kg)   BP Readings from Last 3 Encounters:  06/16/22 114/62  06/02/22 124/74  04/16/22 (!) 162/58         Past Medical History:  Diagnosis Date   Abdominal pain, epigastric 04/11/2010   BELCHING 04/23/2010   BRADYCARDIA, CHRONIC 10/24/2006   CARPAL TUNNEL SYNDROME, BILATERAL 02/07/2009   CHEST PAIN-UNSPECIFIED 09/05/2008   CHOLELITHIASIS 04/22/2010   Cholelithiasis 06/13/2010   COLONIC POLYPS, HX OF 04/28/2007   DEGENERATIVE JOINT DISEASE, RIGHT KNEE 10/24/2006   Depression 02/23/2011   DIABETES MELLITUS, TYPE II 04/28/2007   Dizziness and giddiness 12/19/2009   Elevated PSA 06/13/2010   FATIGUE 04/28/2007   GERD 02/11/2009   Headache(784.0) 12/19/2009   HYPERLIPIDEMIA 04/28/2007   HYPERTENSION 10/21/2006   NECK MASS 02/07/2010   OBESITY 10/24/2006   OTITIS MEDIA, ACUTE, LEFT 12/26/2009   PHIMOSIS 02/07/2009   S/P laparoscopic cholecystectomy 10/31/2010   Thyroid cancer 06/13/2010   THYROID NODULE 02/07/2010   Past Surgical History:  Procedure Laterality Date   CHOLECYSTECTOMY     left knee surgery     right wrist surgury     THYROID SURGERY      reports that he has quit smoking. He has never used smokeless tobacco. He reports that he does not drink alcohol and does not use drugs. family history includes Arthritis in  his mother; Cancer in his brother and sister; Colon cancer (age of onset: 6860) in his brother; Diabetes in his brother; Goiter in his mother; Heart attack (age of onset: 677) in his brother; Heart attack (age of onset: 1688) in his father; Hypertension in his mother; Hypothyroidism in his sister. Allergies  Allergen Reactions   Diphenoxylate-Atropine Other (See Comments)   Other Other (See Comments) and Hives    Causes body to ache Causes body to ache   Sulfa Antibiotics Other (See Comments)    As child almost died   Ace Inhibitors     REACTION: cough   Atenolol     REACTION: bradycardia   Codeine Other (See Comments)    Head spins "wild"   Levaquin [Levofloxacin]     Interferes with flecainide   Lovastatin     REACTION: myalygros   Morphine Nausea And Vomiting   Morphine And Related Other (See Comments)   Nsaids     Heart patient   Statins Other (See Comments)    Causes body to ache   Sulfamethoxazole Other (See Comments)    Childhood unknown reaction   Sulfasalazine Other (See Comments)    As child almost died   Tramadol     Ants crawling all over   Current Outpatient Medications on File Prior to Visit  Medication Sig Dispense Refill   amiodarone (PACERONE) 200 MG tablet Take by mouth.     apixaban (ELIQUIS) 2.5 MG TABS tablet Take 1 tablet (2.5 mg total) by mouth 2 (two) times daily. (Patient taking differently: Take 5 mg by mouth 2 (two) times daily.) 180 tablet 3   Ascorbic Acid (VITAMIN C) 1000 MG tablet Take 1,000 mg by mouth daily.     augmented betamethasone dipropionate (DIPROLENE-AF) 0.05 % ointment betamethasone, augmented 0.05 % topical ointment  APPLY OINTMENT TOPICALLY TO EACH EAR AS NEEDED FOR ITCHING     Blood Glucose Monitoring Suppl (ONETOUCH VERIO FLEX SYSTEM) w/Device KIT Use as directed three times per day E11.9 1 kit 0   Calcium Carb-Cholecalciferol [phone] MG-UNIT CAPS Take by mouth.     ciclopirox (PENLAC) 8 % solution      Cinnamon 500 MG capsule Take  500 mg by mouth 2 (two) times daily.     cyanocobalamin 1000 MCG tablet Take by mouth.     Diclofenac Sodium (PENNSAID) 2 % SOLN Pennsaid 20 mg/gram/actuation (2 %) topical soln in metered-dose pump  APPLY 2 PUMPS (40 MG) TO THE AFFECTED KNEE BY TOPICAL ROUTE 2 TIMES PER DAY     ezetimibe (ZETIA) 10 MG tablet Take 1 tablet (10 mg total) by mouth daily. 90 tablet 3   Flaxseed, Linseed, 1000 MG CAPS Take 1 capsule by mouth daily.     Garlic Oil 500 MG TABS Take by mouth 2 (two) times daily.     Ginger, Zingiber officinalis, (GINGER PO) Take by mouth 4 (four) times daily.     Glucosamine-Chondroit-Vit C-Mn (GLUCOSAMINE CHONDROITIN COMPLX) CAPS Take by mouth daily.     Glucosamine-Chondroitin 250-200 MG TABS Take 1 tablet by mouth at bedtime.     glucose blood (ONETOUCH VERIO) test strip Use as instructed three times per day E11.9 300 each 12   hydrocortisone 2.5 % ointment Apply topically 2 (two) times daily.     isosorbide mononitrate (IMDUR) 60 MG 24 hr tablet Take by mouth.     ketoconazole (NIZORAL) 2 % cream ketoconazole 2 % topical cream  APPLY CREAM TOPICALLY ONCE DAILY FOR 30 DAYS     Lancets MISC Use as directed three times per day E11.9 300 each 3   levothyroxine (SYNTHROID) 112 MCG tablet Take 1 tablet (112 mcg total) by mouth daily. 90 tablet 3   losartan (COZAAR) 100 MG tablet Take 1 tablet (100 mg total) by mouth daily. 90 tablet 3   metFORMIN (GLUCOPHAGE-XR) 500 MG 24 hr tablet Take 1 tablet by mouth twice daily 180 tablet 3   metoprolol succinate (TOPROL-XL) 25 MG 24 hr tablet      Multiple Vitamin (MULTIVITAMIN) capsule Take 1 capsule by mouth daily.     nitroGLYCERIN (NITROSTAT) 0.4 MG SL tablet      Omega-3 Fatty Acids (FISH OIL) 1000 MG CAPS Take by mouth 2 (two) times daily.     pantoprazole (PROTONIX) 40 MG tablet Take 2 tablets by mouth once daily 180 tablet 3   prasugrel (EFFIENT) 10 MG TABS tablet Take 10 mg by mouth daily.     triamcinolone (KENALOG) 0.025 % cream  Apply topically.     VITAMIN D, CHOLECALCIFEROL, PO Take by mouth daily.     No current facility-administered medications on file prior to visit.        ROS:  All others reviewed and negative.  Objective        PE:  BP 114/62   Pulse [name])  53   Temp 97.8 F (36.6 C) (Oral)   Ht 6' (1.829 m)   Wt 212 lb (96.2 kg)   SpO2 98%   BMI 28.75 kg/m                 Constitutional: Pt appears in NAD               HENT: Head: NCAT.                Right Ear: External ear normal.                 Left Ear: External ear normal.                Eyes: . Pupils are equal, round, and reactive to light. Conjunctivae and EOM are normal               Nose: without d/c or deformity               Neck: Neck supple. Gross normal ROM               Cardiovascular: Normal rate and regular rhythm.                 Pulmonary/Chest: Effort normal and breath sounds without rales or wheezing.                Abd:  Soft, NT, ND, + BS, no organomegaly               Neurological: Pt is alert. At baseline orientation, motor grossly intact               Skin: Skin is warm. No rashes, no other new lesions, LE edema - none               Psychiatric: Pt behavior is normal without agitation   Micro: none  Cardiac tracings I have personally interpreted today:  none  Pertinent Radiological findings (summarize): none   Lab Results  Component Value Date   WBC 7.4 03/18/2022   HGB 11.7 (L) 03/18/2022   HCT 35.9 (L) 03/18/2022   PLT 321.0 03/18/2022   GLUCOSE 155 (H) 03/18/2022   CHOL 138 03/18/2022   TRIG 234.0 (H) 03/18/2022   HDL 42.20 03/18/2022   LDLDIRECT 80.0 03/18/2022   LDLCALC 47 02/18/2021   ALT 20 03/18/2022   AST 25 03/18/2022   NA 139 03/18/2022   K 4.3 03/18/2022   CL 104 03/18/2022   CREATININE 1.54 (H) 03/18/2022   BUN 28 (H) 03/18/2022   CO2 27 03/18/2022   TSH 5.94 (H) 03/18/2022   PSA 3.59 05/08/2017   INR 1.0 08/22/2008   HGBA1C 6.4 03/18/2022   MICROALBUR 0.9 03/18/2022    Assessment/Plan:  Nathan Russo is a 82 y.o. White or Caucasian [1] male with  has a past medical history of Abdominal pain, epigastric (04/11/2010), BELCHING (04/23/2010), BRADYCARDIA, CHRONIC (10/24/2006), CARPAL TUNNEL SYNDROME, BILATERAL (02/07/2009), CHEST PAIN-UNSPECIFIED (09/05/2008), CHOLELITHIASIS (04/22/2010), Cholelithiasis (06/13/2010), COLONIC POLYPS, HX OF (04/28/2007), DEGENERATIVE JOINT DISEASE, RIGHT KNEE (10/24/2006), Depression (02/23/2011), DIABETES MELLITUS, TYPE II (04/28/2007), Dizziness and giddiness (12/19/2009), Elevated PSA (06/13/2010), FATIGUE (04/28/2007), GERD (02/11/2009), Headache(784.0) (12/19/2009), HYPERLIPIDEMIA (04/28/2007), HYPERTENSION (10/21/2006), NECK MASS (02/07/2010), OBESITY (10/24/2006), OTITIS MEDIA, ACUTE, LEFT (12/26/2009), PHIMOSIS (02/07/2009), S/P laparoscopic cholecystectomy (10/31/2010), Thyroid cancer (06/13/2010), and THYROID NODULE (02/07/2010).  Diabetes mellitus (HCC) Lab Results  Component Value Date   HGBA1C 6.4 03/18/2022   Stable, pt to continue current medical treatment  metformin ER 500 gm bid   Essential hypertension BP Readings from Last 3 Encounters:  06/16/22 114/62  06/02/22 124/74  04/16/22 (!) 162/58   overcontrolled, pt to decrease the amlodipine to 2.5 mg qd   Hyperlipidemia Lab Results  Component Value Date   LDLCALC 47 02/18/2021   Stable, pt to continue current statin zetia 10 mg qd   CKD (chronic kidney disease) stage 4, GFR 15-29 ml/min (HCC) Lab Results  Component Value Date   CREATININE 1.54 (H) 03/18/2022   Stable overall, cont to avoid nephrotoxins  Followup: Return in about 3 months (around 09/24/2022), or if symptoms worsen or fail to improve.  Oliver Barre, MD 06/16/2022 8:02 PM [facility] - [facility]

## 2022-06-16 NOTE — Assessment & Plan Note (Signed)
Lab Results  Component Value Date   LDLCALC 47 02/18/2021   Stable, pt to continue current statin zetia 10 mg qd  

## 2022-06-16 NOTE — Assessment & Plan Note (Signed)
Lab Results  Component Value Date   HGBA1C 6.4 03/18/2022   Stable, pt to continue current medical treatment metformin ER 500 gm bid

## 2022-06-16 NOTE — Patient Instructions (Signed)
Ok to decrease the amlodipine to 2.5 gm per day  Please continue all other medications as before  Please have the pharmacy call with any other refills you may need.  Please continue your efforts at being more active, low cholesterol diet, and weight control.  Please keep your appointments with your specialists as you may have planned

## 2022-06-16 NOTE — Assessment & Plan Note (Signed)
BP Readings from Last 3 Encounters:  06/16/22 114/62  06/02/22 124/74  04/16/22 (!) 162/58   overcontrolled, pt to decrease the amlodipine to 2.5 mg qd

## 2022-06-16 NOTE — Assessment & Plan Note (Signed)
Lab Results  Component Value Date   CREATININE 1.54 (H) 03/18/2022   Stable overall, cont to avoid nephrotoxins

## 2022-07-08 ENCOUNTER — Ambulatory Visit: Payer: PRIVATE HEALTH INSURANCE | Admitting: Podiatry

## 2022-07-10 DIAGNOSIS — J40 Bronchitis, not specified as acute or chronic: Secondary | ICD-10-CM | POA: Diagnosis not present

## 2022-07-10 DIAGNOSIS — R0981 Nasal congestion: Secondary | ICD-10-CM | POA: Diagnosis not present

## 2022-07-10 DIAGNOSIS — R059 Cough, unspecified: Secondary | ICD-10-CM | POA: Diagnosis not present

## 2022-07-10 DIAGNOSIS — R5383 Other fatigue: Secondary | ICD-10-CM | POA: Diagnosis not present

## 2022-07-25 DIAGNOSIS — J324 Chronic pansinusitis: Secondary | ICD-10-CM | POA: Diagnosis not present

## 2022-09-10 DIAGNOSIS — E119 Type 2 diabetes mellitus without complications: Secondary | ICD-10-CM | POA: Diagnosis not present

## 2022-09-10 DIAGNOSIS — R070 Pain in throat: Secondary | ICD-10-CM | POA: Diagnosis not present

## 2022-09-10 DIAGNOSIS — R1084 Generalized abdominal pain: Secondary | ICD-10-CM | POA: Diagnosis not present

## 2022-09-24 ENCOUNTER — Ambulatory Visit (INDEPENDENT_AMBULATORY_CARE_PROVIDER_SITE_OTHER): Payer: Medicare (Managed Care) | Admitting: Internal Medicine

## 2022-09-24 VITALS — BP 140/68 | HR 55 | Temp 98.5°F | Ht 72.0 in | Wt 209.0 lb

## 2022-09-24 DIAGNOSIS — E538 Deficiency of other specified B group vitamins: Secondary | ICD-10-CM

## 2022-09-24 DIAGNOSIS — E1140 Type 2 diabetes mellitus with diabetic neuropathy, unspecified: Secondary | ICD-10-CM | POA: Diagnosis not present

## 2022-09-24 DIAGNOSIS — E890 Postprocedural hypothyroidism: Secondary | ICD-10-CM

## 2022-09-24 DIAGNOSIS — F32A Depression, unspecified: Secondary | ICD-10-CM

## 2022-09-24 DIAGNOSIS — I10 Essential (primary) hypertension: Secondary | ICD-10-CM

## 2022-09-24 DIAGNOSIS — E559 Vitamin D deficiency, unspecified: Secondary | ICD-10-CM | POA: Diagnosis not present

## 2022-09-24 DIAGNOSIS — E7800 Pure hypercholesterolemia, unspecified: Secondary | ICD-10-CM | POA: Diagnosis not present

## 2022-09-24 LAB — CBC WITH DIFFERENTIAL/PLATELET
Basophils Absolute: 0 10*3/uL (ref 0.0–0.1)
Basophils Relative: 0.8 % (ref 0.0–3.0)
Eosinophils Absolute: 0.1 10*3/uL (ref 0.0–0.7)
Eosinophils Relative: 2 % (ref 0.0–5.0)
HCT: 35 % — ABNORMAL LOW (ref 39.0–52.0)
Hemoglobin: 11.4 g/dL — ABNORMAL LOW (ref 13.0–17.0)
Lymphocytes Relative: 18.2 % (ref 12.0–46.0)
Lymphs Abs: 1.1 10*3/uL (ref 0.7–4.0)
MCHC: 32.6 g/dL (ref 30.0–36.0)
MCV: 84.3 fl (ref 78.0–100.0)
Monocytes Absolute: 0.6 10*3/uL (ref 0.1–1.0)
Monocytes Relative: 10.8 % (ref 3.0–12.0)
Neutro Abs: 4 10*3/uL (ref 1.4–7.7)
Neutrophils Relative %: 68.2 % (ref 43.0–77.0)
Platelets: 286 10*3/uL (ref 150.0–400.0)
RBC: 4.15 Mil/uL — ABNORMAL LOW (ref 4.22–5.81)
RDW: 16.8 % — ABNORMAL HIGH (ref 11.5–15.5)
WBC: 5.8 10*3/uL (ref 4.0–10.5)

## 2022-09-24 LAB — URINALYSIS, ROUTINE W REFLEX MICROSCOPIC
Bilirubin Urine: NEGATIVE
Hgb urine dipstick: NEGATIVE
Ketones, ur: NEGATIVE
Leukocytes,Ua: NEGATIVE
Nitrite: NEGATIVE
RBC / HPF: NONE SEEN (ref 0–?)
Specific Gravity, Urine: 1.02 (ref 1.000–1.030)
Total Protein, Urine: NEGATIVE
Urine Glucose: NEGATIVE
Urobilinogen, UA: 0.2 (ref 0.0–1.0)
WBC, UA: NONE SEEN (ref 0–?)
pH: 6 (ref 5.0–8.0)

## 2022-09-24 LAB — HEPATIC FUNCTION PANEL
ALT: 14 U/L (ref 0–53)
AST: 19 U/L (ref 0–37)
Albumin: 3.8 g/dL (ref 3.5–5.2)
Alkaline Phosphatase: 74 U/L (ref 39–117)
Bilirubin, Direct: 0.2 mg/dL (ref 0.0–0.3)
Total Bilirubin: 0.9 mg/dL (ref 0.2–1.2)
Total Protein: 6.8 g/dL (ref 6.0–8.3)

## 2022-09-24 LAB — BASIC METABOLIC PANEL
BUN: 29 mg/dL — ABNORMAL HIGH (ref 6–23)
CO2: 24 mEq/L (ref 19–32)
Calcium: 9.4 mg/dL (ref 8.4–10.5)
Chloride: 105 mEq/L (ref 96–112)
Creatinine, Ser: 1.64 mg/dL — ABNORMAL HIGH (ref 0.40–1.50)
GFR: 38.27 mL/min — ABNORMAL LOW (ref >60.00)
Glucose, Bld: 117 mg/dL — ABNORMAL HIGH (ref 70–99)
Potassium: 3.8 mEq/L (ref 3.5–5.1)
Sodium: 138 mEq/L (ref 135–145)

## 2022-09-24 LAB — VITAMIN B12: Vitamin B-12: 1053 pg/mL — ABNORMAL HIGH (ref 211–911)

## 2022-09-24 LAB — LIPID PANEL
Cholesterol: 136 mg/dL (ref 0–200)
HDL: 40.8 mg/dL (ref >39.00)
LDL Cholesterol: 75 mg/dL (ref 0–99)
NonHDL: 94.76
Total CHOL/HDL Ratio: 3
Triglycerides: 101 mg/dL (ref 0.0–149.0)
VLDL: 20.2 mg/dL (ref 0.0–40.0)

## 2022-09-24 LAB — TSH: TSH: 4.42 u[IU]/mL (ref 0.35–5.50)

## 2022-09-24 LAB — MICROALBUMIN / CREATININE URINE RATIO
Creatinine,U: 88.4 mg/dL
Microalb Creat Ratio: 2.1 mg/g (ref 0.0–30.0)
Microalb, Ur: 1.8 mg/dL (ref 0.0–1.9)

## 2022-09-24 LAB — HEMOGLOBIN A1C: Hgb A1c MFr Bld: 6.3 % (ref 4.6–6.5)

## 2022-09-24 LAB — VITAMIN D 25 HYDROXY (VIT D DEFICIENCY, FRACTURES): VITD: 55.87 ng/mL (ref 30.00–100.00)

## 2022-09-24 MED ORDER — ACCU-CHEK GUIDE VI STRP: ORAL_STRIP | 12 refills | Status: DC

## 2022-09-24 NOTE — Progress Notes (Signed)
Patient ID: Nathan Russo, male   DOB: 1941/01/09, 82 y.o.   MRN: [id number]        Chief Complaint: follow up htn, ckd3a, dm, hld, low thyroid, depression       HPI:  Nathan Russo is a 82 y.o. male here overall doing ok, Pt denies chest pain, increased sob or doe, wheezing, orthopnea, PND, increased LE swelling, palpitations, dizziness or syncope.   Pt denies polydipsia, polyuria, or new focal neuro s/s.    Pt denies fever, wt loss, night sweats, loss of appetite, or other constitutional symptoms  Denies hyper or hypo thyroid symptoms such as voice, skin or hair change.  Sched for eye exam soon.   Denies worsening depressive symptoms, suicidal ideation, or panic; Wt Readings from Last 3 Encounters:  09/24/22 209 lb (94.8 kg)  06/16/22 212 lb (96.2 kg)  06/02/22 211 lb (95.7 kg)   BP Readings from Last 3 Encounters:  09/24/22 (!) 140/68  06/16/22 114/62  06/02/22 124/74         Past Medical History:  Diagnosis Date   Abdominal pain, epigastric 04/11/2010   BELCHING 04/23/2010   BRADYCARDIA, CHRONIC 10/24/2006   CARPAL TUNNEL SYNDROME, BILATERAL 02/07/2009   CHEST PAIN-UNSPECIFIED 09/05/2008   CHOLELITHIASIS 04/22/2010   Cholelithiasis 06/13/2010   COLONIC POLYPS, HX OF 04/28/2007   DEGENERATIVE JOINT DISEASE, RIGHT KNEE 10/24/2006   Depression 02/23/2011   DIABETES MELLITUS, TYPE II 04/28/2007   Dizziness and giddiness 12/19/2009   Elevated PSA 06/13/2010   FATIGUE 04/28/2007   GERD 02/11/2009   Headache(784.0) 12/19/2009   HYPERLIPIDEMIA 04/28/2007   HYPERTENSION 10/21/2006   NECK MASS 02/07/2010   OBESITY 10/24/2006   OTITIS MEDIA, ACUTE, LEFT 12/26/2009   PHIMOSIS 02/07/2009   S/P laparoscopic cholecystectomy 10/31/2010   Thyroid cancer (HCC) 06/13/2010   THYROID NODULE 02/07/2010   Past Surgical History:  Procedure Laterality Date   CHOLECYSTECTOMY     left knee surgery     right wrist surgury     THYROID SURGERY      reports that he has quit smoking. He has never used smokeless  tobacco. He reports that he does not drink alcohol and does not use drugs. family history includes Arthritis in his mother; Cancer in his brother and sister; Colon cancer (age of onset: 5) in his brother; Diabetes in his brother; Goiter in his mother; Heart attack (age of onset: 9) in his brother; Heart attack (age of onset: 62) in his father; Hypertension in his mother; Hypothyroidism in his sister. Allergies  Allergen Reactions   Diphenoxylate-Atropine Other (See Comments)   Other Other (See Comments) and Hives    Causes body to ache Causes body to ache   Sulfa Antibiotics Other (See Comments)    As child almost died   Ace Inhibitors     REACTION: cough   Atenolol     REACTION: bradycardia   Codeine Other (See Comments)    Head spins "wild"   Levaquin [Levofloxacin]     Interferes with flecainide   Lovastatin     REACTION: myalygros   Morphine Nausea And Vomiting   Morphine And Codeine Other (See Comments)   Nsaids     Heart patient   Statins Other (See Comments)    Causes body to ache   Sulfamethoxazole Other (See Comments)    Childhood unknown reaction   Sulfasalazine Other (See Comments)    As child almost died   Tramadol     Ants crawling all over  Current Outpatient Medications on File Prior to Visit  Medication Sig Dispense Refill   amiodarone (PACERONE) 200 MG tablet Take by mouth.     amLODipine (NORVASC) 2.5 MG tablet Take 1 tablet (2.5 mg total) by mouth daily. 90 tablet 3   apixaban (ELIQUIS) 2.5 MG TABS tablet Take 1 tablet (2.5 mg total) by mouth 2 (two) times daily. (Patient taking differently: Take 5 mg by mouth 2 (two) times daily.) 180 tablet 3   Ascorbic Acid (VITAMIN C) 1000 MG tablet Take 1,000 mg by mouth daily.     augmented betamethasone dipropionate (DIPROLENE-AF) 0.05 % ointment betamethasone, augmented 0.05 % topical ointment  APPLY OINTMENT TOPICALLY TO EACH EAR AS NEEDED FOR ITCHING     Blood Glucose Monitoring Suppl (ONETOUCH VERIO FLEX  SYSTEM) w/Device KIT Use as directed three times per day E11.9 1 kit 0   Calcium Carb-Cholecalciferol [phone] MG-UNIT CAPS Take by mouth.     ciclopirox (PENLAC) 8 % solution      Cinnamon 500 MG capsule Take 500 mg by mouth 2 (two) times daily.     cyanocobalamin 1000 MCG tablet Take by mouth.     Diclofenac Sodium (PENNSAID) 2 % SOLN Pennsaid 20 mg/gram/actuation (2 %) topical soln in metered-dose pump  APPLY 2 PUMPS (40 MG) TO THE AFFECTED KNEE BY TOPICAL ROUTE 2 TIMES PER DAY     ezetimibe (ZETIA) 10 MG tablet Take 1 tablet (10 mg total) by mouth daily. 90 tablet 3   Flaxseed, Linseed, 1000 MG CAPS Take 1 capsule by mouth daily.     Garlic Oil 500 MG TABS Take by mouth 2 (two) times daily.     Ginger, Zingiber officinalis, (GINGER PO) Take by mouth 4 (four) times daily.     Glucosamine-Chondroit-Vit C-Mn (GLUCOSAMINE CHONDROITIN COMPLX) CAPS Take by mouth daily.     Glucosamine-Chondroitin 250-200 MG TABS Take 1 tablet by mouth at bedtime.     hydrocortisone 2.5 % ointment Apply topically 2 (two) times daily.     isosorbide mononitrate (IMDUR) 60 MG 24 hr tablet Take by mouth.     ketoconazole (NIZORAL) 2 % cream ketoconazole 2 % topical cream  APPLY CREAM TOPICALLY ONCE DAILY FOR 30 DAYS     Lancets MISC Use as directed three times per day E11.9 300 each 3   levothyroxine (SYNTHROID) 112 MCG tablet Take 1 tablet (112 mcg total) by mouth daily. 90 tablet 3   losartan (COZAAR) 100 MG tablet Take 1 tablet (100 mg total) by mouth daily. 90 tablet 3   metFORMIN (GLUCOPHAGE-XR) 500 MG 24 hr tablet Take 1 tablet by mouth twice daily 180 tablet 3   metoprolol succinate (TOPROL-XL) 25 MG 24 hr tablet      Multiple Vitamin (MULTIVITAMIN) capsule Take 1 capsule by mouth daily.     nitroGLYCERIN (NITROSTAT) 0.4 MG SL tablet      Omega-3 Fatty Acids (FISH OIL) 1000 MG CAPS Take by mouth 2 (two) times daily.     pantoprazole (PROTONIX) 40 MG tablet Take 2 tablets by mouth once daily 180 tablet 3    prasugrel (EFFIENT) 10 MG TABS tablet Take 10 mg by mouth daily.     triamcinolone (KENALOG) 0.025 % cream Apply topically.     VITAMIN D, CHOLECALCIFEROL, PO Take by mouth daily.     No current facility-administered medications on file prior to visit.        ROS:  All others reviewed and negative.  Objective  PE:  BP (!) 140/68 (BP Location: Left Arm, Patient Position: Sitting, Cuff Size: Normal)   Pulse (!) 55   Temp 98.5 F (36.9 C) (Oral)   Ht 6' (1.829 m)   Wt 209 lb (94.8 kg)   SpO2 96%   BMI 28.35 kg/m                 Constitutional: Pt appears in NAD               HENT: Head: NCAT.                Right Ear: External ear normal.                 Left Ear: External ear normal.                Eyes: . Pupils are equal, round, and reactive to light. Conjunctivae and EOM are normal               Nose: without d/c or deformity               Neck: Neck supple. Gross normal ROM               Cardiovascular: Normal rate and regular rhythm.                 Pulmonary/Chest: Effort normal and breath sounds without rales or wheezing.                Abd:  Soft, NT, ND, + BS, no organomegaly               Neurological: Pt is alert. At baseline orientation, motor grossly intact               Skin: Skin is warm. No rashes, no other new lesions, LE edema - none               Psychiatric: Pt behavior is normal without agitation   Micro: none  Cardiac tracings I have personally interpreted today:  none  Pertinent Radiological findings (summarize): none   Lab Results  Component Value Date   WBC 5.8 09/24/2022   HGB 11.4 (L) 09/24/2022   HCT 35.0 (L) 09/24/2022   PLT 286.0 09/24/2022   GLUCOSE 117 (H) 09/24/2022   CHOL 136 09/24/2022   TRIG 101.0 09/24/2022   HDL 40.80 09/24/2022   LDLDIRECT 80.0 03/18/2022   LDLCALC 75 09/24/2022   ALT 14 09/24/2022   AST 19 09/24/2022   NA 138 09/24/2022   K 3.8 09/24/2022   CL 105 09/24/2022   CREATININE 1.64 (H) 09/24/2022   BUN 29  (H) 09/24/2022   CO2 24 09/24/2022   TSH 4.42 09/24/2022   PSA 3.59 05/08/2017   INR 1.0 08/22/2008   HGBA1C 6.3 09/24/2022   MICROALBUR 1.8 09/24/2022   Assessment/Plan:  Nathan Russo is a 82 y.o. White or Caucasian [1] male with  has a past medical history of Abdominal pain, epigastric (04/11/2010), BELCHING (04/23/2010), BRADYCARDIA, CHRONIC (10/24/2006), CARPAL TUNNEL SYNDROME, BILATERAL (02/07/2009), CHEST PAIN-UNSPECIFIED (09/05/2008), CHOLELITHIASIS (04/22/2010), Cholelithiasis (06/13/2010), COLONIC POLYPS, HX OF (04/28/2007), DEGENERATIVE JOINT DISEASE, RIGHT KNEE (10/24/2006), Depression (02/23/2011), DIABETES MELLITUS, TYPE II (04/28/2007), Dizziness and giddiness (12/19/2009), Elevated PSA (06/13/2010), FATIGUE (04/28/2007), GERD (02/11/2009), Headache(784.0) (12/19/2009), HYPERLIPIDEMIA (04/28/2007), HYPERTENSION (10/21/2006), NECK MASS (02/07/2010), OBESITY (10/24/2006), OTITIS MEDIA, ACUTE, LEFT (12/26/2009), PHIMOSIS (02/07/2009), S/P laparoscopic cholecystectomy (10/31/2010), Thyroid cancer (HCC) (06/13/2010), and THYROID NODULE (02/07/2010).  Depression Overall stable, declines need  for change in tx or referral for counseling  Diabetes mellitus (HCC) Lab Results  Component Value Date   HGBA1C 6.3 09/24/2022   Stable, pt to continue current medical treatment metfomrin ER 500 mg - 1 bid   Essential hypertension BP Readings from Last 3 Encounters:  09/24/22 (!) 140/68  06/16/22 114/62  06/02/22 124/74   unocontrolled, pt states controlled at home,, pt to continue medical treatment norvasc 2.5 every day, losartan 100 mg every day, toprl xl 25 every day as declines change   Hyperlipidemia Lab Results  Component Value Date   LDLCALC 75 09/24/2022   Uncontrolled, goal ldl < 70,, pt to continue current zetia 10 every day, declines add repatha or statin     Postoperative hypothyroidism Lab Results  Component Value Date   TSH 4.42 09/24/2022   Stable, pt to continue levothyroxine 112 mcg  qd  Followup: Return in about 6 months (around 03/27/2023).  Oliver Barre, MD 09/27/2022 4:52 PM [facility] - [facility]

## 2022-09-24 NOTE — Patient Instructions (Addendum)
Please have your Shingrix (shingles) shots done at your local pharmacy.  Please continue all other medications as before, and refills have been done if requested - the Guide Me strips  Please have the pharmacy call with any other refills you may need.  Please continue your efforts at being more active, low cholesterol diet, and weight control.  You are otherwise up to date with prevention measures today.  Please keep your appointments with your specialists as you may have planned  Please go to the LAB at the blood drawing area for the tests to be done  You will be contacted by phone if any changes need to be made immediately.  Otherwise, you will receive a letter about your results with an explanation, but please check with MyChart first.  Please remember to sign up for MyChart if you have not done so, as this will be important to you in the future with finding out test results, communicating by private email, and scheduling acute appointments online when needed.  Please make an Appointment to return in 6 months, or sooner if needed

## 2022-09-24 NOTE — Progress Notes (Signed)
The test results show that your current treatment is OK, as the tests are stable.  Please continue the same plan.  There is no other need for change of treatment or further evaluation based on these results, at this time.  thanks 

## 2022-09-27 ENCOUNTER — Encounter: Payer: Self-pay | Admitting: Internal Medicine

## 2022-09-27 NOTE — Assessment & Plan Note (Signed)
Lab Results  Component Value Date   HGBA1C 6.3 09/24/2022   Stable, pt to continue current medical treatment metfomrin ER 500 mg - 1 bid

## 2022-09-27 NOTE — Assessment & Plan Note (Signed)
Lab Results  Component Value Date   LDLCALC 75 09/24/2022   Uncontrolled, goal ldl < 70,, pt to continue current zetia 10 every day, declines add repatha or statin

## 2022-09-27 NOTE — Assessment & Plan Note (Signed)
Overall stable, declines need for change in tx or referral for counseling

## 2022-09-27 NOTE — Assessment & Plan Note (Signed)
Lab Results  Component Value Date   TSH 4.42 09/24/2022   Stable, pt to continue levothyroxine 112 mcg qd

## 2022-09-27 NOTE — Assessment & Plan Note (Signed)
BP Readings from Last 3 Encounters:  09/24/22 (!) 140/68  06/16/22 114/62  06/02/22 124/74   unocontrolled, pt states controlled at home,, pt to continue medical treatment norvasc 2.5 every day, losartan 100 mg every day, toprl xl 25 every day as declines change

## 2022-09-29 ENCOUNTER — Other Ambulatory Visit: Payer: Self-pay

## 2022-09-29 MED ORDER — ACCU-CHEK GUIDE VI STRP: ORAL_STRIP | 12 refills | Status: DC

## 2022-10-01 ENCOUNTER — Telehealth: Payer: Self-pay | Admitting: Internal Medicine

## 2022-10-01 NOTE — Telephone Encounter (Signed)
Tiffany a nurse with [facility] called to inform she was able to get the patient the accu-check test strips, the authorization number is [id number].

## 2022-10-02 NOTE — Telephone Encounter (Signed)
Ok noted.   I dont see a request or question.   thanks

## 2022-10-05 DIAGNOSIS — R509 Fever, unspecified: Secondary | ICD-10-CM | POA: Diagnosis not present

## 2022-10-05 DIAGNOSIS — R6882 Decreased libido: Secondary | ICD-10-CM | POA: Diagnosis not present

## 2022-10-06 DIAGNOSIS — K5730 Diverticulosis of large intestine without perforation or abscess without bleeding: Secondary | ICD-10-CM | POA: Diagnosis not present

## 2022-10-06 DIAGNOSIS — A419 Sepsis, unspecified organism: Secondary | ICD-10-CM | POA: Diagnosis not present

## 2022-10-06 DIAGNOSIS — I440 Atrioventricular block, first degree: Secondary | ICD-10-CM | POA: Diagnosis not present

## 2022-10-06 DIAGNOSIS — I959 Hypotension, unspecified: Secondary | ICD-10-CM | POA: Diagnosis not present

## 2022-10-06 DIAGNOSIS — Z794 Long term (current) use of insulin: Secondary | ICD-10-CM | POA: Diagnosis not present

## 2022-10-06 DIAGNOSIS — R059 Cough, unspecified: Secondary | ICD-10-CM | POA: Diagnosis not present

## 2022-10-06 DIAGNOSIS — J189 Pneumonia, unspecified organism: Secondary | ICD-10-CM | POA: Diagnosis not present

## 2022-10-06 DIAGNOSIS — Z955 Presence of coronary angioplasty implant and graft: Secondary | ICD-10-CM | POA: Diagnosis not present

## 2022-10-06 DIAGNOSIS — I10 Essential (primary) hypertension: Secondary | ICD-10-CM | POA: Diagnosis not present

## 2022-10-06 DIAGNOSIS — N390 Urinary tract infection, site not specified: Secondary | ICD-10-CM | POA: Diagnosis not present

## 2022-10-06 DIAGNOSIS — R509 Fever, unspecified: Secondary | ICD-10-CM | POA: Diagnosis not present

## 2022-10-06 DIAGNOSIS — I25118 Atherosclerotic heart disease of native coronary artery with other forms of angina pectoris: Secondary | ICD-10-CM | POA: Diagnosis not present

## 2022-10-06 DIAGNOSIS — J159 Unspecified bacterial pneumonia: Secondary | ICD-10-CM | POA: Diagnosis not present

## 2022-10-06 DIAGNOSIS — K219 Gastro-esophageal reflux disease without esophagitis: Secondary | ICD-10-CM | POA: Diagnosis not present

## 2022-10-06 DIAGNOSIS — I700 Atherosclerosis of aorta: Secondary | ICD-10-CM | POA: Diagnosis not present

## 2022-10-06 DIAGNOSIS — E876 Hypokalemia: Secondary | ICD-10-CM | POA: Diagnosis not present

## 2022-10-06 DIAGNOSIS — N400 Enlarged prostate without lower urinary tract symptoms: Secondary | ICD-10-CM | POA: Diagnosis not present

## 2022-10-06 DIAGNOSIS — Z7901 Long term (current) use of anticoagulants: Secondary | ICD-10-CM | POA: Diagnosis not present

## 2022-10-06 DIAGNOSIS — N1330 Unspecified hydronephrosis: Secondary | ICD-10-CM | POA: Diagnosis not present

## 2022-10-06 DIAGNOSIS — Z8674 Personal history of sudden cardiac arrest: Secondary | ICD-10-CM | POA: Diagnosis not present

## 2022-10-06 DIAGNOSIS — Z87891 Personal history of nicotine dependence: Secondary | ICD-10-CM | POA: Diagnosis not present

## 2022-10-06 DIAGNOSIS — R918 Other nonspecific abnormal finding of lung field: Secondary | ICD-10-CM | POA: Diagnosis not present

## 2022-10-06 DIAGNOSIS — I499 Cardiac arrhythmia, unspecified: Secondary | ICD-10-CM | POA: Diagnosis not present

## 2022-10-06 DIAGNOSIS — Z79899 Other long term (current) drug therapy: Secondary | ICD-10-CM | POA: Diagnosis not present

## 2022-10-06 DIAGNOSIS — I480 Paroxysmal atrial fibrillation: Secondary | ICD-10-CM | POA: Diagnosis not present

## 2022-10-06 DIAGNOSIS — Z7902 Long term (current) use of antithrombotics/antiplatelets: Secondary | ICD-10-CM | POA: Diagnosis not present

## 2022-10-06 DIAGNOSIS — R001 Bradycardia, unspecified: Secondary | ICD-10-CM | POA: Diagnosis not present

## 2022-10-06 DIAGNOSIS — J9601 Acute respiratory failure with hypoxia: Secondary | ICD-10-CM | POA: Diagnosis not present

## 2022-10-06 DIAGNOSIS — I252 Old myocardial infarction: Secondary | ICD-10-CM | POA: Diagnosis not present

## 2022-10-06 DIAGNOSIS — I4430 Unspecified atrioventricular block: Secondary | ICD-10-CM | POA: Diagnosis not present

## 2022-10-14 ENCOUNTER — Telehealth: Payer: Self-pay | Admitting: *Deleted

## 2022-10-14 ENCOUNTER — Encounter: Payer: Self-pay | Admitting: *Deleted

## 2022-10-14 NOTE — Transitions of Care (Post Inpatient/ED Visit) (Signed)
   10/14/2022  Name: Nathan Russo MRN: [id number] DOB: 24-Sep-1940  Today's TOC FU Call Status: Today's TOC FU Call Status:: Unsuccessful Call (1st Attempt) Unsuccessful Call (1st Attempt) Date: 10/14/22  Attempted to reach the patient regarding the most recent Inpatient visit; left HIPAA compliant voice message requesting call back  Follow Up Plan: Additional outreach attempts will be made to reach the patient to complete the Transitions of Care (Post Inpatient visit) call.   Caryl Pina, RN, BSN, CCRN Alumnus RN CM Care Coordination/ Transition of Care- [facility] [phone]: direct office

## 2022-10-14 NOTE — Transitions of Care (Post Inpatient/ED Visit) (Signed)
10/14/2022  Name: IDIRIS CAPANO MRN: [id number] DOB: [birth year]  Today's TOC FU Call Status: Today's TOC FU Call Status:: Successful TOC FU Call Completed TOC FU Call Complete Date: 10/14/22  Transition Care Management Follow-up Telephone Call Date of Discharge: 10/11/22 Discharge Facility: Other ([facility]) Name of Other ([facility]) Discharge Facility: [facility] Type of Discharge: Inpatient Admission Primary Inpatient Discharge Diagnosis:: fever, pneumonia; UTI How have you been since you were released from the hospital?: Better ("I think I am better, but I am weak and washed out.  I'm not sure exactly about the discharge insructions they gave me at [facility]-- I am going to go over all of my medicine with Dr. Jonny Ruiz when I see him-- I have way too many medicines to do that today") Any questions or concerns?: No  Items Reviewed: Did you receive and understand the discharge instructions provided?: Yes (briefly reviewed with patient who verbalizes good understanding of same - outside hospital AVS) Medications obtained,verified, and reconciled?: No Medications Not Reviewed Reasons:: Other: (patient declined- states has too many to review by phone and he is too weak and tired to do the review; reports self manages medicines and denies concerns around medications) Any new allergies since your discharge?: No Dietary orders reviewed?: Yes Type of Diet Ordered:: "Healthy" Do you have support at home?: Yes People in Home: spouse Name of Support/Comfort Primary Source: Reports independent in self-care activities; resides with supportive spouse assists as/ if needed/ indicated; local daughter assists as indicated  Medications Reviewed Today: Medications Reviewed Today     Reviewed by Michaela Corner, RN (Registered Nurse) on 10/14/22 at 1019  Med List Status: <None>   Medication Order Taking? Sig Documenting Provider Last Dose Status Informant  amiodarone (PACERONE) 200 MG tablet [id number] No  Take by mouth. [provider] Taking Active            Med Note Michaela Corner   Tue Oct 14, 2022 10:19 AM) 10/14/22: Declines medication review during TOC call  amLODipine (NORVASC) 2.5 MG tablet [id number] No Take 1 tablet (2.5 mg total) by mouth daily. Corwin Levins, MD Taking Active   apixaban [facility]) 2.5 MG TABS tablet [id number] No Take 1 tablet (2.5 mg total) by mouth 2 (two) times daily.  Patient taking differently: Take 5 mg by mouth 2 (two) times daily.   Corwin Levins, MD Taking Active   Ascorbic Acid (VITAMIN C) 1000 MG tablet [id number] No Take 1,000 mg by mouth daily. [provider] Taking Active   augmented betamethasone dipropionate (DIPROLENE-AF) 0.05 % ointment [id number] No betamethasone, augmented 0.05 % topical ointment  APPLY OINTMENT TOPICALLY TO EACH EAR AS NEEDED FOR ITCHING [provider] Taking Active   Blood Glucose Monitoring Suppl (ONETOUCH VERIO FLEX SYSTEM) w/Device KIT [id number] No Use as directed three times per day E11.9 Corwin Levins, MD Taking Active   Calcium Carb-Cholecalciferol [phone] MG-UNIT CAPS [id number] No Take by mouth. [provider] Taking Active   ciclopirox (PENLAC) 8 % solution [id number] No  [provider] Taking Active   Cinnamon 500 MG capsule [id number] No Take 500 mg by mouth 2 (two) times daily. [provider] Taking Active   cyanocobalamin 1000 MCG tablet [id number] No Take by mouth. [provider] Taking Active   Diclofenac Sodium (PENNSAID) 2 % SOLN [id number] No Pennsaid 20 mg/gram/actuation (2 %) topical soln in metered-dose pump  APPLY 2 PUMPS (40 MG) TO THE AFFECTED KNEE BY TOPICAL ROUTE 2 TIMES PER DAY  [provider] Taking Active   ezetimibe (ZETIA) 10 MG tablet [id number] No Take 1 tablet (10 mg total) by mouth daily. Corwin Levins, MD Taking Active   Flaxseed, Linseed, 1000 MG CAPS [id number] No Take 1 capsule by mouth daily. [provider] Taking Active    Garlic Oil 500 MG TABS [id number] No Take by mouth 2 (two) times daily. [provider] Taking Active   Ginger, Zingiber officinalis, (GINGER PO) [id number] No Take by mouth 4 (four) times daily. [provider] Taking Active   Glucosamine-Chondroit-Vit C-Mn (GLUCOSAMINE CHONDROITIN COMPLX) CAPS [id number] No Take by mouth daily. [provider] Taking Active   Glucosamine-Chondroitin 250-200 MG TABS [id number] No Take 1 tablet by mouth at bedtime. [provider] Taking Active   glucose blood (ACCU-CHEK GUIDE) test strip [id number]  Use as instructed four time per day E11.9 Corwin Levins, MD  Active   hydrocortisone 2.5 % ointment [id number] No Apply topically 2 (two) times daily. [provider] Taking Active   isosorbide mononitrate (IMDUR) 60 MG 24 hr tablet [id number] No Take by mouth. [provider] Taking Active   ketoconazole (NIZORAL) 2 % cream [id number] No ketoconazole 2 % topical cream  APPLY CREAM TOPICALLY ONCE DAILY FOR 30 DAYS [provider] Taking Active   Lancets MISC [id number] No Use as directed three times per day E11.9 Corwin Levins, MD Taking Active   levothyroxine (SYNTHROID) 112 MCG tablet [id number] No Take 1 tablet (112 mcg total) by mouth daily. Corwin Levins, MD Taking Active   losartan (COZAAR) 100 MG tablet [id number] No Take 1 tablet (100 mg total) by mouth daily. Corwin Levins, MD Taking Active   metFORMIN (GLUCOPHAGE-XR) 500 MG 24 hr tablet [id number] No Take 1 tablet by mouth twice daily Corwin Levins, MD Taking Active   metoprolol succinate (TOPROL-XL) 25 MG 24 hr tablet [id number] No  [provider] Taking Active   Multiple Vitamin (MULTIVITAMIN) capsule [id number] No Take 1 capsule by mouth daily. [provider] Taking Active   nitroGLYCERIN (NITROSTAT) 0.4 MG SL tablet [id number] No  [provider] Taking Active   Omega-3 Fatty Acids (FISH OIL) 1000 MG CAPS [id number] No Take by mouth 2  (two) times daily. [provider] Taking Active   pantoprazole (PROTONIX) 40 MG tablet [id number] No Take 2 tablets by mouth once daily Corwin Levins, MD Taking Active   prasugrel (EFFIENT) 10 MG TABS tablet [id number] No Take 10 mg by mouth daily. [provider] Taking Active   triamcinolone (KENALOG) 0.025 % cream [id number] No Apply topically. [provider] Taking Active   VITAMIN D, CHOLECALCIFEROL, PO [id number] No Take by mouth daily. [provider] Taking Active            Home Care and Equipment/Supplies: Were Home Health Services Ordered?: No Any new equipment or medical supplies ordered?: No  Functional Questionnaire: Do you need assistance with bathing/showering or dressing?: No Do you need assistance with meal preparation?: No Do you need assistance with eating?: No Do you have difficulty maintaining continence: No Do you need assistance with getting out of bed/getting out of a chair/moving?: No Do you have difficulty managing or taking your medications?: No  Follow up appointments reviewed: PCP Follow-up appointment confirmed?: Yes (care coordination outreach in real-time with scheduling care guide to successfully schedule hospital follow up PCP appointment 10/17/22) Date of PCP follow-up appointment?: 10/17/22 Follow-up Provider: PCP Specialist Hospital Follow-up appointment confirmed?: NA Do you  need transportation to your follow-up appointment?: No Do you understand care options if your condition(s) worsen?: Yes-patient verbalized understanding  SDOH Interventions Today    Flowsheet Row Most Recent Value  SDOH Interventions   Food Insecurity Interventions Intervention Not Indicated  Transportation Interventions Intervention Not Indicated  [drives self,  family assists as/if needed]      TOC Interventions Today    Flowsheet Row Most Recent Value  TOC Interventions   TOC Interventions Discussed/Reviewed TOC Interventions  Discussed, Arranged PCP follow up within 7 days/Care Guide scheduled, Contacted provider for patient needs  [Patient declines need for ongoing/ further care coordination outreach,  no care coordination needs identified at time of TOC call today]      Interventions Today    Flowsheet Row Most Recent Value  Chronic Disease   Chronic disease during today's visit Other  [pneumonia/ UTI]  General Interventions   General Interventions Discussed/Reviewed General Interventions Discussed, Doctor Visits, Durable Medical Equipment (DME), Communication with  Doctor Visits Discussed/Reviewed PCP, Doctor Visits Discussed  Durable Medical Equipment (DME) Other  [confirmed currently requiring/ using assistive devices - cane]  PCP/Specialist Visits Compliance with follow-up visit  Communication with PCP/Specialists  Education Interventions   Education Provided Provided Education  Provided Verbal Education On Other  [purpose of/ importance of having updated DPR completed]  Nutrition Interventions   Nutrition Discussed/Reviewed Nutrition Discussed  Pharmacy Interventions   Pharmacy Dicussed/Reviewed Pharmacy Topics Discussed  Safety Interventions   Safety Discussed/Reviewed Safety Discussed      Caryl Pina, RN, BSN, CCRN Alumnus RN CM Care Coordination/ Transition of Care- [facility] Care Management [phone]: direct office

## 2022-10-17 ENCOUNTER — Ambulatory Visit (INDEPENDENT_AMBULATORY_CARE_PROVIDER_SITE_OTHER): Payer: Medicare (Managed Care) | Admitting: Internal Medicine

## 2022-10-17 VITALS — BP 132/68 | HR 58 | Temp 98.0°F | Ht 72.0 in | Wt 211.2 lb

## 2022-10-17 DIAGNOSIS — Z7984 Long term (current) use of oral hypoglycemic drugs: Secondary | ICD-10-CM

## 2022-10-17 DIAGNOSIS — I4891 Unspecified atrial fibrillation: Secondary | ICD-10-CM

## 2022-10-17 DIAGNOSIS — J189 Pneumonia, unspecified organism: Secondary | ICD-10-CM

## 2022-10-17 DIAGNOSIS — E1140 Type 2 diabetes mellitus with diabetic neuropathy, unspecified: Secondary | ICD-10-CM

## 2022-10-17 DIAGNOSIS — Z8701 Personal history of pneumonia (recurrent): Secondary | ICD-10-CM

## 2022-10-17 DIAGNOSIS — E119 Type 2 diabetes mellitus without complications: Secondary | ICD-10-CM | POA: Diagnosis not present

## 2022-10-17 DIAGNOSIS — R269 Unspecified abnormalities of gait and mobility: Secondary | ICD-10-CM

## 2022-10-17 DIAGNOSIS — I4892 Unspecified atrial flutter: Secondary | ICD-10-CM | POA: Diagnosis not present

## 2022-10-17 NOTE — Patient Instructions (Signed)
Please continue all other medications as before, and refills have been done if requested.  Please have the pharmacy call with any other refills you may need.  Please continue your efforts at being more active, low cholesterol diet, and weight control.  You are otherwise up to date with prevention measures today.  Please keep your appointments with your specialists as you may have planned  You will be contacted regarding the referral for: Home Health with Physical Therapy  Please make an Appointment to return in Jan 20, or sooner if needed

## 2022-10-17 NOTE — Progress Notes (Unsigned)
Patient ID: Nathan Russo, male   DOB: [birth year], 82 y.o.   MRN: [id number]        Chief Complaint: follow up hosp f/u after d/c aug 1 - aug 6       HPI:  Nathan Russo is a 82 y.o. male here with f/u left CAP after admitted with temp 104.9 per pt but curiously no cough, sob, pain.  Did well with IV antibiotics now finished home tx doxycycline.  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  Pt denies chest pain, increased sob or doe, wheezing, orthopnea, PND, increased LE swelling, palpitations, dizziness or syncope.   Pt denies polydipsia, polyuria, or new focal neuro s/s.         Transitional Care Management elements noted today: 1)  Date of D/C: as above 2)  Medication reconciliation:  done today at end visit 3)  Review of D/C summary or other information:  done today 4)  Review of need for f/u on pending diagnostic tests and treatments:  done today - none 5)  Review of need for Interaction with other providers who will assume or resume care of pt specific problems: done today - none 6)  Education of patient/family/guardian or caregiver: none needed  Wt Readings from Last 3 Encounters:  10/17/22 211 lb 3.2 oz (95.8 kg)  09/24/22 209 lb (94.8 kg)  06/16/22 212 lb (96.2 kg)   BP Readings from Last 3 Encounters:  10/17/22 132/68  09/24/22 (!) 140/68  06/16/22 114/62         Past Medical History:  Diagnosis Date   Abdominal pain, epigastric 04/11/2010   BELCHING 04/23/2010   BRADYCARDIA, CHRONIC 10/24/2006   CARPAL TUNNEL SYNDROME, BILATERAL 02/07/2009   CHEST PAIN-UNSPECIFIED 09/05/2008   CHOLELITHIASIS 04/22/2010   Cholelithiasis 06/13/2010   COLONIC POLYPS, HX OF 04/28/2007   DEGENERATIVE JOINT DISEASE, RIGHT KNEE 10/24/2006   Depression 02/23/2011   DIABETES MELLITUS,  TYPE II 04/28/2007   Dizziness and giddiness 12/19/2009   Elevated PSA 06/13/2010   FATIGUE 04/28/2007   GERD 02/11/2009   Headache(784.0) 12/19/2009   HYPERLIPIDEMIA 04/28/2007   HYPERTENSION 10/21/2006   NECK MASS 02/07/2010   OBESITY 10/24/2006   OTITIS MEDIA, ACUTE, LEFT 12/26/2009   PHIMOSIS 02/07/2009   S/P laparoscopic cholecystectomy 10/31/2010   Thyroid cancer (HCC) 06/13/2010   THYROID NODULE 02/07/2010   Past Surgical History:  Procedure Laterality Date   CHOLECYSTECTOMY     left knee surgery     right wrist surgury     THYROID SURGERY      reports that he has quit smoking. He has never used smokeless tobacco. He reports that he does not drink alcohol and does not use drugs. family history includes Arthritis in his mother; Cancer in his brother and sister; Colon cancer (age of onset: 7) in his brother; Diabetes in his brother; Goiter in his mother; Heart attack (age of onset: 42) in his brother; Heart attack (age of onset: 67) in his father; Hypertension in his mother; Hypothyroidism in his sister. Allergies  Allergen Reactions   Diphenoxylate-Atropine Other (See Comments)   Other Other (See Comments) and Hives    Causes body to ache Causes body to ache   Sulfa Antibiotics Other (See Comments)  As child almost died   Ace Inhibitors     REACTION: cough   Atenolol     REACTION: bradycardia   Codeine Other (See Comments)    Head spins "wild"   Levaquin [Levofloxacin]     Interferes with flecainide   Lovastatin     REACTION: myalygros   Morphine Nausea And Vomiting   Morphine And Codeine Other (See Comments)   Nsaids     Heart patient   Statins Other (See Comments)    Causes body to ache   Sulfamethoxazole Other (See Comments)    Childhood unknown reaction   Sulfasalazine Other (See Comments)    As child almost died   Tramadol     Ants crawling all over   Current Outpatient Medications on File Prior to Visit  Medication Sig Dispense Refill   amiodarone  (PACERONE) 200 MG tablet Take by mouth.     amLODipine (NORVASC) 2.5 MG tablet Take 1 tablet (2.5 mg total) by mouth daily. 90 tablet 3   apixaban (ELIQUIS) 2.5 MG TABS tablet Take 1 tablet (2.5 mg total) by mouth 2 (two) times daily. (Patient taking differently: Take 5 mg by mouth 2 (two) times daily.) 180 tablet 3   Ascorbic Acid (VITAMIN C) 1000 MG tablet Take 1,000 mg by mouth daily.     augmented betamethasone dipropionate (DIPROLENE-AF) 0.05 % ointment betamethasone, augmented 0.05 % topical ointment  APPLY OINTMENT TOPICALLY TO EACH EAR AS NEEDED FOR ITCHING     Blood Glucose Monitoring Suppl (ONETOUCH VERIO FLEX SYSTEM) w/Device KIT Use as directed three times per day E11.9 1 kit 0   Calcium Carb-Cholecalciferol [phone] MG-UNIT CAPS Take by mouth.     ciclopirox (PENLAC) 8 % solution      Cinnamon 500 MG capsule Take 500 mg by mouth 2 (two) times daily.     cyanocobalamin 1000 MCG tablet Take by mouth.     Diclofenac Sodium (PENNSAID) 2 % SOLN Pennsaid 20 mg/gram/actuation (2 %) topical soln in metered-dose pump  APPLY 2 PUMPS (40 MG) TO THE AFFECTED KNEE BY TOPICAL ROUTE 2 TIMES PER DAY     ezetimibe (ZETIA) 10 MG tablet Take 1 tablet (10 mg total) by mouth daily. 90 tablet 3   Flaxseed, Linseed, 1000 MG CAPS Take 1 capsule by mouth daily.     Garlic Oil 500 MG TABS Take by mouth 2 (two) times daily.     Ginger, Zingiber officinalis, (GINGER PO) Take by mouth 4 (four) times daily.     Glucosamine-Chondroit-Vit C-Mn (GLUCOSAMINE CHONDROITIN COMPLX) CAPS Take by mouth daily.     Glucosamine-Chondroitin 250-200 MG TABS Take 1 tablet by mouth at bedtime.     glucose blood (ACCU-CHEK GUIDE) test strip Use as instructed four time per day E11.9 400 each 12   hydrocortisone 2.5 % ointment Apply topically 2 (two) times daily.     isosorbide mononitrate (IMDUR) 60 MG 24 hr tablet Take by mouth.     ketoconazole (NIZORAL) 2 % cream ketoconazole 2 % topical cream  APPLY CREAM TOPICALLY ONCE  DAILY FOR 30 DAYS     Lancets MISC Use as directed three times per day E11.9 300 each 3   levothyroxine (SYNTHROID) 112 MCG tablet Take 1 tablet (112 mcg total) by mouth daily. 90 tablet 3   losartan (COZAAR) 100 MG tablet Take 1 tablet (100 mg total) by mouth daily. 90 tablet 3   metFORMIN (GLUCOPHAGE-XR) 500 MG 24 hr tablet Take 1 tablet by mouth twice daily  180 tablet 3   metoprolol succinate (TOPROL-XL) 25 MG 24 hr tablet      Multiple Vitamin (MULTIVITAMIN) capsule Take 1 capsule by mouth daily.     nitroGLYCERIN (NITROSTAT) 0.4 MG SL tablet      Omega-3 Fatty Acids (FISH OIL) 1000 MG CAPS Take by mouth 2 (two) times daily.     pantoprazole (PROTONIX) 40 MG tablet Take 2 tablets by mouth once daily 180 tablet 3   prasugrel (EFFIENT) 10 MG TABS tablet Take 10 mg by mouth daily.     triamcinolone (KENALOG) 0.025 % cream Apply topically.     VITAMIN D, CHOLECALCIFEROL, PO Take by mouth daily.     No current facility-administered medications on file prior to visit.        ROS:  All others reviewed and negative.  Objective        PE:  BP 132/68 (BP Location: Left Arm, Patient Position: Sitting, Cuff Size: Normal)   Pulse (!) 58   Temp 98 F (36.7 C) (Oral)   Ht 6' (1.829 m)   Wt 211 lb 3.2 oz (95.8 kg)   SpO2 95%   BMI 28.64 kg/m                 Constitutional: Pt appears in NAD               HENT: Head: NCAT.                Right Ear: External ear normal.                 Left Ear: External ear normal.                Eyes: . Pupils are equal, round, and reactive to light. Conjunctivae and EOM are normal               Nose: without d/c or deformity               Neck: Neck supple. Gross normal ROM               Cardiovascular: Normal rate and regular rhythm.                 Pulmonary/Chest: Effort normal and breath sounds without rales or wheezing.                Abd:  Soft, NT, ND, + BS, no organomegaly               Neurological: Pt is alert. At baseline orientation, motor  grossly intact               Skin: Skin is warm. No rashes, no other new lesions, LE edema - none               Psychiatric: Pt behavior is normal without agitation   Micro: none  Cardiac tracings I have personally interpreted today:  none  Pertinent Radiological findings (summarize): none   Lab Results  Component Value Date   WBC 5.8 09/24/2022   HGB 11.4 (L) 09/24/2022   HCT 35.0 (L) 09/24/2022   PLT 286.0 09/24/2022   GLUCOSE 117 (H) 09/24/2022   CHOL 136 09/24/2022   TRIG 101.0 09/24/2022   HDL 40.80 09/24/2022   LDLDIRECT 80.0 03/18/2022   LDLCALC 75 09/24/2022   ALT 14 09/24/2022   AST 19 09/24/2022   NA 138 09/24/2022   K 3.8 09/24/2022   CL 105 09/24/2022  CREATININE 1.64 (H) 09/24/2022   BUN 29 (H) 09/24/2022   CO2 24 09/24/2022   TSH 4.42 09/24/2022   PSA 3.59 05/08/2017   INR 1.0 08/22/2008   HGBA1C 6.3 09/24/2022   MICROALBUR 1.8 09/24/2022   Assessment/Plan:  Nathan Russo is a 82 y.o. White or Caucasian [1] male with  has a past medical history of Abdominal pain, epigastric (04/11/2010), BELCHING (04/23/2010), BRADYCARDIA, CHRONIC (10/24/2006), CARPAL TUNNEL SYNDROME, BILATERAL (02/07/2009), CHEST PAIN-UNSPECIFIED (09/05/2008), CHOLELITHIASIS (04/22/2010), Cholelithiasis (06/13/2010), COLONIC POLYPS, HX OF (04/28/2007), DEGENERATIVE JOINT DISEASE, RIGHT KNEE (10/24/2006), Depression (02/23/2011), DIABETES MELLITUS, TYPE II (04/28/2007), Dizziness and giddiness (12/19/2009), Elevated PSA (06/13/2010), FATIGUE (04/28/2007), GERD (02/11/2009), Headache(784.0) (12/19/2009), HYPERLIPIDEMIA (04/28/2007), HYPERTENSION (10/21/2006), NECK MASS (02/07/2010), OBESITY (10/24/2006), OTITIS MEDIA, ACUTE, LEFT (12/26/2009), PHIMOSIS (02/07/2009), S/P laparoscopic cholecystectomy (10/31/2010), Thyroid cancer (HCC) (06/13/2010), and THYROID NODULE (02/07/2010).  Atrial fibrillation and flutter (HCC) Stable, continu current med tx  Diabetes mellitus (HCC) Lab Results  Component Value Date   HGBA1C  6.3 09/24/2022   Stable, pt to continue current medical treatment metfomrin ER 500 mg - 1 qd   Community acquired pneumonia Clinicaly resolved, declines f/up cxr for now  Gait disorder Also for [facility] with PT  Followup: Return in about 5 months (around 03/30/2023).  Oliver Barre, MD 10/18/2022 9:04 PM [facility]

## 2022-10-18 ENCOUNTER — Encounter: Payer: Self-pay | Admitting: Internal Medicine

## 2022-10-18 DIAGNOSIS — R269 Unspecified abnormalities of gait and mobility: Secondary | ICD-10-CM | POA: Insufficient documentation

## 2022-10-18 NOTE — Assessment & Plan Note (Signed)
Clinicaly resolved, declines f/up cxr for now

## 2022-10-18 NOTE — Assessment & Plan Note (Signed)
Lab Results  Component Value Date   HGBA1C 6.3 09/24/2022   Stable, pt to continue current medical treatment metfomrin ER 500 mg - 1 qd

## 2022-10-18 NOTE — Assessment & Plan Note (Signed)
Stable, continu current med tx

## 2022-10-18 NOTE — Assessment & Plan Note (Signed)
Also for HH with PT 

## 2022-10-21 DIAGNOSIS — R1084 Generalized abdominal pain: Secondary | ICD-10-CM | POA: Diagnosis not present

## 2022-10-21 DIAGNOSIS — R300 Dysuria: Secondary | ICD-10-CM | POA: Diagnosis not present

## 2022-10-21 DIAGNOSIS — R8281 Pyuria: Secondary | ICD-10-CM | POA: Diagnosis not present

## 2022-10-21 DIAGNOSIS — R350 Frequency of micturition: Secondary | ICD-10-CM | POA: Diagnosis not present

## 2022-10-27 DIAGNOSIS — I440 Atrioventricular block, first degree: Secondary | ICD-10-CM | POA: Diagnosis not present

## 2022-10-27 DIAGNOSIS — Z9989 Dependence on other enabling machines and devices: Secondary | ICD-10-CM | POA: Diagnosis not present

## 2022-10-27 DIAGNOSIS — I10 Essential (primary) hypertension: Secondary | ICD-10-CM | POA: Diagnosis not present

## 2022-10-27 DIAGNOSIS — Z955 Presence of coronary angioplasty implant and graft: Secondary | ICD-10-CM | POA: Diagnosis not present

## 2022-10-27 DIAGNOSIS — Z8701 Personal history of pneumonia (recurrent): Secondary | ICD-10-CM | POA: Diagnosis not present

## 2022-10-27 DIAGNOSIS — G4733 Obstructive sleep apnea (adult) (pediatric): Secondary | ICD-10-CM | POA: Diagnosis not present

## 2022-10-27 DIAGNOSIS — I2510 Atherosclerotic heart disease of native coronary artery without angina pectoris: Secondary | ICD-10-CM | POA: Diagnosis not present

## 2022-10-27 DIAGNOSIS — I4581 Long QT syndrome: Secondary | ICD-10-CM | POA: Diagnosis not present

## 2022-10-27 DIAGNOSIS — Z9889 Other specified postprocedural states: Secondary | ICD-10-CM | POA: Diagnosis not present

## 2022-10-27 DIAGNOSIS — I4891 Unspecified atrial fibrillation: Secondary | ICD-10-CM | POA: Diagnosis not present

## 2022-10-27 DIAGNOSIS — I444 Left anterior fascicular block: Secondary | ICD-10-CM | POA: Diagnosis not present

## 2022-10-27 DIAGNOSIS — Z87440 Personal history of urinary (tract) infections: Secondary | ICD-10-CM | POA: Diagnosis not present

## 2022-10-27 DIAGNOSIS — Z85850 Personal history of malignant neoplasm of thyroid: Secondary | ICD-10-CM | POA: Diagnosis not present

## 2022-10-27 DIAGNOSIS — I4719 Other supraventricular tachycardia: Secondary | ICD-10-CM | POA: Diagnosis not present

## 2022-10-30 ENCOUNTER — Encounter: Payer: Self-pay | Admitting: Internal Medicine

## 2022-10-30 ENCOUNTER — Ambulatory Visit (INDEPENDENT_AMBULATORY_CARE_PROVIDER_SITE_OTHER): Payer: Medicare (Managed Care)

## 2022-10-30 ENCOUNTER — Ambulatory Visit (INDEPENDENT_AMBULATORY_CARE_PROVIDER_SITE_OTHER): Payer: Medicare (Managed Care) | Admitting: Internal Medicine

## 2022-10-30 VITALS — BP 118/68 | HR 53 | Temp 98.5°F | Ht 72.0 in | Wt 212.0 lb

## 2022-10-30 DIAGNOSIS — R300 Dysuria: Secondary | ICD-10-CM

## 2022-10-30 DIAGNOSIS — L409 Psoriasis, unspecified: Secondary | ICD-10-CM | POA: Insufficient documentation

## 2022-10-30 DIAGNOSIS — Z85828 Personal history of other malignant neoplasm of skin: Secondary | ICD-10-CM | POA: Insufficient documentation

## 2022-10-30 DIAGNOSIS — D235 Other benign neoplasm of skin of trunk: Secondary | ICD-10-CM | POA: Insufficient documentation

## 2022-10-30 DIAGNOSIS — N184 Chronic kidney disease, stage 4 (severe): Secondary | ICD-10-CM | POA: Diagnosis not present

## 2022-10-30 DIAGNOSIS — Z7984 Long term (current) use of oral hypoglycemic drugs: Secondary | ICD-10-CM | POA: Diagnosis not present

## 2022-10-30 DIAGNOSIS — L821 Other seborrheic keratosis: Secondary | ICD-10-CM | POA: Insufficient documentation

## 2022-10-30 DIAGNOSIS — D224 Melanocytic nevi of scalp and neck: Secondary | ICD-10-CM | POA: Insufficient documentation

## 2022-10-30 DIAGNOSIS — I872 Venous insufficiency (chronic) (peripheral): Secondary | ICD-10-CM | POA: Insufficient documentation

## 2022-10-30 DIAGNOSIS — J189 Pneumonia, unspecified organism: Secondary | ICD-10-CM

## 2022-10-30 DIAGNOSIS — R0989 Other specified symptoms and signs involving the circulatory and respiratory systems: Secondary | ICD-10-CM | POA: Diagnosis not present

## 2022-10-30 DIAGNOSIS — E1140 Type 2 diabetes mellitus with diabetic neuropathy, unspecified: Secondary | ICD-10-CM

## 2022-10-30 DIAGNOSIS — I10 Essential (primary) hypertension: Secondary | ICD-10-CM

## 2022-10-30 DIAGNOSIS — D225 Melanocytic nevi of trunk: Secondary | ICD-10-CM | POA: Insufficient documentation

## 2022-10-30 DIAGNOSIS — R059 Cough, unspecified: Secondary | ICD-10-CM | POA: Diagnosis not present

## 2022-10-30 MED ORDER — CEFTRIAXONE SODIUM 1 G IJ SOLR
1.0000 g | Freq: Once | INTRAMUSCULAR | Status: AC
Start: 2022-10-30 — End: 2022-10-30
  Administered 2022-10-30: 1 g via INTRAMUSCULAR

## 2022-10-30 NOTE — Patient Instructions (Signed)
You had the antibiotic shot today (rocephin)  Please continue all other medications as before.  Please have the pharmacy call with any other refills you may need.  Please continue your efforts at being more active, low cholesterol diet, and weight control.  Please keep your appointments with your specialists as you may have planned  Please go to the XRAY Department in the first floor for the x-ray testing  Please go to the LAB at the blood drawing area for the tests to be done  You will be contacted by phone if any changes need to be made immediately.  Otherwise, you will receive a letter about your results with an explanation, but please check with MyChart first.  Please make an Appointment to return in 1 week, or sooner if needed

## 2022-10-31 LAB — LIPID PANEL
Cholesterol: 136 mg/dL (ref 0–200)
HDL: 40 mg/dL (ref >39.00)
LDL Cholesterol: 61 mg/dL (ref 0–99)
NonHDL: 95.95
Total CHOL/HDL Ratio: 3
Triglycerides: 173 mg/dL — ABNORMAL HIGH (ref 0.0–149.0)
VLDL: 34.6 mg/dL (ref 0.0–40.0)

## 2022-10-31 LAB — URINALYSIS, ROUTINE W REFLEX MICROSCOPIC
Bilirubin Urine: NEGATIVE
Hgb urine dipstick: NEGATIVE
Leukocytes,Ua: NEGATIVE
Nitrite: NEGATIVE
Specific Gravity, Urine: >=1.03 — AB (ref 1.000–1.030)
Total Protein, Urine: NEGATIVE
Urine Glucose: NEGATIVE
Urobilinogen, UA: 0.2 (ref 0.0–1.0)
pH: 5.5 (ref 5.0–8.0)

## 2022-10-31 LAB — CBC WITH DIFFERENTIAL/PLATELET
Basophils Absolute: 0.1 10*3/uL (ref 0.0–0.1)
Basophils Relative: 1.1 % (ref 0.0–3.0)
Eosinophils Absolute: 0.1 10*3/uL (ref 0.0–0.7)
Eosinophils Relative: 2.3 % (ref 0.0–5.0)
HCT: 33.2 % — ABNORMAL LOW (ref 39.0–52.0)
Hemoglobin: 10.8 g/dL — ABNORMAL LOW (ref 13.0–17.0)
Lymphocytes Relative: 25.5 % (ref 12.0–46.0)
Lymphs Abs: 1.5 10*3/uL (ref 0.7–4.0)
MCHC: 32.7 g/dL (ref 30.0–36.0)
MCV: 84.5 fl (ref 78.0–100.0)
Monocytes Absolute: 0.4 10*3/uL (ref 0.1–1.0)
Monocytes Relative: 6.8 % (ref 3.0–12.0)
Neutro Abs: 3.7 10*3/uL (ref 1.4–7.7)
Neutrophils Relative %: 64.3 % (ref 43.0–77.0)
Platelets: 382 10*3/uL (ref 150.0–400.0)
RBC: 3.93 Mil/uL — ABNORMAL LOW (ref 4.22–5.81)
RDW: 16.5 % — ABNORMAL HIGH (ref 11.5–15.5)
WBC: 5.8 10*3/uL (ref 4.0–10.5)

## 2022-10-31 LAB — HEPATIC FUNCTION PANEL
ALT: 18 U/L (ref 0–53)
AST: 23 U/L (ref 0–37)
Albumin: 3.7 g/dL (ref 3.5–5.2)
Alkaline Phosphatase: 62 U/L (ref 39–117)
Bilirubin, Direct: 0.2 mg/dL (ref 0.0–0.3)
Total Bilirubin: 0.6 mg/dL (ref 0.2–1.2)
Total Protein: 7 g/dL (ref 6.0–8.3)

## 2022-10-31 LAB — URINE CULTURE: Result:: NO GROWTH

## 2022-10-31 LAB — BASIC METABOLIC PANEL
BUN: 34 mg/dL — ABNORMAL HIGH (ref 6–23)
CO2: 24 mEq/L (ref 19–32)
Calcium: 9.3 mg/dL (ref 8.4–10.5)
Chloride: 108 mEq/L (ref 96–112)
Creatinine, Ser: 1.67 mg/dL — ABNORMAL HIGH (ref 0.40–1.50)
GFR: 37.41 mL/min — ABNORMAL LOW (ref >60.00)
Glucose, Bld: 140 mg/dL — ABNORMAL HIGH (ref 70–99)
Potassium: 4 meq/L (ref 3.5–5.1)
Sodium: 140 meq/L (ref 135–145)

## 2022-10-31 LAB — HEMOGLOBIN A1C: Hgb A1c MFr Bld: 6.4 % (ref 4.6–6.5)

## 2022-10-31 NOTE — Progress Notes (Signed)
The test results show that your current treatment is OK, as the tests are stable.  Please continue the same plan.  There is no other need for change of treatment or further evaluation based on these results, at this time.  thanks 

## 2022-11-01 DIAGNOSIS — R300 Dysuria: Secondary | ICD-10-CM | POA: Insufficient documentation

## 2022-11-01 NOTE — Assessment & Plan Note (Signed)
Mod to high suspision for uti  - for empiric rocephin 1 gm, and urine culture, for further antbx pending results

## 2022-11-01 NOTE — Assessment & Plan Note (Signed)
BP Readings from Last 3 Encounters:  10/30/22 118/68  10/17/22 132/68  09/24/22 (!) 140/68   Stable, pt to continue medical treatment norvasc 2.5 mg every day, losartan 100 qd

## 2022-11-01 NOTE — Assessment & Plan Note (Signed)
Lab Results  Component Value Date   HGBA1C 6.4 10/30/2022   Stable, pt to continue current medical treatment metfomrin ER 500 bid

## 2022-11-01 NOTE — Assessment & Plan Note (Signed)
Can't r/o recurrence - for cxr, consider further antibx pending results

## 2022-11-01 NOTE — Assessment & Plan Note (Signed)
Lab Results  Component Value Date   CREATININE 1.67 (H) 10/30/2022   Stable overall, cont to avoid nephrotoxins

## 2022-11-01 NOTE — Progress Notes (Signed)
Patient ID: Nathan Russo, male   DOB: [birth year], 82 y.o.   MRN: [id number]        Chief Complaint: follow up dysuria with fatigue, dm, ckd3a       HPI:  Nathan Russo is a 82 y.o. male here with c/o 2-3 days onset mild intermittent dysuria and unusual fatigue, moving slowly, without cough and Pt denies chest pain, increased sob or doe, wheezing, orthopnea, PND, increased LE swelling, palpitations, dizziness or syncope.  Denies urinary symptoms such as frequency, urgency, flank pain, hematuria or n/v, fever, chills.    Pt denies polydipsia, polyuria, or new focal neuro s/s.    Pt denies recent wt loss, night sweats, loss of appetite, or other constitutional symptoms         Wt Readings from Last 3 Encounters:  10/30/22 212 lb (96.2 kg)  10/17/22 211 lb 3.2 oz (95.8 kg)  09/24/22 209 lb (94.8 kg)   BP Readings from Last 3 Encounters:  10/30/22 118/68  10/17/22 132/68  09/24/22 (!) 140/68         Past Medical History:  Diagnosis Date   Abdominal pain, epigastric 04/11/2010   BELCHING 04/23/2010   BRADYCARDIA, CHRONIC 10/24/2006   CARPAL TUNNEL SYNDROME, BILATERAL 02/07/2009   CHEST PAIN-UNSPECIFIED 09/05/2008   CHOLELITHIASIS 04/22/2010   Cholelithiasis 06/13/2010   COLONIC POLYPS, HX OF 04/28/2007   DEGENERATIVE JOINT DISEASE, RIGHT KNEE 10/24/2006   Depression 02/23/2011   DIABETES MELLITUS, TYPE II 04/28/2007   Dizziness and giddiness 12/19/2009   Elevated PSA 06/13/2010   FATIGUE 04/28/2007   GERD 02/11/2009   Headache(784.0) 12/19/2009   HYPERLIPIDEMIA 04/28/2007   HYPERTENSION 10/21/2006   NECK MASS 02/07/2010   OBESITY 10/24/2006   OTITIS MEDIA, ACUTE, LEFT 12/26/2009   PHIMOSIS 02/07/2009   S/P laparoscopic cholecystectomy 10/31/2010   Thyroid cancer (HCC) 06/13/2010   THYROID NODULE 02/07/2010   Past Surgical History:  Procedure Laterality Date   CHOLECYSTECTOMY     left knee surgery     right wrist surgury     THYROID SURGERY      reports that he has quit smoking. He has never  used smokeless tobacco. He reports that he does not drink alcohol and does not use drugs. family history includes Arthritis in his mother; Cancer in his brother and sister; Colon cancer (age of onset: 62) in his brother; Diabetes in his brother; Goiter in his mother; Heart attack (age of onset: 4) in his brother; Heart attack (age of onset: 65) in his father; Hypertension in his mother; Hypothyroidism in his sister. Allergies  Allergen Reactions   Diphenoxylate-Atropine Other (See Comments)   Other Other (See Comments) and Hives    Causes body to ache Causes body to ache   Sulfa Antibiotics Other (See Comments)    As child almost died   Ace Inhibitors     REACTION: cough   Atenolol     REACTION: bradycardia   Codeine Other (See Comments)    Head spins "wild"   Levaquin [Levofloxacin]     Interferes with flecainide   Lovastatin     REACTION: myalygros   Morphine Nausea And Vomiting   Morphine And Codeine Other (See Comments)   Nsaids     Heart patient   Statins Other (See Comments)    Causes body to ache   Sulfamethoxazole Other (See Comments)    Childhood unknown reaction   Sulfasalazine Other (See Comments)    As child almost died   Tramadol  Ants crawling all over   Current Outpatient Medications on File Prior to Visit  Medication Sig Dispense Refill   amiodarone (PACERONE) 200 MG tablet Take by mouth.     amLODipine (NORVASC) 2.5 MG tablet Take 1 tablet (2.5 mg total) by mouth daily. 90 tablet 3   apixaban (ELIQUIS) 2.5 MG TABS tablet Take 1 tablet (2.5 mg total) by mouth 2 (two) times daily. (Patient taking differently: Take 5 mg by mouth 2 (two) times daily.) 180 tablet 3   Ascorbic Acid (VITAMIN C) 1000 MG tablet Take 1,000 mg by mouth daily.     augmented betamethasone dipropionate (DIPROLENE-AF) 0.05 % ointment betamethasone, augmented 0.05 % topical ointment  APPLY OINTMENT TOPICALLY TO EACH EAR AS NEEDED FOR ITCHING     Blood Glucose Monitoring Suppl (ONETOUCH  VERIO FLEX SYSTEM) w/Device KIT Use as directed three times per day E11.9 1 kit 0   Calcium Carb-Cholecalciferol [phone] MG-UNIT CAPS Take by mouth.     ciclopirox (PENLAC) 8 % solution      Cinnamon 500 MG capsule Take 500 mg by mouth 2 (two) times daily.     cyanocobalamin 1000 MCG tablet Take by mouth.     Diclofenac Sodium (PENNSAID) 2 % SOLN Pennsaid 20 mg/gram/actuation (2 %) topical soln in metered-dose pump  APPLY 2 PUMPS (40 MG) TO THE AFFECTED KNEE BY TOPICAL ROUTE 2 TIMES PER DAY     ezetimibe (ZETIA) 10 MG tablet Take 1 tablet (10 mg total) by mouth daily. 90 tablet 3   Flaxseed, Linseed, 1000 MG CAPS Take 1 capsule by mouth daily.     Garlic Oil 500 MG TABS Take by mouth 2 (two) times daily.     Ginger, Zingiber officinalis, (GINGER PO) Take by mouth 4 (four) times daily.     Glucosamine-Chondroit-Vit C-Mn (GLUCOSAMINE CHONDROITIN COMPLX) CAPS Take by mouth daily.     Glucosamine-Chondroitin 250-200 MG TABS Take 1 tablet by mouth at bedtime.     glucose blood (ACCU-CHEK GUIDE) test strip Use as instructed four time per day E11.9 400 each 12   hydrocortisone 2.5 % ointment Apply topically 2 (two) times daily.     isosorbide mononitrate (IMDUR) 60 MG 24 hr tablet Take by mouth.     ketoconazole (NIZORAL) 2 % cream ketoconazole 2 % topical cream  APPLY CREAM TOPICALLY ONCE DAILY FOR 30 DAYS     Lancets MISC Use as directed three times per day E11.9 300 each 3   levothyroxine (SYNTHROID) 112 MCG tablet Take 1 tablet (112 mcg total) by mouth daily. 90 tablet 3   losartan (COZAAR) 100 MG tablet Take 1 tablet (100 mg total) by mouth daily. 90 tablet 3   metFORMIN (GLUCOPHAGE-XR) 500 MG 24 hr tablet Take 1 tablet by mouth twice daily 180 tablet 3   metoprolol succinate (TOPROL-XL) 25 MG 24 hr tablet      Multiple Vitamin (MULTIVITAMIN) capsule Take 1 capsule by mouth daily.     nitroGLYCERIN (NITROSTAT) 0.4 MG SL tablet      Omega-3 Fatty Acids (FISH OIL) 1000 MG CAPS Take by mouth 2  (two) times daily.     pantoprazole (PROTONIX) 40 MG tablet Take 2 tablets by mouth once daily 180 tablet 3   prasugrel (EFFIENT) 10 MG TABS tablet Take 10 mg by mouth daily.     triamcinolone (KENALOG) 0.025 % cream Apply topically.     VITAMIN D, CHOLECALCIFEROL, PO Take by mouth daily.     No current facility-administered medications on  file prior to visit.        ROS:  All others reviewed and negative.  Objective        PE:  BP 118/68 (BP Location: Right Arm, Patient Position: Sitting, Cuff Size: Normal)   Pulse (!) 53   Temp 98.5 F (36.9 C) (Oral)   Ht 6' (1.829 m)   Wt 212 lb (96.2 kg)   SpO2 98%   BMI 28.75 kg/m                 Constitutional: Pt appears in NAD, fatgued slow in movements , mild ill appearing               HENT: Head: NCAT.                Right Ear: External ear normal.                 Left Ear: External ear normal.                Eyes: . Pupils are equal, round, and reactive to light. Conjunctivae and EOM are normal               Nose: without d/c or deformity               Neck: Neck supple. Gross normal ROM               Cardiovascular: Normal rate and regular rhythm.                 Pulmonary/Chest: Effort normal and breath sounds without rales or wheezing.                Abd:  Soft, NT, ND, + BS, no organomegaly, no flank tender               Neurological: Pt is alert. At baseline orientation, motor grossly intact               Skin: Skin is warm. No rashes, no other new lesions, LE edema - none               Psychiatric: Pt behavior is normal without agitation   Micro: none  Cardiac tracings I have personally interpreted today:  none  Pertinent Radiological findings (summarize): none   Lab Results  Component Value Date   WBC 5.8 10/30/2022   HGB 10.8 (L) 10/30/2022   HCT 33.2 (L) 10/30/2022   PLT 382.0 10/30/2022   GLUCOSE 140 (H) 10/30/2022   CHOL 136 10/30/2022   TRIG 173.0 (H) 10/30/2022   HDL 40.00 10/30/2022   LDLDIRECT 80.0  03/18/2022   LDLCALC 61 10/30/2022   ALT 18 10/30/2022   AST 23 10/30/2022   NA 140 10/30/2022   K 4.0 10/30/2022   CL 108 10/30/2022   CREATININE 1.67 (H) 10/30/2022   BUN 34 (H) 10/30/2022   CO2 24 10/30/2022   TSH 4.42 09/24/2022   PSA 3.59 05/08/2017   INR 1.0 08/22/2008   HGBA1C 6.4 10/30/2022   MICROALBUR 1.8 09/24/2022   Assessment/Plan:  Nathan Russo is a 82 y.o. White or Caucasian [1] male with  has a past medical history of Abdominal pain, epigastric (04/11/2010), BELCHING (04/23/2010), BRADYCARDIA, CHRONIC (10/24/2006), CARPAL TUNNEL SYNDROME, BILATERAL (02/07/2009), CHEST PAIN-UNSPECIFIED (09/05/2008), CHOLELITHIASIS (04/22/2010), Cholelithiasis (06/13/2010), COLONIC POLYPS, HX OF (04/28/2007), DEGENERATIVE JOINT DISEASE, RIGHT KNEE (10/24/2006), Depression (02/23/2011), DIABETES MELLITUS, TYPE II (04/28/2007), Dizziness and giddiness (12/19/2009), Elevated PSA (06/13/2010), FATIGUE (04/28/2007),  GERD (02/11/2009), Headache(784.0) (12/19/2009), HYPERLIPIDEMIA (04/28/2007), HYPERTENSION (10/21/2006), NECK MASS (02/07/2010), OBESITY (10/24/2006), OTITIS MEDIA, ACUTE, LEFT (12/26/2009), PHIMOSIS (02/07/2009), S/P laparoscopic cholecystectomy (10/31/2010), Thyroid cancer (HCC) (06/13/2010), and THYROID NODULE (02/07/2010).  Diabetes mellitus (HCC) Lab Results  Component Value Date   HGBA1C 6.4 10/30/2022   Stable, pt to continue current medical treatment metfomrin ER 500 bid   Dysuria Mod to high suspision for uti  - for empiric rocephin 1 gm, and urine culture, for further antbx pending results  Community acquired pneumonia Can't r/o recurrence - for cxr, consider further antibx pending results  Essential hypertension BP Readings from Last 3 Encounters:  10/30/22 118/68  10/17/22 132/68  09/24/22 (!) 140/68   Stable, pt to continue medical treatment norvasc 2.5 mg every day, losartan 100 qd   CKD (chronic kidney disease) stage 4, GFR 15-29 ml/min (HCC) Lab Results  Component Value Date    CREATININE 1.67 (H) 10/30/2022   Stable overall, cont to avoid nephrotoxins  Followup: Return in about 1 week (around 11/06/2022).  Oliver Barre, MD 11/01/2022 2:38 PM [facility]

## 2022-11-06 ENCOUNTER — Ambulatory Visit (INDEPENDENT_AMBULATORY_CARE_PROVIDER_SITE_OTHER): Payer: Medicare (Managed Care) | Admitting: Internal Medicine

## 2022-11-06 ENCOUNTER — Encounter: Payer: Self-pay | Admitting: Internal Medicine

## 2022-11-06 VITALS — BP 126/72 | HR 55 | Temp 98.7°F | Ht 72.0 in | Wt 207.0 lb

## 2022-11-06 DIAGNOSIS — N184 Chronic kidney disease, stage 4 (severe): Secondary | ICD-10-CM

## 2022-11-06 DIAGNOSIS — R5383 Other fatigue: Secondary | ICD-10-CM | POA: Diagnosis not present

## 2022-11-06 DIAGNOSIS — I10 Essential (primary) hypertension: Secondary | ICD-10-CM

## 2022-11-06 DIAGNOSIS — E1140 Type 2 diabetes mellitus with diabetic neuropathy, unspecified: Secondary | ICD-10-CM | POA: Diagnosis not present

## 2022-11-06 DIAGNOSIS — E7800 Pure hypercholesterolemia, unspecified: Secondary | ICD-10-CM | POA: Diagnosis not present

## 2022-11-06 DIAGNOSIS — Z7984 Long term (current) use of oral hypoglycemic drugs: Secondary | ICD-10-CM | POA: Diagnosis not present

## 2022-11-06 DIAGNOSIS — D649 Anemia, unspecified: Secondary | ICD-10-CM | POA: Diagnosis not present

## 2022-11-06 NOTE — Progress Notes (Signed)
Patient ID: Nathan Russo, male   DOB: 12-Apr-1940, 82 y.o.   MRN: [id number]        Chief Complaint: follow up recent fatigue, anemia, ckd3a, dm, htn       HPI:  Nathan Russo is a 82 y.o. male here after episode fatigue last wk for unclear reason, urine culture neg, and cxr not resulted for some reason, did have mild worsening anemia however  Feels overall improved but still mild fatigue today. No overt bleed, bruising.  Pt denies chest pain, increased sob or doe, wheezing, orthopnea, PND, increased LE swelling, palpitations, dizziness or syncope.   Pt denies polydipsia, polyuria, or new focal neuro s/s.    Pt denies fever, wt loss, night sweats, loss of appetite, or other constitutional symptoms  Wt Readings from Last 3 Encounters:  11/06/22 207 lb (93.9 kg)  10/30/22 212 lb (96.2 kg)  10/17/22 211 lb 3.2 oz (95.8 kg)   BP Readings from Last 3 Encounters:  11/06/22 126/72  10/30/22 118/68  10/17/22 132/68         Past Medical History:  Diagnosis Date   Abdominal pain, epigastric 04/11/2010   BELCHING 04/23/2010   BRADYCARDIA, CHRONIC 10/24/2006   CARPAL TUNNEL SYNDROME, BILATERAL 02/07/2009   CHEST PAIN-UNSPECIFIED 09/05/2008   CHOLELITHIASIS 04/22/2010   Cholelithiasis 06/13/2010   COLONIC POLYPS, HX OF 04/28/2007   DEGENERATIVE JOINT DISEASE, RIGHT KNEE 10/24/2006   Depression 02/23/2011   DIABETES MELLITUS, TYPE II 04/28/2007   Dizziness and giddiness 12/19/2009   Elevated PSA 06/13/2010   FATIGUE 04/28/2007   GERD 02/11/2009   Headache(784.0) 12/19/2009   HYPERLIPIDEMIA 04/28/2007   HYPERTENSION 10/21/2006   NECK MASS 02/07/2010   OBESITY 10/24/2006   OTITIS MEDIA, ACUTE, LEFT 12/26/2009   PHIMOSIS 02/07/2009   S/P laparoscopic cholecystectomy 10/31/2010   Thyroid cancer (HCC) 06/13/2010   THYROID NODULE 02/07/2010   Past Surgical History:  Procedure Laterality Date   CHOLECYSTECTOMY     left knee surgery     right wrist surgury     THYROID SURGERY      reports that he has quit  smoking. He has never used smokeless tobacco. He reports that he does not drink alcohol and does not use drugs. family history includes Arthritis in his mother; Cancer in his brother and sister; Colon cancer (age of onset: 31) in his brother; Diabetes in his brother; Goiter in his mother; Heart attack (age of onset: 58) in his brother; Heart attack (age of onset: 13) in his father; Hypertension in his mother; Hypothyroidism in his sister. Allergies  Allergen Reactions   Diphenoxylate-Atropine Other (See Comments)   Other Other (See Comments) and Hives    Causes body to ache Causes body to ache   Sulfa Antibiotics Other (See Comments)    As child almost died   Ace Inhibitors     REACTION: cough   Atenolol     REACTION: bradycardia   Codeine Other (See Comments)    Head spins "wild"   Levaquin [Levofloxacin]     Interferes with flecainide   Lovastatin     REACTION: myalygros   Morphine Nausea And Vomiting   Morphine And Codeine Other (See Comments)   Nsaids     Heart patient   Statins Other (See Comments)    Causes body to ache   Sulfamethoxazole Other (See Comments)    Childhood unknown reaction   Sulfasalazine Other (See Comments)    As child almost died   Tramadol  Ants crawling all over   Current Outpatient Medications on File Prior to Visit  Medication Sig Dispense Refill   amiodarone (PACERONE) 200 MG tablet Take by mouth.     amLODipine (NORVASC) 2.5 MG tablet Take 1 tablet (2.5 mg total) by mouth daily. 90 tablet 3   apixaban (ELIQUIS) 2.5 MG TABS tablet Take 1 tablet (2.5 mg total) by mouth 2 (two) times daily. (Patient taking differently: Take 5 mg by mouth 2 (two) times daily.) 180 tablet 3   Ascorbic Acid (VITAMIN C) 1000 MG tablet Take 1,000 mg by mouth daily.     augmented betamethasone dipropionate (DIPROLENE-AF) 0.05 % ointment betamethasone, augmented 0.05 % topical ointment  APPLY OINTMENT TOPICALLY TO EACH EAR AS NEEDED FOR ITCHING     Blood Glucose  Monitoring Suppl (ONETOUCH VERIO FLEX SYSTEM) w/Device KIT Use as directed three times per day E11.9 1 kit 0   Calcium Carb-Cholecalciferol [phone] MG-UNIT CAPS Take by mouth.     ciclopirox (PENLAC) 8 % solution      Cinnamon 500 MG capsule Take 500 mg by mouth 2 (two) times daily.     cyanocobalamin 1000 MCG tablet Take by mouth.     Diclofenac Sodium (PENNSAID) 2 % SOLN Pennsaid 20 mg/gram/actuation (2 %) topical soln in metered-dose pump  APPLY 2 PUMPS (40 MG) TO THE AFFECTED KNEE BY TOPICAL ROUTE 2 TIMES PER DAY     ezetimibe (ZETIA) 10 MG tablet Take 1 tablet (10 mg total) by mouth daily. 90 tablet 3   Flaxseed, Linseed, 1000 MG CAPS Take 1 capsule by mouth daily.     Garlic Oil 500 MG TABS Take by mouth 2 (two) times daily.     Ginger, Zingiber officinalis, (GINGER PO) Take by mouth 4 (four) times daily.     Glucosamine-Chondroit-Vit C-Mn (GLUCOSAMINE CHONDROITIN COMPLX) CAPS Take by mouth daily.     Glucosamine-Chondroitin 250-200 MG TABS Take 1 tablet by mouth at bedtime.     hydrocortisone 2.5 % ointment Apply topically 2 (two) times daily.     isosorbide mononitrate (IMDUR) 60 MG 24 hr tablet Take by mouth.     ketoconazole (NIZORAL) 2 % cream ketoconazole 2 % topical cream  APPLY CREAM TOPICALLY ONCE DAILY FOR 30 DAYS     Lancets MISC Use as directed three times per day E11.9 300 each 3   levothyroxine (SYNTHROID) 112 MCG tablet Take 1 tablet (112 mcg total) by mouth daily. 90 tablet 3   losartan (COZAAR) 100 MG tablet Take 1 tablet (100 mg total) by mouth daily. 90 tablet 3   metFORMIN (GLUCOPHAGE-XR) 500 MG 24 hr tablet Take 1 tablet by mouth twice daily 180 tablet 3   metoprolol succinate (TOPROL-XL) 25 MG 24 hr tablet      Multiple Vitamin (MULTIVITAMIN) capsule Take 1 capsule by mouth daily.     nitroGLYCERIN (NITROSTAT) 0.4 MG SL tablet      Omega-3 Fatty Acids (FISH OIL) 1000 MG CAPS Take by mouth 2 (two) times daily.     pantoprazole (PROTONIX) 40 MG tablet Take 2  tablets by mouth once daily 180 tablet 3   prasugrel (EFFIENT) 10 MG TABS tablet Take 10 mg by mouth daily.     triamcinolone (KENALOG) 0.025 % cream Apply topically.     VITAMIN D, CHOLECALCIFEROL, PO Take by mouth daily.     No current facility-administered medications on file prior to visit.        ROS:  All others reviewed and negative.  Objective        PE:  BP 126/72 (BP Location: Right Arm, Patient Position: Sitting, Cuff Size: Normal)   Pulse (!) 55   Temp 98.7 F (37.1 C) (Oral)   Ht 6' (1.829 m)   Wt 207 lb (93.9 kg)   SpO2 98%   BMI 28.07 kg/m                 Constitutional: Pt appears in NAD, fatigued, non toxic less ill appearing               HENT: Head: NCAT.                Right Ear: External ear normal.                 Left Ear: External ear normal.                Eyes: . Pupils are equal, round, and reactive to light. Conjunctivae and EOM are normal               Nose: without d/c or deformity               Neck: Neck supple. Gross normal ROM               Cardiovascular: Normal rate and regular rhythm.                 Pulmonary/Chest: Effort normal and breath sounds without rales or wheezing.                Abd:  Soft, NT, ND, + BS, no organomegaly               Neurological: Pt is alert. At baseline orientation, motor grossly intact               Skin: Skin is warm. No rashes, no other new lesions, LE edema - none               Psychiatric: Pt behavior is normal without agitation   Micro: none  Cardiac tracings I have personally interpreted today:  none  Pertinent Radiological findings (summarize): none   Lab Results  Component Value Date   WBC 6.4 11/07/2022   HGB 10.6 (L) 11/07/2022   HCT 33.2 (L) 11/07/2022   PLT 306.0 11/07/2022   GLUCOSE 140 (H) 10/30/2022   CHOL 136 10/30/2022   TRIG 173.0 (H) 10/30/2022   HDL 40.00 10/30/2022   LDLDIRECT 80.0 03/18/2022   LDLCALC 61 10/30/2022   ALT 18 10/30/2022   AST 23 10/30/2022   NA 140 10/30/2022    K 4.0 10/30/2022   CL 108 10/30/2022   CREATININE 1.67 (H) 10/30/2022   BUN 34 (H) 10/30/2022   CO2 24 10/30/2022   TSH 4.42 09/24/2022   PSA 3.59 05/08/2017   INR 1.0 08/22/2008   HGBA1C 6.4 10/30/2022   MICROALBUR 1.8 09/24/2022   Assessment/Plan:  Nathan Russo is a 82 y.o. White or Caucasian [1] male with  has a past medical history of Abdominal pain, epigastric (04/11/2010), BELCHING (04/23/2010), BRADYCARDIA, CHRONIC (10/24/2006), CARPAL TUNNEL SYNDROME, BILATERAL (02/07/2009), CHEST PAIN-UNSPECIFIED (09/05/2008), CHOLELITHIASIS (04/22/2010), Cholelithiasis (06/13/2010), COLONIC POLYPS, HX OF (04/28/2007), DEGENERATIVE JOINT DISEASE, RIGHT KNEE (10/24/2006), Depression (02/23/2011), DIABETES MELLITUS, TYPE II (04/28/2007), Dizziness and giddiness (12/19/2009), Elevated PSA (06/13/2010), FATIGUE (04/28/2007), GERD (02/11/2009), Headache(784.0) (12/19/2009), HYPERLIPIDEMIA (04/28/2007), HYPERTENSION (10/21/2006), NECK MASS (02/07/2010), OBESITY (10/24/2006), OTITIS MEDIA, ACUTE, LEFT (12/26/2009), PHIMOSIS (02/07/2009), S/P laparoscopic cholecystectomy (10/31/2010),  Thyroid cancer (HCC) (06/13/2010), and THYROID NODULE (02/07/2010).  CKD (chronic kidney disease) stage 4, GFR 15-29 ml/min (HCC) Lab Results  Component Value Date   CREATININE 1.67 (H) 10/30/2022   Stable overall, cont to avoid nephrotoxins   Diabetes mellitus (HCC) Lab Results  Component Value Date   HGBA1C 6.4 10/30/2022   Stable, pt to continue current medical treatment metformin ER 500 mg bid   Essential hypertension BP Readings from Last 3 Encounters:  11/06/22 126/72  10/30/22 118/68  10/17/22 132/68   Stable, pt to continue medical treatment norvasc 2.5 every day, losartan 100 every day, toprol xl 25 qd   Anemia With gradual worsening recently, no overt bleeding, bruising, for f/u cbc and iron labs, may need GI referral  Hyperlipidemia Lab Results  Component Value Date   LDLCALC 61 10/30/2022   Stable, pt to continue  current statin zetia 10 mg qd   Fatigue For repeat try at cxr, did not result last wk for some reason  Followup: Return in about 5 months (around 03/30/2023), or if symptoms worsen or fail to improve.  Oliver Barre, MD 11/08/2022 2:56 PM [facility]

## 2022-11-06 NOTE — Patient Instructions (Addendum)
Please continue all other medications as before, and refills have been done if requested.  Please have the pharmacy call with any other refills you may need.  Please continue your efforts at being more active, low cholesterol diet, and weight control.  Please keep your appointments with your specialists as you may have planned  Please go to the LAB at the blood drawing area for the tests to be done  You will be contacted by phone if any changes need to be made immediately.  Otherwise, you will receive a letter about your results with an explanation, but please check with MyChart first.  Please make an Appointment to return in Jan 20, or sooner if needed

## 2022-11-07 ENCOUNTER — Other Ambulatory Visit: Payer: Self-pay | Admitting: Internal Medicine

## 2022-11-07 ENCOUNTER — Other Ambulatory Visit: Payer: Self-pay

## 2022-11-07 ENCOUNTER — Telehealth: Payer: Self-pay | Admitting: Internal Medicine

## 2022-11-07 DIAGNOSIS — D509 Iron deficiency anemia, unspecified: Secondary | ICD-10-CM

## 2022-11-07 LAB — CBC WITH DIFFERENTIAL/PLATELET
Basophils Absolute: 0.1 10*3/uL (ref 0.0–0.1)
Basophils Relative: 0.8 % (ref 0.0–3.0)
Eosinophils Absolute: 0.2 10*3/uL (ref 0.0–0.7)
Eosinophils Relative: 2.6 % (ref 0.0–5.0)
HCT: 33.2 % — ABNORMAL LOW (ref 39.0–52.0)
Hemoglobin: 10.6 g/dL — ABNORMAL LOW (ref 13.0–17.0)
Lymphocytes Relative: 26.2 % (ref 12.0–46.0)
Lymphs Abs: 1.7 10*3/uL (ref 0.7–4.0)
MCHC: 32 g/dL (ref 30.0–36.0)
MCV: 84.6 fl (ref 78.0–100.0)
Monocytes Absolute: 0.5 10*3/uL (ref 0.1–1.0)
Monocytes Relative: 7 % (ref 3.0–12.0)
Neutro Abs: 4.1 10*3/uL (ref 1.4–7.7)
Neutrophils Relative %: 63.4 % (ref 43.0–77.0)
Platelets: 306 10*3/uL (ref 150.0–400.0)
RBC: 3.93 Mil/uL — ABNORMAL LOW (ref 4.22–5.81)
RDW: 16.4 % — ABNORMAL HIGH (ref 11.5–15.5)
WBC: 6.4 10*3/uL (ref 4.0–10.5)

## 2022-11-07 LAB — IBC PANEL
Iron: 45 ug/dL (ref 42–165)
Saturation Ratios: 13.9 % — ABNORMAL LOW (ref 20.0–50.0)
TIBC: 324.8 ug/dL (ref 250.0–450.0)
Transferrin: 232 mg/dL (ref 212.0–360.0)

## 2022-11-07 LAB — FERRITIN: Ferritin: 18.7 ng/mL — ABNORMAL LOW (ref 22.0–322.0)

## 2022-11-07 MED ORDER — ACCU-CHEK GUIDE VI STRP: ORAL_STRIP | 12 refills | Status: DC

## 2022-11-07 MED ORDER — FUROSEMIDE 40 MG PO TABS: 40.0000 mg | ORAL_TABLET | Freq: Every day | ORAL | 0 refills | Status: DC

## 2022-11-07 MED ORDER — POLYSACCHARIDE IRON COMPLEX 150 MG PO CAPS: 150.0000 mg | ORAL_CAPSULE | Freq: Every day | ORAL | 1 refills | Status: DC

## 2022-11-07 NOTE — Telephone Encounter (Signed)
Prescription Request  11/07/2022  LOV: 11/06/2022  What is the name of the medication or equipment? glucose blood (ACCU-CHEK GUIDE) test strip   Have you contacted your pharmacy to request a refill? No   Which pharmacy would you like this sent to?  [id number], [place] - [address] [facility] [place] [address] Phone: [phone] Fax: [phone]    Patient notified that their request is being sent to the clinical staff for review and that they should receive a response within 2 business days.   Please advise at Mobile [phone] (mobile)

## 2022-11-07 NOTE — Telephone Encounter (Signed)
Refill re-sent  

## 2022-11-08 ENCOUNTER — Encounter: Payer: Self-pay | Admitting: Internal Medicine

## 2022-11-08 DIAGNOSIS — D649 Anemia, unspecified: Secondary | ICD-10-CM | POA: Insufficient documentation

## 2022-11-08 DIAGNOSIS — R5383 Other fatigue: Secondary | ICD-10-CM | POA: Insufficient documentation

## 2022-11-08 NOTE — Assessment & Plan Note (Signed)
Lab Results  Component Value Date   HGBA1C 6.4 10/30/2022   Stable, pt to continue current medical treatment metformin ER 500 mg bid

## 2022-11-08 NOTE — Assessment & Plan Note (Signed)
With gradual worsening recently, no overt bleeding, bruising, for f/u cbc and iron labs, may need GI referral

## 2022-11-08 NOTE — Assessment & Plan Note (Signed)
For repeat try at cxr, did not result last wk for some reason

## 2022-11-08 NOTE — Assessment & Plan Note (Signed)
 Lab Results  Component Value Date   CREATININE 1.67 (H) 10/30/2022   Stable overall, cont to avoid nephrotoxins

## 2022-11-08 NOTE — Assessment & Plan Note (Signed)
Lab Results  Component Value Date   LDLCALC 61 10/30/2022   Stable, pt to continue current statin zetia 10 mg qd

## 2022-11-08 NOTE — Assessment & Plan Note (Signed)
BP Readings from Last 3 Encounters:  11/06/22 126/72  10/30/22 118/68  10/17/22 132/68   Stable, pt to continue medical treatment norvasc 2.5 every day, losartan 100 every day, toprol xl 25 qd

## 2022-11-09 ENCOUNTER — Observation Stay (HOSPITAL_COMMUNITY)
Admission: EM | Admit: 2022-11-09 | Discharge: 2022-11-09 | Disposition: A | Discharge status: Home / Self Care | Payer: Medicare (Managed Care) | Attending: Family Medicine | Admitting: Family Medicine

## 2022-11-09 ENCOUNTER — Encounter (HOSPITAL_COMMUNITY): Payer: Self-pay

## 2022-11-09 ENCOUNTER — Emergency Department (HOSPITAL_COMMUNITY): Payer: Medicare (Managed Care)

## 2022-11-09 DIAGNOSIS — Z7984 Long term (current) use of oral hypoglycemic drugs: Secondary | ICD-10-CM | POA: Insufficient documentation

## 2022-11-09 DIAGNOSIS — I4892 Unspecified atrial flutter: Secondary | ICD-10-CM

## 2022-11-09 DIAGNOSIS — R9431 Abnormal electrocardiogram [ECG] [EKG]: Secondary | ICD-10-CM | POA: Insufficient documentation

## 2022-11-09 DIAGNOSIS — N1832 Chronic kidney disease, stage 3b: Secondary | ICD-10-CM | POA: Diagnosis present

## 2022-11-09 DIAGNOSIS — Z87891 Personal history of nicotine dependence: Secondary | ICD-10-CM | POA: Diagnosis not present

## 2022-11-09 DIAGNOSIS — G4733 Obstructive sleep apnea (adult) (pediatric): Secondary | ICD-10-CM

## 2022-11-09 DIAGNOSIS — I10 Essential (primary) hypertension: Secondary | ICD-10-CM | POA: Diagnosis not present

## 2022-11-09 DIAGNOSIS — E039 Hypothyroidism, unspecified: Secondary | ICD-10-CM | POA: Diagnosis present

## 2022-11-09 DIAGNOSIS — I4891 Unspecified atrial fibrillation: Secondary | ICD-10-CM | POA: Diagnosis not present

## 2022-11-09 DIAGNOSIS — Z794 Long term (current) use of insulin: Secondary | ICD-10-CM | POA: Insufficient documentation

## 2022-11-09 DIAGNOSIS — I951 Orthostatic hypotension: Secondary | ICD-10-CM | POA: Diagnosis not present

## 2022-11-09 DIAGNOSIS — R61 Generalized hyperhidrosis: Secondary | ICD-10-CM | POA: Diagnosis not present

## 2022-11-09 DIAGNOSIS — K4020 Bilateral inguinal hernia, without obstruction or gangrene, not specified as recurrent: Secondary | ICD-10-CM | POA: Diagnosis not present

## 2022-11-09 DIAGNOSIS — I959 Hypotension, unspecified: Secondary | ICD-10-CM | POA: Diagnosis not present

## 2022-11-09 DIAGNOSIS — Z85850 Personal history of malignant neoplasm of thyroid: Secondary | ICD-10-CM | POA: Insufficient documentation

## 2022-11-09 DIAGNOSIS — I2510 Atherosclerotic heart disease of native coronary artery without angina pectoris: Secondary | ICD-10-CM | POA: Diagnosis not present

## 2022-11-09 DIAGNOSIS — R911 Solitary pulmonary nodule: Secondary | ICD-10-CM | POA: Diagnosis not present

## 2022-11-09 DIAGNOSIS — R918 Other nonspecific abnormal finding of lung field: Secondary | ICD-10-CM | POA: Diagnosis present

## 2022-11-09 DIAGNOSIS — Z79899 Other long term (current) drug therapy: Secondary | ICD-10-CM | POA: Insufficient documentation

## 2022-11-09 DIAGNOSIS — R42 Dizziness and giddiness: Secondary | ICD-10-CM | POA: Diagnosis not present

## 2022-11-09 DIAGNOSIS — E1122 Type 2 diabetes mellitus with diabetic chronic kidney disease: Secondary | ICD-10-CM | POA: Diagnosis not present

## 2022-11-09 DIAGNOSIS — Z7901 Long term (current) use of anticoagulants: Secondary | ICD-10-CM | POA: Diagnosis not present

## 2022-11-09 DIAGNOSIS — E119 Type 2 diabetes mellitus without complications: Secondary | ICD-10-CM

## 2022-11-09 DIAGNOSIS — I4430 Unspecified atrioventricular block: Secondary | ICD-10-CM | POA: Diagnosis not present

## 2022-11-09 DIAGNOSIS — I129 Hypertensive chronic kidney disease with stage 1 through stage 4 chronic kidney disease, or unspecified chronic kidney disease: Secondary | ICD-10-CM | POA: Diagnosis not present

## 2022-11-09 DIAGNOSIS — R55 Syncope and collapse: Secondary | ICD-10-CM | POA: Diagnosis not present

## 2022-11-09 DIAGNOSIS — R1900 Intra-abdominal and pelvic swelling, mass and lump, unspecified site: Secondary | ICD-10-CM | POA: Diagnosis not present

## 2022-11-09 LAB — CBG MONITORING, ED: Glucose-Capillary: 150 mg/dL — ABNORMAL HIGH (ref 70–99)

## 2022-11-09 LAB — CBC WITH DIFFERENTIAL/PLATELET
Abs Immature Granulocytes: 0.1 10*3/uL — ABNORMAL HIGH (ref 0.00–0.07)
Basophils Absolute: 0.1 10*3/uL (ref 0.0–0.1)
Basophils Relative: 1 %
Eosinophils Absolute: 0.1 10*3/uL (ref 0.0–0.5)
Eosinophils Relative: 1 %
HCT: 36.1 % — ABNORMAL LOW (ref 39.0–52.0)
Hemoglobin: 11.1 g/dL — ABNORMAL LOW (ref 13.0–17.0)
Immature Granulocytes: 1 %
Lymphocytes Relative: 10 %
Lymphs Abs: 0.8 10*3/uL (ref 0.7–4.0)
MCH: 26.7 pg (ref 26.0–34.0)
MCHC: 30.7 g/dL (ref 30.0–36.0)
MCV: 86.8 fL (ref 80.0–100.0)
Monocytes Absolute: 0.4 10*3/uL (ref 0.1–1.0)
Monocytes Relative: 5 %
Neutro Abs: 6.4 10*3/uL (ref 1.7–7.7)
Neutrophils Relative %: 82 %
Platelets: 299 10*3/uL (ref 150–400)
RBC: 4.16 MIL/uL — ABNORMAL LOW (ref 4.22–5.81)
RDW: 15.8 % — ABNORMAL HIGH (ref 11.5–15.5)
WBC: 7.8 10*3/uL (ref 4.0–10.5)
nRBC: 0 % (ref 0.0–0.2)

## 2022-11-09 LAB — HEPATIC FUNCTION PANEL
ALT: 21 U/L (ref 0–44)
AST: 23 U/L (ref 15–41)
Albumin: 3.7 g/dL (ref 3.5–5.0)
Alkaline Phosphatase: 57 U/L (ref 38–126)
Bilirubin, Direct: <0.1 mg/dL (ref 0.0–0.2)
Total Bilirubin: 1.1 mg/dL (ref 0.3–1.2)
Total Protein: 7 g/dL (ref 6.5–8.1)

## 2022-11-09 LAB — BASIC METABOLIC PANEL
Anion gap: 10 (ref 5–15)
BUN: 34 mg/dL — ABNORMAL HIGH (ref 8–23)
CO2: 23 mmol/L (ref 22–32)
Calcium: 9.1 mg/dL (ref 8.9–10.3)
Chloride: 103 mmol/L (ref 98–111)
Creatinine, Ser: 1.67 mg/dL — ABNORMAL HIGH (ref 0.61–1.24)
GFR, Estimated: 40 mL/min — ABNORMAL LOW (ref >60)
Glucose, Bld: 156 mg/dL — ABNORMAL HIGH (ref 70–99)
Potassium: 3.9 mmol/L (ref 3.5–5.1)
Sodium: 136 mmol/L (ref 135–145)

## 2022-11-09 LAB — LIPASE, BLOOD: Lipase: 35 U/L (ref 11–51)

## 2022-11-09 LAB — TROPONIN I (HIGH SENSITIVITY): Troponin I (High Sensitivity): 4 ng/L (ref <18)

## 2022-11-09 LAB — TSH: TSH: 8.272 u[IU]/mL — ABNORMAL HIGH (ref 0.350–4.500)

## 2022-11-09 MED ORDER — POTASSIUM CHLORIDE CRYS ER 20 MEQ PO TBCR
20.0000 meq | EXTENDED_RELEASE_TABLET | Freq: Once | ORAL | Status: AC
  Administered 2022-11-09: 20 meq via ORAL
  Filled 2022-11-09: qty 1

## 2022-11-09 MED ORDER — ACETAMINOPHEN 650 MG RE SUPP: 650.0000 mg | Freq: Four times a day (QID) | RECTAL | Status: DC | PRN

## 2022-11-09 MED ORDER — AMLODIPINE BESYLATE 5 MG PO TABS: 2.5000 mg | ORAL_TABLET | Freq: Every day | ORAL | Status: DC

## 2022-11-09 MED ORDER — SODIUM CHLORIDE 0.9% FLUSH: 3.0000 mL | Freq: Two times a day (BID) | INTRAVENOUS | Status: DC

## 2022-11-09 MED ORDER — SODIUM CHLORIDE 0.9 % IV BOLUS
500.0000 mL | Freq: Once | INTRAVENOUS | Status: AC
  Administered 2022-11-09: 500 mL via INTRAVENOUS

## 2022-11-09 MED ORDER — PRASUGREL HCL 10 MG PO TABS: 10.0000 mg | ORAL_TABLET | Freq: Every day | ORAL | Status: DC

## 2022-11-09 MED ORDER — ISOSORBIDE MONONITRATE ER 60 MG PO TB24: 60.0000 mg | ORAL_TABLET | Freq: Every day | ORAL | Status: DC

## 2022-11-09 MED ORDER — LOSARTAN POTASSIUM 25 MG PO TABS: 100.0000 mg | ORAL_TABLET | Freq: Every day | ORAL | Status: DC

## 2022-11-09 MED ORDER — INSULIN ASPART 100 UNIT/ML IJ SOLN
0.0000 [IU] | Freq: Three times a day (TID) | INTRAMUSCULAR | Status: DC
  Filled 2022-11-09: qty 0.06

## 2022-11-09 MED ORDER — PANTOPRAZOLE SODIUM 40 MG PO TBEC: 40.0000 mg | DELAYED_RELEASE_TABLET | Freq: Every day | ORAL | Status: DC

## 2022-11-09 MED ORDER — METOPROLOL SUCCINATE ER 25 MG PO TB24: 12.5000 mg | ORAL_TABLET | Freq: Every day | ORAL | Status: DC

## 2022-11-09 MED ORDER — AMIODARONE HCL 200 MG PO TABS: 200.0000 mg | ORAL_TABLET | Freq: Every day | ORAL | Status: DC

## 2022-11-09 MED ORDER — INSULIN ASPART 100 UNIT/ML IJ SOLN
0.0000 [IU] | Freq: Every day | INTRAMUSCULAR | Status: DC
  Filled 2022-11-09: qty 0.05

## 2022-11-09 MED ORDER — SODIUM CHLORIDE 0.9 % IV SOLN: INTRAVENOUS | Status: DC

## 2022-11-09 MED ORDER — APIXABAN 5 MG PO TABS: 5.0000 mg | ORAL_TABLET | Freq: Two times a day (BID) | ORAL | Status: DC

## 2022-11-09 MED ORDER — LEVOTHYROXINE SODIUM 25 MCG PO TABS: 125.0000 ug | ORAL_TABLET | Freq: Every day | ORAL | Status: DC

## 2022-11-09 MED ORDER — LEVOTHYROXINE SODIUM 112 MCG PO TABS: 112.0000 ug | ORAL_TABLET | Freq: Every day | ORAL | Status: DC

## 2022-11-09 MED ORDER — ACETAMINOPHEN 325 MG PO TABS: 650.0000 mg | ORAL_TABLET | Freq: Four times a day (QID) | ORAL | Status: DC | PRN

## 2022-11-09 NOTE — Discharge Instructions (Signed)
Please drink plenty of fluids over the next few days and call your cardiologist for follow-up tomorrow.  Please call your oncologist as well regarding the pulmonary nodules.  Please return to the emergency department for worsening symptoms.

## 2022-11-09 NOTE — ED Triage Notes (Addendum)
Pt arrived via EMS, from home but was out to lunch.  Recent recurrent infections, has been steadily increasing weakness. Felt faint while out at lunch.    18G L AC 100 cc LR

## 2022-11-09 NOTE — ED Notes (Signed)
Ambulated with patient in the hallway from the restroom, past room 8 and back to his room.  Patient's SpO2 remained between 98-100% on RA throughout

## 2022-11-09 NOTE — Consult Note (Signed)
Initial Consultation Note   Patient: Nathan Russo YNW:[id number] DOB: 21-Jan-1941 PCP: Corwin Levins, MD DOA: 11/09/2022 DOS: the patient was seen and examined on 11/09/2022 Primary service: Briscoe Deutscher, MD  Referring physician: Dr. Rhae Hammock  Reason for consult: Near-syncope   Assessment/Plan:  1. Near-syncope; orthostasis  - Episodes have occurred while standing only   - Stop Lasix, hydrate with IVF, follow-up with your primary cardiologist     2. Atrial fibrillation  - Continue Eliquis and metoprolol, repeat EKG in am prior to continuing amiodarone (prolonged QT)     3. Hypothyroidism  - TSH is elevated in ED  - Check free T4, continue Synthroid     4. CAD  - No anginal symptoms  - Continue prasugrel, metoprolol     5. CKD 3B  - Appears close to baseline  - Renally-dose medications, monitor     6. Hypertension  - Continue Norvasc, losartan, and metoprolol     7. Pulmonary nodules  - Outpatient follow-up as planned      TRH will sign off at present, please call us again when needed.  HPI: Nathan Russo is a pleasant 82 y.o. male with medical history significant for hypertension, type 2 diabetes mellitus, CKD 3B, CAD, atrial fibrillation on Eliquis, and hypothyroidism who presents to the emergency department with lightheadedness.   Patient had a recent chest x-ray with possible pulmonary edema and was prescribed a 3-day course of Lasix.  He took the first dose of Lasix yesterday.  Since then, he has had recurrent bouts of lightheadedness with near syncope when standing.  He had an episode overnight when he woke to urinate where he felt as though he is about to pass out but did not actually lose consciousness.  He then had another episode today when he was standing in line to order food.  He sat down, was given some orange juice, and returned to his usual state.  He denies any chest pain, cough, leg swelling, fever, or chills.   He is aware of the pulmonary nodules that are  concerning for metastatic disease and has follow-up scheduled for later this month.    ED Course: Upon arrival to the ED, patient is found to be afebrile and saturating well on room air with 28 mmHg drop in blood pressure upon standing.  EKG demonstrates sinus rhythm with first-degree AV nodal block and QTc 556 ms.  CT of the abdomen and pelvis is negative for acute findings but notable for enlarging pulmonary nodules concerning for metastatic disease.  Labs are most notable for creatinine 1.67 and TSH 8.272.  Review of Systems: As mentioned in the history of present illness. All other systems reviewed and are negative. Past Medical History:  Diagnosis Date   Abdominal pain, epigastric 04/11/2010   BELCHING 04/23/2010   BRADYCARDIA, CHRONIC 10/24/2006   CARPAL TUNNEL SYNDROME, BILATERAL 02/07/2009   CHEST PAIN-UNSPECIFIED 09/05/2008   CHOLELITHIASIS 04/22/2010   Cholelithiasis 06/13/2010   COLONIC POLYPS, HX OF 04/28/2007   DEGENERATIVE JOINT DISEASE, RIGHT KNEE 10/24/2006   Depression 02/23/2011   DIABETES MELLITUS, TYPE II 04/28/2007   Dizziness and giddiness 12/19/2009   Elevated PSA 06/13/2010   FATIGUE 04/28/2007   GERD 02/11/2009   Headache(784.0) 12/19/2009   HYPERLIPIDEMIA 04/28/2007   HYPERTENSION 10/21/2006   NECK MASS 02/07/2010   OBESITY 10/24/2006   OTITIS MEDIA, ACUTE, LEFT 12/26/2009   PHIMOSIS 02/07/2009   S/P laparoscopic cholecystectomy 10/31/2010   Thyroid cancer (HCC) 06/13/2010   THYROID NODULE  02/07/2010   Past Surgical History:  Procedure Laterality Date   CHOLECYSTECTOMY     left knee surgery     right wrist surgury     THYROID SURGERY     Social History:  reports that he has quit smoking. He has never used smokeless tobacco. He reports that he does not drink alcohol and does not use drugs.  Allergies  Allergen Reactions   Diphenoxylate-Atropine Other (See Comments)   Other Other (See Comments) and Hives    Causes body to ache Causes body to ache   Sulfa Antibiotics  Other (See Comments)    As child almost died   Ace Inhibitors     REACTION: cough   Atenolol     REACTION: bradycardia   Codeine Other (See Comments)    Head spins "wild"   Levaquin [Levofloxacin]     Interferes with flecainide   Lovastatin     REACTION: myalygros   Morphine Nausea And Vomiting   Morphine And Codeine Other (See Comments)   Nsaids     Heart patient   Statins Other (See Comments)    Causes body to ache   Sulfamethoxazole Other (See Comments)    Childhood unknown reaction   Sulfasalazine Other (See Comments)    As child almost died   Tramadol     Ants crawling all over    Family History  Problem Relation Age of Onset   Heart attack Brother 64   Diabetes Brother    Cancer Brother        colon   Colon cancer Brother 48   Hypertension Mother    Arthritis Mother    Goiter Mother    Heart attack Father 2   Cancer Sister        breast   Hypothyroidism Sister     Prior to Admission medications   Medication Sig Start Date End Date Taking? Authorizing Provider  amiodarone (PACERONE) 200 MG tablet Take by mouth. 05/17/19  Yes [provider]  amLODipine (NORVASC) 2.5 MG tablet Take 1 tablet (2.5 mg total) by mouth daily. Patient taking differently: Take 2.5 mg by mouth at bedtime. 06/16/22 06/16/23 Yes Corwin Levins, MD  apixaban (ELIQUIS) 5 MG TABS tablet Take 2.5 mg by mouth 2 (two) times daily.   Yes [provider]  Ascorbic Acid (VITAMIN C) 1000 MG tablet Take 1,000 mg by mouth daily.   Yes [provider]  augmented betamethasone dipropionate (DIPROLENE-AF) 0.05 % ointment betamethasone, augmented 0.05 % topical ointment  APPLY OINTMENT TOPICALLY TO EACH EAR AS NEEDED FOR ITCHING   Yes [provider]  Calcium Carb-Cholecalciferol [phone] MG-UNIT CAPS Take by mouth.   Yes [provider]  Cinnamon 500 MG capsule Take 500 mg by mouth 2 (two) times daily.   Yes [provider]  cyanocobalamin 1000 MCG  tablet Take 1,000 mcg by mouth daily.   Yes [provider]  ezetimibe (ZETIA) 10 MG tablet Take 1 tablet (10 mg total) by mouth daily. 05/15/22  Yes Corwin Levins, MD  Flaxseed, Linseed, 1000 MG CAPS Take 1 capsule by mouth daily.   Yes [provider]  Garlic Oil 500 MG TABS Take by mouth 2 (two) times daily.   Yes [provider]  Ginger, Zingiber officinalis, (GINGER PO) Take 1 tablet by mouth in the morning and at bedtime.   Yes [provider]  Glucosamine-Chondroit-Vit C-Mn (GLUCOSAMINE CHONDROITIN COMPLX) CAPS Take by mouth daily.   Yes [provider]  Glucosamine-Chondroitin 250-200 MG TABS Take 1 tablet by mouth at bedtime. 04/25/19  Yes [provider]  hydrocortisone 2.5 % ointment Apply 1 Application topically 2 (two) times daily.   Yes [provider]  iron polysaccharides (NU-IRON) 150 MG capsule Take 1 capsule (150 mg total) by mouth daily. 11/07/22  Yes Corwin Levins, MD  isosorbide mononitrate (IMDUR) 60 MG 24 hr tablet Take by mouth. 08/30/19  Yes [provider]  ketoconazole (NIZORAL) 2 % cream ketoconazole 2 % topical cream  APPLY CREAM TOPICALLY ONCE DAILY FOR 30 DAYS   Yes [provider]  levothyroxine (SYNTHROID) 112 MCG tablet Take 1 tablet (112 mcg total) by mouth daily. 10/18/19  Yes Corwin Levins, MD  losartan (COZAAR) 100 MG tablet Take 1 tablet (100 mg total) by mouth daily. 06/02/22  Yes Corwin Levins, MD  metFORMIN (GLUCOPHAGE-XR) 500 MG 24 hr tablet Take 1 tablet by mouth twice daily 05/15/22  Yes Corwin Levins, MD  metoprolol succinate (TOPROL-XL) 25 MG 24 hr tablet 12.5 mg daily. 04/27/19  Yes [provider]  Multiple Vitamin (MULTIVITAMIN) capsule Take 1 capsule by mouth daily.   Yes [provider]  nitroGLYCERIN (NITROSTAT) 0.4 MG SL tablet 0.4 mg every 5 (five) minutes as needed for chest pain. 07/07/19  Yes [provider]  Omega-3 Fatty Acids (FISH OIL) 1000  MG CAPS Take by mouth 2 (two) times daily.   Yes [provider]  prasugrel (EFFIENT) 10 MG TABS tablet Take 10 mg by mouth daily. 04/27/19  Yes [provider]  VITAMIN D, CHOLECALCIFEROL, PO Take by mouth daily.   Yes [provider]  Blood Glucose Monitoring Suppl (ONETOUCH VERIO FLEX SYSTEM) w/Device KIT Use as directed three times per day E11.9 03/18/22   Corwin Levins, MD  ciclopirox [facility]) 8 % solution  02/19/22   [provider]  Diclofenac Sodium (PENNSAID) 2 % SOLN Pennsaid 20 mg/gram/actuation (2 %) topical soln in metered-dose pump  APPLY 2 PUMPS (40 MG) TO THE AFFECTED KNEE BY TOPICAL ROUTE 2 TIMES PER DAY Patient not taking: Reported on 11/09/2022    [provider]  furosemide (LASIX) 40 MG tablet Take 1 tablet (40 mg total) by mouth daily for 3 days. Patient not taking: Reported on 11/09/2022 11/07/22 11/10/22  Corwin Levins, MD  glucose blood (ACCU-CHEK GUIDE) test strip Use as instructed four time per day E11.9 11/07/22   Corwin Levins, MD  Lancets MISC Use as directed three times per day E11.9 03/18/22   Corwin Levins, MD  pantoprazole (PROTONIX) 40 MG tablet Take 2 tablets by mouth once daily Patient not taking: Reported on 11/09/2022 11/23/21   Corwin Levins, MD  triamcinolone (KENALOG) 0.025 % cream Apply topically. Patient not taking: Reported on 11/09/2022 04/25/19   [provider]    Physical Exam: Vitals:   11/09/22 1745 11/09/22 1800 11/09/22 1815 11/09/22 1917  BP: 124/72 104/61 119/67   Pulse: (!) 55 (!) 57 (!) 58   Resp: 20 20 15    Temp:    98.1 F (36.7 C)  TempSrc:    Oral  SpO2: 97% 95% 97%     Constitutional: NAD, calm  Eyes: PERTLA, lids and conjunctivae normal ENMT: Mucous membranes are moist. Posterior pharynx clear of any exudate or lesions.   Neck: supple, no masses  Respiratory: no wheezing, no crackles. No accessory muscle use.  Cardiovascular: S1 & S2 heard, regular rate and rhythm. No  extremity edema.    Abdomen: No distension, no tenderness, soft. Bowel sounds active.  Musculoskeletal: no clubbing / cyanosis. No joint deformity upper and lower extremities.   Skin: no significant rashes, lesions, ulcers. Warm, dry, well-perfused. Neurologic: CN 2-12 grossly intact. Moving all extremities. Alert and oriented.  Psychiatric: Pleasant. Cooperative.    Data Reviewed:   Labs, imaging, vitals, chart notes.   Family Communication: None present  Primary team communication: Discussed with Dr. Rhae Hammock  Thank you very much for involving [place] in the care of your patient.  Author: Briscoe Deutscher, MD 11/09/2022 7:44 PM  For on call review [email or web address].

## 2022-11-09 NOTE — ED Provider Notes (Addendum)
[place] EMERGENCY DEPARTMENT AT [facility] Provider Note   CSN: [id number] Arrival date & time: 11/09/22  1320     History  Chief Complaint  Patient presents with   Dizziness    Nathan Russo is a 82 y.o. male.  82 year old male with past medical history of hypertension, atrial fibrillation on Eliquis, and hypothyroidism as well as diabetes and coronary artery disease presenting to the emergency department today after an episode of lightheadedness.  The patient states that he has been in and out of the hospital over the past few weeks.  He states that he was actually feeling better.  He followed up with his doctor on Friday.  He had an x-ray performed and there was concern for potential pulmonary edema.  The patient was started on a 3-day course of Lasix.  The patient states he took the first dose yesterday.  He states he started feeling unwell afterwards and was having lightheadedness as well as some shortness of breath.  He states that he went to bed and woke up this morning.  He states he is feeling a little bit better.  He went with his wife to [facility] and while they were waiting he became very lightheaded and clammy.  He denies any associated chest pain.  They gave him some orange juice and he states that he started feeling a little better afterwards.  The patient states that over the past 2 weeks that he has been having some upper abdominal pain.  He states that he was also told that he was anemic by his doctor on outpatient testing.  The patient is on Eliquis.  He denies any blood in his stool or dark tarry stools.   Dizziness Associated symptoms: nausea        Home Medications Prior to Admission medications   Medication Sig Start Date End Date Taking? Authorizing Provider  amiodarone (PACERONE) 200 MG tablet Take by mouth. 05/17/19   [provider]  amLODipine (NORVASC) 2.5 MG tablet Take 1 tablet (2.5 mg total) by mouth daily. 06/16/22 06/16/23  Corwin Levins, MD  apixaban (ELIQUIS) 2.5 MG TABS tablet Take 1 tablet (2.5 mg total) by mouth 2 (two) times daily. Patient taking differently: Take 5 mg by mouth 2 (two) times daily. 01/12/20   Corwin Levins, MD  Ascorbic Acid (VITAMIN C) 1000 MG tablet Take 1,000 mg by mouth daily.    [provider]  augmented betamethasone dipropionate (DIPROLENE-AF) 0.05 % ointment betamethasone, augmented 0.05 % topical ointment  APPLY OINTMENT TOPICALLY TO EACH EAR AS NEEDED FOR ITCHING    [provider]  Blood Glucose Monitoring Suppl (ONETOUCH VERIO FLEX SYSTEM) w/Device KIT Use as directed three times per day E11.9 03/18/22   Corwin Levins, MD  Calcium Carb-Cholecalciferol [id number] MG-UNIT CAPS Take by mouth.    [provider]  ciclopirox (PENLAC) 8 % solution  02/19/22   [provider]  Cinnamon 500 MG capsule Take 500 mg by mouth 2 (two) times daily.    [provider]  cyanocobalamin 1000 MCG tablet Take by mouth.    [provider]  Diclofenac Sodium (PENNSAID) 2 % SOLN Pennsaid 20 mg/gram/actuation (2 %) topical soln in metered-dose pump  APPLY 2 PUMPS (40 MG) TO THE AFFECTED KNEE BY TOPICAL ROUTE 2 TIMES PER DAY    [provider]  ezetimibe (ZETIA) 10 MG tablet Take 1 tablet (10 mg total) by mouth daily. 05/15/22   Oliver Barre  W, MD  Flaxseed, Linseed, 1000 MG CAPS Take 1 capsule by mouth daily.    [provider]  furosemide (LASIX) 40 MG tablet Take 1 tablet (40 mg total) by mouth daily for 3 days. 11/07/22 11/10/22  Corwin Levins, MD  Garlic Oil 500 MG TABS Take by mouth 2 (two) times daily.    [provider]  Ginger, Zingiber officinalis, (GINGER PO) Take by mouth 4 (four) times daily.    [provider]  Glucosamine-Chondroit-Vit C-Mn (GLUCOSAMINE CHONDROITIN COMPLX) CAPS Take by mouth daily.    [provider]  Glucosamine-Chondroitin 250-200 MG TABS Take 1 tablet by mouth at bedtime. 04/25/19   [provider]  glucose blood (ACCU-CHEK GUIDE) test strip Use as instructed four time per day E11.9 11/07/22   Corwin Levins, MD  hydrocortisone 2.5 % ointment Apply topically 2 (two) times daily.    [provider]  iron polysaccharides (NU-IRON) 150 MG capsule Take 1 capsule (150 mg total) by mouth daily. 11/07/22   Corwin Levins, MD  isosorbide mononitrate (IMDUR) 60 MG 24 hr tablet Take by mouth. 08/30/19   [provider]  ketoconazole (NIZORAL) 2 % cream ketoconazole 2 % topical cream  APPLY CREAM TOPICALLY ONCE DAILY FOR 30 DAYS    [provider]  Lancets MISC Use as directed three times per day E11.9 03/18/22   Corwin Levins, MD  levothyroxine (SYNTHROID) 112 MCG tablet Take 1 tablet (112 mcg total) by mouth daily. 10/18/19   Corwin Levins, MD  losartan (COZAAR) 100 MG tablet Take 1 tablet (100 mg total) by mouth daily. 06/02/22   Corwin Levins, MD  metFORMIN (GLUCOPHAGE-XR) 500 MG 24 hr tablet Take 1 tablet by mouth twice daily 05/15/22   Corwin Levins, MD  metoprolol succinate (TOPROL-XL) 25 MG 24 hr tablet  04/27/19   [provider]  Multiple Vitamin (MULTIVITAMIN) capsule Take 1 capsule by mouth daily.    [provider]  nitroGLYCERIN (NITROSTAT) 0.4 MG SL tablet  07/07/19   [provider]  Omega-3 Fatty Acids (FISH OIL) 1000 MG CAPS Take by mouth 2 (two) times daily.    [provider]  pantoprazole (PROTONIX) 40 MG tablet Take 2 tablets by mouth once daily 11/23/21   Corwin Levins, MD  prasugrel (EFFIENT) 10 MG TABS tablet Take 10 mg by mouth daily. 04/27/19   [provider]  triamcinolone (KENALOG) 0.025 % cream Apply topically. 04/25/19   [provider]  VITAMIN D, CHOLECALCIFEROL, PO Take by mouth daily.    [provider]      Allergies    Diphenoxylate-atropine, Other, Sulfa antibiotics, Ace inhibitors, Atenolol, Codeine, Levaquin [levofloxacin], Lovastatin, Morphine, Morphine and codeine,  Nsaids, Statins, Sulfamethoxazole, Sulfasalazine, and Tramadol    Review of Systems   Review of Systems  Gastrointestinal:  Positive for abdominal pain and nausea.  Neurological:  Positive for dizziness.  All other systems reviewed and are negative.   Physical Exam Updated Vital Signs BP (!) 146/75   Pulse (!) 58   Temp 98.2 F (36.8 C) (Oral)   Resp 16   SpO2 97%  Physical Exam Vitals and nursing note reviewed.   Gen: NAD Eyes: PERRL, EOMI HEENT: no oropharyngeal swelling Neck: trachea midline Resp: clear to auscultation bilaterally Card: Bradycardic, regular rhythm, no murmurs, rubs, or gallops Abd: Patient is tender over the epigastrium with no guarding or rebound Extremities: no calf tenderness, no edema Vascular: 2+ radial pulses  bilaterally, 2+ DP pulses bilaterally Skin: no rashes Psyc: acting appropriately   ED Results / Procedures / Treatments   Labs (all labs ordered are listed, but only abnormal results are displayed) Labs Reviewed  BASIC METABOLIC PANEL - Abnormal; Notable for the following components:      Result Value   Glucose, Bld 156 (*)    BUN 34 (*)    Creatinine, Ser 1.67 (*)    GFR, Estimated 40 (*)    All other components within normal limits  CBC WITH DIFFERENTIAL/PLATELET - Abnormal; Notable for the following components:   RBC 4.16 (*)    Hemoglobin 11.1 (*)    HCT 36.1 (*)    RDW 15.8 (*)    Abs Immature Granulocytes 0.10 (*)    All other components within normal limits  TSH - Abnormal; Notable for the following components:   TSH 8.272 (*)    All other components within normal limits  CBG MONITORING, ED - Abnormal; Notable for the following components:   Glucose-Capillary 150 (*)    All other components within normal limits  HEPATIC FUNCTION PANEL  LIPASE, BLOOD  TROPONIN I (HIGH SENSITIVITY)    EKG EKG Interpretation Date/Time:  Sunday November 09 2022 13:46:33 EDT Ventricular Rate:  52 PR Interval:  262 QRS  Duration:  98 QT Interval:  535 QTC Calculation: 498 R Axis:   -51  Text Interpretation: Sinus rhythm Prolonged PR interval Anterolateral infarct, old Confirmed by Beckey Downing [phone]) on 11/09/2022 2:24:30 PM  Radiology CT ABDOMEN PELVIS WO CONTRAST  Result Date: 11/09/2022 CLINICAL DATA:  Recurrent infections. Abdominal mass. Intra-abdominal neoplasm suspected. EXAM: CT ABDOMEN AND PELVIS WITHOUT CONTRAST TECHNIQUE: Multidetector CT imaging of the abdomen and pelvis was performed following the standard protocol without IV contrast. RADIATION DOSE REDUCTION: This exam was performed according to the departmental dose-optimization program which includes automated exposure control, adjustment of the mA and/or kV according to patient size and/or use of iterative reconstruction technique. COMPARISON:  10/06/2022 CTs from [facility] FINDINGS: Lower chest: Bilateral pulmonary nodules. Index right lower lobe 9 mm nodule on 22/6 measured 7 mm on 10/06/2022 (when remeasured). More medial and cephalad 7 mm right lower lobe nodule on 04/06 measured 6 mm on the prior. Normal heart size without pericardial or pleural effusion. Multivessel coronary artery atherosclerosis. Hepatobiliary: Normal liver. Cholecystectomy, without biliary ductal dilatation. Pancreas: Normal, without mass or ductal dilatation. Spleen: Old granulomatous disease within Adrenals/Urinary Tract: Normal adrenal glands. Mild right renal cortical thinning. Subcentimeter interpolar left renal low-density lesion is likely a cyst . In the absence of clinically indicated signs/symptoms require(s) no independent follow-up. No renal calculi or hydronephrosis. No hydroureter or ureteric calculi. No bladder calculi. Stomach/Bowel: Normal stomach, without wall thickening. Extensive colonic diverticulosis. Colonic stool burden suggests constipation. Normal terminal ileum and appendix. Normal small bowel. Vascular/Lymphatic: Aortic atherosclerosis. No  abdominopelvic adenopathy. Reproductive: Mild prostatomegaly. Other: No significant free fluid. No free intraperitoneal air. Tiny fat containing bilateral inguinal hernias. Musculoskeletal: Minimal convex left lumbar spine curvature. Lumbar spondylosis. IMPRESSION: Since the [facility] CT of 10/06/2022, further enlargement of bibasilar pulmonary nodules, suspicious for metastatic disease. Recommend multidisciplinary thoracic oncology consultation. No acute process or primary malignancy identified within the abdomen or pelvis. Decreased sensitivity secondary to lack of oral or IV contrast. Incidental findings, including: Coronary artery atherosclerosis. Aortic Atherosclerosis (ICD10-I70.0). Possible constipation. Prostatomegaly. Electronically Signed   By: Jeronimo Greaves M.D.   On: 11/09/2022 16:21    Procedures Procedures    Medications  Ordered in ED Medications  levothyroxine (SYNTHROID) tablet 125 mcg (has no administration in time range)    ED Course/ Medical Decision Making/ A&P                                 Medical Decision Making 82 year old male with past medical history of coronary artery disease, hypothyroidism, atrial fibrillation on Eliquis, and diabetes presenting to the emergency department today after episodic lightheadedness.  I will further evaluate the patient here with basic labs well as an EKG, chest x-ray, and troponin for further evaluation for atypical ACS, pulmonary edema, pulmonary infiltrates, pneumothorax.  Also obtain LFTs and a lipase to evaluate for hepatobiliary pathology or pancreatitis.  Obtain a CT scan of his abdomen to evaluate for mass lesion, diverticulitis, colitis, or other intra-abdominal pathology.  Will also obtain a TSH to evaluate thyroid function.  With symptoms improving with the orange juice this may have been due to hypoglycemia as well.  The patient's EKG interpreted by me shows a sinus bradycardia with rate of 51 with left axis deviation,  normal intervals with exception of borderline QT prolongation at 497 and nonspecific ST-T changes.  The patient's labs are largely reassuring.  His troponin was negative here.  His CT scan of his abdomen was negative but did show some concerning lesions in the lungs concerning for possible metastatic lesions.  The patient's initial QTc was borderline.  A repeat EKG shows a sinus rhythm with a rate of 56 with left axis deviation and a QTc of 556 with nonspecific ST-T changes.  Given the prolonged QT and has lightheadedness earlier calls placed to hospitalist service for admission for further telemetry.  The patient was evaluated by Dr. Antionette Char.  He found out that he was good to be an observation admission and then requested to be discharged.  He would like to follow-up with his cardiologist as an outpatient.  I did discuss my concerns patient does have decision-making capacity.  He is ultimately discharged through shared decision making.  He is given a small fluid bolus prior to discharge.  Amount and/or Complexity of Data Reviewed Labs: ordered. Radiology: ordered. ECG/medicine tests: ordered.  Risk Decision regarding hospitalization.          Final Clinical Impression(s) / ED Diagnoses Final diagnoses:  Dizziness  Prolonged QT interval  Disposition: discharge  Rx / DC Orders ED Discharge Orders     None         Durwin Glaze, MD 11/09/22 Carney Harder, MD 11/09/22 [phone]

## 2022-11-11 ENCOUNTER — Ambulatory Visit (INDEPENDENT_AMBULATORY_CARE_PROVIDER_SITE_OTHER): Payer: Medicare (Managed Care) | Admitting: Internal Medicine

## 2022-11-11 ENCOUNTER — Encounter: Payer: Self-pay | Admitting: Internal Medicine

## 2022-11-11 VITALS — BP 126/82 | HR 55 | Temp 98.1°F | Ht 72.0 in | Wt 207.0 lb

## 2022-11-11 DIAGNOSIS — K5900 Constipation, unspecified: Secondary | ICD-10-CM | POA: Diagnosis not present

## 2022-11-11 DIAGNOSIS — D509 Iron deficiency anemia, unspecified: Secondary | ICD-10-CM | POA: Insufficient documentation

## 2022-11-11 DIAGNOSIS — N1832 Chronic kidney disease, stage 3b: Secondary | ICD-10-CM | POA: Diagnosis not present

## 2022-11-11 DIAGNOSIS — R918 Other nonspecific abnormal finding of lung field: Secondary | ICD-10-CM | POA: Insufficient documentation

## 2022-11-11 DIAGNOSIS — I951 Orthostatic hypotension: Secondary | ICD-10-CM

## 2022-11-11 NOTE — Assessment & Plan Note (Signed)
Recent onset, etiology unclear, also with reported upper abd pain, for GI referral unless he is able to access more quickly at [facility]

## 2022-11-11 NOTE — Patient Instructions (Signed)
Please continue all other medications as before, and refills have been done if requested.  Please have the pharmacy call with any other refills you may need.  Please continue your efforts at being more active, low cholesterol diet, and weight control.  Please keep your appointments with your specialists as you may have planned - ENT at [facility] soon  You will be contacted regarding the referral for: GI   Ok to try the Miralax for constipation up to twice per day  Let me know if you would want the Chest CT ordered  Please make an Appointment to return in 3 months, or sooner if you already have an appt

## 2022-11-11 NOTE — Assessment & Plan Note (Signed)
With near syncope, tx with IVF, no further lasix, ECG in ED noted with QT prolongation, pt has been in phone contact with cardiology and has plan to repeat ECG thurs sept 5, as well as cardiology soon

## 2022-11-11 NOTE — Progress Notes (Signed)
Patient ID: Nathan Russo, male   DOB: 1941/02/19, 82 y.o.   MRN: [id number]        Chief Complaint: follow up post ED visit sept 1 after near syncope       HPI:  Nathan Russo is a 82 y.o. male here with c/o above, found to be orthostatic, tx with IVF and pt requested d/c home,  also mentioned upper abd pain and CT abd pelvis with noted constipation, but also incidental pulm nodules in bilateral lower lung fields of uncertain significance.  Pt also with recent finding low ferritin level and mild worsening Hemoglobin but no overt bleeding.  Pt notes he has been in phone contact with his [facility] Dr Mary Sella office and has f/u in approx 2 wks, and believes he could access pulmonary at [facility] as well, declines CT chest no contrast here for now.  Pt denies chest pain, increased sob or doe, wheezing, orthopnea, PND, increased LE swelling, palpitations,   Pt denies polydipsia, polyuria, or new focal neuro s/s.    Pt denies fever, night sweats, loss of appetite, or other constitutional symptoms, though has some wt loss recent for unclear reasons.         Wt Readings from Last 3 Encounters:  11/11/22 207 lb (93.9 kg)  11/06/22 207 lb (93.9 kg)  10/30/22 212 lb (96.2 kg)   BP Readings from Last 3 Encounters:  11/11/22 126/82  11/09/22 135/73  11/06/22 126/72         Past Medical History:  Diagnosis Date   Abdominal pain, epigastric 04/11/2010   BELCHING 04/23/2010   BRADYCARDIA, CHRONIC 10/24/2006   CARPAL TUNNEL SYNDROME, BILATERAL 02/07/2009   CHEST PAIN-UNSPECIFIED 09/05/2008   CHOLELITHIASIS 04/22/2010   Cholelithiasis 06/13/2010   COLONIC POLYPS, HX OF 04/28/2007   DEGENERATIVE JOINT DISEASE, RIGHT KNEE 10/24/2006   Depression 02/23/2011   DIABETES MELLITUS, TYPE II 04/28/2007   Dizziness and giddiness 12/19/2009   Elevated PSA 06/13/2010   FATIGUE 04/28/2007   GERD 02/11/2009   Headache(784.0) 12/19/2009   HYPERLIPIDEMIA 04/28/2007   HYPERTENSION 10/21/2006   NECK MASS 02/07/2010   OBESITY 10/24/2006    OTITIS MEDIA, ACUTE, LEFT 12/26/2009   PHIMOSIS 02/07/2009   S/P laparoscopic cholecystectomy 10/31/2010   Thyroid cancer (HCC) 06/13/2010   THYROID NODULE 02/07/2010   Past Surgical History:  Procedure Laterality Date   CHOLECYSTECTOMY     left knee surgery     right wrist surgury     THYROID SURGERY      reports that he has quit smoking. He has never used smokeless tobacco. He reports that he does not drink alcohol and does not use drugs. family history includes Arthritis in his mother; Cancer in his brother and sister; Colon cancer (age of onset: 53) in his brother; Diabetes in his brother; Goiter in his mother; Heart attack (age of onset: 63) in his brother; Heart attack (age of onset: 22) in his father; Hypertension in his mother; Hypothyroidism in his sister. Allergies  Allergen Reactions   Diphenoxylate-Atropine Other (See Comments)   Other Other (See Comments) and Hives    Causes body to ache Causes body to ache   Sulfa Antibiotics Other (See Comments)    As child almost died   Ace Inhibitors     REACTION: cough   Atenolol     REACTION: bradycardia   Codeine Other (See Comments)    Head spins "wild"   Levaquin [Levofloxacin]     Interferes with flecainide  Lovastatin     REACTION: myalygros   Morphine Nausea And Vomiting   Morphine And Codeine Other (See Comments)   Nsaids     Heart patient   Statins Other (See Comments)    Causes body to ache   Sulfamethoxazole Other (See Comments)    Childhood unknown reaction   Sulfasalazine Other (See Comments)    As child almost died   Tramadol     Ants crawling all over   Current Outpatient Medications on File Prior to Visit  Medication Sig Dispense Refill   amiodarone (PACERONE) 200 MG tablet Take by mouth.     amLODipine (NORVASC) 2.5 MG tablet Take 1 tablet (2.5 mg total) by mouth daily. (Patient taking differently: Take 2.5 mg by mouth at bedtime.) 90 tablet 3   apixaban (ELIQUIS) 5 MG TABS tablet Take 2.5 mg by mouth  2 (two) times daily.     Ascorbic Acid (VITAMIN C) 1000 MG tablet Take 1,000 mg by mouth daily.     augmented betamethasone dipropionate (DIPROLENE-AF) 0.05 % ointment betamethasone, augmented 0.05 % topical ointment  APPLY OINTMENT TOPICALLY TO EACH EAR AS NEEDED FOR ITCHING     Blood Glucose Monitoring Suppl (ONETOUCH VERIO FLEX SYSTEM) w/Device KIT Use as directed three times per day E11.9 1 kit 0   Calcium Carb-Cholecalciferol 508 319 9819 MG-UNIT CAPS Take by mouth.     ciclopirox (PENLAC) 8 % solution      Cinnamon 500 MG capsule Take 500 mg by mouth 2 (two) times daily.     cyanocobalamin 1000 MCG tablet Take 1,000 mcg by mouth daily.     Diclofenac Sodium (PENNSAID) 2 % SOLN      ezetimibe (ZETIA) 10 MG tablet Take 1 tablet (10 mg total) by mouth daily. 90 tablet 3   Flaxseed, Linseed, 1000 MG CAPS Take 1 capsule by mouth daily.     Garlic Oil 500 MG TABS Take by mouth 2 (two) times daily.     Ginger, Zingiber officinalis, (GINGER PO) Take 1 tablet by mouth in the morning and at bedtime.     Glucosamine-Chondroit-Vit C-Mn (GLUCOSAMINE CHONDROITIN COMPLX) CAPS Take by mouth daily.     Glucosamine-Chondroitin 250-200 MG TABS Take 1 tablet by mouth at bedtime.     glucose blood (ACCU-CHEK GUIDE) test strip Use as instructed four time per day E11.9 400 each 12   hydrocortisone 2.5 % ointment Apply 1 Application topically 2 (two) times daily.     iron polysaccharides (NU-IRON) 150 MG capsule Take 1 capsule (150 mg total) by mouth daily. 90 capsule 1   isosorbide mononitrate (IMDUR) 60 MG 24 hr tablet Take by mouth.     ketoconazole (NIZORAL) 2 % cream ketoconazole 2 % topical cream  APPLY CREAM TOPICALLY ONCE DAILY FOR 30 DAYS     Lancets MISC Use as directed three times per day E11.9 300 each 3   levothyroxine (SYNTHROID) 112 MCG tablet Take 1 tablet (112 mcg total) by mouth daily. 90 tablet 3   losartan (COZAAR) 100 MG tablet Take 1 tablet (100 mg total) by mouth daily. 90 tablet 3    metFORMIN (GLUCOPHAGE-XR) 500 MG 24 hr tablet Take 1 tablet by mouth twice daily 180 tablet 3   metoprolol succinate (TOPROL-XL) 25 MG 24 hr tablet 12.5 mg daily.     Multiple Vitamin (MULTIVITAMIN) capsule Take 1 capsule by mouth daily.     nitroGLYCERIN (NITROSTAT) 0.4 MG SL tablet 0.4 mg every 5 (five) minutes as needed for chest pain.  Omega-3 Fatty Acids (FISH OIL) 1000 MG CAPS Take by mouth 2 (two) times daily.     pantoprazole (PROTONIX) 40 MG tablet Take 2 tablets by mouth once daily 180 tablet 3   prasugrel (EFFIENT) 10 MG TABS tablet Take 10 mg by mouth daily.     triamcinolone (KENALOG) 0.025 % cream Apply topically.     VITAMIN D, CHOLECALCIFEROL, PO Take by mouth daily.     furosemide (LASIX) 40 MG tablet Take 1 tablet (40 mg total) by mouth daily for 3 days. (Patient not taking: Reported on 11/09/2022) 3 tablet 0   No current facility-administered medications on file prior to visit.        ROS:  All others reviewed and negative.  Objective        PE:  BP 126/82 (BP Location: Left Arm, Patient Position: Sitting, Cuff Size: Normal)   Pulse (!) 55   Temp 98.1 F (36.7 C) (Oral)   Ht 6' (1.829 m)   Wt 207 lb (93.9 kg)   SpO2 98%   BMI 28.07 kg/m                 Constitutional: Pt appears in NAD               HENT: Head: NCAT.                Right Ear: External ear normal.                 Left Ear: External ear normal.                Eyes: . Pupils are equal, round, and reactive to light. Conjunctivae and EOM are normal               Nose: without d/c or deformity               Neck: Neck supple. Gross normal ROM               Cardiovascular: Normal rate and regular rhythm.                 Pulmonary/Chest: Effort normal and breath sounds without rales or wheezing.                Abd:  Soft, NT, ND, + BS, no organomegaly               Neurological: Pt is alert. At baseline orientation, motor grossly intact               Skin: Skin is warm. No rashes, no other new  lesions, LE edema - none               Psychiatric: Pt behavior is normal without agitation   Micro: none  Cardiac tracings I have personally interpreted today:  none  Pertinent Radiological findings (summarize): none   Lab Results  Component Value Date   WBC 7.8 11/09/2022   HGB 11.1 (L) 11/09/2022   HCT 36.1 (L) 11/09/2022   PLT 299 11/09/2022   GLUCOSE 156 (H) 11/09/2022   CHOL 136 10/30/2022   TRIG 173.0 (H) 10/30/2022   HDL 40.00 10/30/2022   LDLDIRECT 80.0 03/18/2022   LDLCALC 61 10/30/2022   ALT 21 11/09/2022   AST 23 11/09/2022   NA 136 11/09/2022   K 3.9 11/09/2022   CL 103 11/09/2022   CREATININE 1.67 (H) 11/09/2022   BUN 34 (H) 11/09/2022   CO2 23 11/09/2022  TSH 8.272 (H) 11/09/2022   PSA 3.59 05/08/2017   INR 1.0 08/22/2008   HGBA1C 6.4 10/30/2022   MICROALBUR 1.8 09/24/2022   Assessment/Plan:  Nathan Russo is a 82 y.o. White or Caucasian [1] male with  has a past medical history of Abdominal pain, epigastric (04/11/2010), BELCHING (04/23/2010), BRADYCARDIA, CHRONIC (10/24/2006), CARPAL TUNNEL SYNDROME, BILATERAL (02/07/2009), CHEST PAIN-UNSPECIFIED (09/05/2008), CHOLELITHIASIS (04/22/2010), Cholelithiasis (06/13/2010), COLONIC POLYPS, HX OF (04/28/2007), DEGENERATIVE JOINT DISEASE, RIGHT KNEE (10/24/2006), Depression (02/23/2011), DIABETES MELLITUS, TYPE II (04/28/2007), Dizziness and giddiness (12/19/2009), Elevated PSA (06/13/2010), FATIGUE (04/28/2007), GERD (02/11/2009), Headache(784.0) (12/19/2009), HYPERLIPIDEMIA (04/28/2007), HYPERTENSION (10/21/2006), NECK MASS (02/07/2010), OBESITY (10/24/2006), OTITIS MEDIA, ACUTE, LEFT (12/26/2009), PHIMOSIS (02/07/2009), S/P laparoscopic cholecystectomy (10/31/2010), Thyroid cancer (HCC) (06/13/2010), and THYROID NODULE (02/07/2010).  Orthostatic syncope With near syncope, tx with IVF, no further lasix, ECG in ED noted with QT prolongation, pt has been in phone contact with cardiology and has plan to repeat ECG thurs sept 5, as well as  cardiology soon  Multiple pulmonary nodules Incidental finding, declines Ct chest for now, plans to f/u with ENT [facility] then likely pulmonary [facility] as well with CT  Iron deficiency anemia Recent onset, etiology unclear, also with reported upper abd pain, for GI referral unless he is able to access more quickly at [facility]  Constipation Recent noted on CT - for miralax bid prn, and colace bid prn,  to f/u any worsening symptoms or concerns  CKD stage 3b, GFR 30-44 ml/min (HCC) Lab Results  Component Value Date   CREATININE 1.67 (H) 11/09/2022   Stable overall, cont to avoid nephrotoxins  Followup: Return in about 3 months (around 02/10/2023).  Oliver Barre, MD 11/11/2022 8:57 PM [facility]

## 2022-11-11 NOTE — Assessment & Plan Note (Signed)
Lab Results  Component Value Date   CREATININE 1.67 (H) 11/09/2022   Stable overall, cont to avoid nephrotoxins

## 2022-11-11 NOTE — Assessment & Plan Note (Signed)
Recent noted on CT - for miralax bid prn, and colace bid prn,  to f/u any worsening symptoms or concerns

## 2022-11-11 NOTE — Assessment & Plan Note (Signed)
Incidental finding, declines Ct chest for now, plans to f/u with ENT [facility] then likely pulmonary [facility] as well with CT

## 2022-11-12 ENCOUNTER — Other Ambulatory Visit (HOSPITAL_COMMUNITY): Payer: Self-pay

## 2022-11-12 ENCOUNTER — Telehealth: Payer: Self-pay | Admitting: Internal Medicine

## 2022-11-12 NOTE — Telephone Encounter (Signed)
A representative from [employer] called and asked for a prior authorization for glucose blood (ACCU-CHEK GUIDE) test strip. They said it is [employer] Medicare Part B. Phone number is [phone]. Fax number is [phone].

## 2022-11-13 ENCOUNTER — Ambulatory Visit (INDEPENDENT_AMBULATORY_CARE_PROVIDER_SITE_OTHER): Payer: Medicare (Managed Care) | Admitting: Physician Assistant

## 2022-11-13 ENCOUNTER — Encounter: Payer: Self-pay | Admitting: Physician Assistant

## 2022-11-13 VITALS — BP 120/70 | HR 49 | Ht 72.0 in | Wt 204.0 lb

## 2022-11-13 DIAGNOSIS — R1013 Epigastric pain: Secondary | ICD-10-CM

## 2022-11-13 DIAGNOSIS — Z7901 Long term (current) use of anticoagulants: Secondary | ICD-10-CM | POA: Diagnosis not present

## 2022-11-13 DIAGNOSIS — D509 Iron deficiency anemia, unspecified: Secondary | ICD-10-CM

## 2022-11-13 DIAGNOSIS — R195 Other fecal abnormalities: Secondary | ICD-10-CM

## 2022-11-13 DIAGNOSIS — R194 Change in bowel habit: Secondary | ICD-10-CM

## 2022-11-13 DIAGNOSIS — J984 Other disorders of lung: Secondary | ICD-10-CM | POA: Diagnosis not present

## 2022-11-13 DIAGNOSIS — I4719 Other supraventricular tachycardia: Secondary | ICD-10-CM | POA: Diagnosis not present

## 2022-11-13 DIAGNOSIS — I440 Atrioventricular block, first degree: Secondary | ICD-10-CM | POA: Diagnosis not present

## 2022-11-13 MED ORDER — NA SULFATE-K SULFATE-MG SULF 17.5-3.13-1.6 GM/177ML PO SOLN: 1.0000 | Freq: Once | ORAL | 0 refills | Status: AC

## 2022-11-13 MED ORDER — PANTOPRAZOLE SODIUM 40 MG PO TBEC: 40.0000 mg | DELAYED_RELEASE_TABLET | Freq: Two times a day (BID) | ORAL | 5 refills | Status: DC

## 2022-11-13 NOTE — Addendum Note (Signed)
Addended by: Alberteen Sam E on: 11/13/2022 11:09 AM   Modules accepted: Orders

## 2022-11-13 NOTE — Patient Instructions (Addendum)
_______________________________________________________  If your blood pressure at your visit was 140/90 or greater, please contact your primary care physician to follow up on this.  _______________________________________________________  If you are age 84 or older, your body mass index should be between 23-30. Your Body mass index is 27.67 kg/m. If this is out of the aforementioned range listed, please consider follow up with your Primary Care Provider.  If you are age 40 or younger, your body mass index should be between 19-25. Your Body mass index is 27.67 kg/m. If this is out of the aformentioned range listed, please consider follow up with your Primary Care Provider.   ________________________________________________________  The [place] [facility] providers would like to encourage you to use [facility] to communicate with providers for non-urgent requests or questions.  Due to long hold times on the telephone, sending your provider a message by [facility] may be a faster and more efficient way to get a response.  Please allow 48 business hours for a response.  Please remember that this is for non-urgent requests.  _______________________________________________________  Please purchase the following medications over the counter and take as directed: Miralax 17g daily  Continue iron supplement  We have sent the following medications to your pharmacy for you to pick up at your convenience: Protonix 40mg  2 times a day Suprep  You will be contacted by our office prior to your procedure for directions on holding your blood thinners.  If you do not hear from our office 2 week prior to your scheduled procedure, please call [phone] to discuss.   You have been scheduled for an endoscopy and colonoscopy. Please follow the written instructions given to you at your visit today.  Please pick up your prep supplies at the pharmacy within the next 1-3 days.  If you use inhalers (even only as needed),  please bring them with you on the day of your procedure.  DO NOT TAKE 7 DAYS PRIOR TO TEST- Trulicity (dulaglutide) Ozempic, Wegovy (semaglutide) Mounjaro (tirzepatide) Bydureon Bcise (exanatide extended release)  DO NOT TAKE 1 DAY PRIOR TO YOUR TEST Rybelsus (semaglutide) Adlyxin (lixisenatide) Victoza (liraglutide) Byetta (exanatide) ___________________________________________________________________________  Thank you,  Dr. Lynann Bologna

## 2022-11-13 NOTE — Progress Notes (Signed)
Agree with the assessment and plan as outlined by Hyacinth Meeker, PA-C.  Medically complex 82 year old individual who requires EGD/colonoscopy for evaluation of iron deficiency anemia along with concerns for metastatic pulmonary nodules on recent CT.  Chart reviewed, and while he does not have any single criteria that requires his case to be done at the hospital, in totality, based on his multiple comorbidities and advanced age I feel this would be more appropriate to be done in the hospital setting.  Dottie, assuming he gets Cardiology clearance, can we schedule him for expedited EGD/colonoscopy with me next week at [facility] or [facility]?  [place], DO, [facility]

## 2022-11-13 NOTE — Addendum Note (Signed)
Addended by: Richardson Chiquito on: 11/13/2022 03:04 PM   Modules accepted: Orders

## 2022-11-13 NOTE — Progress Notes (Signed)
Chief Complaint: Anemia and black stool  HPI:    Nathan Russo is a 82 year old male with a past medical history as listed below including chronic bradycardia, CAD status post stenting Eliquis and Effient, A-fib status post ablation with focal atrial tachycardia, GERD, obesity, status post laparoscopic cholecystectomy and multiple others, who was referred to me by Corwin Levins, MD for a complaint of anemia and black stool.      08/05/2011 colonoscopy with Dr. Dickie La with moderate diverticulosis and otherwise normal.  Repeat recommended in 5 years.    11/07/2022 iron studies with a percent saturation low at 13.9, iron 45, ferritin decreased at 18.7.  Patient started on iron supplementation.    11/09/2022 CBC with a hemoglobin of 11.1 (14 3 years ago, 12.5 a year ago), MCV normal.  Platelets normal.  BMP with BUN slightly elevated from baseline of around 28 29-34 over the past couple of weeks, creatinine 1.67.    11/09/2022 CT of the abdomen pelvis without contrast with further enlargement of bibasilar pulmonary nodes since 10/06/2022 suspicious for metastatic disease.  Recommended multidisciplinary thoracic oncology consultation.  There was no acute process or primary malignancy identified within the abdomen or pelvis.  There was decrease sensitivity secondary to lack of oral or IV contrast.    11/09/2022 patient seen in the ER for dizziness.  CBC with a hemoglobin of 11.1, creatinine 1.67, BUN 34.  TSH 8.272.  Glucose 150.  EKG with prolonged PR interval.    Today, patient presents to clinic and describes that he was recently told that he was iron deficient and started an iron supplement, but even prior to this over the past 3 to 4 weeks he has been experiencing epigastric pain/discomfort which was constant in nature as well as seeing some tarry black stools a couple of times over the past 3 to 4 weeks.  Tells me he is on Pantoprazole 40 mg once daily, but continues with abdominal pain.  He is on an iron supplement  now but just over the past week.    Also complains of a change in bowel habits towards constipation over the past month or so, prior to this was completely normal, now having issues with small balls and not going very frequently.  His PCP told him to start MiraLAX his first dose he took today.    In regards to patient's abnormal lymph nodes on CT apparently he has history of prior cancer around his throat which was taken care of, he followed with his oncologist about abnormal imaging and they are not worried as these nodules in his lungs are slow-growing, but they did move up his appointment to next month to follow with him for further scans.    As far as patient's cardiac history he had a heart attack 2 years ago now, he has had some issues given that they ablated his A-fib and he still has atrial tachycardia at times.  Apparently also had an abnormal EKG when he was recently in the hospital so he is following with cardiology later today.    Patient is a retired Education officer, environmental and does take care of his wife who has a broken pelvis at home.    Denies fever, chills, weight loss, nausea, vomiting or symptoms that awaken him from sleep.  Past Medical History:  Diagnosis Date   Abdominal pain, epigastric 04/11/2010   BELCHING 04/23/2010   BRADYCARDIA, CHRONIC 10/24/2006   CARPAL TUNNEL SYNDROME, BILATERAL 02/07/2009   CHEST PAIN-UNSPECIFIED 09/05/2008  CHOLELITHIASIS 04/22/2010   Cholelithiasis 06/13/2010   COLONIC POLYPS, HX OF 04/28/2007   DEGENERATIVE JOINT DISEASE, RIGHT KNEE 10/24/2006   Depression 02/23/2011   DIABETES MELLITUS, TYPE II 04/28/2007   Dizziness and giddiness 12/19/2009   Elevated PSA 06/13/2010   FATIGUE 04/28/2007   GERD 02/11/2009   Headache(784.0) 12/19/2009   HYPERLIPIDEMIA 04/28/2007   HYPERTENSION 10/21/2006   NECK MASS 02/07/2010   OBESITY 10/24/2006   OTITIS MEDIA, ACUTE, LEFT 12/26/2009   PHIMOSIS 02/07/2009   S/P laparoscopic cholecystectomy 10/31/2010   Thyroid cancer (HCC) 06/13/2010    THYROID NODULE 02/07/2010    Past Surgical History:  Procedure Laterality Date   CHOLECYSTECTOMY     left knee surgery     right wrist surgury     THYROID SURGERY      Current Outpatient Medications  Medication Sig Dispense Refill   amiodarone (PACERONE) 200 MG tablet Take by mouth.     amLODipine (NORVASC) 2.5 MG tablet Take 1 tablet (2.5 mg total) by mouth daily. (Patient taking differently: Take 2.5 mg by mouth at bedtime.) 90 tablet 3   apixaban (ELIQUIS) 5 MG TABS tablet Take 2.5 mg by mouth 2 (two) times daily.     Ascorbic Acid (VITAMIN C) 1000 MG tablet Take 1,000 mg by mouth daily.     augmented betamethasone dipropionate (DIPROLENE-AF) 0.05 % ointment betamethasone, augmented 0.05 % topical ointment  APPLY OINTMENT TOPICALLY TO EACH EAR AS NEEDED FOR ITCHING     Blood Glucose Monitoring Suppl (ONETOUCH VERIO FLEX SYSTEM) w/Device KIT Use as directed three times per day E11.9 1 kit 0   Calcium Carb-Cholecalciferol [phone] MG-UNIT CAPS Take by mouth.     ciclopirox (PENLAC) 8 % solution      Cinnamon 500 MG capsule Take 500 mg by mouth 2 (two) times daily.     cyanocobalamin 1000 MCG tablet Take 1,000 mcg by mouth daily.     Diclofenac Sodium (PENNSAID) 2 % SOLN      ezetimibe (ZETIA) 10 MG tablet Take 1 tablet (10 mg total) by mouth daily. 90 tablet 3   Flaxseed, Linseed, 1000 MG CAPS Take 1 capsule by mouth daily.     furosemide (LASIX) 40 MG tablet Take 1 tablet (40 mg total) by mouth daily for 3 days. (Patient not taking: Reported on 11/09/2022) 3 tablet 0   Garlic Oil 500 MG TABS Take by mouth 2 (two) times daily.     Ginger, Zingiber officinalis, (GINGER PO) Take 1 tablet by mouth in the morning and at bedtime.     Glucosamine-Chondroit-Vit C-Mn (GLUCOSAMINE CHONDROITIN COMPLX) CAPS Take by mouth daily.     Glucosamine-Chondroitin 250-200 MG TABS Take 1 tablet by mouth at bedtime.     glucose blood (ACCU-CHEK GUIDE) test strip Use as instructed four time per day E11.9 400  each 12   hydrocortisone 2.5 % ointment Apply 1 Application topically 2 (two) times daily.     iron polysaccharides (NU-IRON) 150 MG capsule Take 1 capsule (150 mg total) by mouth daily. 90 capsule 1   isosorbide mononitrate (IMDUR) 60 MG 24 hr tablet Take by mouth.     ketoconazole (NIZORAL) 2 % cream ketoconazole 2 % topical cream  APPLY CREAM TOPICALLY ONCE DAILY FOR 30 DAYS     Lancets MISC Use as directed three times per day E11.9 300 each 3   levothyroxine (SYNTHROID) 112 MCG tablet Take 1 tablet (112 mcg total) by mouth daily. 90 tablet 3   losartan (COZAAR) 100 MG  tablet Take 1 tablet (100 mg total) by mouth daily. 90 tablet 3   metFORMIN (GLUCOPHAGE-XR) 500 MG 24 hr tablet Take 1 tablet by mouth twice daily 180 tablet 3   metoprolol succinate (TOPROL-XL) 25 MG 24 hr tablet 12.5 mg daily.     Multiple Vitamin (MULTIVITAMIN) capsule Take 1 capsule by mouth daily.     nitroGLYCERIN (NITROSTAT) 0.4 MG SL tablet 0.4 mg every 5 (five) minutes as needed for chest pain.     Omega-3 Fatty Acids (FISH OIL) 1000 MG CAPS Take by mouth 2 (two) times daily.     pantoprazole (PROTONIX) 40 MG tablet Take 2 tablets by mouth once daily 180 tablet 3   prasugrel (EFFIENT) 10 MG TABS tablet Take 10 mg by mouth daily.     triamcinolone (KENALOG) 0.025 % cream Apply topically.     VITAMIN D, CHOLECALCIFEROL, PO Take by mouth daily.     No current facility-administered medications for this visit.    Allergies as of 11/13/2022 - Review Complete 11/11/2022  Allergen Reaction Noted   Diphenoxylate-atropine Other (See Comments) 09/24/2017   Other Other (See Comments) and Hives 05/06/2016   Sulfa antibiotics Other (See Comments) 06/26/2011   Ace inhibitors     Atenolol     Codeine Other (See Comments)    Levaquin [levofloxacin]  05/21/2016   Lovastatin     Morphine Nausea And Vomiting 04/12/2013   Morphine and codeine Other (See Comments) 05/28/2015   Nsaids  04/16/2022   Statins Other (See Comments)  05/06/2016   Sulfamethoxazole Other (See Comments) 02/11/2011   Sulfasalazine Other (See Comments) 07/11/2011   Tramadol  04/06/2013    Family History  Problem Relation Age of Onset   Heart attack Brother 63   Diabetes Brother    Cancer Brother        colon   Colon cancer Brother 27   Hypertension Mother    Arthritis Mother    Goiter Mother    Heart attack Father 78   Cancer Sister        breast   Hypothyroidism Sister     Social History   Socioeconomic History   Marital status: Married    Spouse name: Not on file   Number of children: 3   Years of education: Not on file   Highest education level: Bachelor's degree (e.g., BA, AB, BS)  Occupational History   Occupation: former Doctor, general practice: RETIRED  Tobacco Use   Smoking status: Former   Smokeless tobacco: Never  Advertising account planner   Vaping status: Never Used  Substance and Sexual Activity   Alcohol use: No    Alcohol/week: 0.0 standard drinks of alcohol   Drug use: No   Sexual activity: Not Currently  Other Topics Concern   Not on file  Social History Narrative   Are you right handed or left handed? Right Handed   Are you currently employed ?    What is your current occupation?   Do you live at home alone? No   Who lives with you? Lives with wife.    What type of home do you live in: 1 story or 2 story? Lives in a two story home with a stair lift        Social Determinants of Health   Financial Resource Strain: Low Risk  (11/02/2022)   Overall Financial Resource Strain (CARDIA)    Difficulty of Paying Living Expenses: Not hard at all  Food Insecurity:  No Food Insecurity (11/02/2022)   Hunger Vital Sign    Worried About Running Out of Food in the Last Year: Never true    Ran Out of Food in the Last Year: Never true  Transportation Needs: No Transportation Needs (11/02/2022)   PRAPARE - Administrator, Civil Service (Medical): No    Lack of Transportation  (Non-Medical): No  Physical Activity: Insufficiently Active (11/02/2022)   Exercise Vital Sign    Days of Exercise per Week: 5 days    Minutes of Exercise per Session: 20 min  Stress: No Stress Concern Present (11/02/2022)   [employer] of Occupational Health - Occupational Stress Questionnaire    Feeling of Stress : Not at all  Social Connections: Socially Integrated (11/02/2022)   Social Connection and Isolation Panel [NHANES]    Frequency of Communication with Friends and Family: More than three times a week    Frequency of Social Gatherings with Friends and Family: Once a week    Attends Religious Services: More than 4 times per year    Active Member of [employer] or Organizations: No    Attends Engineer, structural: More than 4 times per year    Marital Status: Married  Catering manager Violence: Not At Risk (04/03/2022)   Humiliation, Afraid, Rape, and Kick questionnaire    Fear of Current or Ex-Partner: No    Emotionally Abused: No    Physically Abused: No    Sexually Abused: No    Review of Systems:    Constitutional: No weight loss, fever or chills Skin: No rash  Cardiovascular: No chest pain Respiratory: No SOB  Gastrointestinal: See HPI and otherwise negative Genitourinary: No dysuria Neurological: No headache, dizziness or syncope Musculoskeletal: No new muscle or joint pain Hematologic: No bleeding Psychiatric: No history of depression or anxiety   Physical Exam:  Vital signs: BP 120/70   Pulse (!) 49   Ht 6' (1.829 m)   Wt 204 lb (92.5 kg)   BMI 27.67 kg/m    Constitutional:   Pleasant Elderly Caucasian male appears to be in NAD, Well developed, Well nourished, alert and cooperative Head:  Normocephalic and atraumatic. Eyes:   PEERL, EOMI. No icterus. Conjunctiva pink. Ears:  Normal auditory acuity. Neck:  Supple Throat: Oral cavity and pharynx without inflammation, swelling or lesion.  Respiratory: Respirations even and unlabored. Lungs clear to  auscultation bilaterally.   No wheezes, crackles, or rhonchi.  Cardiovascular: Normal S1, S2. No MRG. Regular rate and rhythm. No peripheral edema, cyanosis or pallor.  Gastrointestinal:  Soft, nondistended, nontender. No rebound or guarding. Normal bowel sounds. No appreciable masses or hepatomegaly. Rectal:  Not performed.  Msk:  Symmetrical without gross deformities. Without edema, no deformity or joint abnormality.  Neurologic:  Alert and  oriented x4;  grossly normal neurologically.  Skin:   Dry and intact without significant lesions or rashes. Psychiatric: Demonstrates good judgement and reason without abnormal affect or behaviors.  RELEVANT LABS AND IMAGING: CBC    Component Value Date/Time   WBC 7.8 11/09/2022 1408   RBC 4.16 (L) 11/09/2022 1408   HGB 11.1 (L) 11/09/2022 1408   HCT 36.1 (L) 11/09/2022 1408   PLT 299 11/09/2022 1408   MCV 86.8 11/09/2022 1408   MCH 26.7 11/09/2022 1408   MCHC 30.7 11/09/2022 1408   RDW 15.8 (H) 11/09/2022 1408   LYMPHSABS 0.8 11/09/2022 1408   MONOABS 0.4 11/09/2022 1408   EOSABS 0.1 11/09/2022 1408   BASOSABS 0.1 11/09/2022  1408    CMP     Component Value Date/Time   NA 136 11/09/2022 1408   K 3.9 11/09/2022 1408   CL 103 11/09/2022 1408   CO2 23 11/09/2022 1408   GLUCOSE 156 (H) 11/09/2022 1408   GLUCOSE 135 (H) 01/27/2006 0757   BUN 34 (H) 11/09/2022 1408   CREATININE 1.67 (H) 11/09/2022 1408   CREATININE 1.43 (H) 10/14/2019 0957   CALCIUM 9.1 11/09/2022 1408   PROT 7.0 11/09/2022 1408   ALBUMIN 3.7 11/09/2022 1408   AST 23 11/09/2022 1408   ALT 21 11/09/2022 1408   ALKPHOS 57 11/09/2022 1408   BILITOT 1.1 11/09/2022 1408   GFRNONAA 40 (L) 11/09/2022 1408   GFRNONAA 46 (L) 10/14/2019 0957   GFRAA 53 (L) 10/14/2019 0957    Assessment: 1.  IDA: Over the past week or so, has been started on iron supplement but even prior to that started with epigastric pain and dark stool as well as a change in bowel habits; question GI  source 2.  Dark stool: With above consider GI source of bleeding 3.  Change in bowel habits: Orts constipation over the past 3 to 4 weeks, even prior to iron supplementation 4.  A-fib status post ablation and CAD status post stenting on Eliquis and Effient 5.  Abnormal EKG  Plan: 1.  Patient has a very complicated cardiac history and is following with his cardiologist even this afternoon for repeat EKG.  He remains on Effient and Eliquis, all of this makes him high risk for any anesthesia.  Will ask for cardiac clearance as well as clearance for Effient to hold for 7 days and Eliquis for 2 days prior to time of procedures if able to proceed.  Will also ask if these are better done at the hospital given complicated cardiac history. 2.  Continue Pantoprazole 40 mg twice daily, 30-60 minutes before breakfast and dinner.  Prescribed #60 with 5 refills. 3.  Discussed constipation, would recommend patient continue MiraLAX, discussed titration of this. 4.  Patient will have a 2-day bowel prep if he is able to have colonoscopy.  Did go ahead and schedule EGD and colonoscopy today with Dr. Barron Alvine but these can be changed pending cardiac clearance and review by her anesthesia team here.  Will send note to Cathlyn Parsons to review chart as well. 5.  Continue iron supplementation per PCP 6.  Patient to follow in clinic per recommendations after above.  Nathan Meeker, PA-C [facility] 11/13/2022, 8:35 AM  Cc: Corwin Levins, MD

## 2022-11-13 NOTE — Progress Notes (Signed)
Patient has been rescheduled for [facility] endoscopy/colonoscopy on 11/20/22 at 1115 am. Per Dr Barron Alvine, Effient can be continued for the test. Eliquis hold x 2 days has been requested to cardiologist, Dr Sampson Goon as well as cardiac clearance request.  Upon speaking with patient, he describes some recent shortness of breath that is concerning for him. He explains that he does not know if his cardiac stents have "closed off" or if it is seleque of his previous pneumonia. States he wants to discuss symptoms with his cardiologist at visit today and may have to change hospital procedure to a different date pending their response.

## 2022-11-13 NOTE — Progress Notes (Signed)
Patient called back and states that EKG done today looked great, but cardiology does want to further evaluate his SOB prior to procedure. They are unable to see him before 11/20/22, the current scheduled date of EGD/COLON. Patient states he must reschedule his procedure. Will reschedule tomorrow when hospital scheduling opens again.

## 2022-11-14 ENCOUNTER — Telehealth: Payer: Self-pay | Admitting: Internal Medicine

## 2022-11-14 ENCOUNTER — Other Ambulatory Visit (HOSPITAL_COMMUNITY): Payer: Self-pay

## 2022-11-14 ENCOUNTER — Telehealth: Payer: Self-pay | Admitting: *Deleted

## 2022-11-14 NOTE — Telephone Encounter (Signed)
Endoscopy/Colonoscopy has been rescheduled to [facility] (was at [facility]) on 02/10/23. Will place patient on wait list for procedure as well so he can come sooner should anything be made available. Updated instructions created in MyChart and we will continue to follow with cardiology regarding patient's clearance for procedure and for eliquis hold.

## 2022-11-14 NOTE — Telephone Encounter (Signed)
Discussed Endoscopy/Colonoscopy date change with patient and made updated instructions available via mychart.

## 2022-11-14 NOTE — Telephone Encounter (Signed)
Pt called wanting do a stool and blood test because his stool is dark or if its any cancer cells there.Please follow up with pt.

## 2022-11-14 NOTE — Telephone Encounter (Signed)
Sorry we should hold on the $800 stool test for cancer as this test actually tests for blood AND cancer, so if there is blood only, the test will be positive, and since he has been referred to GI already, we should proceed with this instead asap.  I suspect they will highly consider an EGD and maybe colonoscopy which would answer his questions.  thanks

## 2022-11-14 NOTE — Telephone Encounter (Addendum)
Progress Notes by Richardson Chiquito, RN at 11/13/2022 9:00 AM  Author: Richardson Chiquito, RN Author Type: Registered Nurse Filed: 11/13/2022  5:12 PM  Note Status: Signed Cosign: Cosign Not Required Encounter Date: 11/13/2022  Editor: Richardson Chiquito, RN (Registered Nurse)             Patient called back and states that EKG done today looked great, but cardiology does want to further evaluate his SOB prior to procedure. They are unable to see him before 11/20/22, the current scheduled date of EGD/COLON. Patient states he must reschedule his procedure. Will reschedule tomorrow when hospital scheduling opens again.        Progress Notes by Richardson Chiquito, RN at 11/13/2022 9:00 AM  Author: Richardson Chiquito, RN Author Type: Registered Nurse Filed: 11/13/2022  3:08 PM  Note Status: Signed Cosign: Cosign Not Required Encounter Date: 11/13/2022  Editor: Richardson Chiquito, RN (Registered Nurse)             Patient has been rescheduled for [facility] endoscopy/colonoscopy on 11/20/22 at 1115 am. Per Dr Barron Alvine, Effient can be continued for the test. Eliquis hold x 2 days has been requested to cardiologist, Dr Sampson Goon as well as cardiac clearance request.   Upon speaking with patient, he describes some recent shortness of breath that is concerning for him. He explains that he does not know if his cardiac stents have "closed off" or if it is seleque of his previous pneumonia. States he wants to discuss symptoms with his cardiologist at visit today and may have to change hospital procedure to a different date pending their response.        Addendum Note by Richardson Chiquito, RN at 11/13/2022 9:00 AM  Author: Richardson Chiquito, RN Author Type: Registered Nurse Filed: 11/13/2022  3:04 PM  Note Status: Signed Cosign: Cosign Not Required Encounter Date: 11/13/2022  Editor: Richardson Chiquito, RN (Registered Nurse)             Addended by: Richardson Chiquito on: 11/13/2022 03:04 PM     Modules accepted: Orders         Progress Notes by Shellia Cleverly, DO at 11/13/2022 9:00 AM  Author: Shellia Cleverly, DO Author Type: Physician Filed: 11/13/2022  1:23 PM  Note Status: Signed Cosign: Cosign Not Required Encounter Date: 11/13/2022  Editor: Shellia Cleverly, DO (Physician)             Agree with the assessment and plan as outlined by Hyacinth Meeker, PA-C.   Medically complex 82 year old individual who requires EGD/colonoscopy for evaluation of iron deficiency anemia along with concerns for metastatic pulmonary nodules on recent CT.  Chart reviewed, and while he does not have any single criteria that requires his case to be done at the hospital, in totality, based on his multiple comorbidities and advanced age I feel this would be more appropriate to be done in the hospital setting.   Dottie, assuming he gets Cardiology clearance, can we schedule him for expedited EGD/colonoscopy with me next week at [facility] or [facility]?   [place], DO, [facility]

## 2022-11-16 MED ORDER — DEXCOM G7 RECEIVER DEVI: 0 refills | Status: AC

## 2022-11-16 MED ORDER — DEXCOM G7 SENSOR MISC: 3 refills | Status: AC

## 2022-11-16 NOTE — Telephone Encounter (Signed)
Ok this is done 

## 2022-11-17 NOTE — Telephone Encounter (Signed)
Patient returned Nathan Russo's call and would like a call back at [phone].

## 2022-11-17 NOTE — Telephone Encounter (Signed)
Called and left voice mail

## 2022-11-18 ENCOUNTER — Telehealth: Payer: Self-pay | Admitting: Internal Medicine

## 2022-11-18 NOTE — Telephone Encounter (Signed)
Again, the colonoscopy was done may 2023 and no cancer was seen, so no need for any further testing for now, since that was so recent and colon cancer takes years to occur.   thanks

## 2022-11-18 NOTE — Telephone Encounter (Signed)
Sorry no this is not needed as he just had colonoscopy may 2013 which did not show any cancer.   This would be too soon to consider repeat testing.  thanks

## 2022-11-18 NOTE — Telephone Encounter (Signed)
Patient called and would like provider to order colorguard instead of doing a stool kit. Please call patient he would like to speak with provider for detailed information.

## 2022-11-18 NOTE — Telephone Encounter (Signed)
Called and spoke with Pt

## 2022-11-19 NOTE — Telephone Encounter (Signed)
Per Jari Favre, NP on 11-14-2022 said that no a-fib seen in some time. Well controlled. Low risk to hold eliquis may hold 48hr prior  Made patient aware to stop eliqus starting 02-08-2023 and his procedure will be on 02-10-2023 and he voiced understanding

## 2022-11-21 ENCOUNTER — Other Ambulatory Visit: Payer: Self-pay | Admitting: Family

## 2022-11-21 ENCOUNTER — Ambulatory Visit (INDEPENDENT_AMBULATORY_CARE_PROVIDER_SITE_OTHER): Payer: Medicare (Managed Care) | Admitting: Nurse Practitioner

## 2022-11-21 VITALS — BP 140/80 | HR 70 | Temp 98.3°F | Ht 72.0 in | Wt 208.1 lb

## 2022-11-21 DIAGNOSIS — E1140 Type 2 diabetes mellitus with diabetic neuropathy, unspecified: Secondary | ICD-10-CM | POA: Diagnosis not present

## 2022-11-21 DIAGNOSIS — Z7984 Long term (current) use of oral hypoglycemic drugs: Secondary | ICD-10-CM

## 2022-11-21 DIAGNOSIS — K921 Melena: Secondary | ICD-10-CM | POA: Diagnosis not present

## 2022-11-21 LAB — CBC
HCT: 36.4 % — ABNORMAL LOW (ref 39.0–52.0)
Hemoglobin: 11.5 g/dL — ABNORMAL LOW (ref 13.0–17.0)
MCHC: 31.6 g/dL (ref 30.0–36.0)
MCV: 84.8 fl (ref 78.0–100.0)
Platelets: 334 10*3/uL (ref 150.0–400.0)
RBC: 4.29 Mil/uL (ref 4.22–5.81)
RDW: 17.1 % — ABNORMAL HIGH (ref 11.5–15.5)
WBC: 7.3 10*3/uL (ref 4.0–10.5)

## 2022-11-21 LAB — BASIC METABOLIC PANEL
BUN: 28 mg/dL — ABNORMAL HIGH (ref 6–23)
CO2: 25 meq/L (ref 19–32)
Calcium: 9.2 mg/dL (ref 8.4–10.5)
Chloride: 107 meq/L (ref 96–112)
Creatinine, Ser: 1.87 mg/dL — ABNORMAL HIGH (ref 0.40–1.50)
GFR: 32.65 mL/min — ABNORMAL LOW (ref >60.00)
Glucose, Bld: 113 mg/dL — ABNORMAL HIGH (ref 70–99)
Potassium: 4.4 meq/L (ref 3.5–5.1)
Sodium: 139 meq/L (ref 135–145)

## 2022-11-21 LAB — HEMOCCULT GUIAC POC 1CARD (OFFICE): Fecal Occult Blood, POC: NEGATIVE

## 2022-11-21 MED ORDER — METFORMIN HCL ER 500 MG PO TB24: 500.0000 mg | ORAL_TABLET | Freq: Every day | ORAL | Status: DC

## 2022-11-21 NOTE — Assessment & Plan Note (Signed)
Chronic A1c at goal Has chronic kidney disease getting close to being too low to continue metformin use. Due to reports of lows recommend reducing metformin dose to 500 mg once a day with breakfast Follow-up with PCP on Monday as scheduled

## 2022-11-21 NOTE — Progress Notes (Signed)
Established Patient Office Visit  Subjective   Patient ID: Nathan Russo, male    DOB: Dec 28, 1940  Age: 82 y.o. MRN: [id number]  Chief Complaint  Patient presents with   Medical Management of Chronic Issues    Black stool ( no blood in the stool), sugar monitor is beeping,  Glucose is 140    Here for acute visit for the above.   Has noticed dark/tarry stools for a few weeks. Has seen gastroenterology and was planning on undergoing EGD but this was canceled due to cardiology not providing clearance for surgery due to chronic heart disease including atrial fibrillation, cardiomyopathy, coronary artery disease in time for the procedure.  He is currently on iron supplement and eliquis as part of his treatment of anemia and atrial fibrillaiton.   He reports that he does get dizzy and short of breath with exertion at times, but these symptoms resolve within a few moments and are not significantly impacting his ability to complete ADLs and he also uses a recumbent bike at times without difficulty.   He has a family history of colon cancer and is asking for evaluation of his black tarry stools due to his concerns that cancer may be contributing. He is asking to order cologuard.   He reports cardiology has now given their recommendations for surgical clearance as of two days ago, I am unable to view these records at this time. He is now scheduled to undergo EGD and colonoscopy in December, but feels this is too long to wait.   He also mentions that he has had a few hypoglycemic events early in the morning over the last few days.  Reports blood sugars have been getting into the mid to upper 60s.  Uses Dexcom to monitor blood sugars.  He has an appointment with his PCP in 3 days.     ROS: see HPI    Objective:     BP (!) 140/80   Pulse 70   Temp 98.3 F (36.8 C) (Temporal)   Ht 6' (1.829 m)   Wt 208 lb 2 oz (94.4 kg)   SpO2 95%   BMI 28.23 kg/m  BP Readings from Last 3 Encounters:   11/21/22 (!) 140/80  11/13/22 120/70  11/11/22 126/82   Wt Readings from Last 3 Encounters:  11/21/22 208 lb 2 oz (94.4 kg)  11/13/22 204 lb (92.5 kg)  11/11/22 207 lb (93.9 kg)      Physical Exam Vitals reviewed. Exam conducted with a chaperone present.  Constitutional:      Appearance: Normal appearance.  HENT:     Head: Normocephalic and atraumatic.  Cardiovascular:     Rate and Rhythm: Normal rate and regular rhythm.  Pulmonary:     Effort: Pulmonary effort is normal.     Breath sounds: Normal breath sounds.  Genitourinary:    Prostate: Enlarged.     Rectum: Normal. No anal fissure. Normal anal tone.  Musculoskeletal:     Cervical back: Neck supple.  Skin:    General: Skin is warm and dry.  Neurological:     Mental Status: He is alert and oriented to person, place, and time.  Psychiatric:        Mood and Affect: Mood normal.        Behavior: Behavior normal.        Thought Content: Thought content normal.        Judgment: Judgment normal.      Results for orders placed or  performed in visit on 11/21/22  Basic metabolic panel  Result Value Ref Range   Sodium 139 135 - 145 mEq/L   Potassium 4.4 3.5 - 5.1 mEq/L   Chloride 107 96 - 112 mEq/L   CO2 25 19 - 32 mEq/L   Glucose, Bld 113 (H) 70 - 99 mg/dL   BUN 28 (H) 6 - 23 mg/dL   Creatinine, Ser 9.56 (H) 0.40 - 1.50 mg/dL   GFR 21.30 (L) >86.57 mL/min   Calcium 9.2 8.4 - 10.5 mg/dL  CBC  Result Value Ref Range   WBC 7.3 4.0 - 10.5 K/uL   RBC 4.29 4.22 - 5.81 Mil/uL   Platelets 334.0 150.0 - 400.0 K/uL   Hemoglobin 11.5 (L) 13.0 - 17.0 g/dL   HCT 84.6 (L) 96.2 - 95.2 %   MCV 84.8 78.0 - 100.0 fl   MCHC 31.6 30.0 - 36.0 g/dL   RDW 84.1 (H) 32.4 - 40.1 %  POCT occult blood stool  Result Value Ref Range   Fecal Occult Blood, POC Negative Negative   Card #1 Date     Card #2 Fecal Occult Blod, POC     Card #2 Date     Card #3 Fecal Occult Blood, POC     Card #3 Date        The ASCVD Risk score  (Arnett DK, et al., 2019) failed to calculate for the following reasons:   The 2019 ASCVD risk score is only valid for ages 84 to 62   The patient has a prior MI or stroke diagnosis    Assessment & Plan:   Problem List Items Addressed This Visit       Endocrine   Diabetes mellitus (HCC)    Chronic A1c at goal Has chronic kidney disease getting close to being too low to continue metformin use. Due to reports of lows recommend reducing metformin dose to 500 mg once a day with breakfast Follow-up with PCP on Monday as scheduled      Relevant Medications   metFORMIN (GLUCOPHAGE-XR) 500 MG 24 hr tablet     Other   Black stool - Primary    Subacute, stable CBC ordered today which identified stable and actually improving hemoglobin.  Point-of-care Hemoccult stool negative.  We discussed that Cologuard is used for colon cancer screening and and individual with average risk of colon cancer.  Due to his family history he may be above average risk and due to the fact that he has symptomatic concern related to his bowels would make Cologuard inappropriate for further testing.  Thus he needs to have evaluation with direct visualization as previously recommended by gastroenterology. For now, vital signs stable, anemia remained mild and stable. Etiology unclear, encouraged him to follow-up with gastroenterology as scheduled.      Relevant Orders   CBC (Completed)   POCT occult blood stool (Completed)   Basic metabolic panel (Completed)    Return as scheduled.  Total time spent on office visit today was 38 minutes including face-to-face evaluation, review of previous records, and development/discussion of treatment plan   Elenore Paddy, NP

## 2022-11-21 NOTE — Assessment & Plan Note (Signed)
Subacute, stable CBC ordered today which identified stable and actually improving hemoglobin.  Point-of-care Hemoccult stool negative.  We discussed that Cologuard is used for colon cancer screening and and individual with average risk of colon cancer.  Due to his family history he may be above average risk and due to the fact that he has symptomatic concern related to his bowels would make Cologuard inappropriate for further testing.  Thus he needs to have evaluation with direct visualization as previously recommended by gastroenterology. For now, vital signs stable, anemia remained mild and stable. Etiology unclear, encouraged him to follow-up with gastroenterology as scheduled.

## 2022-11-24 ENCOUNTER — Ambulatory Visit (INDEPENDENT_AMBULATORY_CARE_PROVIDER_SITE_OTHER): Payer: Medicare (Managed Care) | Admitting: Internal Medicine

## 2022-11-24 ENCOUNTER — Ambulatory Visit: Payer: Medicare (Managed Care) | Admitting: Family Medicine

## 2022-11-24 ENCOUNTER — Encounter: Payer: Self-pay | Admitting: Internal Medicine

## 2022-11-24 VITALS — BP 120/68 | HR 60 | Temp 98.4°F | Ht 72.0 in | Wt 208.0 lb

## 2022-11-24 DIAGNOSIS — E7800 Pure hypercholesterolemia, unspecified: Secondary | ICD-10-CM | POA: Diagnosis not present

## 2022-11-24 DIAGNOSIS — Z7984 Long term (current) use of oral hypoglycemic drugs: Secondary | ICD-10-CM | POA: Diagnosis not present

## 2022-11-24 DIAGNOSIS — E1140 Type 2 diabetes mellitus with diabetic neuropathy, unspecified: Secondary | ICD-10-CM

## 2022-11-24 DIAGNOSIS — I10 Essential (primary) hypertension: Secondary | ICD-10-CM | POA: Diagnosis not present

## 2022-11-24 DIAGNOSIS — D509 Iron deficiency anemia, unspecified: Secondary | ICD-10-CM | POA: Diagnosis not present

## 2022-11-24 NOTE — Assessment & Plan Note (Signed)
BP Readings from Last 3 Encounters:  11/24/22 120/68  11/21/22 (!) 140/80  11/13/22 120/70   Stable, pt to continue medical treatment norvasc 2.5 every day, losartan 100 every day, toprol xl 12.5 qd

## 2022-11-24 NOTE — Patient Instructions (Signed)
Ok to stop the last metformin  Please continue all other medications as before, and refills have been done if requested.  Please have the pharmacy call with any other refills you may need.  Please continue your efforts at being more active, low cholesterol diet, and weight control.  Please keep your appointments with your specialists as you may have planned - EGD and colonoscopy  Please make an Appointment to return in Jan 2025, or sooner if needed

## 2022-11-24 NOTE — Assessment & Plan Note (Signed)
Pt advised to continue plan for egd colonsocopy and capsule as planned for further evaluation  Lab Results  Component Value Date   WBC 7.3 11/21/2022   HGB 11.5 (L) 11/21/2022   HCT 36.4 (L) 11/21/2022   MCV 84.8 11/21/2022   PLT 334.0 11/21/2022

## 2022-11-24 NOTE — Assessment & Plan Note (Signed)
Lab Results  Component Value Date   HGBA1C 6.4 10/30/2022   Now overcontrolled on minimal OHA use,  pt to d/c metformin and continue DM diet only, cont G7 use but may be ok to hold if sugars remain controlled as sensors are expensive.

## 2022-11-24 NOTE — Telephone Encounter (Signed)
Pt came in for appt today.

## 2022-11-24 NOTE — Assessment & Plan Note (Signed)
Lab Results  Component Value Date   LDLCALC 61 10/30/2022   Stable, pt to continue current statin zetia 10 qd

## 2022-11-24 NOTE — Progress Notes (Signed)
Patient ID: Nathan Russo, male   DOB: 07-04-1940, 82 y.o.   MRN: [id number]        Chief Complaint: follow up dm with recent lower sugars, iron def anemia, htn, hld       HPI:  Nathan Russo is a 82 y.o. male here with c/o recent start using Dexcom G7 glucometer and immediately finding low sugar alarms awakening him overnight even with snack at bedtime.  Recently we have reduced his OHA use to metformin ER 500 mg - 1 every day only, with good compliance, but still with low overnight sugars.  Last night awakened 3 times despite eating to improve his sugars.  Pt denies chest pain, increased sob or doe, wheezing, orthopnea, PND, increased LE swelling, palpitations, dizziness or syncope.   Pt denies polydipsia, polyuria, or new focal neuro s/s.    Pt denies fever, wt loss, night sweats, loss of appetite, or other constitutional symptoms   Pt has been Riding recumbant bike over 2 miles per day , sometimes more for daily exercise no recent change.  No overt bleeding bruising. Hemoccult neg recent.  He hesitates for egd colonoscopy but realizes there is little option.    Wt Readings from Last 3 Encounters:  11/24/22 208 lb (94.3 kg)  11/21/22 208 lb 2 oz (94.4 kg)  11/13/22 204 lb (92.5 kg)   BP Readings from Last 3 Encounters:  11/24/22 120/68  11/21/22 (!) 140/80  11/13/22 120/70         Past Medical History:  Diagnosis Date   Abdominal pain, epigastric 04/11/2010   BELCHING 04/23/2010   BRADYCARDIA, CHRONIC 10/24/2006   CARPAL TUNNEL SYNDROME, BILATERAL 02/07/2009   CHEST PAIN-UNSPECIFIED 09/05/2008   CHOLELITHIASIS 04/22/2010   Cholelithiasis 06/13/2010   COLONIC POLYPS, HX OF 04/28/2007   DEGENERATIVE JOINT DISEASE, RIGHT KNEE 10/24/2006   Depression 02/23/2011   DIABETES MELLITUS, TYPE II 04/28/2007   Dizziness and giddiness 12/19/2009   Elevated PSA 06/13/2010   FATIGUE 04/28/2007   GERD 02/11/2009   Headache(784.0) 12/19/2009   HYPERLIPIDEMIA 04/28/2007   HYPERTENSION 10/21/2006   NECK MASS  02/07/2010   OBESITY 10/24/2006   OTITIS MEDIA, ACUTE, LEFT 12/26/2009   PHIMOSIS 02/07/2009   S/P laparoscopic cholecystectomy 10/31/2010   Thyroid cancer (HCC) 06/13/2010   THYROID NODULE 02/07/2010   Past Surgical History:  Procedure Laterality Date   CHOLECYSTECTOMY     left knee surgery     right wrist surgury     THYROID SURGERY      reports that he has quit smoking. He has never used smokeless tobacco. He reports that he does not drink alcohol and does not use drugs. family history includes Arthritis in his mother; Breast cancer in his sister; Colon cancer (age of onset: 42) in his brother; Diabetes in his brother; Goiter in his mother; Heart attack (age of onset: 63) in his brother; Heart attack (age of onset: 61) in his father; Hypertension in his mother; Hypothyroidism in his sister. Allergies  Allergen Reactions   Diphenoxylate-Atropine Other (See Comments)   Other Other (See Comments) and Hives    Causes body to ache Causes body to ache   Sulfa Antibiotics Other (See Comments)    As child almost died   Ace Inhibitors     REACTION: cough   Atenolol     REACTION: bradycardia   Codeine Other (See Comments)    Head spins "wild"   Levaquin [Levofloxacin]     Interferes with flecainide   Lovastatin  REACTION: myalygros   Morphine Nausea And Vomiting   Morphine And Codeine Other (See Comments)   Nsaids     Heart patient   Statins Other (See Comments)    Causes body to ache   Sulfamethoxazole Other (See Comments)    Childhood unknown reaction   Sulfasalazine Other (See Comments)    As child almost died   Tramadol     Ants crawling all over   Current Outpatient Medications on File Prior to Visit  Medication Sig Dispense Refill   amiodarone (PACERONE) 200 MG tablet Take by mouth.     amLODipine (NORVASC) 2.5 MG tablet Take 1 tablet (2.5 mg total) by mouth daily. (Patient taking differently: Take 2.5 mg by mouth at bedtime.) 90 tablet 3   apixaban (ELIQUIS) 5 MG TABS  tablet Take 2.5 mg by mouth 2 (two) times daily.     Ascorbic Acid (VITAMIN C) 1000 MG tablet Take 1,000 mg by mouth daily.     augmented betamethasone dipropionate (DIPROLENE-AF) 0.05 % ointment betamethasone, augmented 0.05 % topical ointment  APPLY OINTMENT TOPICALLY TO EACH EAR AS NEEDED FOR ITCHING     Blood Glucose Monitoring Suppl (ONETOUCH VERIO FLEX SYSTEM) w/Device KIT Use as directed three times per day E11.9 1 kit 0   Calcium Carb-Cholecalciferol [phone] MG-UNIT CAPS Take by mouth.     ciclopirox (PENLAC) 8 % solution      Cinnamon 500 MG capsule Take 500 mg by mouth 2 (two) times daily.     Continuous Glucose Receiver (DEXCOM G7 RECEIVER) DEVI Use as directed once daily E11.9 1 each 0   Continuous Glucose Sensor (DEXCOM G7 SENSOR) MISC Use as directed once every 10 days E11.9 9 each 3   cyanocobalamin 1000 MCG tablet Take 1,000 mcg by mouth daily.     Diclofenac Sodium (PENNSAID) 2 % SOLN      ezetimibe (ZETIA) 10 MG tablet Take 1 tablet (10 mg total) by mouth daily. 90 tablet 3   Flaxseed, Linseed, 1000 MG CAPS Take 1 capsule by mouth daily.     Garlic Oil 500 MG TABS Take by mouth 2 (two) times daily.     Ginger, Zingiber officinalis, (GINGER PO) Take 1 tablet by mouth in the morning and at bedtime.     Glucosamine-Chondroit-Vit C-Mn (GLUCOSAMINE CHONDROITIN COMPLX) CAPS Take by mouth daily.     Glucosamine-Chondroitin 250-200 MG TABS Take 1 tablet by mouth at bedtime.     glucose blood (ACCU-CHEK GUIDE) test strip Use as instructed four time per day E11.9 400 each 12   hydrocortisone 2.5 % ointment Apply 1 Application topically 2 (two) times daily.     iron polysaccharides (NU-IRON) 150 MG capsule Take 1 capsule (150 mg total) by mouth daily. 90 capsule 1   isosorbide mononitrate (IMDUR) 60 MG 24 hr tablet Take by mouth.     Lancets MISC Use as directed three times per day E11.9 300 each 3   levothyroxine (SYNTHROID) 112 MCG tablet Take 1 tablet (112 mcg total) by mouth daily.  90 tablet 3   losartan (COZAAR) 100 MG tablet Take 1 tablet (100 mg total) by mouth daily. 90 tablet 3   metoprolol succinate (TOPROL-XL) 25 MG 24 hr tablet 12.5 mg daily.     Multiple Vitamin (MULTIVITAMIN) capsule Take 1 capsule by mouth daily.     nitroGLYCERIN (NITROSTAT) 0.4 MG SL tablet 0.4 mg every 5 (five) minutes as needed for chest pain.     Omega-3 Fatty Acids (FISH OIL)  1000 MG CAPS Take by mouth 2 (two) times daily.     pantoprazole (PROTONIX) 40 MG tablet Take 1 tablet (40 mg total) by mouth 2 (two) times daily. 60 tablet 5   prasugrel (EFFIENT) 10 MG TABS tablet Take 10 mg by mouth daily.     triamcinolone (KENALOG) 0.025 % cream Apply topically.     VITAMIN D, CHOLECALCIFEROL, PO Take by mouth daily.     No current facility-administered medications on file prior to visit.        ROS:  All others reviewed and negative.  Objective        PE:  BP 120/68 (BP Location: Right Arm, Patient Position: Sitting, Cuff Size: Normal)   Pulse 60   Temp 98.4 F (36.9 C) (Oral)   Ht 6' (1.829 m)   Wt 208 lb (94.3 kg)   SpO2 98%   BMI 28.21 kg/m                 Constitutional: Pt appears in NAD               HENT: Head: NCAT.                Right Ear: External ear normal.                 Left Ear: External ear normal.                Eyes: . Pupils are equal, round, and reactive to light. Conjunctivae and EOM are normal               Nose: without d/c or deformity               Neck: Neck supple. Gross normal ROM               Cardiovascular: Normal rate and regular rhythm.                 Pulmonary/Chest: Effort normal and breath sounds without rales or wheezing.                Abd:  Soft, NT, ND, + BS, no organomegaly               Neurological: Pt is alert. At baseline orientation, motor grossly intact               Skin: Skin is warm. No rashes, no other new lesions, LE edema - none               Psychiatric: Pt behavior is normal without agitation   Micro: none  Cardiac  tracings I have personally interpreted today:  none  Pertinent Radiological findings (summarize): none   Lab Results  Component Value Date   WBC 7.3 11/21/2022   HGB 11.5 (L) 11/21/2022   HCT 36.4 (L) 11/21/2022   PLT 334.0 11/21/2022   GLUCOSE 113 (H) 11/21/2022   CHOL 136 10/30/2022   TRIG 173.0 (H) 10/30/2022   HDL 40.00 10/30/2022   LDLDIRECT 80.0 03/18/2022   LDLCALC 61 10/30/2022   ALT 21 11/09/2022   AST 23 11/09/2022   NA 139 11/21/2022   K 4.4 11/21/2022   CL 107 11/21/2022   CREATININE 1.87 (H) 11/21/2022   BUN 28 (H) 11/21/2022   CO2 25 11/21/2022   TSH 8.272 (H) 11/09/2022   PSA 3.59 05/08/2017   INR 1.0 08/22/2008   HGBA1C 6.4 10/30/2022   MICROALBUR 1.8 09/24/2022   Assessment/Plan:  Nathan Russo is a 82 y.o. White or Caucasian [1] male with  has a past medical history of Abdominal pain, epigastric (04/11/2010), BELCHING (04/23/2010), BRADYCARDIA, CHRONIC (10/24/2006), CARPAL TUNNEL SYNDROME, BILATERAL (02/07/2009), CHEST PAIN-UNSPECIFIED (09/05/2008), CHOLELITHIASIS (04/22/2010), Cholelithiasis (06/13/2010), COLONIC POLYPS, HX OF (04/28/2007), DEGENERATIVE JOINT DISEASE, RIGHT KNEE (10/24/2006), Depression (02/23/2011), DIABETES MELLITUS, TYPE II (04/28/2007), Dizziness and giddiness (12/19/2009), Elevated PSA (06/13/2010), FATIGUE (04/28/2007), GERD (02/11/2009), Headache(784.0) (12/19/2009), HYPERLIPIDEMIA (04/28/2007), HYPERTENSION (10/21/2006), NECK MASS (02/07/2010), OBESITY (10/24/2006), OTITIS MEDIA, ACUTE, LEFT (12/26/2009), PHIMOSIS (02/07/2009), S/P laparoscopic cholecystectomy (10/31/2010), Thyroid cancer (HCC) (06/13/2010), and THYROID NODULE (02/07/2010).  Diabetes mellitus (HCC) Lab Results  Component Value Date   HGBA1C 6.4 10/30/2022   Now overcontrolled on minimal OHA use,  pt to d/c metformin and continue DM diet only, cont G7 use but may be ok to hold if sugars remain controlled as sensors are expensive.    Essential hypertension BP Readings from Last 3 Encounters:   11/24/22 120/68  11/21/22 (!) 140/80  11/13/22 120/70   Stable, pt to continue medical treatment norvasc 2.5 every day, losartan 100 every day, toprol xl 12.5 qd   Hyperlipidemia Lab Results  Component Value Date   LDLCALC 61 10/30/2022   Stable, pt to continue current statin zetia 10 qd   Iron deficiency anemia Pt advised to continue plan for egd colonsocopy and capsule as planned for further evaluation  Lab Results  Component Value Date   WBC 7.3 11/21/2022   HGB 11.5 (L) 11/21/2022   HCT 36.4 (L) 11/21/2022   MCV 84.8 11/21/2022   PLT 334.0 11/21/2022    Followup: Return in about 4 months (around 03/26/2023), or if symptoms worsen or fail to improve.  Oliver Barre, MD 11/24/2022 10:02 PM [facility]

## 2022-11-25 ENCOUNTER — Other Ambulatory Visit: Payer: Self-pay | Admitting: Family

## 2022-11-26 ENCOUNTER — Other Ambulatory Visit: Payer: Self-pay | Admitting: Internal Medicine

## 2022-11-26 DIAGNOSIS — E1140 Type 2 diabetes mellitus with diabetic neuropathy, unspecified: Secondary | ICD-10-CM

## 2022-11-26 DIAGNOSIS — R269 Unspecified abnormalities of gait and mobility: Secondary | ICD-10-CM

## 2022-11-26 DIAGNOSIS — I2510 Atherosclerotic heart disease of native coronary artery without angina pectoris: Secondary | ICD-10-CM

## 2022-11-26 DIAGNOSIS — I255 Ischemic cardiomyopathy: Secondary | ICD-10-CM

## 2022-12-03 DIAGNOSIS — I4719 Other supraventricular tachycardia: Secondary | ICD-10-CM | POA: Diagnosis not present

## 2022-12-04 ENCOUNTER — Ambulatory Visit: Payer: Medicare (Managed Care)

## 2022-12-04 DIAGNOSIS — Z23 Encounter for immunization: Secondary | ICD-10-CM

## 2022-12-15 DIAGNOSIS — R0789 Other chest pain: Secondary | ICD-10-CM | POA: Diagnosis not present

## 2022-12-16 DIAGNOSIS — I4719 Other supraventricular tachycardia: Secondary | ICD-10-CM | POA: Diagnosis not present

## 2022-12-23 ENCOUNTER — Telehealth: Payer: Self-pay | Admitting: Gastroenterology

## 2022-12-23 DIAGNOSIS — K921 Melena: Secondary | ICD-10-CM | POA: Diagnosis not present

## 2022-12-23 DIAGNOSIS — D649 Anemia, unspecified: Secondary | ICD-10-CM | POA: Diagnosis not present

## 2022-12-23 NOTE — Telephone Encounter (Signed)
Inbound call from patient stating that he wanted to cancel his procedures scheduled for 12/3 at the hospital  with Dr. Barron Alvine. Patient was seen with [facility]. Please advise.

## 2022-12-23 NOTE — Telephone Encounter (Signed)
02/10/23 hospital procedure has been cancelled as per patient request.

## 2022-12-24 ENCOUNTER — Encounter: Payer: Self-pay | Admitting: Internal Medicine

## 2022-12-24 DIAGNOSIS — C73 Malignant neoplasm of thyroid gland: Secondary | ICD-10-CM | POA: Diagnosis not present

## 2022-12-26 DIAGNOSIS — R1013 Epigastric pain: Secondary | ICD-10-CM | POA: Diagnosis not present

## 2022-12-26 DIAGNOSIS — R109 Unspecified abdominal pain: Secondary | ICD-10-CM | POA: Diagnosis not present

## 2022-12-26 DIAGNOSIS — I252 Old myocardial infarction: Secondary | ICD-10-CM | POA: Diagnosis not present

## 2022-12-26 DIAGNOSIS — E119 Type 2 diabetes mellitus without complications: Secondary | ICD-10-CM | POA: Diagnosis not present

## 2022-12-26 DIAGNOSIS — R079 Chest pain, unspecified: Secondary | ICD-10-CM | POA: Diagnosis not present

## 2022-12-26 DIAGNOSIS — Z79899 Other long term (current) drug therapy: Secondary | ICD-10-CM | POA: Diagnosis not present

## 2022-12-26 DIAGNOSIS — E7800 Pure hypercholesterolemia, unspecified: Secondary | ICD-10-CM | POA: Diagnosis not present

## 2022-12-26 DIAGNOSIS — R0789 Other chest pain: Secondary | ICD-10-CM | POA: Diagnosis not present

## 2022-12-26 DIAGNOSIS — I10 Essential (primary) hypertension: Secondary | ICD-10-CM | POA: Diagnosis not present

## 2022-12-26 DIAGNOSIS — Z955 Presence of coronary angioplasty implant and graft: Secondary | ICD-10-CM | POA: Diagnosis not present

## 2022-12-26 DIAGNOSIS — I440 Atrioventricular block, first degree: Secondary | ICD-10-CM | POA: Diagnosis not present

## 2022-12-26 DIAGNOSIS — E860 Dehydration: Secondary | ICD-10-CM | POA: Diagnosis not present

## 2022-12-26 DIAGNOSIS — Z7901 Long term (current) use of anticoagulants: Secondary | ICD-10-CM | POA: Diagnosis not present

## 2022-12-26 DIAGNOSIS — I444 Left anterior fascicular block: Secondary | ICD-10-CM | POA: Diagnosis not present

## 2022-12-26 DIAGNOSIS — Z7984 Long term (current) use of oral hypoglycemic drugs: Secondary | ICD-10-CM | POA: Diagnosis not present

## 2022-12-26 DIAGNOSIS — N179 Acute kidney failure, unspecified: Secondary | ICD-10-CM | POA: Diagnosis not present

## 2022-12-26 DIAGNOSIS — Z7902 Long term (current) use of antithrombotics/antiplatelets: Secondary | ICD-10-CM | POA: Diagnosis not present

## 2022-12-26 DIAGNOSIS — I2510 Atherosclerotic heart disease of native coronary artery without angina pectoris: Secondary | ICD-10-CM | POA: Diagnosis not present

## 2022-12-29 DIAGNOSIS — C73 Malignant neoplasm of thyroid gland: Secondary | ICD-10-CM | POA: Diagnosis not present

## 2022-12-29 DIAGNOSIS — K921 Melena: Secondary | ICD-10-CM | POA: Diagnosis not present

## 2022-12-29 DIAGNOSIS — D649 Anemia, unspecified: Secondary | ICD-10-CM | POA: Diagnosis not present

## 2022-12-31 ENCOUNTER — Encounter: Payer: Medicare (Managed Care) | Admitting: Gastroenterology

## 2022-12-31 DIAGNOSIS — R001 Bradycardia, unspecified: Secondary | ICD-10-CM | POA: Diagnosis not present

## 2022-12-31 DIAGNOSIS — E118 Type 2 diabetes mellitus with unspecified complications: Secondary | ICD-10-CM | POA: Diagnosis not present

## 2022-12-31 DIAGNOSIS — R0609 Other forms of dyspnea: Secondary | ICD-10-CM | POA: Diagnosis not present

## 2022-12-31 DIAGNOSIS — I2510 Atherosclerotic heart disease of native coronary artery without angina pectoris: Secondary | ICD-10-CM | POA: Diagnosis not present

## 2022-12-31 DIAGNOSIS — I10 Essential (primary) hypertension: Secondary | ICD-10-CM | POA: Diagnosis not present

## 2022-12-31 DIAGNOSIS — I440 Atrioventricular block, first degree: Secondary | ICD-10-CM | POA: Diagnosis not present

## 2022-12-31 DIAGNOSIS — I480 Paroxysmal atrial fibrillation: Secondary | ICD-10-CM | POA: Diagnosis not present

## 2023-01-05 ENCOUNTER — Telehealth: Payer: Self-pay | Admitting: Internal Medicine

## 2023-01-05 NOTE — Telephone Encounter (Signed)
Ok to hold on refill for now as this is not needed for now

## 2023-01-05 NOTE — Telephone Encounter (Signed)
Prescription Request  01/05/2023  LOV: 11/24/2022  What is the name of the medication or equipment? metformin  Have you contacted your pharmacy to request a refill? Yes   Which pharmacy would you like this sent to?  [id number] [facility], [place] - [address] [facility] [place] [address] Phone: [phone] Fax: [phone]    Patient notified that their request is being sent to the clinical staff for review and that they should receive a response within 2 business days.   Please advise at Mobile [phone] (mobile)

## 2023-01-28 DIAGNOSIS — R0609 Other forms of dyspnea: Secondary | ICD-10-CM | POA: Diagnosis not present

## 2023-01-28 DIAGNOSIS — I480 Paroxysmal atrial fibrillation: Secondary | ICD-10-CM | POA: Diagnosis not present

## 2023-01-28 DIAGNOSIS — I2510 Atherosclerotic heart disease of native coronary artery without angina pectoris: Secondary | ICD-10-CM | POA: Diagnosis not present

## 2023-01-29 DIAGNOSIS — D649 Anemia, unspecified: Secondary | ICD-10-CM | POA: Diagnosis not present

## 2023-01-30 ENCOUNTER — Other Ambulatory Visit: Payer: Self-pay | Admitting: Internal Medicine

## 2023-01-30 DIAGNOSIS — J189 Pneumonia, unspecified organism: Secondary | ICD-10-CM

## 2023-01-30 DIAGNOSIS — R269 Unspecified abnormalities of gait and mobility: Secondary | ICD-10-CM

## 2023-01-30 DIAGNOSIS — E1140 Type 2 diabetes mellitus with diabetic neuropathy, unspecified: Secondary | ICD-10-CM

## 2023-01-30 DIAGNOSIS — I4719 Other supraventricular tachycardia: Secondary | ICD-10-CM

## 2023-02-10 ENCOUNTER — Ambulatory Visit (HOSPITAL_COMMUNITY): Admit: 2023-02-10 | Payer: Medicare (Managed Care) | Admitting: Gastroenterology

## 2023-02-10 ENCOUNTER — Encounter (HOSPITAL_COMMUNITY): Payer: Self-pay

## 2023-02-10 SURGERY — ESOPHAGOGASTRODUODENOSCOPY (EGD) WITH PROPOFOL
Anesthesia: Monitor Anesthesia Care

## 2023-02-12 DIAGNOSIS — M65351 Trigger finger, right little finger: Secondary | ICD-10-CM | POA: Insufficient documentation

## 2023-02-12 DIAGNOSIS — M79645 Pain in left finger(s): Secondary | ICD-10-CM | POA: Insufficient documentation

## 2023-02-21 DIAGNOSIS — R519 Headache, unspecified: Secondary | ICD-10-CM | POA: Diagnosis not present

## 2023-02-21 DIAGNOSIS — R059 Cough, unspecified: Secondary | ICD-10-CM | POA: Diagnosis not present

## 2023-02-25 DIAGNOSIS — M65352 Trigger finger, left little finger: Secondary | ICD-10-CM | POA: Diagnosis not present

## 2023-02-25 DIAGNOSIS — M65311 Trigger thumb, right thumb: Secondary | ICD-10-CM | POA: Diagnosis not present

## 2023-03-08 DIAGNOSIS — R300 Dysuria: Secondary | ICD-10-CM | POA: Diagnosis not present

## 2023-03-08 DIAGNOSIS — N419 Inflammatory disease of prostate, unspecified: Secondary | ICD-10-CM | POA: Diagnosis not present

## 2023-03-10 DIAGNOSIS — C73 Malignant neoplasm of thyroid gland: Secondary | ICD-10-CM | POA: Diagnosis not present

## 2023-03-19 ENCOUNTER — Ambulatory Visit (INDEPENDENT_AMBULATORY_CARE_PROVIDER_SITE_OTHER): Payer: Medicare Other | Admitting: Internal Medicine

## 2023-03-19 VITALS — BP 126/80 | HR 57 | Temp 98.1°F | Ht 72.0 in | Wt 210.0 lb

## 2023-03-19 DIAGNOSIS — J069 Acute upper respiratory infection, unspecified: Secondary | ICD-10-CM | POA: Diagnosis not present

## 2023-03-19 DIAGNOSIS — R3912 Poor urinary stream: Secondary | ICD-10-CM | POA: Diagnosis not present

## 2023-03-19 DIAGNOSIS — I10 Essential (primary) hypertension: Secondary | ICD-10-CM

## 2023-03-19 DIAGNOSIS — N401 Enlarged prostate with lower urinary tract symptoms: Secondary | ICD-10-CM | POA: Diagnosis not present

## 2023-03-19 DIAGNOSIS — E1140 Type 2 diabetes mellitus with diabetic neuropathy, unspecified: Secondary | ICD-10-CM

## 2023-03-19 MED ORDER — ACCU-CHEK GUIDE CONTROL VI LIQD: 3 refills | Status: AC

## 2023-03-19 MED ORDER — ACCU-CHEK GUIDE TEST VI STRP: ORAL_STRIP | 12 refills | Status: DC

## 2023-03-19 MED ORDER — LEVOFLOXACIN 500 MG PO TABS: 500.0000 mg | ORAL_TABLET | Freq: Every day | ORAL | 0 refills | Status: DC

## 2023-03-19 MED ORDER — ACCU-CHEK GUIDE ME W/DEVICE KIT: PACK | 1 refills | Status: AC

## 2023-03-19 MED ORDER — TAMSULOSIN HCL 0.4 MG PO CAPS: 0.4000 mg | ORAL_CAPSULE | Freq: Every day | ORAL | 3 refills | Status: AC

## 2023-03-19 NOTE — Progress Notes (Signed)
 Patient ID: Nathan Russo, male   DOB: [birth year], 83 y.o.   MRN: [id number]        Chief Complaint: follow up sinusitis, dm, bph symptoms, htn,        HPI:  Nathan Russo is a 83 y.o. male  Here with 2-3 days acute onset fever, facial pain, pressure, headache, general weakness and malaise, and greenish d/c, with mild ST and cough, but pt denies chest pain, wheezing, increased sob or doe, orthopnea, PND, increased LE swelling, palpitations, dizziness or syncope.   Pt denies polydipsia, polyuria, or new focal neuro s/s.   Needs Guide Me glucometer rx.  Also with bph symptoms with urinary weak stream and slowing.  Denies urinary symptoms such as dysuria, frequency, urgency, flank pain, hematuria or n/v, fever, chills.   Also has been doing home PT exercises on his won every day upt o 30 min and doing very well with more stamina.  Using 3 lb wt s with arm and shoulder excercises doing well without pain worsening.  Did also see [facility] about 4 wks ago due to feeling bad, tx with doxycylcine empirically it seems, not much better after 1 wk, and second antibiotic seemed to help, but still not feeling well.  Had sinus infection several times last yr - new for him.  .   Wt Readings from Last 3 Encounters:  03/19/23 210 lb (95.3 kg)  11/24/22 208 lb (94.3 kg)  11/21/22 208 lb 2 oz (94.4 kg)   BP Readings from Last 3 Encounters:  03/19/23 126/80  11/24/22 120/68  11/21/22 (!) 140/80         Past Medical History:  Diagnosis Date   Abdominal pain, epigastric 04/11/2010   BELCHING 04/23/2010   BRADYCARDIA, CHRONIC 10/24/2006   CARPAL TUNNEL SYNDROME, BILATERAL 02/07/2009   CHEST PAIN-UNSPECIFIED 09/05/2008   CHOLELITHIASIS 04/22/2010   Cholelithiasis 06/13/2010   COLONIC POLYPS, HX OF 04/28/2007   DEGENERATIVE JOINT DISEASE, RIGHT KNEE 10/24/2006   Depression 02/23/2011   DIABETES MELLITUS, TYPE II 04/28/2007   Dizziness and giddiness 12/19/2009   Elevated PSA 06/13/2010   FATIGUE 04/28/2007   GERD  02/11/2009   Headache(784.0) 12/19/2009   HYPERLIPIDEMIA 04/28/2007   HYPERTENSION 10/21/2006   NECK MASS 02/07/2010   OBESITY 10/24/2006   OTITIS MEDIA, ACUTE, LEFT 12/26/2009   PHIMOSIS 02/07/2009   S/P laparoscopic cholecystectomy 10/31/2010   Thyroid  cancer (HCC) 06/13/2010   THYROID  NODULE 02/07/2010   Past Surgical History:  Procedure Laterality Date   CHOLECYSTECTOMY     left knee surgery     right wrist surgury     THYROID  SURGERY      reports that he has quit smoking. He has never used smokeless tobacco. He reports that he does not drink alcohol and does not use drugs. family history includes Arthritis in his mother; Breast cancer in his sister; Colon cancer (age of onset: 41) in his brother; Diabetes in his brother; Goiter in his mother; Heart attack (age of onset: 43) in his brother; Heart attack (age of onset: 9) in his father; Hypertension in his mother; Hypothyroidism in his sister. Allergies  Allergen Reactions   Diphenoxylate -Atropine  Other (See Comments)   Other Other (See Comments) and Hives    Causes body to ache Causes body to ache   Sulfa Antibiotics Other (See Comments)    As child almost died   Ace Inhibitors     REACTION: cough   Atenolol     REACTION: bradycardia  Codeine Other (See Comments)    Head spins wild   Lovastatin     REACTION: myalygros   Morphine Nausea And Vomiting   Morphine And Codeine Other (See Comments)   Nsaids     Heart patient   Statins Other (See Comments)    Causes body to ache   Sulfamethoxazole Other (See Comments)    Childhood unknown reaction   Sulfasalazine Other (See Comments)    As child almost died   Tramadol      Ants crawling all over   Current Outpatient Medications on File Prior to Visit  Medication Sig Dispense Refill   amiodarone  (PACERONE ) 200 MG tablet Take by mouth.     amLODipine  (NORVASC ) 2.5 MG tablet Take 1 tablet (2.5 mg total) by mouth daily. (Patient taking differently: Take 2.5 mg by mouth at  bedtime.) 90 tablet 3   apixaban  (ELIQUIS ) 5 MG TABS tablet Take 2.5 mg by mouth 2 (two) times daily.     Ascorbic Acid (VITAMIN C) 1000 MG tablet Take 1,000 mg by mouth daily.     augmented betamethasone  dipropionate (DIPROLENE -AF) 0.05 % ointment betamethasone , augmented 0.05 % topical ointment  APPLY OINTMENT TOPICALLY TO EACH EAR AS NEEDED FOR ITCHING     Calcium  Carb-Cholecalciferol [phone] MG-UNIT CAPS Take by mouth.     ciclopirox (PENLAC) 8 % solution      Cinnamon 500 MG capsule Take 500 mg by mouth 2 (two) times daily.     Continuous Glucose Receiver (DEXCOM G7 RECEIVER) DEVI Use as directed once daily E11.9 1 each 0   Continuous Glucose Sensor (DEXCOM G7 SENSOR) MISC Use as directed once every 10 days E11.9 9 each 3   cyanocobalamin  1000 MCG tablet Take 1,000 mcg by mouth daily.     ezetimibe  (ZETIA ) 10 MG tablet Take 1 tablet (10 mg total) by mouth daily. 90 tablet 3   Flaxseed, Linseed, 1000 MG CAPS Take 1 capsule by mouth daily.     Garlic Oil 500 MG TABS Take by mouth 2 (two) times daily.     Ginger, Zingiber officinalis, (GINGER PO) Take 1 tablet by mouth in the morning and at bedtime.     Glucosamine-Chondroit-Vit C-Mn (GLUCOSAMINE CHONDROITIN COMPLX) CAPS Take by mouth daily.     Glucosamine-Chondroitin 250-200 MG TABS Take 1 tablet by mouth at bedtime.     hydrocortisone 2.5 % ointment Apply 1 Application topically 2 (two) times daily.     iron  polysaccharides (NU-IRON ) 150 MG capsule Take 1 capsule (150 mg total) by mouth daily. 90 capsule 1   isosorbide  mononitrate (IMDUR ) 60 MG 24 hr tablet Take by mouth.     Lancets MISC Use as directed three times per day E11.9 300 each 3   levothyroxine  (SYNTHROID ) 112 MCG tablet Take 1 tablet (112 mcg total) by mouth daily. 90 tablet 3   losartan  (COZAAR ) 100 MG tablet Take 1 tablet (100 mg total) by mouth daily. 90 tablet 3   metoprolol  succinate (TOPROL -XL) 25 MG 24 hr tablet 12.5 mg daily.     Multiple Vitamin (MULTIVITAMIN)  capsule Take 1 capsule by mouth daily.     nitroGLYCERIN  (NITROSTAT ) 0.4 MG SL tablet 0.4 mg every 5 (five) minutes as needed for chest pain.     Omega-3 Fatty Acids (FISH OIL) 1000 MG CAPS Take by mouth 2 (two) times daily.     pantoprazole  (PROTONIX ) 40 MG tablet Take 1 tablet (40 mg total) by mouth 2 (two) times daily. 60 tablet 5  prasugrel  (EFFIENT ) 10 MG TABS tablet Take 10 mg by mouth daily.     triamcinolone  (KENALOG ) 0.025 % cream Apply topically.     VITAMIN D , CHOLECALCIFEROL, PO Take by mouth daily.     Diclofenac  Sodium (PENNSAID ) 2 % SOLN      No current facility-administered medications on file prior to visit.        ROS:  All others reviewed and negative.  Objective        PE:  BP 126/80 (BP Location: Left Arm, Patient Position: Sitting, Cuff Size: Normal)   Pulse (!) 57   Temp 98.1 F (36.7 C) (Oral)   Ht 6' (1.829 m)   Wt 210 lb (95.3 kg)   SpO2 97%   BMI 28.48 kg/m                 Constitutional: Pt appears mild ill               HENT: Head: NCAT.                Right Ear: External ear normal.                 Left Ear: External ear normal. Bilat tm's with mild erythema.  Max sinus areas mild tender.  Pharynx with mild erythema, no exudate                Eyes: . Pupils are equal, round, and reactive to light. Conjunctivae and EOM are normal               Nose: without d/c or deformity               Neck: Neck supple. Gross normal ROM               Cardiovascular: Normal rate and regular rhythm.                 Pulmonary/Chest: Effort normal and breath sounds without rales or wheezing.                Abd:  Soft, NT, ND, + BS, no organomegaly               Neurological: Pt is alert. At baseline orientation, motor grossly intact               Skin: Skin is warm. No rashes, no other new lesions, LE edema - none               Psychiatric: Pt behavior is normal without agitation   Micro: none  Cardiac tracings I have personally interpreted today:   none  Pertinent Radiological findings (summarize): none   Lab Results  Component Value Date   WBC 7.3 11/21/2022   HGB 11.5 (L) 11/21/2022   HCT 36.4 (L) 11/21/2022   PLT 334.0 11/21/2022   GLUCOSE 113 (H) 11/21/2022   CHOL 136 10/30/2022   TRIG 173.0 (H) 10/30/2022   HDL 40.00 10/30/2022   LDLDIRECT 80.0 03/18/2022   LDLCALC 61 10/30/2022   ALT 21 11/09/2022   AST 23 11/09/2022   NA 139 11/21/2022   K 4.4 11/21/2022   CL 107 11/21/2022   CREATININE 1.87 (H) 11/21/2022   BUN 28 (H) 11/21/2022   CO2 25 11/21/2022   TSH 8.272 (H) 11/09/2022   PSA 3.59 05/08/2017   INR 1.0 08/22/2008   HGBA1C 6.4 10/30/2022   MICROALBUR 1.8 09/24/2022   Assessment/Plan:  Nathan Russo is a  83 y.o. White or Caucasian [1] male with  has a past medical history of Abdominal pain, epigastric (04/11/2010), BELCHING (04/23/2010), BRADYCARDIA, CHRONIC (10/24/2006), CARPAL TUNNEL SYNDROME, BILATERAL (02/07/2009), CHEST PAIN-UNSPECIFIED (09/05/2008), CHOLELITHIASIS (04/22/2010), Cholelithiasis (06/13/2010), COLONIC POLYPS, HX OF (04/28/2007), DEGENERATIVE JOINT DISEASE, RIGHT KNEE (10/24/2006), Depression (02/23/2011), DIABETES MELLITUS, TYPE II (04/28/2007), Dizziness and giddiness (12/19/2009), Elevated PSA (06/13/2010), FATIGUE (04/28/2007), GERD (02/11/2009), Headache(784.0) (12/19/2009), HYPERLIPIDEMIA (04/28/2007), HYPERTENSION (10/21/2006), NECK MASS (02/07/2010), OBESITY (10/24/2006), OTITIS MEDIA, ACUTE, LEFT (12/26/2009), PHIMOSIS (02/07/2009), S/P laparoscopic cholecystectomy (10/31/2010), Thyroid  cancer (HCC) (06/13/2010), and THYROID  NODULE (02/07/2010).  Diabetes mellitus (HCC) Lab Results  Component Value Date   HGBA1C 6.4 10/30/2022   Stable, pt to continue current medical treatment  - diet, wt controll ok  for new glucometer Guide Me   Essential hypertension BP Readings from Last 3 Encounters:  03/19/23 126/80  11/24/22 120/68  11/21/22 (!) 140/80   Stable, pt to continue medical treatment norvasc  2.5 every  day, losartan  100 every day, toprol  xl 12.5 qd   BPH (benign prostatic hyperplasia) With mild worsening symptoms slow weak stream, for flomax  0.4 mg qd  Acute upper respiratory infection Mild to mod, for antibx course levaquin  500 every day,,  to f/u any worsening symptoms or concerns  Followup: Return in about 2 weeks (around 04/02/2023).  Lynwood Rush, MD 03/21/2023 11:02 AM [facility]

## 2023-03-21 ENCOUNTER — Encounter: Payer: Self-pay | Admitting: Internal Medicine

## 2023-03-21 DIAGNOSIS — N400 Enlarged prostate without lower urinary tract symptoms: Secondary | ICD-10-CM | POA: Insufficient documentation

## 2023-03-21 DIAGNOSIS — J069 Acute upper respiratory infection, unspecified: Secondary | ICD-10-CM | POA: Insufficient documentation

## 2023-03-21 NOTE — Patient Instructions (Signed)
 Please take all new medication as prescribed - the antibiotic, and flomax   Please continue all other medications as before, and refills have been done if requested.  Please have the pharmacy call with any other refills you may need.  Please continue your efforts at being more active, low cholesterol diet, and weight control.  Please keep your appointments with your specialists as you may have planned

## 2023-03-21 NOTE — Assessment & Plan Note (Signed)
 With mild worsening symptoms slow weak stream, for flomax 0.4 mg qd

## 2023-03-21 NOTE — Assessment & Plan Note (Signed)
 BP Readings from Last 3 Encounters:  03/19/23 126/80  11/24/22 120/68  11/21/22 (!) 140/80   Stable, pt to continue medical treatment norvasc 2.5 every day, losartan 100 every day, toprol xl 12.5 qd

## 2023-03-21 NOTE — Assessment & Plan Note (Signed)
 Mild to mod, for antibx course levaquin 500 every day,,  to f/u any worsening symptoms or concerns

## 2023-03-21 NOTE — Assessment & Plan Note (Signed)
 Lab Results  Component Value Date   HGBA1C 6.4 10/30/2022   Stable, pt to continue current medical treatment  - diet, wt controll ok  for new glucometer Guide Me

## 2023-03-23 DIAGNOSIS — M545 Low back pain, unspecified: Secondary | ICD-10-CM | POA: Insufficient documentation

## 2023-03-25 ENCOUNTER — Other Ambulatory Visit: Payer: Self-pay | Admitting: *Deleted

## 2023-03-25 MED ORDER — PANTOPRAZOLE SODIUM 40 MG PO TBEC: 40.0000 mg | DELAYED_RELEASE_TABLET | Freq: Two times a day (BID) | ORAL | 2 refills | Status: DC

## 2023-03-27 ENCOUNTER — Ambulatory Visit (INDEPENDENT_AMBULATORY_CARE_PROVIDER_SITE_OTHER): Payer: Medicare Other

## 2023-03-27 VITALS — BP 130/70 | HR 52 | Ht 70.0 in | Wt 210.0 lb

## 2023-03-27 DIAGNOSIS — Z0000 Encounter for general adult medical examination without abnormal findings: Secondary | ICD-10-CM | POA: Diagnosis not present

## 2023-03-27 NOTE — Patient Instructions (Signed)
Nathan Russo , Thank you for taking time to come for your Medicare Wellness Visit. I appreciate your ongoing commitment to your health goals. Please review the following plan we discussed and let me know if I can assist you in the future.   Referrals/Orders/Follow-Ups/Clinician Recommendations: It was a pleasure meeting you today.  Keep up the good work.  This is a list of the screening recommended for you and due dates:  Health Maintenance  Topic Date Due   Zoster (Shingles) Vaccine (1 of 2) Never done   COVID-19 Vaccine (3 - Pfizer risk series) 06/28/2019   Yearly kidney health urinalysis for diabetes  09/24/2023   Complete foot exam   09/24/2023   Yearly kidney function blood test for diabetes  11/21/2023   Eye exam for diabetics  02/23/2024   Medicare Annual Wellness Visit  03/26/2024   DTaP/Tdap/Td vaccine (4 - Td or Tdap) 02/06/2030   Pneumonia Vaccine  Completed   Flu Shot  Completed   HPV Vaccine  Aged Out    Advanced directives: (Copy Requested) Please bring a copy of your health care power of attorney and living will to the office to be added to your chart at your convenience.  Next Medicare Annual Wellness Visit scheduled for next year: Yes

## 2023-03-27 NOTE — Progress Notes (Signed)
Subjective:   Nathan Russo is a 83 y.o. male who presents for Medicare Annual/Subsequent preventive examination.  Visit Complete: In person    Cardiac Risk Factors include: advanced age (>46men, >26 women);male gender;Other (see comment);hypertension, Risk factor comments: A-fib, PAT, OSA     Objective:    Today's Vitals   03/27/23 1328  BP: 130/70  Pulse: (!) 52  SpO2: 97%  Weight: 210 lb (95.3 kg)  Height: 5\' 10"  (1.778 m)  PainSc: 4    Body mass index is 30.13 kg/m.     03/27/2023    1:49 PM 04/16/2022    1:51 PM 04/03/2022    2:38 PM 03/22/2021    3:26 PM 11/02/2019    3:44 PM 10/19/2018    3:44 PM 10/09/2017    2:49 PM  Advanced Directives  Does Patient Have a Medical Advance Directive? Yes Yes Yes Yes Yes Yes Yes  Type of Estate agent of [place];Living will Healthcare Power of [place];Out of facility DNR (pink MOST or yellow form);Living will Healthcare Power of [place];Living will Living will;Healthcare Power of Attorney Living will;Healthcare Power of [employer] Power of [place];Living will Healthcare Power of [place];Living will  Does patient want to make changes to medical advance directive?    No - Patient declined No - Patient declined    Copy of Healthcare Power of Attorney in Chart? No - copy requested  No - copy requested No - copy requested No - copy requested No - copy requested No - copy requested    Current Medications (verified) Outpatient Encounter Medications as of 03/27/2023  Medication Sig   amiodarone (PACERONE) 200 MG tablet Take by mouth.   amLODipine (NORVASC) 2.5 MG tablet Take 1 tablet (2.5 mg total) by mouth daily. (Patient taking differently: Take 2.5 mg by mouth at bedtime.)   apixaban (ELIQUIS) 5 MG TABS tablet Take 2.5 mg by mouth 2 (two) times daily.   Ascorbic Acid (VITAMIN C) 1000 MG tablet Take 1,000 mg by mouth daily.   augmented betamethasone dipropionate (DIPROLENE-AF) 0.05 % ointment  betamethasone, augmented 0.05 % topical ointment  APPLY OINTMENT TOPICALLY TO EACH EAR AS NEEDED FOR ITCHING   Blood Glucose Calibration (ACCU-CHEK GUIDE CONTROL) LIQD Use as directed once daily as needed   Blood Glucose Monitoring Suppl (ACCU-CHEK GUIDE ME) w/Device KIT Use as directed once daily E11.9   Calcium Carb-Cholecalciferol [phone] MG-UNIT CAPS Take by mouth.   ciclopirox (PENLAC) 8 % solution    Cinnamon 500 MG capsule Take 500 mg by mouth 2 (two) times daily.   Continuous Glucose Receiver (DEXCOM G7 RECEIVER) DEVI Use as directed once daily E11.9   Continuous Glucose Sensor (DEXCOM G7 SENSOR) MISC Use as directed once every 10 days E11.9   cyanocobalamin 1000 MCG tablet Take 1,000 mcg by mouth daily.   ezetimibe (ZETIA) 10 MG tablet Take 1 tablet (10 mg total) by mouth daily.   Flaxseed, Linseed, 1000 MG CAPS Take 1 capsule by mouth daily.   Garlic Oil 500 MG TABS Take by mouth 2 (two) times daily.   Ginger, Zingiber officinalis, (GINGER PO) Take 1 tablet by mouth in the morning and at bedtime.   Glucosamine-Chondroit-Vit C-Mn (GLUCOSAMINE CHONDROITIN COMPLX) CAPS Take by mouth daily.   Glucosamine-Chondroitin 250-200 MG TABS Take 1 tablet by mouth at bedtime.   glucose blood (ACCU-CHEK GUIDE TEST) test strip Use as instructed 1 every day E11.9   hydrocortisone 2.5 % ointment Apply 1 Application topically 2 (two) times daily.  iron polysaccharides (NU-IRON) 150 MG capsule Take 1 capsule (150 mg total) by mouth daily.   isosorbide mononitrate (IMDUR) 60 MG 24 hr tablet Take by mouth.   Lancets MISC Use as directed three times per day E11.9   levofloxacin (LEVAQUIN) 500 MG tablet Take 1 tablet (500 mg total) by mouth daily.   levothyroxine (SYNTHROID) 112 MCG tablet Take 1 tablet (112 mcg total) by mouth daily.   losartan (COZAAR) 100 MG tablet Take 1 tablet (100 mg total) by mouth daily.   metoprolol succinate (TOPROL-XL) 25 MG 24 hr tablet 12.5 mg daily.   Multiple Vitamin  (MULTIVITAMIN) capsule Take 1 capsule by mouth daily.   nitroGLYCERIN (NITROSTAT) 0.4 MG SL tablet 0.4 mg every 5 (five) minutes as needed for chest pain.   Omega-3 Fatty Acids (FISH OIL) 1000 MG CAPS Take by mouth 2 (two) times daily.   pantoprazole (PROTONIX) 40 MG tablet Take 1 tablet (40 mg total) by mouth 2 (two) times daily.   prasugrel (EFFIENT) 10 MG TABS tablet Take 10 mg by mouth daily.   triamcinolone (KENALOG) 0.025 % cream Apply topically.   VITAMIN D, CHOLECALCIFEROL, PO Take by mouth daily.   Diclofenac Sodium (PENNSAID) 2 % SOLN    tamsulosin (FLOMAX) 0.4 MG CAPS capsule Take 1 capsule (0.4 mg total) by mouth daily. (Patient not taking: Reported on 03/27/2023)   No facility-administered encounter medications on file as of 03/27/2023.    Allergies (verified) Diphenoxylate-atropine, Other, Sulfa antibiotics, Ace inhibitors, Atenolol, Codeine, Lovastatin, Morphine, Morphine and codeine, Nsaids, Statins, Sulfamethoxazole, Sulfasalazine, and Tramadol   History: Past Medical History:  Diagnosis Date   Abdominal pain, epigastric 04/11/2010   BELCHING 04/23/2010   BRADYCARDIA, CHRONIC 10/24/2006   CARPAL TUNNEL SYNDROME, BILATERAL 02/07/2009   CHEST PAIN-UNSPECIFIED 09/05/2008   CHOLELITHIASIS 04/22/2010   Cholelithiasis 06/13/2010   COLONIC POLYPS, HX OF 04/28/2007   DEGENERATIVE JOINT DISEASE, RIGHT KNEE 10/24/2006   Depression 02/23/2011   DIABETES MELLITUS, TYPE II 04/28/2007   Dizziness and giddiness 12/19/2009   Elevated PSA 06/13/2010   FATIGUE 04/28/2007   GERD 02/11/2009   Headache(784.0) 12/19/2009   HYPERLIPIDEMIA 04/28/2007   HYPERTENSION 10/21/2006   NECK MASS 02/07/2010   OBESITY 10/24/2006   OTITIS MEDIA, ACUTE, LEFT 12/26/2009   PHIMOSIS 02/07/2009   S/P laparoscopic cholecystectomy 10/31/2010   Thyroid cancer (HCC) 06/13/2010   THYROID NODULE 02/07/2010   Past Surgical History:  Procedure Laterality Date   CHOLECYSTECTOMY     left knee surgery     right wrist surgury      THYROID SURGERY     Family History  Problem Relation Age of Onset   Hypertension Mother    Arthritis Mother    Goiter Mother    Heart attack Father 42   Hypothyroidism Sister    Breast cancer Sister    Heart attack Brother 30   Diabetes Brother    Colon cancer Brother 65   Social History   Socioeconomic History   Marital status: Married    Spouse name: Renea Ee   Number of children: 3   Years of education: Not on file   Highest education level: Bachelor's degree (e.g., BA, AB, BS)  Occupational History   Occupation: former Doctor, general practice: RETIRED  Tobacco Use   Smoking status: Former   Smokeless tobacco: Never  Advertising account planner   Vaping status: Never Used  Substance and Sexual Activity   Alcohol use: No    Alcohol/week: 0.0 standard drinks  of alcohol   Drug use: No   Sexual activity: Not Currently  Other Topics Concern   Not on file  Social History Narrative   Are you right handed or left handed? Right Handed   Are you currently employed ?    What is your current occupation?   Do you live at home alone? No   Who lives with you? Lives with wife.    What type of home do you live in: 1 story or 2 story? Lives in a two story home with a stair lift        Social Drivers of Health   Financial Resource Strain: Low Risk  (03/27/2023)   Overall Financial Resource Strain (CARDIA)    Difficulty of Paying Living Expenses: Not hard at all  Food Insecurity: No Food Insecurity (03/27/2023)   Hunger Vital Sign    Worried About Running Out of Food in the Last Year: Never true    Ran Out of Food in the Last Year: Never true  Transportation Needs: No Transportation Needs (03/27/2023)   PRAPARE - Administrator, Civil Service (Medical): No    Lack of Transportation (Non-Medical): No  Physical Activity: Insufficiently Active (03/27/2023)   Exercise Vital Sign    Days of Exercise per Week: 7 days    Minutes of Exercise per Session: 20 min   Stress: No Stress Concern Present (03/27/2023)   [employer] of Occupational Health - Occupational Stress Questionnaire    Feeling of Stress : Not at all  Recent Concern: Stress - Stress Concern Present (03/18/2023)   [employer] of Occupational Health - Occupational Stress Questionnaire    Feeling of Stress : To some extent  Social Connections: Socially Integrated (03/27/2023)   Social Connection and Isolation Panel [NHANES]    Frequency of Communication with Friends and Family: More than three times a week    Frequency of Social Gatherings with Friends and Family: Once a week    Attends Religious Services: More than 4 times per year    Active Member of [employer] or Organizations: Yes    Attends Banker Meetings: Never    Marital Status: Married    Tobacco Counseling Counseling given: Not Answered   Clinical Intake:  Pre-visit preparation completed: Yes  Pain : 0-10 Pain Score: 4  Pain Type: Acute pain Pain Location: Knee (lower back) Pain Orientation: Left (Osteoarthritis of left knee) Pain Descriptors / Indicators: Aching, Discomfort Pain Onset: More than a month ago Pain Frequency: Constant Pain Relieving Factors: Tylenol Effect of Pain on Daily Activities: standing and walking  Pain Relieving Factors: Tylenol  BMI - recorded: 30.13 Nutritional Risks: None Diabetes: No  How often do you need to have someone help you when you read instructions, pamphlets, or other written materials from your doctor or pharmacy?: 1 - Never  Interpreter Needed?: No  Information entered by :: Mandalyn Pasqua, RMA   Activities of Daily Living    03/27/2023    1:29 PM 04/03/2022    3:06 PM  In your present state of health, do you have any difficulty performing the following activities:  Hearing? 1 1  Comment  going for testing 04/09/2022  Vision? 0 0  Difficulty concentrating or making decisions? 0 0  Walking or climbing stairs? 0 0  Dressing or bathing? 0 0   Doing errands, shopping? 0 0  Preparing Food and eating ? N N  Using the Toilet? N N  In the past six months,  have you accidently leaked urine? Y N  Do you have problems with loss of bowel control? N N  Managing your Medications? N N  Managing your Finances? N N  Housekeeping or managing your Housekeeping? N N    Patient Care Team: Corwin Levins, MD as PCP - General Elfredia Nevins, MD as Referring Physician (Plastic Surgery) Gayla Doss, MD as Referring Physician (Cardiology) [place], Dede Query, OD (Optometry) Glendale Chard, DO as Consulting Physician (Neurology)  Indicate any recent Medical Services you may have received from other than [facility] providers in the past year (date may be approximate).     Assessment:   This is a routine wellness examination for Sara.  Hearing/Vision screen Hearing Screening - Comments:: has hearing aides Vision Screening - Comments:: Wears eyeglasses   Goals Addressed   None   Depression Screen    03/27/2023    2:04 PM 11/24/2022    3:46 PM 11/11/2022    3:43 PM 11/06/2022    3:40 PM 10/30/2022    4:01 PM 10/17/2022    3:46 PM 09/24/2022    7:59 AM  PHQ 2/9 Scores  PHQ - 2 Score 3 0 0 0 0 0 3  PHQ- 9 Score 7          Fall Risk    03/27/2023    1:49 PM 03/19/2023    3:30 PM 11/24/2022    3:46 PM 11/21/2022    1:59 PM 11/11/2022    3:42 PM  Fall Risk   Falls in the past year? 0 0 0 0 0  Number falls in past yr: 0 0 0 0 0  Injury with Fall? 0 0 0 0 0  Risk for fall due to : No Fall Risks  No Fall Risks No Fall Risks No Fall Risks  Follow up Falls prevention discussed;Falls evaluation completed Falls evaluation completed Falls evaluation completed Falls evaluation completed Falls evaluation completed    MEDICARE RISK AT HOME: Medicare Risk at Home Any stairs in or around the home?: Yes If so, are there any without handrails?: Yes Home free of loose throw rugs in walkways, pet beds, electrical cords, etc?: Yes Adequate lighting in  your home to reduce risk of falls?: Yes Life alert?: Yes Use of a cane, walker or w/c?: Yes Grab bars in the bathroom?: Yes Shower chair or bench in shower?: Yes Elevated toilet seat or a handicapped toilet?: Yes  TIMED UP AND GO:  Was the test performed?  Yes  Length of time to ambulate 10 feet: 20 sec Gait slow and steady with assistive device    Cognitive Function:        03/27/2023    1:29 PM 04/03/2022    3:07 PM  6CIT Screen  What Year? 0 points 0 points  What month? 0 points 0 points  What time? 0 points 0 points  Count back from 20 0 points 0 points  Months in reverse 0 points 0 points  Repeat phrase 0 points 0 points  Total Score 0 points 0 points    Immunizations Immunization History  Administered Date(s) Administered   Fluad Quad(high Dose 65+) 12/13/2018, 02/07/2020   Fluad Trivalent(High Dose 65+) 12/04/2022   Influenza Split 12/13/2010, 12/22/2012   Influenza Whole 12/09/2007, 12/13/2009   Influenza, High Dose Seasonal PF 12/10/2014, 11/04/2016, 11/10/2017   Influenza, Seasonal, Injecte, Preservative Fre 12/08/2012, 11/16/2014   Influenza,inj,Quad PF,6+ Mos 11/07/2015   Influenza-Unspecified 12/08/2012, 02/06/2021, 12/23/2021   PFIZER(Purple Top)SARS-COV-2 Vaccination 04/03/2019, 05/31/2019  Pneumococcal Conjugate-13 03/22/2013   Pneumococcal Polysaccharide-23 01/08/2006, 03/25/2011   RSV,unspecified 12/23/2021   Td 05/17/2009   Td (Adult),5 Lf Tetanus Toxid, Preservative Free 05/17/2009   Tdap 02/07/2020    TDAP status: Up to date  Flu Vaccine status: Up to date  Pneumococcal vaccine status: Up to date  Covid-19 vaccine status: Declined, Education has been provided regarding the importance of this vaccine but patient still declined. Advised may receive this vaccine at local pharmacy or Health Dept.or vaccine clinic. Aware to provide a copy of the vaccination record if obtained from local pharmacy or Health Dept. Verbalized acceptance and  understanding.  Qualifies for Shingles Vaccine? Yes   Zostavax completed  Declines   Shingrix Completed?: Yes  Screening Tests Health Maintenance  Topic Date Due   Zoster Vaccines- Shingrix (1 of 2) Never done   COVID-19 Vaccine (3 - Pfizer risk series) 06/28/2019   Diabetic kidney evaluation - Urine ACR  09/24/2023   FOOT EXAM  09/24/2023   Diabetic kidney evaluation - eGFR measurement  11/21/2023   OPHTHALMOLOGY EXAM  02/23/2024   Medicare Annual Wellness (AWV)  03/26/2024   DTaP/Tdap/Td (4 - Td or Tdap) 02/06/2030   Pneumonia Vaccine 92+ Years old  Completed   INFLUENZA VACCINE  Completed   HPV VACCINES  Aged Out    Health Maintenance  Health Maintenance Due  Topic Date Due   Zoster Vaccines- Shingrix (1 of 2) Never done   COVID-19 Vaccine (3 - Pfizer risk series) 06/28/2019    Colorectal cancer screening: Type of screening: FOBT/FIT. Completed 12/30/2022. Repeat every 1 years  Lung Cancer Screening: (Low Dose CT Chest recommended if Age 71-80 years, 20 pack-year currently smoking OR have quit w/in 15years.) does not qualify.   Lung Cancer Screening Referral: N/A  Additional Screening:  Hepatitis C Screening: does not qualify  Vision Screening: Recommended annual ophthalmology exams for early detection of glaucoma and other disorders of the eye. Is the patient up to date with their annual eye exam?  Yes  Who is the provider or what is the name of the office in which the patient attends annual eye exams? Dr. Massie Kluver If pt is not established with a provider, would they like to be referred to a provider to establish care? Yes .   Dental Screening: Recommended annual dental exams for proper oral hygiene  Diabetic Foot Exam: Diabetic Foot Exam: Completed 09/24/2022  Community Resource Referral / Chronic Care Management: CRR required this visit?  No   CCM required this visit?  No     Plan:     I have personally reviewed and noted the following in the patient's  chart:   Medical and social history Use of alcohol, tobacco or illicit drugs  Current medications and supplements including opioid prescriptions. Patient is not currently taking opioid prescriptions. Functional ability and status Nutritional status Physical activity Advanced directives List of other physicians Hospitalizations, surgeries, and ER visits in previous 12 months Vitals Screenings to include cognitive, depression, and falls Referrals and appointments  In addition, I have reviewed and discussed with patient certain preventive protocols, quality metrics, and best practice recommendations. A written personalized care plan for preventive services as well as general preventive health recommendations were provided to patient.     Betina Puckett L Ovadia Lopp, CMA   03/27/2023   After Visit Summary: (MyChart) Due to this being a telephonic visit, the after visit summary with patients personalized plan was offered to patient via MyChart   Nurse Notes: Patient informed  me that he stopped taking the Tramadol x 3 days due to him having palpitations when he took it.  He is up to date on health maintenance.  Patient had no other concerns to address today.

## 2023-03-30 ENCOUNTER — Ambulatory Visit: Payer: Medicare (Managed Care) | Admitting: Internal Medicine

## 2023-03-31 ENCOUNTER — Telehealth: Payer: Self-pay | Admitting: Internal Medicine

## 2023-03-31 NOTE — Telephone Encounter (Signed)
Copied from CRM [id number]. Topic: Clinical - Medication Refill >> Mar 31, 2023  2:37 PM [place] wrote: Reason for CRM:    Blood Glucose Monitoring Suppl (ACCU-CHEK GUIDE ME) w/Device KIT [[id number]] this was sent to the Pharmacy as 1 time a day. Patient was told to do it 4 times a day. Can it please be reentered and sent to pharmacy.... also...Marland KitchenMarland KitchenMarland Kitchen pantoprazole (PROTONIX) 40 MG tablet [[id number]]  please change to 90 day supply. It is too expensive for Patient with the 180 days.

## 2023-04-01 ENCOUNTER — Other Ambulatory Visit: Payer: Self-pay

## 2023-04-01 MED ORDER — ACCU-CHEK GUIDE TEST VI STRP: ORAL_STRIP | 12 refills | Status: AC

## 2023-04-01 MED ORDER — PANTOPRAZOLE SODIUM 40 MG PO TBEC: 40.0000 mg | DELAYED_RELEASE_TABLET | Freq: Two times a day (BID) | ORAL | 2 refills | Status: AC

## 2023-04-01 NOTE — Telephone Encounter (Signed)
No to both, since changing the instruction for the meter rx is irrelevant to actual use - pt should use as he mentions  No to protonix rx change since 180 pills IS the 90 day supply if he takes twice per day

## 2023-04-01 NOTE — Telephone Encounter (Signed)
Ok the strips are corrected, thanks

## 2023-04-02 ENCOUNTER — Encounter: Payer: Self-pay | Admitting: Internal Medicine

## 2023-04-02 ENCOUNTER — Ambulatory Visit (INDEPENDENT_AMBULATORY_CARE_PROVIDER_SITE_OTHER): Payer: Medicare Other | Admitting: Internal Medicine

## 2023-04-02 VITALS — BP 120/72 | HR 53 | Temp 98.0°F | Ht 70.0 in | Wt 212.0 lb

## 2023-04-02 DIAGNOSIS — I10 Essential (primary) hypertension: Secondary | ICD-10-CM

## 2023-04-02 DIAGNOSIS — N1832 Chronic kidney disease, stage 3b: Secondary | ICD-10-CM

## 2023-04-02 DIAGNOSIS — E890 Postprocedural hypothyroidism: Secondary | ICD-10-CM | POA: Diagnosis not present

## 2023-04-02 DIAGNOSIS — E559 Vitamin D deficiency, unspecified: Secondary | ICD-10-CM

## 2023-04-02 DIAGNOSIS — D509 Iron deficiency anemia, unspecified: Secondary | ICD-10-CM | POA: Diagnosis not present

## 2023-04-02 DIAGNOSIS — Z0001 Encounter for general adult medical examination with abnormal findings: Secondary | ICD-10-CM

## 2023-04-02 DIAGNOSIS — E538 Deficiency of other specified B group vitamins: Secondary | ICD-10-CM | POA: Diagnosis not present

## 2023-04-02 DIAGNOSIS — E1140 Type 2 diabetes mellitus with diabetic neuropathy, unspecified: Secondary | ICD-10-CM | POA: Diagnosis not present

## 2023-04-02 DIAGNOSIS — Z0000 Encounter for general adult medical examination without abnormal findings: Secondary | ICD-10-CM | POA: Diagnosis not present

## 2023-04-02 DIAGNOSIS — E7800 Pure hypercholesterolemia, unspecified: Secondary | ICD-10-CM

## 2023-04-02 DIAGNOSIS — D508 Other iron deficiency anemias: Secondary | ICD-10-CM | POA: Diagnosis not present

## 2023-04-02 LAB — MICROALBUMIN / CREATININE URINE RATIO
Creatinine,U: 108.9 mg/dL
Microalb Creat Ratio: 0.6 mg/g (ref 0.0–30.0)
Microalb, Ur: <0.7 mg/dL (ref 0.0–1.9)

## 2023-04-02 LAB — URINALYSIS, ROUTINE W REFLEX MICROSCOPIC
Bilirubin Urine: NEGATIVE
Hgb urine dipstick: NEGATIVE
Leukocytes,Ua: NEGATIVE
Nitrite: NEGATIVE
RBC / HPF: NONE SEEN (ref 0–?)
Specific Gravity, Urine: 1.025 (ref 1.000–1.030)
Total Protein, Urine: NEGATIVE
Urine Glucose: NEGATIVE
Urobilinogen, UA: 0.2 (ref 0.0–1.0)
pH: 5.5 (ref 5.0–8.0)

## 2023-04-02 NOTE — Patient Instructions (Signed)

## 2023-04-02 NOTE — Progress Notes (Unsigned)
Patient ID: Nathan Russo, male   DOB: 10/20/1940, 83 y.o.   MRN: [id number]         Chief Complaint:: wellness exam and iron deficiency anemia, ckd3a, dm, htn, hld, low thyroid       HPI:  Nathan Russo is a 83 y.o. male here for wellness exam; for shingrix at pharmacy, declines covid booster, o/w up to date                        Also Pt denies chest pain, increased sob or doe, wheezing, orthopnea, PND, increased LE swelling, palpitations, dizziness or syncope.   Pt denies polydipsia, polyuria, or new focal neuro s/s.   Pt denies fever, wt loss, night sweats, loss of appetite, or other constitutional symptoms No over bleeding, bruising   Wt Readings from Last 3 Encounters:  04/02/23 212 lb (96.2 kg)  03/27/23 210 lb (95.3 kg)  03/19/23 210 lb (95.3 kg)   BP Readings from Last 3 Encounters:  04/02/23 120/72  03/27/23 130/70  03/19/23 126/80   Immunization History  Administered Date(s) Administered   Fluad Quad(high Dose 65+) 12/13/2018, 02/07/2020   Fluad Trivalent(High Dose 65+) 12/04/2022   Influenza Split 12/13/2010, 12/22/2012   Influenza Whole 12/09/2007, 12/13/2009   Influenza, High Dose Seasonal PF 12/10/2014, 11/04/2016, 11/10/2017   Influenza, Seasonal, Injecte, Preservative Fre 12/08/2012, 11/16/2014   Influenza,inj,Quad PF,6+ Mos 11/07/2015   Influenza-Unspecified 12/08/2012, 02/06/2021, 12/23/2021   PFIZER(Purple Top)SARS-COV-2 Vaccination 04/03/2019, 05/31/2019   Pneumococcal Conjugate-13 03/22/2013   Pneumococcal Polysaccharide-23 01/08/2006, 03/25/2011   RSV,unspecified 12/23/2021   Td 05/17/2009   Td (Adult),5 Lf Tetanus Toxid, Preservative Free 05/17/2009   Tdap 02/07/2020   Health Maintenance Due  Topic Date Due   Zoster Vaccines- Shingrix (1 of 2) Never done      Past Medical History:  Diagnosis Date   Abdominal pain, epigastric 04/11/2010   BELCHING 04/23/2010   BRADYCARDIA, CHRONIC 10/24/2006   CARPAL TUNNEL SYNDROME, BILATERAL 02/07/2009   CHEST  PAIN-UNSPECIFIED 09/05/2008   CHOLELITHIASIS 04/22/2010   Cholelithiasis 06/13/2010   COLONIC POLYPS, HX OF 04/28/2007   DEGENERATIVE JOINT DISEASE, RIGHT KNEE 10/24/2006   Depression 02/23/2011   DIABETES MELLITUS, TYPE II 04/28/2007   Dizziness and giddiness 12/19/2009   Elevated PSA 06/13/2010   FATIGUE 04/28/2007   GERD 02/11/2009   Headache(784.0) 12/19/2009   HYPERLIPIDEMIA 04/28/2007   HYPERTENSION 10/21/2006   NECK MASS 02/07/2010   OBESITY 10/24/2006   OTITIS MEDIA, ACUTE, LEFT 12/26/2009   PHIMOSIS 02/07/2009   S/P laparoscopic cholecystectomy 10/31/2010   Thyroid cancer (HCC) 06/13/2010   THYROID NODULE 02/07/2010   Past Surgical History:  Procedure Laterality Date   CHOLECYSTECTOMY     left knee surgery     right wrist surgury     THYROID SURGERY      reports that he has quit smoking. He has never used smokeless tobacco. He reports that he does not drink alcohol and does not use drugs. family history includes Arthritis in his mother; Breast cancer in his sister; Colon cancer (age of onset: 71) in his brother; Diabetes in his brother; Goiter in his mother; Heart attack (age of onset: 59) in his brother; Heart attack (age of onset: 32) in his father; Hypertension in his mother; Hypothyroidism in his sister. Allergies  Allergen Reactions   Diphenoxylate-Atropine Other (See Comments)   Other Other (See Comments) and Hives    Causes body to ache Causes body to ache   Sulfa  Antibiotics Other (See Comments)    As child almost died   Ace Inhibitors     REACTION: cough   Atenolol     REACTION: bradycardia   Codeine Other (See Comments)    Head spins "wild"   Lovastatin     REACTION: myalygros   Morphine Nausea And Vomiting   Morphine And Codeine Other (See Comments)   Nsaids     Heart patient   Statins Other (See Comments)    Causes body to ache   Sulfamethoxazole Other (See Comments)    Childhood unknown reaction   Sulfasalazine Other (See Comments)    As child almost died    Tramadol     Ants crawling all over   Current Outpatient Medications on File Prior to Visit  Medication Sig Dispense Refill   amiodarone (PACERONE) 200 MG tablet Take by mouth.     amLODipine (NORVASC) 2.5 MG tablet Take 1 tablet (2.5 mg total) by mouth daily. (Patient taking differently: Take 2.5 mg by mouth at bedtime.) 90 tablet 3   apixaban (ELIQUIS) 5 MG TABS tablet Take 2.5 mg by mouth 2 (two) times daily.     Ascorbic Acid (VITAMIN C) 1000 MG tablet Take 1,000 mg by mouth daily.     augmented betamethasone dipropionate (DIPROLENE-AF) 0.05 % ointment betamethasone, augmented 0.05 % topical ointment  APPLY OINTMENT TOPICALLY TO EACH EAR AS NEEDED FOR ITCHING     Blood Glucose Calibration (ACCU-CHEK GUIDE CONTROL) LIQD Use as directed once daily as needed 1 each 3   Blood Glucose Monitoring Suppl (ACCU-CHEK GUIDE ME) w/Device KIT Use as directed once daily E11.9 1 kit 1   Calcium Carb-Cholecalciferol [phone] MG-UNIT CAPS Take by mouth.     ciclopirox (PENLAC) 8 % solution      Cinnamon 500 MG capsule Take 500 mg by mouth 2 (two) times daily.     Continuous Glucose Receiver (DEXCOM G7 RECEIVER) DEVI Use as directed once daily E11.9 1 each 0   Continuous Glucose Sensor (DEXCOM G7 SENSOR) MISC Use as directed once every 10 days E11.9 9 each 3   cyanocobalamin 1000 MCG tablet Take 1,000 mcg by mouth daily.     ezetimibe (ZETIA) 10 MG tablet Take 1 tablet (10 mg total) by mouth daily. 90 tablet 3   Flaxseed, Linseed, 1000 MG CAPS Take 1 capsule by mouth daily.     Garlic Oil 500 MG TABS Take by mouth 2 (two) times daily.     Ginger, Zingiber officinalis, (GINGER PO) Take 1 tablet by mouth in the morning and at bedtime.     Glucosamine-Chondroit-Vit C-Mn (GLUCOSAMINE CHONDROITIN COMPLX) CAPS Take by mouth daily.     Glucosamine-Chondroitin 250-200 MG TABS Take 1 tablet by mouth at bedtime.     glucose blood (ACCU-CHEK GUIDE TEST) test strip Use as instructed 4 times every day E11.9 400 each  12   hydrocortisone 2.5 % ointment Apply 1 Application topically 2 (two) times daily.     isosorbide mononitrate (IMDUR) 60 MG 24 hr tablet Take by mouth.     Lancets MISC Use as directed three times per day E11.9 300 each 3   levofloxacin (LEVAQUIN) 500 MG tablet Take 1 tablet (500 mg total) by mouth daily. 10 tablet 0   losartan (COZAAR) 100 MG tablet Take 1 tablet (100 mg total) by mouth daily. 90 tablet 3   metoprolol succinate (TOPROL-XL) 25 MG 24 hr tablet 12.5 mg daily.     Multiple Vitamin (MULTIVITAMIN) capsule Take  1 capsule by mouth daily.     nitroGLYCERIN (NITROSTAT) 0.4 MG SL tablet 0.4 mg every 5 (five) minutes as needed for chest pain.     Omega-3 Fatty Acids (FISH OIL) 1000 MG CAPS Take by mouth 2 (two) times daily.     pantoprazole (PROTONIX) 40 MG tablet Take 1 tablet (40 mg total) by mouth 2 (two) times daily. 180 tablet 2   prasugrel (EFFIENT) 10 MG TABS tablet Take 10 mg by mouth daily.     triamcinolone (KENALOG) 0.025 % cream Apply topically.     VITAMIN D, CHOLECALCIFEROL, PO Take by mouth daily.     tamsulosin (FLOMAX) 0.4 MG CAPS capsule Take 1 capsule (0.4 mg total) by mouth daily. (Patient not taking: Reported on 04/02/2023) 90 capsule 3   No current facility-administered medications on file prior to visit.        ROS:  All others reviewed and negative.  Objective        PE:  BP 120/72 (BP Location: Right Arm, Patient Position: Sitting, Cuff Size: Normal)   Pulse (!) 53   Temp 98 F (36.7 C) (Oral)   Ht 5\' 10"  (1.778 m)   Wt 212 lb (96.2 kg)   SpO2 98%   BMI 30.42 kg/m                 Constitutional: Pt appears in NAD               HENT: Head: NCAT.                Right Ear: External ear normal.                 Left Ear: External ear normal.                Eyes: . Pupils are equal, round, and reactive to light. Conjunctivae and EOM are normal               Nose: without d/c or deformity               Neck: Neck supple. Gross normal ROM                Cardiovascular: Normal rate and regular rhythm.                 Pulmonary/Chest: Effort normal and breath sounds without rales or wheezing.                Abd:  Soft, NT, ND, + BS, no organomegaly               Neurological: Pt is alert. At baseline orientation, motor grossly intact               Skin: Skin is warm. No rashes, no other new lesions, LE edema - none               Psychiatric: Pt behavior is normal without agitation   Micro: none  Cardiac tracings I have personally interpreted today:  none  Pertinent Radiological findings (summarize): none   Lab Results  Component Value Date   WBC 7.5 04/02/2023   HGB 12.9 (L) 04/02/2023   HCT 38.3 (L) 04/02/2023   PLT 314.0 04/02/2023   GLUCOSE 115 (H) 04/02/2023   CHOL 135 04/02/2023   TRIG 230.0 (H) 04/02/2023   HDL 42.80 04/02/2023   LDLDIRECT 80.0 03/18/2022   LDLCALC 46 04/02/2023   ALT 17 04/02/2023   AST  22 04/02/2023   NA 141 04/02/2023   K 4.9 04/02/2023   CL 107 04/02/2023   CREATININE 1.69 (H) 04/02/2023   BUN 32 (H) 04/02/2023   CO2 25 04/02/2023   TSH 10.08 (H) 04/02/2023   PSA 3.59 05/08/2017   INR 1.0 08/22/2008   HGBA1C 6.5 04/02/2023   MICROALBUR <0.7 04/02/2023   Assessment/Plan:  Nathan Russo is a 83 y.o. White or Caucasian [1] male with  has a past medical history of Abdominal pain, epigastric (04/11/2010), BELCHING (04/23/2010), BRADYCARDIA, CHRONIC (10/24/2006), CARPAL TUNNEL SYNDROME, BILATERAL (02/07/2009), CHEST PAIN-UNSPECIFIED (09/05/2008), CHOLELITHIASIS (04/22/2010), Cholelithiasis (06/13/2010), COLONIC POLYPS, HX OF (04/28/2007), DEGENERATIVE JOINT DISEASE, RIGHT KNEE (10/24/2006), Depression (02/23/2011), DIABETES MELLITUS, TYPE II (04/28/2007), Dizziness and giddiness (12/19/2009), Elevated PSA (06/13/2010), FATIGUE (04/28/2007), GERD (02/11/2009), Headache(784.0) (12/19/2009), HYPERLIPIDEMIA (04/28/2007), HYPERTENSION (10/21/2006), NECK MASS (02/07/2010), OBESITY (10/24/2006), OTITIS MEDIA, ACUTE, LEFT  (12/26/2009), PHIMOSIS (02/07/2009), S/P laparoscopic cholecystectomy (10/31/2010), Thyroid cancer (HCC) (06/13/2010), and THYROID NODULE (02/07/2010).  Encounter for well adult exam with abnormal findings Age and sex appropriate education and counseling updated with regular exercise and diet Referrals for preventative services - none needed Immunizations addressed - for shingrix at pharmacy, declines covid booster Smoking counseling  - none needed Evidence for depression or other mood disorder - none significant Most recent labs reviewed. I have personally reviewed and have noted: 1) the patient's medical and social history 2) The patient's current medications and supplements 3) The patient's height, weight, and BMI have been recorded in the chart   CKD stage 3b, GFR 30-44 ml/min (HCC) Lab Results  Component Value Date   CREATININE 1.69 (H) 04/02/2023   Stable overall, cont to avoid nephrotoxins   Diabetes mellitus (HCC) Lab Results  Component Value Date   HGBA1C 6.5 04/02/2023   Stable, pt to continue current medical treatment   diet, wt control   Essential hypertension BP Readings from Last 3 Encounters:  04/02/23 120/72  03/27/23 130/70  03/19/23 126/80   Stable, pt to continue medical treatment norvasc 2.5 every day, losartan 100 every day, toprol xl 12.5 qd   Hyperlipidemia Lab Results  Component Value Date   LDLCALC 46 04/02/2023   Stable, pt to continue current statin zetia 10 qd   Iron deficiency anemia Mild persistent, for cont'd niferex 150 every day, f/u lab next visit  Postoperative hypothyroidism Mild to mod, for increased levothryoxine 125 every day, to f/u any worsening symptoms or concerns  Followup: Return in about 6 months (around 09/30/2023).  Oliver Barre, MD 04/06/2023 7:02 PM [facility] Internal Medicine

## 2023-04-03 LAB — CBC WITH DIFFERENTIAL/PLATELET
Basophils Absolute: 0.1 10*3/uL (ref 0.0–0.1)
Basophils Relative: 0.9 % (ref 0.0–3.0)
Eosinophils Absolute: 0.2 10*3/uL (ref 0.0–0.7)
Eosinophils Relative: 2.3 % (ref 0.0–5.0)
HCT: 38.3 % — ABNORMAL LOW (ref 39.0–52.0)
Hemoglobin: 12.9 g/dL — ABNORMAL LOW (ref 13.0–17.0)
Lymphocytes Relative: 20.2 % (ref 12.0–46.0)
Lymphs Abs: 1.5 10*3/uL (ref 0.7–4.0)
MCHC: 33.6 g/dL (ref 30.0–36.0)
MCV: 91.1 fL (ref 78.0–100.0)
Monocytes Absolute: 0.4 10*3/uL (ref 0.1–1.0)
Monocytes Relative: 5.7 % (ref 3.0–12.0)
Neutro Abs: 5.3 10*3/uL (ref 1.4–7.7)
Neutrophils Relative %: 70.9 % (ref 43.0–77.0)
Platelets: 314 10*3/uL (ref 150.0–400.0)
RBC: 4.2 Mil/uL — ABNORMAL LOW (ref 4.22–5.81)
RDW: 16.9 % — ABNORMAL HIGH (ref 11.5–15.5)
WBC: 7.5 10*3/uL (ref 4.0–10.5)

## 2023-04-03 LAB — HEPATIC FUNCTION PANEL
ALT: 17 U/L (ref 0–53)
AST: 22 U/L (ref 0–37)
Albumin: 4.1 g/dL (ref 3.5–5.2)
Alkaline Phosphatase: 65 U/L (ref 39–117)
Bilirubin, Direct: 0.1 mg/dL (ref 0.0–0.3)
Total Bilirubin: 0.7 mg/dL (ref 0.2–1.2)
Total Protein: 6.4 g/dL (ref 6.0–8.3)

## 2023-04-03 LAB — LIPID PANEL
Cholesterol: 135 mg/dL (ref 0–200)
HDL: 42.8 mg/dL (ref >39.00)
LDL Cholesterol: 46 mg/dL (ref 0–99)
NonHDL: 92.23
Total CHOL/HDL Ratio: 3
Triglycerides: 230 mg/dL — ABNORMAL HIGH (ref 0.0–149.0)
VLDL: 46 mg/dL — ABNORMAL HIGH (ref 0.0–40.0)

## 2023-04-03 LAB — TSH: TSH: 10.08 u[IU]/mL — ABNORMAL HIGH (ref 0.35–5.50)

## 2023-04-03 LAB — HEMOGLOBIN A1C: Hgb A1c MFr Bld: 6.5 % (ref 4.6–6.5)

## 2023-04-03 LAB — BASIC METABOLIC PANEL
BUN: 32 mg/dL — ABNORMAL HIGH (ref 6–23)
CO2: 25 meq/L (ref 19–32)
Calcium: 9 mg/dL (ref 8.4–10.5)
Chloride: 107 meq/L (ref 96–112)
Creatinine, Ser: 1.69 mg/dL — ABNORMAL HIGH (ref 0.40–1.50)
GFR: 36.78 mL/min — ABNORMAL LOW (ref >60.00)
Glucose, Bld: 115 mg/dL — ABNORMAL HIGH (ref 70–99)
Potassium: 4.9 meq/L (ref 3.5–5.1)
Sodium: 141 meq/L (ref 135–145)

## 2023-04-03 LAB — VITAMIN B12: Vitamin B-12: 1408 pg/mL — ABNORMAL HIGH (ref 211–911)

## 2023-04-03 LAB — IBC PANEL
Iron: 54 ug/dL (ref 42–165)
Saturation Ratios: 16.6 % — ABNORMAL LOW (ref 20.0–50.0)
TIBC: 324.8 ug/dL (ref 250.0–450.0)
Transferrin: 232 mg/dL (ref 212.0–360.0)

## 2023-04-03 LAB — VITAMIN D 25 HYDROXY (VIT D DEFICIENCY, FRACTURES): VITD: 56.84 ng/mL (ref 30.00–100.00)

## 2023-04-03 LAB — FERRITIN: Ferritin: 17.8 ng/mL — ABNORMAL LOW (ref 22.0–322.0)

## 2023-04-06 ENCOUNTER — Other Ambulatory Visit: Payer: Self-pay | Admitting: Internal Medicine

## 2023-04-06 ENCOUNTER — Encounter: Payer: Self-pay | Admitting: Internal Medicine

## 2023-04-06 MED ORDER — POLYSACCHARIDE IRON COMPLEX 150 MG PO CAPS: 150.0000 mg | ORAL_CAPSULE | Freq: Every day | ORAL | 1 refills | Status: DC

## 2023-04-06 MED ORDER — LEVOTHYROXINE SODIUM 125 MCG PO TABS: 125.0000 ug | ORAL_TABLET | Freq: Every day | ORAL | 3 refills | Status: AC

## 2023-04-06 NOTE — Assessment & Plan Note (Signed)
Mild persistent, for cont'd niferex 150 every day, f/u lab next visit

## 2023-04-06 NOTE — Assessment & Plan Note (Signed)
Lab Results  Component Value Date   LDLCALC 46 04/02/2023   Stable, pt to continue current statin zetia 10 qd

## 2023-04-06 NOTE — Assessment & Plan Note (Signed)
Lab Results  Component Value Date   HGBA1C 6.5 04/02/2023   Stable, pt to continue current medical treatment   diet, wt control

## 2023-04-06 NOTE — Assessment & Plan Note (Signed)
Age and sex appropriate education and counseling updated with regular exercise and diet Referrals for preventative services - none needed Immunizations addressed - for shingrix at pharmacy, declines covid booster Smoking counseling  - none needed Evidence for depression or other mood disorder - none significant Most recent labs reviewed. I have personally reviewed and have noted: 1) the patient's medical and social history 2) The patient's current medications and supplements 3) The patient's height, weight, and BMI have been recorded in the chart

## 2023-04-06 NOTE — Assessment & Plan Note (Signed)
Lab Results  Component Value Date   CREATININE 1.69 (H) 04/02/2023   Stable overall, cont to avoid nephrotoxins

## 2023-04-06 NOTE — Assessment & Plan Note (Signed)
BP Readings from Last 3 Encounters:  04/02/23 120/72  03/27/23 130/70  03/19/23 126/80   Stable, pt to continue medical treatment norvasc 2.5 every day, losartan 100 every day, toprol xl 12.5 qd

## 2023-04-06 NOTE — Assessment & Plan Note (Signed)
Mild to mod, for increased levothryoxine 125 every day, to f/u any worsening symptoms or concerns

## 2023-04-07 ENCOUNTER — Ambulatory Visit: Payer: Self-pay | Admitting: Internal Medicine

## 2023-04-07 ENCOUNTER — Ambulatory Visit: Payer: Medicare Other | Admitting: Internal Medicine

## 2023-04-07 NOTE — Telephone Encounter (Signed)
  Chief Complaint: coughing Symptoms: chest tightness, tachycardia, fever, H/A Frequency: 2-3 days Pertinent Negatives: Patient denies SOB, hemoptysis Disposition: [] ED /[] Urgent Care (no appt availability in office) / [x] Appointment(In office/virtual)/ []  [place] [facility]/ [] Home Care/ [] Refused Recommended Disposition /[] [facility]/ []  Follow-up with PCP Additional Notes: Patient calling with coughing spells, chest tightness, H/A x 2-3 days. Has been taking Corcidin with some relief. Reports that his tachycardic in 90's (baseline 50). Currently afebrile. Scheduled patient per protocol on 04/07/2023. Patient verbalized understanding and to call 911 with worsening symptoms.    Copied from CRM [id number]. Topic: Clinical - Red Word Triage >> Apr 07, 2023  9:23 AM Larwance Sachs wrote: Red Word that prompted transfer to Nurse Triage: Patient called in regarding experiencing some chest tightness and coughing, as well as Tachycardia at 98 beats per minutes- states it makes him very tired. Patient also stated having a headaches and these symptoms having been ongoing for 3 days Reason for Disposition  SEVERE coughing spells (e.g., whooping sound after coughing, vomiting after coughing)  Answer Assessment - Initial Assessment Questions 1. ONSET: "When did the cough begin?"      2-3 days 2. SEVERITY: "How bad is the cough today?"      A little bit of a dry cough, then I'd cough again for 30 mins or so 3. SPUTUM: "Describe the color of your sputum" (none, dry cough; clear, white, yellow, green)     Clear 4. HEMOPTYSIS: "Are you coughing up any blood?" If so ask: "How much?" (flecks, streaks, tablespoons, etc.)     no 5. DIFFICULTY BREATHING: "Are you having difficulty breathing?" If Yes, ask: "How bad is it?" (e.g., mild, moderate, severe)    - MILD: No SOB at rest, mild SOB with walking, speaks normally in sentences, can lie down, no retractions, pulse < 100.    - MODERATE: SOB at  rest, SOB with minimal exertion and prefers to sit, cannot lie down flat, speaks in phrases, mild retractions, audible wheezing, pulse 100-120.    - SEVERE: Very SOB at rest, speaks in single words, struggling to breathe, sitting hunched forward, retractions, pulse > 120      mild 6. FEVER: "Do you have a fever?" If Yes, ask: "What is your temperature, how was it measured, and when did it start?"     This morning, 101. Currently 100.7 oral 7. CARDIAC HISTORY: "Do you have any history of heart disease?" (e.g., heart attack, congestive heart failure)      I have heart problems I called on-call cardiologist last night and advised to go to UC or PCP, preferably PCP 8. LUNG HISTORY: "Do you have any history of lung disease?"  (e.g., pulmonary embolus, asthma, emphysema)     Reports lung nodules 9. PE RISK FACTORS: "Do you have a history of blood clots?" (or: recent major surgery, recent prolonged travel, bedridden)     no 10. OTHER SYMPTOMS: "Do you have any other symptoms?" (e.g., runny nose, wheezing, chest pain)       Burning in throat and chest, but doesn't hurt when I swallow Some tachycardia in 90's, baseline at 48 Excruciating H/A - took corcidin Chest tightness, but not like an elephant sitting on your chest  Protocols used: Cough - Acute Productive-A-AH

## 2023-04-08 ENCOUNTER — Encounter: Payer: Self-pay | Admitting: Internal Medicine

## 2023-04-29 ENCOUNTER — Ambulatory Visit: Payer: Medicare Other | Admitting: Internal Medicine

## 2023-05-04 ENCOUNTER — Ambulatory Visit: Payer: Self-pay

## 2023-05-04 ENCOUNTER — Ambulatory Visit (INDEPENDENT_AMBULATORY_CARE_PROVIDER_SITE_OTHER): Payer: Medicare Other | Admitting: Internal Medicine

## 2023-05-04 ENCOUNTER — Encounter: Payer: Self-pay | Admitting: Internal Medicine

## 2023-05-04 VITALS — BP 110/60 | HR 55 | Temp 98.0°F | Ht 70.0 in | Wt 211.0 lb

## 2023-05-04 DIAGNOSIS — N1832 Chronic kidney disease, stage 3b: Secondary | ICD-10-CM

## 2023-05-04 DIAGNOSIS — J329 Chronic sinusitis, unspecified: Secondary | ICD-10-CM | POA: Diagnosis not present

## 2023-05-04 DIAGNOSIS — M26622 Arthralgia of left temporomandibular joint: Secondary | ICD-10-CM | POA: Diagnosis not present

## 2023-05-04 DIAGNOSIS — I10 Essential (primary) hypertension: Secondary | ICD-10-CM | POA: Diagnosis not present

## 2023-05-04 DIAGNOSIS — E1140 Type 2 diabetes mellitus with diabetic neuropathy, unspecified: Secondary | ICD-10-CM | POA: Diagnosis not present

## 2023-05-04 MED ORDER — CEFDINIR 300 MG PO CAPS: 300.0000 mg | ORAL_CAPSULE | Freq: Two times a day (BID) | ORAL | 0 refills | Status: DC

## 2023-05-04 NOTE — Assessment & Plan Note (Signed)
 BP Readings from Last 3 Encounters:  05/04/23 110/60  04/02/23 120/72  03/27/23 130/70   Stable, pt to continue medical treatment norvasc 2.5 every day, losartan 100 every day, toprol xl 12.5 qd

## 2023-05-04 NOTE — Progress Notes (Signed)
 Patient ID: Nathan Russo, male   DOB: June 02, 1940, 83 y.o.   MRN: [id number]        Chief Complaint: follow up HA and bilateral eye pain, left TMJ pain, htn, ckd3a       HPI:  Nathan Russo is a 83 y.o. male here with c/o 3 days onset bilateral eye pain and glabellar pain, with feeling warm and HA, but no chills, ST, cough and Pt denies chest pain, increased sob or doe, wheezing, orthopnea, PND, increased LE swelling, palpitations, dizziness or syncope.   Pt denies polydipsia, polyuria, or new focal neuro s/s.    Pt denies wt loss, night sweats, loss of appetite, or other constitutional symptoms   Has also recurring left TMJ pain getting worse to talk and chew, asks for specific dental referral.          Wt Readings from Last 3 Encounters:  05/04/23 211 lb (95.7 kg)  04/02/23 212 lb (96.2 kg)  03/27/23 210 lb (95.3 kg)   BP Readings from Last 3 Encounters:  05/04/23 110/60  04/02/23 120/72  03/27/23 130/70         Past Medical History:  Diagnosis Date   Abdominal pain, epigastric 04/11/2010   BELCHING 04/23/2010   BRADYCARDIA, CHRONIC 10/24/2006   CARPAL TUNNEL SYNDROME, BILATERAL 02/07/2009   CHEST PAIN-UNSPECIFIED 09/05/2008   CHOLELITHIASIS 04/22/2010   Cholelithiasis 06/13/2010   COLONIC POLYPS, HX OF 04/28/2007   DEGENERATIVE JOINT DISEASE, RIGHT KNEE 10/24/2006   Depression 02/23/2011   DIABETES MELLITUS, TYPE II 04/28/2007   Dizziness and giddiness 12/19/2009   Elevated PSA 06/13/2010   FATIGUE 04/28/2007   GERD 02/11/2009   Headache(784.0) 12/19/2009   HYPERLIPIDEMIA 04/28/2007   HYPERTENSION 10/21/2006   NECK MASS 02/07/2010   OBESITY 10/24/2006   OTITIS MEDIA, ACUTE, LEFT 12/26/2009   PHIMOSIS 02/07/2009   S/P laparoscopic cholecystectomy 10/31/2010   Thyroid cancer (HCC) 06/13/2010   THYROID NODULE 02/07/2010   Past Surgical History:  Procedure Laterality Date   CHOLECYSTECTOMY     left knee surgery     right wrist surgury     THYROID SURGERY      reports that he has quit  smoking. He has never used smokeless tobacco. He reports that he does not drink alcohol and does not use drugs. family history includes Arthritis in his mother; Breast cancer in his sister; Colon cancer (age of onset: 2) in his brother; Diabetes in his brother; Goiter in his mother; Heart attack (age of onset: 70) in his brother; Heart attack (age of onset: 72) in his father; Hypertension in his mother; Hypothyroidism in his sister. Allergies  Allergen Reactions   Diphenoxylate-Atropine Other (See Comments)   Other Other (See Comments) and Hives    Causes body to ache Causes body to ache   Sulfa Antibiotics Other (See Comments)    As child almost died   Ace Inhibitors     REACTION: cough   Atenolol     REACTION: bradycardia   Codeine Other (See Comments)    Head spins "wild"   Lovastatin     REACTION: myalygros   Morphine Nausea And Vomiting   Morphine And Codeine Other (See Comments)   Nsaids     Heart patient   Statins Other (See Comments)    Causes body to ache   Sulfamethoxazole Other (See Comments)    Childhood unknown reaction   Sulfasalazine Other (See Comments)    As child almost died   Tramadol  Ants crawling all over   Current Outpatient Medications on File Prior to Visit  Medication Sig Dispense Refill   amiodarone (PACERONE) 200 MG tablet Take by mouth.     amLODipine (NORVASC) 2.5 MG tablet Take 1 tablet (2.5 mg total) by mouth daily. (Patient taking differently: Take 2.5 mg by mouth at bedtime.) 90 tablet 3   apixaban (ELIQUIS) 5 MG TABS tablet Take 2.5 mg by mouth 2 (two) times daily.     Ascorbic Acid (VITAMIN C) 1000 MG tablet Take 1,000 mg by mouth daily.     augmented betamethasone dipropionate (DIPROLENE-AF) 0.05 % ointment betamethasone, augmented 0.05 % topical ointment  APPLY OINTMENT TOPICALLY TO EACH EAR AS NEEDED FOR ITCHING     Blood Glucose Calibration (ACCU-CHEK GUIDE CONTROL) LIQD Use as directed once daily as needed 1 each 3   Blood Glucose  Monitoring Suppl (ACCU-CHEK GUIDE ME) w/Device KIT Use as directed once daily E11.9 1 kit 1   Calcium Carb-Cholecalciferol [phone] MG-UNIT CAPS Take by mouth.     ciclopirox (PENLAC) 8 % solution      Cinnamon 500 MG capsule Take 500 mg by mouth 2 (two) times daily.     Continuous Glucose Receiver (DEXCOM G7 RECEIVER) DEVI Use as directed once daily E11.9 1 each 0   Continuous Glucose Sensor (DEXCOM G7 SENSOR) MISC Use as directed once every 10 days E11.9 9 each 3   cyanocobalamin 1000 MCG tablet Take 1,000 mcg by mouth daily.     ezetimibe (ZETIA) 10 MG tablet Take 1 tablet (10 mg total) by mouth daily. 90 tablet 3   Flaxseed, Linseed, 1000 MG CAPS Take 1 capsule by mouth daily.     Garlic Oil 500 MG TABS Take by mouth 2 (two) times daily.     Ginger, Zingiber officinalis, (GINGER PO) Take 1 tablet by mouth in the morning and at bedtime.     Glucosamine-Chondroit-Vit C-Mn (GLUCOSAMINE CHONDROITIN COMPLX) CAPS Take by mouth daily.     Glucosamine-Chondroitin 250-200 MG TABS Take 1 tablet by mouth at bedtime.     glucose blood (ACCU-CHEK GUIDE TEST) test strip Use as instructed 4 times every day E11.9 400 each 12   hydrocortisone 2.5 % ointment Apply 1 Application topically 2 (two) times daily.     iron polysaccharides (NU-IRON) 150 MG capsule Take 1 capsule (150 mg total) by mouth daily. 90 capsule 1   isosorbide mononitrate (IMDUR) 60 MG 24 hr tablet Take by mouth.     Lancets MISC Use as directed three times per day E11.9 300 each 3   levothyroxine (SYNTHROID) 125 MCG tablet Take 1 tablet (125 mcg total) by mouth daily. 90 tablet 3   losartan (COZAAR) 100 MG tablet Take 1 tablet (100 mg total) by mouth daily. 90 tablet 3   metoprolol succinate (TOPROL-XL) 25 MG 24 hr tablet 12.5 mg daily.     Multiple Vitamin (MULTIVITAMIN) capsule Take 1 capsule by mouth daily.     nitroGLYCERIN (NITROSTAT) 0.4 MG SL tablet 0.4 mg every 5 (five) minutes as needed for chest pain.     Omega-3 Fatty Acids  (FISH OIL) 1000 MG CAPS Take by mouth 2 (two) times daily.     pantoprazole (PROTONIX) 40 MG tablet Take 1 tablet (40 mg total) by mouth 2 (two) times daily. 180 tablet 2   prasugrel (EFFIENT) 10 MG TABS tablet Take 10 mg by mouth daily.     tamsulosin (FLOMAX) 0.4 MG CAPS capsule Take 1 capsule (0.4 mg total)  by mouth daily. 90 capsule 3   triamcinolone (KENALOG) 0.025 % cream Apply topically.     VITAMIN D, CHOLECALCIFEROL, PO Take by mouth daily.     No current facility-administered medications on file prior to visit.        ROS:  All others reviewed and negative.  Objective        PE:  BP 110/60 (BP Location: Right Arm, Patient Position: Sitting, Cuff Size: Normal)   Pulse (!) 55   Temp 98 F (36.7 C) (Oral)   Ht 5\' 10"  (1.778 m)   Wt 211 lb (95.7 kg)   SpO2 97%   BMI 30.28 kg/m                 Constitutional: Pt appears in NAD               HENT: Head: NCAT.                Right Ear: External ear normal.                 Left Ear: External ear normal. Bilat tm's with mild erythema.  Max sinus area on right mild tender.  Pharynx with mild erythema, no exudate               Eyes: . Pupils are equal, round, and reactive to light. Conjunctivae and EOM are normal               Nose: without d/c or deformity               Neck: Neck supple. Gross normal ROM               Cardiovascular: Normal rate and regular rhythm.                 Pulmonary/Chest: Effort normal and breath sounds without rales or wheezing.                Abd:  Soft, NT, ND, + BS, no organomegaly               Neurological: Pt is alert. At baseline orientation, motor grossly intact               Skin: Skin is warm. No rashes, no other new lesions, LE edema - trace bialteral               Psychiatric: Pt behavior is normal without agitation   Micro: none  Cardiac tracings I have personally interpreted today:  none  Pertinent Radiological findings (summarize): none   Lab Results  Component Value Date   WBC  7.5 04/02/2023   HGB 12.9 (L) 04/02/2023   HCT 38.3 (L) 04/02/2023   PLT 314.0 04/02/2023   GLUCOSE 115 (H) 04/02/2023   CHOL 135 04/02/2023   TRIG 230.0 (H) 04/02/2023   HDL 42.80 04/02/2023   LDLDIRECT 80.0 03/18/2022   LDLCALC 46 04/02/2023   ALT 17 04/02/2023   AST 22 04/02/2023   NA 141 04/02/2023   K 4.9 04/02/2023   CL 107 04/02/2023   CREATININE 1.69 (H) 04/02/2023   BUN 32 (H) 04/02/2023   CO2 25 04/02/2023   TSH 10.08 (H) 04/02/2023   PSA 3.59 05/08/2017   INR 1.0 08/22/2008   HGBA1C 6.5 04/02/2023   MICROALBUR <0.7 04/02/2023   Assessment/Plan:  Nathan Russo is a 83 y.o. White or Caucasian [1] male with  has a past medical history of Abdominal  pain, epigastric (04/11/2010), BELCHING (04/23/2010), BRADYCARDIA, CHRONIC (10/24/2006), CARPAL TUNNEL SYNDROME, BILATERAL (02/07/2009), CHEST PAIN-UNSPECIFIED (09/05/2008), CHOLELITHIASIS (04/22/2010), Cholelithiasis (06/13/2010), COLONIC POLYPS, HX OF (04/28/2007), DEGENERATIVE JOINT DISEASE, RIGHT KNEE (10/24/2006), Depression (02/23/2011), DIABETES MELLITUS, TYPE II (04/28/2007), Dizziness and giddiness (12/19/2009), Elevated PSA (06/13/2010), FATIGUE (04/28/2007), GERD (02/11/2009), Headache(784.0) (12/19/2009), HYPERLIPIDEMIA (04/28/2007), HYPERTENSION (10/21/2006), NECK MASS (02/07/2010), OBESITY (10/24/2006), OTITIS MEDIA, ACUTE, LEFT (12/26/2009), PHIMOSIS (02/07/2009), S/P laparoscopic cholecystectomy (10/31/2010), Thyroid cancer (HCC) (06/13/2010), and THYROID NODULE (02/07/2010).  Essential hypertension BP Readings from Last 3 Encounters:  05/04/23 110/60  04/02/23 120/72  03/27/23 130/70   Stable, pt to continue medical treatment norvasc 2.5 every day, losartan 100 every day, toprol xl 12.5 qd   Diabetes mellitus (HCC) Lab Results  Component Value Date   HGBA1C 6.5 04/02/2023   Stable, pt to continue current medical treatment  - diet,wt control   TMJ tenderness, left Also for dental referral as requested  Sinusitis Suspected  probable ethmoid sinusitis - Mild to mod, for antibx course omnicef 300 bid,,  to f/u any worsening symptoms or concerns  CKD stage 3b, GFR 30-44 ml/min (HCC) Lab Results  Component Value Date   CREATININE 1.69 (H) 04/02/2023   Stable overall, cont to avoid nephrotoxins  Followup: Return if symptoms worsen or fail to improve.  Oliver Barre, MD 05/04/2023 8:06 PM [facility]

## 2023-05-04 NOTE — Patient Instructions (Signed)
 Please take all new medication as prescribed - the antibiotic  Please continue all other medications as before, and refills have been done if requested.  Please have the pharmacy call with any other refills you may need.  Please keep your appointments with your specialists as you may have planned - eye exam tomorrow am (and the referral is done)  You will be contacted regarding the referral for: Dental as you requested

## 2023-05-04 NOTE — Assessment & Plan Note (Signed)
 Also for dental referral as requested

## 2023-05-04 NOTE — Assessment & Plan Note (Signed)
 Lab Results  Component Value Date   HGBA1C 6.5 04/02/2023   Stable, pt to continue current medical treatment   diet, wt control

## 2023-05-04 NOTE — Telephone Encounter (Signed)
  Chief Complaint: bilateral eye pain Symptoms: pain with looking up and when touching the inside corners of eyes, tearing Frequency: Friday Pertinent Negatives: Patient denies dry eyes  Disposition: [] ED /[] Urgent Care (no appt availability in office) / [x] Appointment(In office/virtual)/ []  [place] [facility] [facility]/ [] Home Care/ [] Refused Recommended Disposition /[] [facility]/ []  Follow-up with PCP Additional Notes:  Reason for Disposition  Eye pain present > 24 hours  Answer Assessment - Initial Assessment Questions 1. ONSET: "When did the pain start?" (e.g., minutes, hours, days)     Friday  2. TIMING: "Does the pain come and go, or has it been constant since it started?" (e.g., constant, intermittent, fleeting)     Comes and goes worse with touch  3. SEVERITY: "How bad is the pain?"   (Scale 1-10; mild, moderate or severe)   - MILD (1-3): doesn't interfere with normal activities    - MODERATE (4-7): interferes with normal activities or awakens from sleep    - SEVERE (8-10): excruciating pain and patient unable to do normal activities     Moderate especially when  4. LOCATION: "Where does it hurt?"  (e.g., eyelid, eye, cheekbone)     Both eyes  5. CAUSE: "What do you think is causing the pain?"     Unsure  6. VISION: "Do you have blurred vision or changes in your vision?"      no 7. EYE DISCHARGE: "Is there any discharge (pus) from the eye(s)?"  If Yes, ask: "What color is it?"      no 8. FEVER: "Do you have a fever?" If Yes, ask: "What is it, how was it measured, and when did it start?"      no 9. OTHER SYMPTOMS: "Do you have any other symptoms?" (e.g., headache, nasal discharge, facial rash)- if rubs eyes and if rubs inside of the the eye, pain brow area     Increased tearing/ headache 10. PREGNANCY: "Is there any chance you are pregnant?" "When was your last menstrual period?"       N/a  Protocols used: Eye Pain and Other Symptoms-A-AH

## 2023-05-04 NOTE — Assessment & Plan Note (Signed)
 Suspected probable ethmoid sinusitis - Mild to mod, for antibx course omnicef 300 bid,,  to f/u any worsening symptoms or concerns

## 2023-05-04 NOTE — Assessment & Plan Note (Signed)
 Lab Results  Component Value Date   CREATININE 1.69 (H) 04/02/2023   Stable overall, cont to avoid nephrotoxins

## 2023-05-16 ENCOUNTER — Other Ambulatory Visit: Payer: Self-pay | Admitting: Internal Medicine

## 2023-05-18 ENCOUNTER — Other Ambulatory Visit: Payer: Self-pay

## 2023-05-20 ENCOUNTER — Telehealth: Payer: Self-pay

## 2023-05-20 DIAGNOSIS — R519 Headache, unspecified: Secondary | ICD-10-CM

## 2023-05-20 NOTE — Telephone Encounter (Signed)
 Ok this referral to neurology is done,   Please ask pt to also see Eye doctor if he has not done this as well.   (I can refer if he wants)   thanks

## 2023-05-20 NOTE — Telephone Encounter (Unsigned)
 Copied from CRM [id number]. Topic: Referral - Request for Referral >> May 20, 2023  8:10 AM Almira Coaster wrote: Did the patient discuss referral with their provider in the last year? Yes, patient was seen on 05/04/2023 due to eye pain.  (If No - schedule appointment) (If Yes - send message)  Appointment offered? No  Type of order/referral and detailed reason for visit: Neurologist   Preference of office, provider, location: [facility]  If referral order, have you been seen by this specialty before? No (If Yes, this issue or another issue? When? Where?  Can we respond through MyChart? No, patient prefers a phone call

## 2023-06-04 ENCOUNTER — Other Ambulatory Visit: Payer: Self-pay | Admitting: Internal Medicine

## 2023-06-04 NOTE — Telephone Encounter (Signed)
 Copied from CRM [id number]. Topic: Clinical - Medication Refill >> Jun 04, 2023  1:12 PM Cammy Copa D wrote: Most Recent Primary Care Visit:  Provider: Corwin Levins  Department: [facility]  Visit Type: ACUTE  Date: 05/04/2023  Medication: Metformin 500 mg 2x a day  tablets  Has the patient contacted their pharmacy? Yes (Agent: If no, request that the patient contact the pharmacy for the refill. If patient does not wish to contact the pharmacy document the reason why and proceed with request.) (Agent: If yes, when and what did the pharmacy advise?)  Is this the correct pharmacy for this prescription? Yes If no, delete pharmacy and type the correct one.  This is the patient's preferred pharmacy:  [facility] [address], [place] - [address] [facility] [place] [address] Phone: [phone] Fax: [phone]   Has the prescription been filled recently? No  Is the patient out of the medication? No  Has the patient been seen for an appointment in the last year OR does the patient have an upcoming appointment? Yes  Can we respond through MyChart? No  Agent: Please be advised that Rx refills may take up to 3 business days. We ask that you follow-up with your pharmacy.

## 2023-06-04 NOTE — Telephone Encounter (Signed)
 Copied from CRM [id number]. Topic: Referral - Question >> Jun 04, 2023 10:48 AM Elizebeth Brooking wrote: Reason for CRM: Patient called in regarding referral for Neurologist , stated they were to far out and wanted to know if he could go to [place] [facility] and get that referral sent there

## 2023-06-05 ENCOUNTER — Telehealth: Payer: Self-pay | Admitting: Internal Medicine

## 2023-06-05 NOTE — Telephone Encounter (Signed)
 Copied from CRM [id number]. Topic: Clinical - Medication Question >> Jun 05, 2023 10:21 AM Fredrich Romans wrote: Reason for CRM: Patient called In to ask about the status metfromin medication being sent in to pharmacy for him. Medication is not on patient med list,however he said that him and PCP has talked about him being on 2 pills a day.He has been on this medication for years.

## 2023-06-10 DIAGNOSIS — I249 Acute ischemic heart disease, unspecified: Secondary | ICD-10-CM | POA: Insufficient documentation

## 2023-06-10 NOTE — Telephone Encounter (Signed)
 Very sorry, the metformin is not recommended at his level of kidney function, so we should stop this if he is taking it.   thanks

## 2023-06-11 ENCOUNTER — Ambulatory Visit (INDEPENDENT_AMBULATORY_CARE_PROVIDER_SITE_OTHER): Admitting: Internal Medicine

## 2023-06-11 ENCOUNTER — Encounter: Payer: Self-pay | Admitting: Internal Medicine

## 2023-06-11 VITALS — BP 126/78 | HR 50 | Temp 98.4°F | Ht 70.0 in | Wt 208.0 lb

## 2023-06-11 DIAGNOSIS — N1832 Chronic kidney disease, stage 3b: Secondary | ICD-10-CM | POA: Diagnosis not present

## 2023-06-11 DIAGNOSIS — R296 Repeated falls: Secondary | ICD-10-CM | POA: Diagnosis not present

## 2023-06-11 DIAGNOSIS — E1140 Type 2 diabetes mellitus with diabetic neuropathy, unspecified: Secondary | ICD-10-CM | POA: Diagnosis not present

## 2023-06-11 DIAGNOSIS — R269 Unspecified abnormalities of gait and mobility: Secondary | ICD-10-CM | POA: Diagnosis not present

## 2023-06-11 DIAGNOSIS — E538 Deficiency of other specified B group vitamins: Secondary | ICD-10-CM

## 2023-06-11 DIAGNOSIS — E7800 Pure hypercholesterolemia, unspecified: Secondary | ICD-10-CM | POA: Diagnosis not present

## 2023-06-11 DIAGNOSIS — R531 Weakness: Secondary | ICD-10-CM | POA: Diagnosis not present

## 2023-06-11 DIAGNOSIS — I10 Essential (primary) hypertension: Secondary | ICD-10-CM

## 2023-06-11 DIAGNOSIS — E559 Vitamin D deficiency, unspecified: Secondary | ICD-10-CM

## 2023-06-11 NOTE — Telephone Encounter (Signed)
 Pt seen in office today.

## 2023-06-11 NOTE — Patient Instructions (Addendum)
.  ok to stop the metformin  Please continue all other medications as before, and refills have been done if requested.  Please have the pharmacy call with any other refills you may need.  Please continue your efforts at being more active, low cholesterol diet, and weight control.  Please keep your appointments with your specialists as you may have planned  You will be contacted regarding the referral for: Physical Therapy  No further lab needed today  Please make an Appointment to return in 6 months, or sooner if needed

## 2023-06-11 NOTE — Progress Notes (Signed)
 Patient ID: Nathan Russo, male   DOB: 02/13/1941, 83 y.o.   MRN: [id number]        Chief Complaint: follow up ckd3a, dm, recurrent falls, dm, htn, hld       HPI:  Nathan Russo is a 83 y.o. male here with c/o recurring falls  - has Fallen x 2 in the past month, this time with bruising and large skin tear to right elbow, now for f/u wound care appt tomorrow.   The last fall was involving some yard work and stumbled on concrete.  No xray elbow required at ED.  Did also have palpitations now for heart monitor soon.  Lost 4 lbs since last visit, A1c 5.8 yesterday, and has borderline renal function and is able to stop the metformin now.  Has appt with Dr Allena Katz neurology apr 29 or may 12   Also to f/u with [facility] Dr Dot Been late April.  Pt denies chest pain, increased sob or doe, wheezing, orthopnea, PND, increased LE swelling, palpitations, dizziness or syncope.   Pt denies polydipsia, polyuria, or new focal neuro s/s.   Wt Readings from Last 3 Encounters:  06/11/23 208 lb (94.3 kg)  05/04/23 211 lb (95.7 kg)  04/02/23 212 lb (96.2 kg)   BP Readings from Last 3 Encounters:  06/11/23 126/78  05/04/23 110/60  04/02/23 120/72         Past Medical History:  Diagnosis Date   Abdominal pain, epigastric 04/11/2010   BELCHING 04/23/2010   BRADYCARDIA, CHRONIC 10/24/2006   CARPAL TUNNEL SYNDROME, BILATERAL 02/07/2009   CHEST PAIN-UNSPECIFIED 09/05/2008   CHOLELITHIASIS 04/22/2010   Cholelithiasis 06/13/2010   COLONIC POLYPS, HX OF 04/28/2007   DEGENERATIVE JOINT DISEASE, RIGHT KNEE 10/24/2006   Depression 02/23/2011   DIABETES MELLITUS, TYPE II 04/28/2007   Dizziness and giddiness 12/19/2009   Elevated PSA 06/13/2010   FATIGUE 04/28/2007   GERD 02/11/2009   Headache(784.0) 12/19/2009   HYPERLIPIDEMIA 04/28/2007   HYPERTENSION 10/21/2006   NECK MASS 02/07/2010   OBESITY 10/24/2006   OTITIS MEDIA, ACUTE, LEFT 12/26/2009   PHIMOSIS 02/07/2009   S/P laparoscopic cholecystectomy 10/31/2010   Thyroid  cancer (HCC) 06/13/2010   THYROID NODULE 02/07/2010   Past Surgical History:  Procedure Laterality Date   CHOLECYSTECTOMY     left knee surgery     right wrist surgury     THYROID SURGERY      reports that he has quit smoking. He has never used smokeless tobacco. He reports that he does not drink alcohol and does not use drugs. family history includes Arthritis in his mother; Breast cancer in his sister; Colon cancer (age of onset: 86) in his brother; Diabetes in his brother; Goiter in his mother; Heart attack (age of onset: 76) in his brother; Heart attack (age of onset: 52) in his father; Hypertension in his mother; Hypothyroidism in his sister. Allergies  Allergen Reactions   Diphenoxylate-Atropine Other (See Comments)   Other Other (See Comments) and Hives    Causes body to ache Causes body to ache   Sulfa Antibiotics Other (See Comments)    As child almost died   Ace Inhibitors     REACTION: cough   Atenolol     REACTION: bradycardia   Codeine Other (See Comments)    Head spins "wild"   Lovastatin     REACTION: myalygros   Morphine Nausea And Vomiting   Morphine And Codeine Other (See Comments)   Nsaids     Heart  patient   Statins Other (See Comments)    Causes body to ache   Sulfamethoxazole Other (See Comments)    Childhood unknown reaction   Sulfasalazine Other (See Comments)    As child almost died   Tramadol     Ants crawling all over   Current Outpatient Medications on File Prior to Visit  Medication Sig Dispense Refill   amiodarone (PACERONE) 200 MG tablet Take by mouth.     amLODipine (NORVASC) 2.5 MG tablet Take 1 tablet (2.5 mg total) by mouth daily. (Patient taking differently: Take 2.5 mg by mouth at bedtime.) 90 tablet 3   apixaban (ELIQUIS) 5 MG TABS tablet Take 2.5 mg by mouth 2 (two) times daily.     Ascorbic Acid (VITAMIN C) 1000 MG tablet Take 1,000 mg by mouth daily.     augmented betamethasone dipropionate (DIPROLENE-AF) 0.05 % ointment  betamethasone, augmented 0.05 % topical ointment  APPLY OINTMENT TOPICALLY TO EACH EAR AS NEEDED FOR ITCHING     Blood Glucose Calibration (ACCU-CHEK GUIDE CONTROL) LIQD Use as directed once daily as needed 1 each 3   Blood Glucose Monitoring Suppl (ACCU-CHEK GUIDE ME) w/Device KIT Use as directed once daily E11.9 1 kit 1   Calcium Carb-Cholecalciferol [phone] MG-UNIT CAPS Take by mouth.     cefdinir (OMNICEF) 300 MG capsule Take 1 capsule (300 mg total) by mouth 2 (two) times daily. 20 capsule 0   ciclopirox (PENLAC) 8 % solution      Cinnamon 500 MG capsule Take 500 mg by mouth 2 (two) times daily.     Continuous Glucose Receiver (DEXCOM G7 RECEIVER) DEVI Use as directed once daily E11.9 1 each 0   Continuous Glucose Sensor (DEXCOM G7 SENSOR) MISC Use as directed once every 10 days E11.9 9 each 3   cyanocobalamin 1000 MCG tablet Take 1,000 mcg by mouth daily.     ezetimibe (ZETIA) 10 MG tablet Take 1 tablet by mouth once daily 90 tablet 0   Flaxseed, Linseed, 1000 MG CAPS Take 1 capsule by mouth daily.     Garlic Oil 500 MG TABS Take by mouth 2 (two) times daily.     Ginger, Zingiber officinalis, (GINGER PO) Take 1 tablet by mouth in the morning and at bedtime.     Glucosamine-Chondroit-Vit C-Mn (GLUCOSAMINE CHONDROITIN COMPLX) CAPS Take by mouth daily.     Glucosamine-Chondroitin 250-200 MG TABS Take 1 tablet by mouth at bedtime.     glucose blood (ACCU-CHEK GUIDE TEST) test strip Use as instructed 4 times every day E11.9 400 each 12   hydrocortisone 2.5 % ointment Apply 1 Application topically 2 (two) times daily.     iron polysaccharides (NU-IRON) 150 MG capsule Take 1 capsule (150 mg total) by mouth daily. 90 capsule 1   isosorbide mononitrate (IMDUR) 60 MG 24 hr tablet Take by mouth.     Lancets MISC Use as directed three times per day E11.9 300 each 3   levothyroxine (SYNTHROID) 125 MCG tablet Take 1 tablet (125 mcg total) by mouth daily. 90 tablet 3   losartan (COZAAR) 100 MG tablet  Take 1 tablet (100 mg total) by mouth daily. 90 tablet 3   metoprolol succinate (TOPROL-XL) 25 MG 24 hr tablet 12.5 mg daily.     Multiple Vitamin (MULTIVITAMIN) capsule Take 1 capsule by mouth daily.     nitroGLYCERIN (NITROSTAT) 0.4 MG SL tablet 0.4 mg every 5 (five) minutes as needed for chest pain.     Omega-3 Fatty Acids (  FISH OIL) 1000 MG CAPS Take by mouth 2 (two) times daily.     pantoprazole (PROTONIX) 40 MG tablet Take 1 tablet (40 mg total) by mouth 2 (two) times daily. 180 tablet 2   prasugrel (EFFIENT) 10 MG TABS tablet Take 10 mg by mouth daily.     tamsulosin (FLOMAX) 0.4 MG CAPS capsule Take 1 capsule (0.4 mg total) by mouth daily. 90 capsule 3   triamcinolone (KENALOG) 0.025 % cream Apply topically.     VITAMIN D, CHOLECALCIFEROL, PO Take by mouth daily.     No current facility-administered medications on file prior to visit.        ROS:  All others reviewed and negative.  Objective        PE:  BP 126/78 (BP Location: Left Arm, Patient Position: Sitting, Cuff Size: Normal)   Pulse (!) 50   Temp 98.4 F (36.9 C) (Oral)   Ht 5\' 10"  (1.778 m)   Wt 208 lb (94.3 kg)   SpO2 97%   BMI 29.84 kg/m                 Constitutional: Pt appears in NAD               HENT: Head: NCAT.                Right Ear: External ear normal.                 Left Ear: External ear normal.                Eyes: . Pupils are equal, round, and reactive to light. Conjunctivae and EOM are normal               Nose: without d/c or deformity               Neck: Neck supple. Gross normal ROM               Cardiovascular: Normal rate and regular rhythm.                 Pulmonary/Chest: Effort normal and breath sounds without rales or wheezing.                Abd:  Soft, NT, ND, + BS, no organomegaly               Neurological: Pt is alert. At baseline orientation, motor grossly intact               Skin: Skin is warm. No rashes, no other new lesions, LE edema - none               Psychiatric: Pt  behavior is normal without agitation   Micro: none  Cardiac tracings I have personally interpreted today:  none  Pertinent Radiological findings (summarize): none   Lab Results  Component Value Date   WBC 7.5 04/02/2023   HGB 12.9 (L) 04/02/2023   HCT 38.3 (L) 04/02/2023   PLT 314.0 04/02/2023   GLUCOSE 115 (H) 04/02/2023   CHOL 135 04/02/2023   TRIG 230.0 (H) 04/02/2023   HDL 42.80 04/02/2023   LDLDIRECT 80.0 03/18/2022   LDLCALC 46 04/02/2023   ALT 17 04/02/2023   AST 22 04/02/2023   NA 141 04/02/2023   K 4.9 04/02/2023   CL 107 04/02/2023   CREATININE 1.69 (H) 04/02/2023   BUN 32 (H) 04/02/2023   CO2 25 04/02/2023   TSH 10.08 (H)  04/02/2023   PSA 3.59 05/08/2017   INR 1.0 08/22/2008   HGBA1C 6.5 04/02/2023   MICROALBUR <0.7 04/02/2023   Assessment/Plan:  Nathan Russo is a 83 y.o. White or Caucasian [1] male with  has a past medical history of Abdominal pain, epigastric (04/11/2010), BELCHING (04/23/2010), BRADYCARDIA, CHRONIC (10/24/2006), CARPAL TUNNEL SYNDROME, BILATERAL (02/07/2009), CHEST PAIN-UNSPECIFIED (09/05/2008), CHOLELITHIASIS (04/22/2010), Cholelithiasis (06/13/2010), COLONIC POLYPS, HX OF (04/28/2007), DEGENERATIVE JOINT DISEASE, RIGHT KNEE (10/24/2006), Depression (02/23/2011), DIABETES MELLITUS, TYPE II (04/28/2007), Dizziness and giddiness (12/19/2009), Elevated PSA (06/13/2010), FATIGUE (04/28/2007), GERD (02/11/2009), Headache(784.0) (12/19/2009), HYPERLIPIDEMIA (04/28/2007), HYPERTENSION (10/21/2006), NECK MASS (02/07/2010), OBESITY (10/24/2006), OTITIS MEDIA, ACUTE, LEFT (12/26/2009), PHIMOSIS (02/07/2009), S/P laparoscopic cholecystectomy (10/31/2010), Thyroid cancer (HCC) (06/13/2010), and THYROID NODULE (02/07/2010).  Diabetes mellitus (HCC) Lab Results  Component Value Date   HGBA1C 6.5 04/02/2023   Overall good control with goal A1c < 7.5 and recurrent falls, pt to d/c metformin   Hyperlipidemia Lab Results  Component Value Date   LDLCALC 46 04/02/2023   Stable,  pt to continue current zetia 10 mg qd   Essential hypertension BP Readings from Last 3 Encounters:  06/11/23 126/78  05/04/23 110/60  04/02/23 120/72   Stable, pt to continue medical treatment norvasc 2.5 every day, toprol xl 12.5 every day, losartan 100 qd   CKD stage 3b, GFR 30-44 ml/min (HCC) Lab Results  Component Value Date   CREATININE 1.69 (H) 04/02/2023   Stable overall, cont to avoid nephrotoxins   Gait disorder Also for PT referral in [place] per pt reqeust  Recurrent falls while walking Etiology unclear, for lab today as ordered  Generalized weakness Also for PT referral as above  Followup: Return in about 6 months (around 12/11/2023).  Oliver Barre, MD 06/14/2023 10:46 AM [facility]

## 2023-06-13 ENCOUNTER — Other Ambulatory Visit: Payer: Self-pay | Admitting: Internal Medicine

## 2023-06-14 ENCOUNTER — Encounter: Payer: Self-pay | Admitting: Internal Medicine

## 2023-06-14 DIAGNOSIS — R531 Weakness: Secondary | ICD-10-CM | POA: Insufficient documentation

## 2023-06-14 DIAGNOSIS — R296 Repeated falls: Secondary | ICD-10-CM | POA: Insufficient documentation

## 2023-06-14 NOTE — Assessment & Plan Note (Signed)
 Also for PT referral as above

## 2023-06-14 NOTE — Assessment & Plan Note (Signed)
 Lab Results  Component Value Date   CREATININE 1.69 (H) 04/02/2023   Stable overall, cont to avoid nephrotoxins

## 2023-06-14 NOTE — Assessment & Plan Note (Signed)
 BP Readings from Last 3 Encounters:  06/11/23 126/78  05/04/23 110/60  04/02/23 120/72   Stable, pt to continue medical treatment norvasc 2.5 every day, toprol xl 12.5 every day, losartan 100 qd

## 2023-06-14 NOTE — Assessment & Plan Note (Signed)
 Lab Results  Component Value Date   HGBA1C 6.5 04/02/2023   Overall good control with goal A1c < 7.5 and recurrent falls, pt to d/c metformin

## 2023-06-14 NOTE — Assessment & Plan Note (Signed)
 Lab Results  Component Value Date   LDLCALC 46 04/02/2023   Stable, pt to continue current zetia 10 mg qd

## 2023-06-14 NOTE — Assessment & Plan Note (Signed)
 Also for PT referral in [place] per pt reqeust

## 2023-06-14 NOTE — Assessment & Plan Note (Signed)
 Etiology unclear, for lab today as ordered

## 2023-06-15 ENCOUNTER — Other Ambulatory Visit: Payer: Self-pay

## 2023-07-07 ENCOUNTER — Ambulatory Visit: Admitting: Physical Therapy

## 2023-07-20 ENCOUNTER — Ambulatory Visit (INDEPENDENT_AMBULATORY_CARE_PROVIDER_SITE_OTHER): Admitting: Neurology

## 2023-07-20 ENCOUNTER — Encounter: Payer: Self-pay | Admitting: Neurology

## 2023-07-20 VITALS — BP 129/75 | HR 64 | Resp 20 | Ht 70.0 in | Wt 208.0 lb

## 2023-07-20 DIAGNOSIS — R519 Headache, unspecified: Secondary | ICD-10-CM

## 2023-07-20 DIAGNOSIS — R2681 Unsteadiness on feet: Secondary | ICD-10-CM | POA: Diagnosis not present

## 2023-07-20 DIAGNOSIS — G629 Polyneuropathy, unspecified: Secondary | ICD-10-CM | POA: Diagnosis not present

## 2023-07-20 NOTE — Patient Instructions (Signed)
 CT head will be ordered

## 2023-07-20 NOTE — Progress Notes (Signed)
 Follow-up Visit   Date: 07/20/2023    Nathan Russo MRN: [id number] DOB: 01-13-1941    Nathan Russo is a 83 y.o. right-handed Caucasian male with well-controlled diabetes mellitus, hypertension, hyperlipidemia, GERD, thyroid  cancer s/p resection and radiation (2012),  paroxsymal atrial fibrillation, and SVT returning to the clinic for follow-up of headaches.  The patient was accompanied to the clinic by self.  IMPRESSION/PLAN: New onset headaches, initially triggered by eye movement, now mostly a dull bifrontal headache.  He has history of thyroid  cancer and headaches is very atypical for him, so will check CT head without contrast to be complete.  Neurological exam is reassuring.  Peripheral neuropathy, idiopathic.  Continue supportive care.  Multifactorial unsteady gait due to neuropathy and left knee pain.  Fall precautions discussed, encouraged to use a rollator. He is high risk for falls and on anticoagulation therapy.   Return to clinic 4 months  --------------------------------------------- History of present illness: He has history of neuropathy affecting the feet for several years.  Prior NCS/EMG in 2021 confirmed sensorimotor neuropathy affecting the legs.  Over the past few months, he has noticed worsening numbness over the sole of the feet and great toe.  No tingling or pain in the feet.  His balance is also poor and attributes this to left knee pain. He denies recurrent low back or shooting pain into the legs.  He has some imbalance and has fallen a few times over the past year.  He has been walking with a cane for the past 5-6 years.     He lives with wife in a two level home.  He has a stair lift. 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  UPDATE 07/20/2023:  Starting around early 2024, he began having dull ache involving the forehead which would cause shooting pain when he looks up.  It is very variable in duration and can last anywhere from  minutes to hours and occur intermittently throughout the week.  He saw his PCP and eye doctor who did not find anything worrisome. Over the past month, the shooting pain with upgaze has resolved.  He continues to have intermittent a dull headache over the forehead.  He tried tylenol  which did provide relief.  He takes tylenol  about 3 times per week.   Medications:  Current Outpatient Medications on File Prior to Visit  Medication Sig Dispense Refill   amiodarone  (PACERONE ) 200 MG tablet Take by mouth.     amLODipine  (NORVASC ) 2.5 MG tablet Take 1 tablet by mouth once daily 90 tablet 3   apixaban  (ELIQUIS ) 5 MG TABS tablet Take 2.5 mg by mouth 2 (two) times daily.     Ascorbic Acid (VITAMIN C) 1000 MG tablet Take 1,000 mg by mouth daily.     augmented betamethasone  dipropionate (DIPROLENE -AF) 0.05 % ointment betamethasone , augmented 0.05 % topical ointment  APPLY OINTMENT TOPICALLY TO EACH EAR AS NEEDED FOR ITCHING     Blood Glucose Calibration (ACCU-CHEK GUIDE CONTROL) LIQD Use as directed once daily as needed 1 each 3   Blood Glucose Monitoring Suppl (ACCU-CHEK GUIDE ME) w/Device KIT Use as directed once daily E11.9 1 kit 1   Calcium  Carb-Cholecalciferol [phone] MG-UNIT CAPS Take by mouth.     cefdinir  (OMNICEF ) 300 MG capsule Take 1 capsule (300 mg total) by mouth 2 (two) times daily. 20 capsule 0   ciclopirox (PENLAC) 8 % solution      Cinnamon 500 MG capsule Take  500 mg by mouth 2 (two) times daily.     Continuous Glucose Receiver (DEXCOM G7 RECEIVER) DEVI Use as directed once daily E11.9 1 each 0   Continuous Glucose Sensor (DEXCOM G7 SENSOR) MISC Use as directed once every 10 days E11.9 9 each 3   cyanocobalamin  1000 MCG tablet Take 1,000 mcg by mouth daily.     ezetimibe  (ZETIA ) 10 MG tablet Take 1 tablet by mouth once daily 90 tablet 0   Flaxseed, Linseed, 1000 MG CAPS Take 1 capsule by mouth daily.     Garlic Oil 500 MG TABS Take by mouth 2 (two) times daily.     Ginger, Zingiber  officinalis, (GINGER PO) Take 1 tablet by mouth in the morning and at bedtime.     Glucosamine-Chondroit-Vit C-Mn (GLUCOSAMINE CHONDROITIN COMPLX) CAPS Take by mouth daily.     Glucosamine-Chondroitin 250-200 MG TABS Take 1 tablet by mouth at bedtime.     glucose blood (ACCU-CHEK GUIDE TEST) test strip Use as instructed 4 times every day E11.9 400 each 12   hydrocortisone 2.5 % ointment Apply 1 Application topically 2 (two) times daily.     iron  polysaccharides (NU-IRON ) 150 MG capsule Take 1 capsule (150 mg total) by mouth daily. 90 capsule 1   isosorbide  mononitrate (IMDUR ) 60 MG 24 hr tablet Take by mouth.     Lancets MISC Use as directed three times per day E11.9 300 each 3   levothyroxine  (SYNTHROID ) 125 MCG tablet Take 1 tablet (125 mcg total) by mouth daily. 90 tablet 3   losartan  (COZAAR ) 100 MG tablet Take 1 tablet (100 mg total) by mouth daily. 90 tablet 3   metoprolol  succinate (TOPROL -XL) 25 MG 24 hr tablet 12.5 mg daily.     Multiple Vitamin (MULTIVITAMIN) capsule Take 1 capsule by mouth daily.     nitroGLYCERIN  (NITROSTAT ) 0.4 MG SL tablet 0.4 mg every 5 (five) minutes as needed for chest pain.     Omega-3 Fatty Acids (FISH OIL) 1000 MG CAPS Take by mouth 2 (two) times daily.     pantoprazole  (PROTONIX ) 40 MG tablet Take 1 tablet (40 mg total) by mouth 2 (two) times daily. 180 tablet 2   prasugrel  (EFFIENT ) 10 MG TABS tablet Take 10 mg by mouth daily.     tamsulosin  (FLOMAX ) 0.4 MG CAPS capsule Take 1 capsule (0.4 mg total) by mouth daily. 90 capsule 3   triamcinolone  (KENALOG ) 0.025 % cream Apply topically.     VITAMIN D , CHOLECALCIFEROL, PO Take by mouth daily.     No current facility-administered medications on file prior to visit.    Allergies:  Allergies  Allergen Reactions   Diphenoxylate -Atropine  Other (See Comments)   Other Other (See Comments) and Hives    Causes body to ache Causes body to ache   Sulfa Antibiotics Other (See Comments)    As child almost died    Ace Inhibitors     REACTION: cough   Atenolol     REACTION: bradycardia   Codeine Other (See Comments)    Head spins "wild"   Lovastatin     REACTION: myalygros   Morphine Nausea And Vomiting   Morphine And Codeine Other (See Comments)   Nsaids     Heart patient   Statins Other (See Comments)    Causes body to ache   Sulfamethoxazole Other (See Comments)    Childhood unknown reaction   Sulfasalazine Other (See Comments)    As child almost died   Tramadol   Ants crawling all over    Vital Signs:  BP 129/75   Pulse 64   Resp 20   Ht 5\' 10"  (1.778 m)   Wt 208 lb (94.3 kg)   SpO2 95%   BMI 29.84 kg/m   Neurological Exam: MENTAL STATUS including orientation to time, place, person, recent and remote memory, attention span and concentration, language, and fund of knowledge is normal.  Speech is not dysarthric.  CRANIAL NERVES:  No visual field defects.  Pupils equal round and reactive to light.  Normal conjugate, extra-ocular eye movements in all directions of gaze.  No ptosis.  Face is symmetric. Palate elevates symmetrically.  Tongue is midline.  MOTOR:  Motor strength is 5/5 in all extremities, including distally.  No atrophy, fasciculations or abnormal movements.  No pronator drift.  Tone is normal.    MSRs:  Reflexes are 2+/4 in the arms, except 1+/4 at the patella and absent at the ankles.  SENSORY:  Absent vibration below the ankles bilaterally.   COORDINATION/GAIT:  Normal finger-to- nose-finger.  Gait appears wide-based, antalgic, unsteady with turns,  he is more stable when using a cane   Data: n/a    Thank you for allowing me to participate in patient's care.  If I can answer any additional questions, I would be pleased to do so.    Sincerely,    Cornelious Diven K. Lydia Sams, DO

## 2023-07-21 ENCOUNTER — Other Ambulatory Visit: Payer: Self-pay

## 2023-07-21 ENCOUNTER — Ambulatory Visit: Attending: Internal Medicine

## 2023-07-21 DIAGNOSIS — R296 Repeated falls: Secondary | ICD-10-CM | POA: Diagnosis not present

## 2023-07-21 DIAGNOSIS — Z9181 History of falling: Secondary | ICD-10-CM | POA: Insufficient documentation

## 2023-07-21 DIAGNOSIS — R262 Difficulty in walking, not elsewhere classified: Secondary | ICD-10-CM | POA: Diagnosis present

## 2023-07-21 DIAGNOSIS — R531 Weakness: Secondary | ICD-10-CM | POA: Diagnosis not present

## 2023-07-21 DIAGNOSIS — R269 Unspecified abnormalities of gait and mobility: Secondary | ICD-10-CM | POA: Insufficient documentation

## 2023-07-21 NOTE — Therapy (Signed)
 OUTPATIENT PHYSICAL THERAPY LOWER EXTREMITY EVALUATION   Patient Name: Nathan Russo MRN: [id number] DOB:07/21/1940, 83 y.o., male Today's Date: 07/22/2023  END OF SESSION:  PT End of Session - 07/21/23 0936     Visit Number 1    Date for PT Re-Evaluation 10/13/23    Progress Note Due on Visit 10    PT Start Time 0932    PT Stop Time 1015    PT Time Calculation (min) 43 min             Past Medical History:  Diagnosis Date   Abdominal pain, epigastric 04/11/2010   BELCHING 04/23/2010   BRADYCARDIA, CHRONIC 10/24/2006   CARPAL TUNNEL SYNDROME, BILATERAL 02/07/2009   CHEST PAIN-UNSPECIFIED 09/05/2008   CHOLELITHIASIS 04/22/2010   Cholelithiasis 06/13/2010   COLONIC POLYPS, HX OF 04/28/2007   DEGENERATIVE JOINT DISEASE, RIGHT KNEE 10/24/2006   Depression 02/23/2011   DIABETES MELLITUS, TYPE II 04/28/2007   Dizziness and giddiness 12/19/2009   Elevated PSA 06/13/2010   FATIGUE 04/28/2007   GERD 02/11/2009   Headache(784.0) 12/19/2009   HYPERLIPIDEMIA 04/28/2007   HYPERTENSION 10/21/2006   NECK MASS 02/07/2010   OBESITY 10/24/2006   OTITIS MEDIA, ACUTE, LEFT 12/26/2009   PHIMOSIS 02/07/2009   S/P laparoscopic cholecystectomy 10/31/2010   Thyroid  cancer (HCC) 06/13/2010   THYROID  NODULE 02/07/2010   Past Surgical History:  Procedure Laterality Date   CHOLECYSTECTOMY     left knee surgery     right wrist surgury     THYROID  SURGERY     Patient Active Problem List   Diagnosis Date Noted   Recurrent falls while walking 06/14/2023   Generalized weakness 06/14/2023   ACS (acute coronary syndrome) (HCC) 06/10/2023   TMJ tenderness, left 05/04/2023   Sinusitis 05/04/2023   Low back pain 03/23/2023   BPH (benign prostatic hyperplasia) 03/21/2023   Acquired trigger finger of left little finger 02/25/2023   Acquired trigger finger of right little finger 02/12/2023   Pain in finger of left hand 02/12/2023   Orthostatic syncope 11/11/2022   Multiple pulmonary nodules 11/11/2022   Iron   deficiency anemia 11/11/2022   Constipation 11/11/2022   Orthostasis 11/09/2022   Hypothyroidism 11/09/2022   Pulmonary nodules 11/09/2022   Fatigue 11/08/2022   Dysuria 11/01/2022   Dermatofibroma of chest 10/30/2022   Melanocytic nevi of trunk 10/30/2022   History of skin cancer 10/30/2022   Nevus of scalp 10/30/2022   Psoriasis 10/30/2022   Seborrheic keratoses 10/30/2022   Stasis dermatitis of both legs 10/30/2022   Gait disorder 10/18/2022   Chest pain 06/02/2022   TMJ arthralgia 03/28/2022   Leg hematoma, right, initial encounter 03/18/2022   Osteoarthritis of left knee 12/17/2021   Right groin pain 11/15/2020   CKD stage 3b, GFR 30-44 ml/min (HCC) 10/24/2020   Iatrogenic hyperthyroidism 10/24/2020   Left rib fracture 10/18/2019   Microhematuria 10/18/2019   Left arm pain 10/14/2019   Rib pain on left side 10/14/2019   Dark urine 10/14/2019   Ischemic dilated cardiomyopathy (HCC) 10/06/2019   Bradycardia 08/15/2019   OSA on CPAP 08/09/2019   Daytime somnolence 07/12/2019   Coronary atherosclerosis of native coronary artery 07/06/2019   Paresthesia and pain of both upper extremities 05/16/2019   Paresthesia of both lower extremities 05/16/2019   Hypersomnolence 05/16/2019   ST elevation (STEMI) myocardial infarction of unspecified site [facility]) 04/24/2019   Current use of long term anticoagulation 12/03/2018   AKI (acute kidney injury) (HCC) 10/05/2017   Hypokalemia 10/05/2017   Infectious  diarrhea 10/05/2017   Slow urinary stream 10/05/2017   Chronic right shoulder pain 09/21/2017   Pain in joint of right shoulder 08/05/2017   Pain in joint of left shoulder 08/05/2017   Neuropathy 07/27/2017   Leg abscess 03/04/2016   Right leg pain 02/29/2016   Cough 02/23/2016   PAT (paroxysmal atrial tachycardia) (HCC) 09/07/2015   Otitis externa 08/02/2014   Hypersomnia 09/14/2013   Dyspnea on exertion 04/12/2013   Hypotension 04/12/2013   Community acquired pneumonia  04/06/2013   Status post ablation of atrial fibrillation 03/17/2013   Status post ablation of atrial flutter 03/17/2013   Postoperative hypothyroidism 05/05/2012   Papillary carcinoma of thyroid  (HCC) 05/05/2012   Essential hypertension 03/28/2012   Atrial fibrillation and flutter (HCC) 03/27/2012   Pain in wrist 09/03/2011   Vertigo 02/23/2011   Malignant neoplasm of thyroid  gland (HCC) 02/11/2011   Supraventricular arrhythmia 02/06/2011   Chronic pain 12/13/2010   History of cholecystectomy 10/31/2010   Encounter for well adult exam with abnormal findings 10/31/2010   Bladder neck obstruction 06/13/2010   Elevated prostate specific antigen (PSA) 06/13/2010   Gastroesophageal reflux disease 02/11/2009   CARPAL TUNNEL SYNDROME, BILATERAL 02/07/2009   Carpal tunnel syndrome, bilateral 02/07/2009   Diabetes mellitus (HCC) 04/28/2007   Hyperlipidemia 04/28/2007   Malaise and fatigue 04/28/2007   History of colonic polyps 04/28/2007   Adiposity 10/24/2006   BRADYCARDIA, CHRONIC 10/24/2006   Osteoarthritis 10/24/2006   Cardiac arrhythmia 10/24/2006    PCP: Roslyn Coombe, MD  REFERRING PROVIDER: same  REFERRING DIAG: history of falls  THERAPY DIAG:  Difficulty in walking, not elsewhere classified - Plan: PT plan of care cert/re-cert  History of falling - Plan: PT plan of care cert/re-cert  Rationale for Evaluation and Treatment: Rehabilitation  ONSET DATE: 6 months  SUBJECTIVE:   SUBJECTIVE STATEMENT: Recent fall outdoors, the piece of lawn equipment I was using ran into my legs and knocked me down, so I was in the hospital, scraped up my R elbow pretty badly.  When I fall I seem to have no warning.  PERTINENT HISTORY: H/o a fib, traumatic arthritis L knee PAIN:  Are you having pain? Yes: NPRS scale: 5 to 10/10 Pain location: L knee Pain description: never goes away, deep pain Aggravating factors: prolonged standing Relieving factors: voltaren  gel, gets shots in knee  at orthopedist  PRECAUTIONS: Fall  RED FLAGS: None   WEIGHT BEARING RESTRICTIONS: No  FALLS:  Has patient fallen in last 6 months? Yes. Number of falls several in the last 6 months  LIVING ENVIRONMENT: Lives with: lives with their family and lives with their spouse Lives in: House/apartment Stairs: 2 steps to each entrance, uses garage with rail Has following equipment at home: Single point cane  OCCUPATION: retired, does a lot of yard work, has a recumbent bike at home that he uses regularly  PLOF: Independent  PATIENT GOALS: avoid falls, keep helping my wife  NEXT MD VISIT: unknown  OBJECTIVE:  Note: Objective measures were completed at Evaluation unless otherwise noted.  DIAGNOSTIC FINDINGS: na  PATIENT SURVEYS:  LEFS 55  COGNITION: Overall cognitive status: Within functional limits for tasks assessed     SENSATION: Pt reports that he was told he has neuropathy but he doesn't notice burning, tingling, loss of sensation feet B  EDEMA:  None noted today  MUSCLE LENGTH: Hamstrings: Right wfl deg; Left -30 deg Thomas test: Right bt deg; Left nt deg  POSTURE: marked varum L knee, noted lateral  shifting L knee with weight bearing with gait and balance testing  PALPATION: na  LOWER EXTREMITY ROM:wfl B  LOWER EXTREMITY MMT:  MMT Right eval Left eval  Hip flexion    Hip extension    Hip abduction    Hip adduction    Hip internal rotation    Hip external rotation    Knee flexion 4+ 4+  Knee extension 5 4  Ankle dorsiflexion able to B heel rock    Ankle plantarflexion able to B heel raise    Ankle inversion    Ankle eversion     (Blank rows = wfl)  LOWER EXTREMITY SPECIAL TESTS:  na  FUNCTIONAL TESTS:  Berg Balance Scale: 45/56 TUG 15.4 sec, with st cane Gait 20' with horizontal head turns wnl Gait 20' with vertical head turns wnl  GAIT: Distance walked: In clinic Assistive device utilized: Single point cane Level of assistance: Modified  independence Comments: antalgic L LE, increased as fatigued, at end of today's session and after 50' gait                                                                                                                                 TREATMENT DATE: 07/21/23 Evaluation, instructed in balance activities to participate in at home to increase his safety awareness and righting reactions   PATIENT EDUCATION:  Education details: POC, goals Person educated: Patient Education method: Explanation, Demonstration, Tactile cues, Verbal cues, and Handouts Education comprehension: verbalized understanding, returned demonstration, and verbal cues required  HOME EXERCISE PROGRAM: Access Code: [email or web address] URL: [email or web address] Date: 07/21/2023 Prepared by: Rolena Knutson  Exercises - Tandem Stance with Head Rotation  - 1 x daily - 7 x weekly - 3 sets - 10 reps - Heel Toe Raises with Counter Support  - 1 x daily - 7 x weekly - 3 sets - 10 reps  ASSESSMENT:  CLINICAL IMPRESSION: Patient is a 83 y.o. male who was evaluated today by physical therapy for recurrent falls.  He reports that the falls have happened primarily due to external object, a piece of lawn equipment hit him and knocked him down, he slipped on ice, etc.  Evaluation results show mod risk of fall with Berg score of 45/56.  Also noted marked L Le antagia, increased as we progressed with evaluation, more notable at end of session.  The most challenging aspects of the balance testing were movements involving narrowed stand and quick turns, therefore gave him some activities to perform at home to assist him with better safety awareness.  He did not wish to return for additional physical therapy instruction.  Did reiterate to him that using a rollator as his PCP has recommended would be beneficial due to his chronic L knee pain and instability.  Also advised him to ask his orthopedist whether a L knee brace would help him with  stability.  No further treatment recommended, pt citing his caregiver duties for  his wife and distance from his home to the clinic and significant barriers  OBJECTIVE IMPAIRMENTS: decreased activity tolerance, decreased balance, decreased endurance, decreased knowledge of use of DME, decreased mobility, difficulty walking, decreased strength, postural dysfunction, and pain.   ACTIVITY LIMITATIONS: carrying, standing, squatting, stairs, transfers, locomotion level, and caring for others  PARTICIPATION LIMITATIONS: meal prep, cleaning, laundry, and community activity  PERSONAL FACTORS: Age, Behavior pattern, Past/current experiences, and 1-2 comorbidities: afib, traumatic arthritis L knee are also affecting patient's functional outcome.   REHAB POTENTIAL: na  CLINICAL DECISION MAKING: Evolving/moderate complexity  EVALUATION COMPLEXITY: Moderate   GOALS: Goals reviewed with patient? Yes  SHORT TERM GOALS: Target date: 07/21/23 I HEP Baseline: Goal status: INITIAL    PLAN:  PT FREQUENCY: one time visit  PT DURATION: once  PLANNED INTERVENTIONS: 97110-Therapeutic exercises, 97530- Therapeutic activity, 97112- Neuromuscular re-education, 97535- Self Care, and 09811- Manual therapy  PLAN FOR NEXT SESSION: pt requests one time visit due to caregiving for his wife and distance to clinic   Adalea Handler L Roma Bondar, PT,DPT, OCS 07/22/2023, 2:28 PM  Date of referral: 06/11/23 Referring provider: Rosalia Colonel, MD Referring diagnosis? Recurrent falls Treatment diagnosis? (if different than referring diagnosis) h/o falling Z 91.81  What was this (referring dx) caused by? Hercules Lombard of Condition: Recurrent (multiple episodes of < 3 months)   Laterality: Both  Current Functional Measure Score: Other Berg 45/56  Objective measurements identify impairments when they are compared to normal values, the uninvolved extremity, and prior level of function.  [x]  Yes  []  No  Objective assessment of  functional ability: Moderate functional limitations   Briefly describe symptoms: chronic L knee pain, traumatic arthritis, multiple falls in last 6 months , pt with balance deficits with narrowed stand and turns  How did symptoms start: knocked down by lawn maching  Average pain intensity:  Last 24 hours: 5 to 10  Past week: 9  How often does the pt experience symptoms? Frequently  How much have the symptoms interfered with usual daily activities? Moderately  How has condition changed since care began at this facility? NA - initial visit  In general, how is the patients overall health? Very Good   BACK PAIN (STarT Back Screening Tool) No

## 2023-07-31 ENCOUNTER — Ambulatory Visit
Admission: RE | Admit: 2023-07-31 | Discharge: 2023-07-31 | Disposition: A | Discharge status: Home / Self Care | Source: Ambulatory Visit | Attending: Neurology | Admitting: Neurology

## 2023-08-06 ENCOUNTER — Telehealth: Payer: Self-pay | Admitting: Neurology

## 2023-08-06 NOTE — Telephone Encounter (Signed)
Pt. Calling for results

## 2023-08-06 NOTE — Telephone Encounter (Signed)
 Called patient and informed him that results can take up to 3 weeks. Patient stated he will try to call [facility] so see if he can expedite it himself. Informed patient that we will contact him as soon as we hear something about his results. Patient verbalized understanding and had no further questions or concerns.

## 2023-08-07 ENCOUNTER — Other Ambulatory Visit: Payer: Self-pay | Admitting: Internal Medicine

## 2023-08-07 ENCOUNTER — Ambulatory Visit: Payer: Self-pay | Admitting: Neurology

## 2023-08-07 ENCOUNTER — Telehealth: Payer: Self-pay | Admitting: Internal Medicine

## 2023-08-07 MED ORDER — AMOXICILLIN-POT CLAVULANATE 875-125 MG PO TABS: 1.0000 | ORAL_TABLET | Freq: Two times a day (BID) | ORAL | 0 refills | Status: AC

## 2023-08-07 MED ORDER — PREDNISONE 10 MG PO TABS: ORAL_TABLET | ORAL | 0 refills | Status: DC

## 2023-08-07 NOTE — Telephone Encounter (Signed)
 Copied from CRM [id number]. Topic: Clinical - Lab/Test Results >> Aug 07, 2023 10:05 AM Dimple Francis wrote: Reason for CRM: Patient has Frequent headaches, brain contrast- CT scan received results back today, wanting Dr Autry Legions to review it and let him know if there is a need for him to come into the office for an appointment with Dr Autry Legions.

## 2023-08-07 NOTE — Telephone Encounter (Signed)
 Ct reviewed -   Pt has evidence for sinus infection  - I have sent antibiotic, and prednisone to the pharmacy.

## 2023-08-10 MED ORDER — METHYLPREDNISOLONE 4 MG PO TBPK: ORAL_TABLET | ORAL | 0 refills | Status: AC

## 2023-08-10 NOTE — Telephone Encounter (Signed)
 Ok to change to medrol  - done erx

## 2023-08-29 ENCOUNTER — Other Ambulatory Visit: Payer: Self-pay | Admitting: Internal Medicine

## 2023-08-31 ENCOUNTER — Other Ambulatory Visit: Payer: Self-pay

## 2023-09-30 ENCOUNTER — Ambulatory Visit (INDEPENDENT_AMBULATORY_CARE_PROVIDER_SITE_OTHER): Payer: Medicare Other | Admitting: Internal Medicine

## 2023-09-30 ENCOUNTER — Ambulatory Visit: Payer: Self-pay | Admitting: Internal Medicine

## 2023-09-30 ENCOUNTER — Encounter: Payer: Self-pay | Admitting: Internal Medicine

## 2023-09-30 VITALS — BP 136/66 | HR 53 | Temp 98.0°F | Ht 70.0 in | Wt 212.0 lb

## 2023-09-30 DIAGNOSIS — E1140 Type 2 diabetes mellitus with diabetic neuropathy, unspecified: Secondary | ICD-10-CM | POA: Diagnosis not present

## 2023-09-30 DIAGNOSIS — E559 Vitamin D deficiency, unspecified: Secondary | ICD-10-CM

## 2023-09-30 DIAGNOSIS — D508 Other iron deficiency anemias: Secondary | ICD-10-CM | POA: Diagnosis not present

## 2023-09-30 DIAGNOSIS — E538 Deficiency of other specified B group vitamins: Secondary | ICD-10-CM

## 2023-09-30 DIAGNOSIS — N1832 Chronic kidney disease, stage 3b: Secondary | ICD-10-CM

## 2023-09-30 DIAGNOSIS — I10 Essential (primary) hypertension: Secondary | ICD-10-CM

## 2023-09-30 DIAGNOSIS — E7800 Pure hypercholesterolemia, unspecified: Secondary | ICD-10-CM | POA: Diagnosis not present

## 2023-09-30 LAB — CBC WITH DIFFERENTIAL/PLATELET
Basophils Absolute: 0.1 K/uL (ref 0.0–0.1)
Basophils Relative: 0.8 % (ref 0.0–3.0)
Eosinophils Absolute: 0.2 K/uL (ref 0.0–0.7)
Eosinophils Relative: 2.7 % (ref 0.0–5.0)
HCT: 39.8 % (ref 39.0–52.0)
Hemoglobin: 13.5 g/dL (ref 13.0–17.0)
Lymphocytes Relative: 15.9 % (ref 12.0–46.0)
Lymphs Abs: 1.2 K/uL (ref 0.7–4.0)
MCHC: 34 g/dL (ref 30.0–36.0)
MCV: 93 fl (ref 78.0–100.0)
Monocytes Absolute: 0.4 K/uL (ref 0.1–1.0)
Monocytes Relative: 4.9 % (ref 3.0–12.0)
Neutro Abs: 5.9 K/uL (ref 1.4–7.7)
Neutrophils Relative %: 75.7 % (ref 43.0–77.0)
Platelets: 260 K/uL (ref 150.0–400.0)
RBC: 4.28 Mil/uL (ref 4.22–5.81)
RDW: 15 % (ref 11.5–15.5)
WBC: 7.8 K/uL (ref 4.0–10.5)

## 2023-09-30 LAB — URINALYSIS, ROUTINE W REFLEX MICROSCOPIC
Bilirubin Urine: NEGATIVE
Hgb urine dipstick: NEGATIVE
Ketones, ur: NEGATIVE
Leukocytes,Ua: NEGATIVE
Nitrite: NEGATIVE
RBC / HPF: NONE SEEN (ref 0–?)
Specific Gravity, Urine: <=1.005 — AB (ref 1.000–1.030)
Total Protein, Urine: NEGATIVE
Urine Glucose: NEGATIVE
Urobilinogen, UA: 0.2 (ref 0.0–1.0)
WBC, UA: NONE SEEN (ref 0–?)
pH: 6 (ref 5.0–8.0)

## 2023-09-30 LAB — HEPATIC FUNCTION PANEL
ALT: 19 U/L (ref 0–53)
AST: 19 U/L (ref 0–37)
Albumin: 3.9 g/dL (ref 3.5–5.2)
Alkaline Phosphatase: 60 U/L (ref 39–117)
Bilirubin, Direct: 0.2 mg/dL (ref 0.0–0.3)
Total Bilirubin: 1.1 mg/dL (ref 0.2–1.2)
Total Protein: 6.6 g/dL (ref 6.0–8.3)

## 2023-09-30 LAB — LIPID PANEL
Cholesterol: 129 mg/dL (ref 0–200)
HDL: 46 mg/dL (ref >39.00)
LDL Cholesterol: 46 mg/dL (ref 0–99)
NonHDL: 82.96
Total CHOL/HDL Ratio: 3
Triglycerides: 184 mg/dL — ABNORMAL HIGH (ref 0.0–149.0)
VLDL: 36.8 mg/dL (ref 0.0–40.0)

## 2023-09-30 LAB — VITAMIN D 25 HYDROXY (VIT D DEFICIENCY, FRACTURES): VITD: 53.98 ng/mL (ref 30.00–100.00)

## 2023-09-30 LAB — BASIC METABOLIC PANEL WITH GFR
BUN: 33 mg/dL — ABNORMAL HIGH (ref 6–23)
CO2: 25 meq/L (ref 19–32)
Calcium: 9.2 mg/dL (ref 8.4–10.5)
Chloride: 105 meq/L (ref 96–112)
Creatinine, Ser: 1.77 mg/dL — ABNORMAL HIGH (ref 0.40–1.50)
GFR: 34.67 mL/min — ABNORMAL LOW (ref >60.00)
Glucose, Bld: 134 mg/dL — ABNORMAL HIGH (ref 70–99)
Potassium: 4.2 meq/L (ref 3.5–5.1)
Sodium: 138 meq/L (ref 135–145)

## 2023-09-30 LAB — IBC PANEL
Iron: 95 ug/dL (ref 42–165)
Saturation Ratios: 34.4 % (ref 20.0–50.0)
TIBC: 275.8 ug/dL (ref 250.0–450.0)
Transferrin: 197 mg/dL — ABNORMAL LOW (ref 212.0–360.0)

## 2023-09-30 LAB — FERRITIN: Ferritin: 38.4 ng/mL (ref 22.0–322.0)

## 2023-09-30 LAB — TSH: TSH: 6.24 u[IU]/mL — ABNORMAL HIGH (ref 0.35–5.50)

## 2023-09-30 LAB — HEMOGLOBIN A1C: Hgb A1c MFr Bld: 6.7 % — ABNORMAL HIGH (ref 4.6–6.5)

## 2023-09-30 LAB — VITAMIN B12: Vitamin B-12: 1081 pg/mL — ABNORMAL HIGH (ref 211–911)

## 2023-09-30 NOTE — Assessment & Plan Note (Signed)
 BP Readings from Last 3 Encounters:  09/30/23 136/66  07/20/23 129/75  06/11/23 126/78   Stable, pt to continue medical treatment norvasc  2.5 every day, losartan  100 every day, toprol  xl 12.5 qd

## 2023-09-30 NOTE — Progress Notes (Signed)
 The test results show that your current treatment is OK, as the tests are stable.  Please continue the same plan.  There is no other need for change of treatment or further evaluation based on these results, at this time.  thanks

## 2023-09-30 NOTE — Assessment & Plan Note (Signed)
 Lab Results  Component Value Date   HGBA1C 6.7 (H) 09/30/2023   Mild ucontrolled, ok for age, pt to continue current medical treatment  - diet, wt control

## 2023-09-30 NOTE — Assessment & Plan Note (Signed)
 Lab Results  Component Value Date   LDLCALC 46 09/30/2023   Stable, pt to continue current statin zetia  10 qd

## 2023-09-30 NOTE — Assessment & Plan Note (Signed)
 No overt bleeding, for iron  lab f/u today

## 2023-09-30 NOTE — Assessment & Plan Note (Signed)
 Lab Results  Component Value Date   CREATININE 1.77 (H) 09/30/2023   Stable overall, cont to avoid nephrotoxins, f/u renal as planned

## 2023-09-30 NOTE — Patient Instructions (Signed)

## 2023-09-30 NOTE — Progress Notes (Signed)
 Patient ID: Nathan Russo, male   DOB: Oct 10, 1940, 83 y.o.   MRN: [id number]        Chief Complaint: follow up HTN, HLD and hyperglycemia, ckd3b, iron  def anemia       HPI:  Nathan Russo is a 83 y.o. male here overall doing ok, Pt denies chest pain, increased sob or doe, wheezing, orthopnea, PND, increased LE swelling, palpitations, dizziness or syncope  Pt denies polydipsia, polyuria, or new focal neuro s/s.    Pt denies fever, wt loss, night sweats, loss of appetite, or other constitutional symptoms    No overt bleeding.  Unable to exercise except for recumbant bike at home, but even that is painful to knees the next day. Has gaines several lbs. Had mild elevated glc to 150 with knee cortisone,   Wt Readings from Last 3 Encounters:  09/30/23 212 lb (96.2 kg)  07/20/23 208 lb (94.3 kg)  06/11/23 208 lb (94.3 kg)   BP Readings from Last 3 Encounters:  09/30/23 136/66  07/20/23 129/75  06/11/23 126/78         Past Medical History:  Diagnosis Date   Abdominal pain, epigastric 04/11/2010   BELCHING 04/23/2010   BRADYCARDIA, CHRONIC 10/24/2006   CARPAL TUNNEL SYNDROME, BILATERAL 02/07/2009   CHEST PAIN-UNSPECIFIED 09/05/2008   CHOLELITHIASIS 04/22/2010   Cholelithiasis 06/13/2010   COLONIC POLYPS, HX OF 04/28/2007   DEGENERATIVE JOINT DISEASE, RIGHT KNEE 10/24/2006   Depression 02/23/2011   DIABETES MELLITUS, TYPE II 04/28/2007   Dizziness and giddiness 12/19/2009   Elevated PSA 06/13/2010   FATIGUE 04/28/2007   GERD 02/11/2009   Headache(784.0) 12/19/2009   HYPERLIPIDEMIA 04/28/2007   HYPERTENSION 10/21/2006   NECK MASS 02/07/2010   OBESITY 10/24/2006   OTITIS MEDIA, ACUTE, LEFT 12/26/2009   PHIMOSIS 02/07/2009   S/P laparoscopic cholecystectomy 10/31/2010   Thyroid  cancer (HCC) 06/13/2010   THYROID  NODULE 02/07/2010   Past Surgical History:  Procedure Laterality Date   CHOLECYSTECTOMY     left knee surgery     right wrist surgury     THYROID  SURGERY      reports that he has quit smoking. He  has never used smokeless tobacco. He reports that he does not drink alcohol and does not use drugs. family history includes Arthritis in his mother; Breast cancer in his sister; Colon cancer (age of onset: 31) in his brother; Diabetes in his brother; Goiter in his mother; Heart attack (age of onset: 77) in his brother; Heart attack (age of onset: 58) in his father; Hypertension in his mother; Hypothyroidism in his sister. Allergies  Allergen Reactions   Diphenoxylate -Atropine  Other (See Comments)   Other Other (See Comments) and Hives    Causes body to ache Causes body to ache   Sulfa Antibiotics Other (See Comments)    As child almost died   Ace Inhibitors     REACTION: cough   Atenolol     REACTION: bradycardia   Codeine Other (See Comments)    Head spins wild   Lovastatin     REACTION: myalygros   Morphine Nausea And Vomiting   Morphine And Codeine Other (See Comments)   Nsaids     Heart patient   Statins Other (See Comments)    Causes body to ache   Sulfamethoxazole Other (See Comments)    Childhood unknown reaction   Sulfasalazine Other (See Comments)    As child almost died   Tramadol      Ants crawling all over  Current Outpatient Medications on File Prior to Visit  Medication Sig Dispense Refill   amiodarone  (PACERONE ) 200 MG tablet Take by mouth.     amLODipine  (NORVASC ) 2.5 MG tablet Take 1 tablet by mouth once daily 90 tablet 3   amoxicillin -clavulanate (AUGMENTIN ) 875-125 MG tablet Take 1 tablet by mouth 2 (two) times daily. 20 tablet 0   apixaban  (ELIQUIS ) 5 MG TABS tablet Take 2.5 mg by mouth 2 (two) times daily.     Ascorbic Acid (VITAMIN C) 1000 MG tablet Take 1,000 mg by mouth daily.     augmented betamethasone  dipropionate (DIPROLENE -AF) 0.05 % ointment betamethasone , augmented 0.05 % topical ointment  APPLY OINTMENT TOPICALLY TO EACH EAR AS NEEDED FOR ITCHING     Blood Glucose Calibration (ACCU-CHEK GUIDE CONTROL) LIQD Use as directed once daily as needed  1 each 3   Blood Glucose Monitoring Suppl (ACCU-CHEK GUIDE ME) w/Device KIT Use as directed once daily E11.9 1 kit 1   Calcium  Carb-Cholecalciferol [phone] MG-UNIT CAPS Take by mouth.     ciclopirox (PENLAC) 8 % solution      Cinnamon 500 MG capsule Take 500 mg by mouth 2 (two) times daily.     Continuous Glucose Receiver (DEXCOM G7 RECEIVER) DEVI Use as directed once daily E11.9 1 each 0   Continuous Glucose Sensor (DEXCOM G7 SENSOR) MISC Use as directed once every 10 days E11.9 9 each 3   cyanocobalamin  1000 MCG tablet Take 1,000 mcg by mouth daily.     ezetimibe  (ZETIA ) 10 MG tablet Take 1 tablet by mouth once daily 90 tablet 0   Flaxseed, Linseed, 1000 MG CAPS Take 1 capsule by mouth daily.     Garlic Oil 500 MG TABS Take by mouth 2 (two) times daily.     Ginger, Zingiber officinalis, (GINGER PO) Take 1 tablet by mouth in the morning and at bedtime.     Glucosamine-Chondroit-Vit C-Mn (GLUCOSAMINE CHONDROITIN COMPLX) CAPS Take by mouth daily.     Glucosamine-Chondroitin 250-200 MG TABS Take 1 tablet by mouth at bedtime.     glucose blood (ACCU-CHEK GUIDE TEST) test strip Use as instructed 4 times every day E11.9 400 each 12   hydrocortisone 2.5 % ointment Apply 1 Application topically 2 (two) times daily.     iron  polysaccharides (NU-IRON ) 150 MG capsule Take 1 capsule (150 mg total) by mouth daily. 90 capsule 1   isosorbide  mononitrate (IMDUR ) 60 MG 24 hr tablet Take by mouth.     Lancets MISC Use as directed three times per day E11.9 300 each 3   levothyroxine  (SYNTHROID ) 125 MCG tablet Take 1 tablet (125 mcg total) by mouth daily. 90 tablet 3   losartan  (COZAAR ) 100 MG tablet Take 1 tablet (100 mg total) by mouth daily. 90 tablet 3   methylPREDNISolone  (MEDROL  DOSEPAK) 4 MG TBPK tablet 4 tab by mouth once daily x 3 days, 2 tabs x 3 days, 1 tab x 3 days 21 tablet 0   metoprolol  succinate (TOPROL -XL) 25 MG 24 hr tablet 12.5 mg daily.     Multiple Vitamin (MULTIVITAMIN) capsule Take 1  capsule by mouth daily.     nitroGLYCERIN  (NITROSTAT ) 0.4 MG SL tablet 0.4 mg every 5 (five) minutes as needed for chest pain.     Omega-3 Fatty Acids (FISH OIL) 1000 MG CAPS Take by mouth 2 (two) times daily.     pantoprazole  (PROTONIX ) 40 MG tablet Take 1 tablet (40 mg total) by mouth 2 (two) times daily. 180 tablet 2  prasugrel  (EFFIENT ) 10 MG TABS tablet Take 10 mg by mouth daily.     tamsulosin  (FLOMAX ) 0.4 MG CAPS capsule Take 1 capsule (0.4 mg total) by mouth daily. 90 capsule 3   triamcinolone  (KENALOG ) 0.025 % cream Apply topically.     VITAMIN D , CHOLECALCIFEROL, PO Take by mouth daily.     No current facility-administered medications on file prior to visit.        ROS:  All others reviewed and negative.  Objective        PE:  BP 136/66   Pulse (!) 53   Temp 98 F (36.7 C) (Temporal)   Ht 5' 10 (1.778 m)   Wt 212 lb (96.2 kg)   SpO2 97%   BMI 30.42 kg/m                 Constitutional: Pt appears in NAD               HENT: Head: NCAT.                Right Ear: External ear normal.                 Left Ear: External ear normal.                Eyes: . Pupils are equal, round, and reactive to light. Conjunctivae and EOM are normal               Nose: without d/c or deformity               Neck: Neck supple. Gross normal ROM               Cardiovascular: Normal rate and regular rhythm.                 Pulmonary/Chest: Effort normal and breath sounds without rales or wheezing.                Abd:  Soft, NT, ND, + BS, no organomegaly               Neurological: Pt is alert. At baseline orientation, motor grossly intact               Skin: Skin is warm. No rashes, no other new lesions, LE edema - none               Psychiatric: Pt behavior is normal without agitation   Micro: none  Cardiac tracings I have personally interpreted today:  none  Pertinent Radiological findings (summarize): none   Lab Results  Component Value Date   WBC 7.8 09/30/2023   HGB 13.5  09/30/2023   HCT 39.8 09/30/2023   PLT 260.0 09/30/2023   GLUCOSE 134 (H) 09/30/2023   CHOL 129 09/30/2023   TRIG 184.0 (H) 09/30/2023   HDL 46.00 09/30/2023   LDLDIRECT 80.0 03/18/2022   LDLCALC 46 09/30/2023   ALT 19 09/30/2023   AST 19 09/30/2023   NA 138 09/30/2023   K 4.2 09/30/2023   CL 105 09/30/2023   CREATININE 1.77 (H) 09/30/2023   BUN 33 (H) 09/30/2023   CO2 25 09/30/2023   TSH 6.24 (H) 09/30/2023   PSA 3.59 05/08/2017   INR 1.0 08/22/2008   HGBA1C 6.7 (H) 09/30/2023   MICROALBUR 0.9 05/17/2009   Assessment/Plan:  Nathan Russo is a 83 y.o. White or Caucasian [1] male with  has a past medical history of Abdominal pain, epigastric (04/11/2010),  BELCHING (04/23/2010), BRADYCARDIA, CHRONIC (10/24/2006), CARPAL TUNNEL SYNDROME, BILATERAL (02/07/2009), CHEST PAIN-UNSPECIFIED (09/05/2008), CHOLELITHIASIS (04/22/2010), Cholelithiasis (06/13/2010), COLONIC POLYPS, HX OF (04/28/2007), DEGENERATIVE JOINT DISEASE, RIGHT KNEE (10/24/2006), Depression (02/23/2011), DIABETES MELLITUS, TYPE II (04/28/2007), Dizziness and giddiness (12/19/2009), Elevated PSA (06/13/2010), FATIGUE (04/28/2007), GERD (02/11/2009), Headache(784.0) (12/19/2009), HYPERLIPIDEMIA (04/28/2007), HYPERTENSION (10/21/2006), NECK MASS (02/07/2010), OBESITY (10/24/2006), OTITIS MEDIA, ACUTE, LEFT (12/26/2009), PHIMOSIS (02/07/2009), S/P laparoscopic cholecystectomy (10/31/2010), Thyroid  cancer (HCC) (06/13/2010), and THYROID  NODULE (02/07/2010).  CKD stage 3b, GFR 30-44 ml/min (HCC) Lab Results  Component Value Date   CREATININE 1.77 (H) 09/30/2023   Stable overall, cont to avoid nephrotoxins, f/u renal as planned  Diabetes mellitus (HCC) Lab Results  Component Value Date   HGBA1C 6.7 (H) 09/30/2023   Mild ucontrolled, ok for age, pt to continue current medical treatment  - diet, wt control   Essential hypertension BP Readings from Last 3 Encounters:  09/30/23 136/66  07/20/23 129/75  06/11/23 126/78   Stable, pt to continue  medical treatment norvasc  2.5 every day, losartan  100 every day, toprol  xl 12.5 qd   Hyperlipidemia Lab Results  Component Value Date   LDLCALC 46 09/30/2023   Stable, pt to continue current statin zetia  10 qd   Iron  deficiency anemia No overt bleeding, for iron  lab f/u today  Followup: Return in about 6 months (around 04/01/2024).  Lynwood Rush, MD 09/30/2023 8:43 PM [facility] - [facility]

## 2023-10-09 ENCOUNTER — Other Ambulatory Visit: Payer: Self-pay | Admitting: Internal Medicine

## 2023-11-06 ENCOUNTER — Encounter: Payer: Self-pay | Admitting: Internal Medicine

## 2023-11-06 ENCOUNTER — Ambulatory Visit (INDEPENDENT_AMBULATORY_CARE_PROVIDER_SITE_OTHER): Admitting: Internal Medicine

## 2023-11-06 VITALS — BP 134/82 | HR 56 | Temp 98.1°F | Ht 70.0 in | Wt 201.0 lb

## 2023-11-06 DIAGNOSIS — N1832 Chronic kidney disease, stage 3b: Secondary | ICD-10-CM

## 2023-11-06 DIAGNOSIS — I10 Essential (primary) hypertension: Secondary | ICD-10-CM

## 2023-11-06 DIAGNOSIS — R079 Chest pain, unspecified: Secondary | ICD-10-CM | POA: Diagnosis not present

## 2023-11-06 DIAGNOSIS — E1140 Type 2 diabetes mellitus with diabetic neuropathy, unspecified: Secondary | ICD-10-CM

## 2023-11-06 DIAGNOSIS — E7800 Pure hypercholesterolemia, unspecified: Secondary | ICD-10-CM

## 2023-11-06 DIAGNOSIS — R269 Unspecified abnormalities of gait and mobility: Secondary | ICD-10-CM

## 2023-11-06 NOTE — Progress Notes (Signed)
 Patient ID: Nathan Russo, male   DOB: 10/23/1940, 83 y.o.   MRN: [id number]        Chief Complaint: follow up chest pain, generalized weakness, ckd3a, dm, htn, hld        HPI:  Nathan Russo is a 83 y.o. male here after seen at ED with right paraspinal pain that seemed to wrap around the right chest to the sternum, now resolved.  No rash.  Pt denies other chest pain, increased sob or doe, wheezing, orthopnea, PND, increased LE swelling, palpitations, dizziness or syncope, though pt states may need PPM eventually per cardiology, to f/u soon.   Pt denies fever, wt loss, night sweats, loss of appetite, or other constitutional symptoms  Does also have worsening bilateral leg and general weakness, has never been able to get PT at home for unclear reason though this has been attempted twice orders.         Wt Readings from Last 3 Encounters:  11/06/23 201 lb (91.2 kg)  09/30/23 212 lb (96.2 kg)  07/20/23 208 lb (94.3 kg)   BP Readings from Last 3 Encounters:  11/06/23 134/82  09/30/23 136/66  07/20/23 129/75         Past Medical History:  Diagnosis Date   Abdominal pain, epigastric 04/11/2010   BELCHING 04/23/2010   BRADYCARDIA, CHRONIC 10/24/2006   CARPAL TUNNEL SYNDROME, BILATERAL 02/07/2009   CHEST PAIN-UNSPECIFIED 09/05/2008   CHOLELITHIASIS 04/22/2010   Cholelithiasis 06/13/2010   COLONIC POLYPS, HX OF 04/28/2007   DEGENERATIVE JOINT DISEASE, RIGHT KNEE 10/24/2006   Depression 02/23/2011   DIABETES MELLITUS, TYPE II 04/28/2007   Dizziness and giddiness 12/19/2009   Elevated PSA 06/13/2010   FATIGUE 04/28/2007   GERD 02/11/2009   Headache(784.0) 12/19/2009   HYPERLIPIDEMIA 04/28/2007   HYPERTENSION 10/21/2006   NECK MASS 02/07/2010   OBESITY 10/24/2006   OTITIS MEDIA, ACUTE, LEFT 12/26/2009   PHIMOSIS 02/07/2009   S/P laparoscopic cholecystectomy 10/31/2010   Thyroid  cancer (HCC) 06/13/2010   THYROID  NODULE 02/07/2010   Past Surgical History:  Procedure Laterality Date   CHOLECYSTECTOMY      left knee surgery     right wrist surgury     THYROID  SURGERY      reports that he has quit smoking. He has never used smokeless tobacco. He reports that he does not drink alcohol and does not use drugs. family history includes Arthritis in his mother; Breast cancer in his sister; Colon cancer (age of onset: 72) in his brother; Diabetes in his brother; Goiter in his mother; Heart attack (age of onset: 43) in his brother; Heart attack (age of onset: 13) in his father; Hypertension in his mother; Hypothyroidism in his sister. Allergies  Allergen Reactions   Diphenoxylate -Atropine  Other (See Comments)   Other Other (See Comments) and Hives    Causes body to ache Causes body to ache   Sulfa Antibiotics Other (See Comments)    As child almost died   Ace Inhibitors     REACTION: cough   Atenolol     REACTION: bradycardia   Codeine Other (See Comments)    Head spins wild   Lovastatin     REACTION: myalygros   Morphine Nausea And Vomiting   Morphine And Codeine Other (See Comments)   Nsaids     Heart patient   Statins Other (See Comments)    Causes body to ache   Sulfamethoxazole Other (See Comments)    Childhood unknown reaction   Sulfasalazine Other (See  Comments)    As child almost died   Tramadol      Ants crawling all over   Current Outpatient Medications on File Prior to Visit  Medication Sig Dispense Refill   amiodarone  (PACERONE ) 200 MG tablet Take by mouth.     amLODipine  (NORVASC ) 2.5 MG tablet Take 1 tablet by mouth once daily 90 tablet 3   amoxicillin -clavulanate (AUGMENTIN ) 875-125 MG tablet Take 1 tablet by mouth 2 (two) times daily. 20 tablet 0   apixaban  (ELIQUIS ) 5 MG TABS tablet Take 2.5 mg by mouth 2 (two) times daily.     Ascorbic Acid (VITAMIN C) 1000 MG tablet Take 1,000 mg by mouth daily.     augmented betamethasone  dipropionate (DIPROLENE -AF) 0.05 % ointment betamethasone , augmented 0.05 % topical ointment  APPLY OINTMENT TOPICALLY TO EACH EAR AS NEEDED  FOR ITCHING     Blood Glucose Calibration (ACCU-CHEK GUIDE CONTROL) LIQD Use as directed once daily as needed 1 each 3   Blood Glucose Monitoring Suppl (ACCU-CHEK GUIDE ME) w/Device KIT Use as directed once daily E11.9 1 kit 1   Calcium  Carb-Cholecalciferol [phone] MG-UNIT CAPS Take by mouth.     ciclopirox (PENLAC) 8 % solution      Cinnamon 500 MG capsule Take 500 mg by mouth 2 (two) times daily.     Continuous Glucose Receiver (DEXCOM G7 RECEIVER) DEVI Use as directed once daily E11.9 1 each 0   Continuous Glucose Sensor (DEXCOM G7 SENSOR) MISC Use as directed once every 10 days E11.9 9 each 3   cyanocobalamin  1000 MCG tablet Take 1,000 mcg by mouth daily.     ezetimibe  (ZETIA ) 10 MG tablet Take 1 tablet by mouth once daily 90 tablet 0   FERREX 150 150 MG capsule Take 1 capsule by mouth once daily 90 capsule 0   Flaxseed, Linseed, 1000 MG CAPS Take 1 capsule by mouth daily.     Garlic Oil 500 MG TABS Take by mouth 2 (two) times daily.     Ginger, Zingiber officinalis, (GINGER PO) Take 1 tablet by mouth in the morning and at bedtime.     Glucosamine-Chondroit-Vit C-Mn (GLUCOSAMINE CHONDROITIN COMPLX) CAPS Take by mouth daily.     Glucosamine-Chondroitin 250-200 MG TABS Take 1 tablet by mouth at bedtime.     glucose blood (ACCU-CHEK GUIDE TEST) test strip Use as instructed 4 times every day E11.9 400 each 12   hydrocortisone 2.5 % ointment Apply 1 Application topically 2 (two) times daily.     isosorbide  mononitrate (IMDUR ) 60 MG 24 hr tablet Take by mouth.     Lancets MISC Use as directed three times per day E11.9 300 each 3   levothyroxine  (SYNTHROID ) 125 MCG tablet Take 1 tablet (125 mcg total) by mouth daily. 90 tablet 3   losartan  (COZAAR ) 100 MG tablet Take 1 tablet (100 mg total) by mouth daily. 90 tablet 3   methylPREDNISolone  (MEDROL  DOSEPAK) 4 MG TBPK tablet 4 tab by mouth once daily x 3 days, 2 tabs x 3 days, 1 tab x 3 days 21 tablet 0   Multiple Vitamin (MULTIVITAMIN) capsule  Take 1 capsule by mouth daily.     nitroGLYCERIN  (NITROSTAT ) 0.4 MG SL tablet 0.4 mg every 5 (five) minutes as needed for chest pain.     Omega-3 Fatty Acids (FISH OIL) 1000 MG CAPS Take by mouth 2 (two) times daily.     pantoprazole  (PROTONIX ) 40 MG tablet Take 1 tablet (40 mg total) by mouth 2 (two) times daily.  180 tablet 2   prasugrel  (EFFIENT ) 10 MG TABS tablet Take 10 mg by mouth daily.     tamsulosin  (FLOMAX ) 0.4 MG CAPS capsule Take 1 capsule (0.4 mg total) by mouth daily. 90 capsule 3   triamcinolone  (KENALOG ) 0.025 % cream Apply topically.     VITAMIN D , CHOLECALCIFEROL, PO Take by mouth daily.     metoprolol  succinate (TOPROL -XL) 25 MG 24 hr tablet 12.5 mg daily. (Patient not taking: Reported on 11/06/2023)     No current facility-administered medications on file prior to visit.        ROS:  All others reviewed and negative.  Objective        PE:  BP 134/82   Pulse (!) 56   Temp 98.1 F (36.7 C)   Ht 5' 10 (1.778 m)   Wt 201 lb (91.2 kg)   SpO2 94%   BMI 28.84 kg/m                 Constitutional: Pt appears in NAD               HENT: Head: NCAT.                Right Ear: External ear normal.                 Left Ear: External ear normal.                Eyes: . Pupils are equal, round, and reactive to light. Conjunctivae and EOM are normal               Nose: without d/c or deformity               Neck: Neck supple. Gross normal ROM               Cardiovascular: Normal rate and regular rhythm.                 Pulmonary/Chest: Effort normal and breath sounds without rales or wheezing.                Abd:  Soft, NT, ND, + BS, no organomegaly               Neurological: Pt is alert. At baseline orientation, motor grossly intact               Skin: Skin is warm. No rashes, no other new lesions, LE edema - trace bilateral               Psychiatric: Pt behavior is normal without agitation   Micro: none  Cardiac tracings I have personally interpreted today:   none  Pertinent Radiological findings (summarize): none   Lab Results  Component Value Date   WBC 7.8 09/30/2023   HGB 13.5 09/30/2023   HCT 39.8 09/30/2023   PLT 260.0 09/30/2023   GLUCOSE 134 (H) 09/30/2023   CHOL 129 09/30/2023   TRIG 184.0 (H) 09/30/2023   HDL 46.00 09/30/2023   LDLDIRECT 80.0 03/18/2022   LDLCALC 46 09/30/2023   ALT 19 09/30/2023   AST 19 09/30/2023   NA 138 09/30/2023   K 4.2 09/30/2023   CL 105 09/30/2023   CREATININE 1.77 (H) 09/30/2023   BUN 33 (H) 09/30/2023   CO2 25 09/30/2023   TSH 6.24 (H) 09/30/2023   PSA 3.59 05/08/2017   INR 1.0 08/22/2008   HGBA1C 6.7 (H) 09/30/2023   MICROALBUR 0.9 05/17/2009  Assessment/Plan:  MANDY FITZWATER is a 83 y.o. White or Caucasian [1] male with  has a past medical history of Abdominal pain, epigastric (04/11/2010), BELCHING (04/23/2010), BRADYCARDIA, CHRONIC (10/24/2006), CARPAL TUNNEL SYNDROME, BILATERAL (02/07/2009), CHEST PAIN-UNSPECIFIED (09/05/2008), CHOLELITHIASIS (04/22/2010), Cholelithiasis (06/13/2010), COLONIC POLYPS, HX OF (04/28/2007), DEGENERATIVE JOINT DISEASE, RIGHT KNEE (10/24/2006), Depression (02/23/2011), DIABETES MELLITUS, TYPE II (04/28/2007), Dizziness and giddiness (12/19/2009), Elevated PSA (06/13/2010), FATIGUE (04/28/2007), GERD (02/11/2009), Headache(784.0) (12/19/2009), HYPERLIPIDEMIA (04/28/2007), HYPERTENSION (10/21/2006), NECK MASS (02/07/2010), OBESITY (10/24/2006), OTITIS MEDIA, ACUTE, LEFT (12/26/2009), PHIMOSIS (02/07/2009), S/P laparoscopic cholecystectomy (10/31/2010), Thyroid  cancer (HCC) (06/13/2010), and THYROID  NODULE (02/07/2010).  Chest pain Atypical, very low suspicion cardiac, now resolved, pt declines further evaluation, plans to f/u cardiology soon  CKD stage 3b, GFR 30-44 ml/min (HCC) Lab Results  Component Value Date   CREATININE 1.77 (H) 09/30/2023   Stable overall, cont to avoid nephrotoxins   Diabetes mellitus (HCC) Lab Results  Component Value Date   HGBA1C 6.7 (H) 09/30/2023    Stable, pt to continue current medical treatment  - diet, wt control   Essential hypertension BP Readings from Last 3 Encounters:  11/06/23 134/82  09/30/23 136/66  07/20/23 129/75   Stable, pt to continue medical treatment norvasc  2.5 mg every day, losartan  100 mg qd   Hyperlipidemia Lab Results  Component Value Date   LDLCALC 46 09/30/2023   Stable, pt to continue zeta 10 mg qd   Gait disorder Worsening recently, now for referral outpatient PT  Followup: No follow-ups on file.  Lynwood Rush, MD 11/07/2023 9:01 PM [facility] [place] [facility] - [facility]

## 2023-11-06 NOTE — Patient Instructions (Addendum)
 Please continue all other medications as before, and refills have been done if requested.  Please have the pharmacy call with any other refills you may need.  Please continue your efforts at being more active, low cholesterol diet, and weight control.  Please keep your appointments with your specialists as you may have planned  You will be contacted regarding the referral for: Physical Therapy at [facility]  No lab work needed today  Please make an Appointment to return in Apr 06 2024, or sooner if needed

## 2023-11-07 NOTE — Assessment & Plan Note (Signed)
 BP Readings from Last 3 Encounters:  11/06/23 134/82  09/30/23 136/66  07/20/23 129/75   Stable, pt to continue medical treatment norvasc  2.5 mg every day, losartan  100 mg qd

## 2023-11-07 NOTE — Assessment & Plan Note (Signed)
 Worsening recently, now for referral outpatient PT

## 2023-11-07 NOTE — Assessment & Plan Note (Signed)
 Lab Results  Component Value Date   HGBA1C 6.7 (H) 09/30/2023   Stable, pt to continue current medical treatment  - diet, wt control

## 2023-11-07 NOTE — Assessment & Plan Note (Signed)
 Lab Results  Component Value Date   CREATININE 1.77 (H) 09/30/2023   Stable overall, cont to avoid nephrotoxins

## 2023-11-07 NOTE — Assessment & Plan Note (Addendum)
 Atypical, very low suspicion cardiac, now resolved, pt declines further evaluation, plans to f/u cardiology soon

## 2023-11-07 NOTE — Assessment & Plan Note (Signed)
 Lab Results  Component Value Date   LDLCALC 46 09/30/2023   Stable, pt to continue zeta 10 mg qd
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
## 2023-11-19 NOTE — Therapy (Unsigned)
 OUTPATIENT PHYSICAL THERAPY LOWER EXTREMITY EVALUATION   Patient Name: Nathan Russo MRN: [id number] DOB:[birth year], 83 y.o., male Today's Date: 11/20/2023  END OF SESSION:  PT End of Session - 11/20/23 0736     Visit Number 1    Number of Visits 5    Date for PT Re-Evaluation 01/20/24    Authorization Type [employer] MCR    Progress Note Due on Visit 10    PT Start Time 0745    PT Stop Time 0830    PT Time Calculation (min) 45 min    Activity Tolerance Patient tolerated treatment well    Behavior During Therapy WFL for tasks assessed/performed          Past Medical History:  Diagnosis Date   Abdominal pain, epigastric 04/11/2010   BELCHING 04/23/2010   BRADYCARDIA, CHRONIC 10/24/2006   CARPAL TUNNEL SYNDROME, BILATERAL 02/07/2009   CHEST PAIN-UNSPECIFIED 09/05/2008   CHOLELITHIASIS 04/22/2010   Cholelithiasis 06/13/2010   COLONIC POLYPS, HX OF 04/28/2007   DEGENERATIVE JOINT DISEASE, RIGHT KNEE 10/24/2006   Depression 02/23/2011   DIABETES MELLITUS, TYPE II 04/28/2007   Dizziness and giddiness 12/19/2009   Elevated PSA 06/13/2010   FATIGUE 04/28/2007   GERD 02/11/2009   Headache(784.0) 12/19/2009   HYPERLIPIDEMIA 04/28/2007   HYPERTENSION 10/21/2006   NECK MASS 02/07/2010   OBESITY 10/24/2006   OTITIS MEDIA, ACUTE, LEFT 12/26/2009   PHIMOSIS 02/07/2009   S/P laparoscopic cholecystectomy 10/31/2010   Thyroid  cancer (HCC) 06/13/2010   THYROID  NODULE 02/07/2010   Past Surgical History:  Procedure Laterality Date   CHOLECYSTECTOMY     left knee surgery     right wrist surgury     THYROID  SURGERY     Patient Active Problem List   Diagnosis Date Noted   Recurrent falls while walking 06/14/2023   Generalized weakness 06/14/2023   ACS (acute coronary syndrome) (HCC) 06/10/2023   TMJ tenderness, left 05/04/2023   Sinusitis 05/04/2023   Low back pain 03/23/2023   BPH (benign prostatic hyperplasia) 03/21/2023   Acquired trigger finger of left little finger 02/25/2023   Acquired trigger  finger of right little finger 02/12/2023   Pain in finger of left hand 02/12/2023   Orthostatic syncope 11/11/2022   Multiple pulmonary nodules 11/11/2022   Iron  deficiency anemia 11/11/2022   Constipation 11/11/2022   Orthostasis 11/09/2022   Hypothyroidism 11/09/2022   Pulmonary nodules 11/09/2022   Fatigue 11/08/2022   Dysuria 11/01/2022   Dermatofibroma of chest 10/30/2022   Melanocytic nevi of trunk 10/30/2022   History of skin cancer 10/30/2022   Nevus of scalp 10/30/2022   Psoriasis 10/30/2022   Seborrheic keratoses 10/30/2022   Stasis dermatitis of both legs 10/30/2022   Gait disorder 10/18/2022   Chest pain 06/02/2022   TMJ arthralgia 03/28/2022   Leg hematoma, right, initial encounter 03/18/2022   Osteoarthritis of left knee 12/17/2021   Right groin pain 11/15/2020   CKD stage 3b, GFR 30-44 ml/min (HCC) 10/24/2020   Iatrogenic hyperthyroidism 10/24/2020   Left rib fracture 10/18/2019   Microhematuria 10/18/2019   Left arm pain 10/14/2019   Rib pain on left side 10/14/2019   Dark urine 10/14/2019   Ischemic dilated cardiomyopathy (HCC) 10/06/2019   Bradycardia 08/15/2019   OSA on CPAP 08/09/2019   Daytime somnolence 07/12/2019   Coronary atherosclerosis of native coronary artery 07/06/2019   Paresthesia and pain of both upper extremities 05/16/2019   Paresthesia of both lower extremities 05/16/2019   Hypersomnolence 05/16/2019   ST elevation (STEMI) myocardial  infarction of unspecified site [facility]) 04/24/2019   Current use of long term anticoagulation 12/03/2018   AKI (acute kidney injury) (HCC) 10/05/2017   Hypokalemia 10/05/2017   Infectious diarrhea 10/05/2017   Slow urinary stream 10/05/2017   Chronic right shoulder pain 09/21/2017   Pain in joint of right shoulder 08/05/2017   Pain in joint of left shoulder 08/05/2017   Neuropathy 07/27/2017   Leg abscess 03/04/2016   Right leg pain 02/29/2016   Cough 02/23/2016   PAT (paroxysmal atrial tachycardia)  (HCC) 09/07/2015   Otitis externa 08/02/2014   Hypersomnia 09/14/2013   Dyspnea on exertion 04/12/2013   Hypotension 04/12/2013   Community acquired pneumonia 04/06/2013   Status post ablation of atrial fibrillation 03/17/2013   Status post ablation of atrial flutter 03/17/2013   Postoperative hypothyroidism 05/05/2012   Papillary carcinoma of thyroid  (HCC) 05/05/2012   Essential hypertension 03/28/2012   Atrial fibrillation and flutter (HCC) 03/27/2012   Pain in wrist 09/03/2011   Vertigo 02/23/2011   Malignant neoplasm of thyroid  gland (HCC) 02/11/2011   Supraventricular arrhythmia 02/06/2011   Chronic pain 12/13/2010   History of cholecystectomy 10/31/2010   Encounter for well adult exam with abnormal findings 10/31/2010   Bladder neck obstruction 06/13/2010   Elevated prostate specific antigen (PSA) 06/13/2010   Gastroesophageal reflux disease 02/11/2009   CARPAL TUNNEL SYNDROME, BILATERAL 02/07/2009   Carpal tunnel syndrome, bilateral 02/07/2009   Diabetes mellitus (HCC) 04/28/2007   Hyperlipidemia 04/28/2007   Malaise and fatigue 04/28/2007   History of colonic polyps 04/28/2007   Adiposity 10/24/2006   BRADYCARDIA, CHRONIC 10/24/2006   Osteoarthritis 10/24/2006   Cardiac arrhythmia 10/24/2006    PCP: Norleen Lynwood ORN, MD  REFERRING PROVIDER: Norleen Lynwood ORN, MD  REFERRING DIAG: R26.9 (ICD-10-CM) - Gait disorder  THERAPY DIAG:  Other abnormalities of gait and mobility - Plan: PT plan of care cert/re-cert  Muscle weakness (generalized) - Plan: PT plan of care cert/re-cert  History of falling - Plan: PT plan of care cert/re-cert  Rationale for Evaluation and Treatment: Rehabilitation  ONSET DATE: chronic  SUBJECTIVE:   SUBJECTIVE STATEMENT: Patient relates a history of gait disturbance due to chronic knee pain leading to LE weakness  PERTINENT HISTORY: Nathan Russo is a 83 y.o. male here after seen at ED with right paraspinal pain that seemed to wrap around  the right chest to the sternum, now resolved.  No rash.  Pt denies other chest pain, increased sob or doe, wheezing, orthopnea, PND, increased LE swelling, palpitations, dizziness or syncope, though pt states may need PPM eventually per cardiology, to f/u soon.   Pt denies fever, wt loss, night sweats, loss of appetite, or other constitutional symptoms  Does also have worsening bilateral leg and general weakness, has never been able to get PT at home for unclear reason though this has been attempted twice orders.   PAIN:  Are you having pain? Yes: NPRS scale: 14/10 Pain location: knees Pain description: ache Aggravating factors: everything Relieving factors: Tylenol   PRECAUTIONS: Fall  RED FLAGS: None   WEIGHT BEARING RESTRICTIONS: No  FALLS:  Has patient fallen in last 6 months? Yes. Number of falls 8-10  OCCUPATION: retired  LIVING ENVIRONMENT: Lives with: lives with their family and lives with their spouse Lives in: House/apartment Stairs: 2 steps to each entrance, uses garage with rail Has following equipment at home: Single point cane  PLOF: Independent  PATIENT GOALS: To prevent falls and improve my balance  NEXT MD VISIT: TBD  OBJECTIVE:  Note: Objective measures were completed at Evaluation unless otherwise noted.  DIAGNOSTIC FINDINGS: none  PATIENT SURVEYS:  Patient-specific activity scoring scheme (Point to one number):  0 represents "unable to perform." 10 represents "able to perform at prior level. 0 1 2 3 4 5 6 7 8 9  10 (Date and Score) Activity Initial  Activity Eval     Walking 5 min  1    Standing 5 min  7    Standing from chair  2     Total score = sum of the activity scores/number of activities Minimum detectable change (90%CI) for average score = 2 points Minimum detectable change (90%CI) for single activity score = 3 points PSFS developed by: Rosalee MYRTIS Marvis KYM Charlet CHRISTELLA., & Binkley, J. (1995). Assessing disability and change on  individual  patients: a report of a patient specific measure. Physiotherapy [place], 47, [phone]. Reproduced with the permission of the authors  Score: 10/30 33% perceived function    MUSCLE LENGTH: N/T  POSTURE: B varus deformity  PALPATION: N/T  LOWER EXTREMITY ROM: WFL for gait and transfers  Active ROM Right eval Left eval  Hip flexion    Hip extension    Hip abduction    Hip adduction    Hip internal rotation    Hip external rotation    Knee flexion    Knee extension    Ankle dorsiflexion    Ankle plantarflexion    Ankle inversion    Ankle eversion     (Blank rows = not tested)  LOWER EXTREMITY MMT:   MMT Right eval Left eval  Hip flexion    Hip extension 4- 4-  Hip abduction    Hip adduction    Hip internal rotation    Hip external rotation    Knee flexion    Knee extension 4- 4-  Ankle dorsiflexion    Ankle plantarflexion    Ankle inversion    Ankle eversion     (Blank rows = not tested)  LOWER EXTREMITY SPECIAL TESTS:  N/T  FUNCTIONAL TESTS:  30 seconds chair stand test 1 TUG 22s w/SPC 2 MWT 161ft with SPC  GAIT: Distance walked: 74ftx2 Assistive device utilized: Single point cane Level of assistance: Complete Independence Comments: slow cadence                                                                                                                                TREATMENT:  [facility] Adult PT Treatment:                                                DATE: 11/20/23  Self Care: Additional minutes spent for educating on updated Therapeutic Home Exercise Program as well as comparing current status to condition at start of symptoms. This included exercises focusing  on stretching, strengthening, with focus on eccentric aspects. Long term goals include an improvement in range of motion, strength, endurance as well as avoiding reinjury. Patient's frequency would include in 1-2 times a day, 3-5 times a week for a duration of 6-12 weeks. Proper  technique shown and discussed handout in great detail. All questions were discussed and addressed.     PATIENT EDUCATION:  Education details: Discussed eval findings, rehab rationale and POC and patient is in agreement  Person educated: Patient Education method: Explanation and Handouts Education comprehension: verbalized understanding and needs further education  HOME EXERCISE PROGRAM: Access Code: [email or web address] URL: [email or web address] Date: 11/20/2023 Prepared by: Reyes Kohut  Exercises - Sit to Stand with Armchair  - 2 x daily - 5 x weekly - 1 sets - 5 reps - Heel Toe Raises with Counter Support  - 2 x daily - 5 x weekly - 1 sets - 15 reps - Knee extension with ball squeeze  - 2 x daily - 5 x weekly - 1 sets - 15 reps  ASSESSMENT:  CLINICAL IMPRESSION: Patient is a 83 y.o. male who was seen today for physical therapy evaluation and treatment for BLE weakness which effects his gait and mobility.   Patient has a history of chronic knee pain and has undergone CSI for relief.  He is unable to continue theses due to blood sugar spikes.  Patient demonstrates strength and function deficits on TUG, 2 MWT and 30s chair stand test.  Overall rehab potential is fair due to ongoing knee symptoms.  OBJECTIVE IMPAIRMENTS: Abnormal gait, decreased activity tolerance, decreased endurance, decreased knowledge of condition, decreased mobility, difficulty walking, decreased strength, postural dysfunction, and pain.   ACTIVITY LIMITATIONS: carrying, lifting, sitting, standing, squatting, and stairs  PERSONAL FACTORS: Age, Past/current experiences, Time since onset of injury/illness/exacerbation, and 1 comorbidity: DM are also affecting patient's functional outcome.   REHAB POTENTIAL: Fair based on ongoing knee symptoms  CLINICAL DECISION MAKING: Evolving/moderate complexity  EVALUATION COMPLEXITY: Moderate   GOALS: Goals reviewed with patient? No    SHORT TERM GOALS=LONG TERM  GOALS: Target date: 01/20/24  Patient will increase 30s chair stand reps from 1 to 4 with/without arms to demonstrate and improved functional ability with less pain/difficulty as well as reduce fall risk.  Baseline: 1 Goal status: INITIAL  2.  Patient will score at least 15/30 on PSFS to signify clinically meaningful improvement in functional abilities.   Baseline: 10/30 Goal status: INITIAL  3.  Patient to demonstrate independence in HEP Baseline: [id number] Goal status: INITIAL  4.  Decrease TUG time to <20s with SPC Baseline: 22s Goal status: INITIAL  5.  Increase 2 MWT distance to 29ft Baseline: 156ft with SPC Goal status: INITIAL     PLAN:  PT FREQUENCY: 1x/week  PT DURATION: 4 weeks  PLANNED INTERVENTIONS: 97110-Therapeutic exercises, 97530- Therapeutic activity, 97112- Neuromuscular re-education, 97535- Self Care, [id number]- Manual therapy, [phone]- Gait training, Patient/Family education, Balance training, and Stair training  PLAN FOR NEXT SESSION: HEP review and update, manual techniques as appropriate, aerobic tasks, ROM and flexibility activities, strengthening and PREs, TPDN, gait and balance training as needed    Date of referral: 11/06/23 Referring provider: Norleen Lynwood ORN, MD Referring diagnosis? R26.9 (ICD-10-CM) - Gait disorder Treatment diagnosis? (if different than referring diagnosis) R26.9 (ICD-10-CM) - Gait disorder  What was this (referring dx) caused by? Ongoing Issue  Lysle of Condition: Chronic (continuous duration > 3 months)   Laterality: Both  Current Functional Measure Score: Other PSFS 10/30  Objective measurements identify impairments when they are compared to normal values, the uninvolved extremity, and prior level of function.  [x]  Yes  []  No  Objective assessment of functional ability: Moderate functional limitations   Briefly describe symptoms: Patient relates a history of gait disturbance due to chronic knee pain leading to LE  weakness  How did symptoms start: Chronic (continuous duration > 3 months)  Average pain intensity:  Last 24 hours: 14/10  Past week: 14/10  How often does the pt experience symptoms? Constantly  How much have the symptoms interfered with usual daily activities? Quite a bit  How has condition changed since care began at this facility? NA - initial visit  In general, how is the patients overall health? Fair   BACK PAIN (STarT Back Screening Tool) No   Reyes CHRISTELLA Kohut, PT 11/20/2023, 9:10 AM

## 2023-11-20 ENCOUNTER — Other Ambulatory Visit: Payer: Self-pay

## 2023-11-20 ENCOUNTER — Ambulatory Visit: Attending: Internal Medicine

## 2023-11-20 DIAGNOSIS — R269 Unspecified abnormalities of gait and mobility: Secondary | ICD-10-CM | POA: Insufficient documentation

## 2023-11-20 DIAGNOSIS — M6281 Muscle weakness (generalized): Secondary | ICD-10-CM | POA: Diagnosis present

## 2023-11-20 DIAGNOSIS — R2689 Other abnormalities of gait and mobility: Secondary | ICD-10-CM | POA: Insufficient documentation

## 2023-11-20 DIAGNOSIS — Z9181 History of falling: Secondary | ICD-10-CM | POA: Diagnosis present

## 2023-11-24 ENCOUNTER — Telehealth: Payer: Self-pay

## 2023-11-24 ENCOUNTER — Ambulatory Visit: Admitting: Neurology

## 2023-11-24 NOTE — Telephone Encounter (Signed)
 Copied from CRM [id number]. Topic: General - Other >> Nov 24, 2023  3:01 PM Chiquita SQUIBB wrote: Reason for CRM: Patient is calling in regarding a message he received on my chart to do his labs that were put in on 04/06, to be done by 10/06. Patient is asking if he should get these done now, as he was not aware of these. Please advise the patient.

## 2023-11-25 ENCOUNTER — Telehealth: Payer: Self-pay | Admitting: Radiology

## 2023-11-25 NOTE — Telephone Encounter (Signed)
 Copied from CRM [id number]. Topic: General - Other >> Nov 24, 2023  3:01 PM Chiquita SQUIBB wrote: Reason for CRM: Patient is calling in regarding a message he received on my chart to do his labs that were put in on 04/06, to be done by 10/06. Patient is asking if he should get these done now, as he was not aware of these. Please advise the patient. >> Nov 25, 2023  1:37 PM Franky GRADE wrote: Patient is calling to follow up on his question to see if the labs need to be done or not.

## 2023-11-28 ENCOUNTER — Other Ambulatory Visit: Payer: Self-pay | Admitting: Internal Medicine

## 2023-12-02 NOTE — Telephone Encounter (Signed)
 Called and spoke with patient. Informed him that lab orders were in placed, however he did not have a follow up with Dr.John in October. His next follow up scheduled would not be until late January. Patient expressed understanding.

## 2023-12-09 ENCOUNTER — Encounter

## 2023-12-16 ENCOUNTER — Encounter

## 2023-12-24 ENCOUNTER — Ambulatory Visit (INDEPENDENT_AMBULATORY_CARE_PROVIDER_SITE_OTHER): Admitting: Emergency Medicine

## 2023-12-24 ENCOUNTER — Ambulatory Visit: Payer: Self-pay

## 2023-12-24 ENCOUNTER — Encounter: Payer: Self-pay | Admitting: Emergency Medicine

## 2023-12-24 VITALS — BP 108/62 | HR 56 | Temp 97.5°F | Ht 70.0 in | Wt 209.0 lb

## 2023-12-24 DIAGNOSIS — S61412A Laceration without foreign body of left hand, initial encounter: Secondary | ICD-10-CM

## 2023-12-24 NOTE — Progress Notes (Signed)
 Nathan Russo 83 y.o.   Chief Complaint  Patient presents with   Hand Injury    Patient here for     HISTORY OF PRESENT ILLNESS: This is a 83 y.o. male sustained a hand injury with skin tears 2 weeks ago Needs referral to wound care center On Eliquis  and Effient   Hand Injury  Pertinent negatives include no chest pain.     Prior to Admission medications   Medication Sig Start Date End Date Taking? Authorizing Provider  amiodarone  (PACERONE ) 200 MG tablet Take by mouth. 05/17/19   [provider]  amLODipine  (NORVASC ) 2.5 MG tablet Take 1 tablet by mouth once daily 06/15/23   Norleen Lynwood ORN, MD  amoxicillin -clavulanate (AUGMENTIN ) 875-125 MG tablet Take 1 tablet by mouth 2 (two) times daily. 08/07/23   Norleen Lynwood ORN, MD  apixaban  (ELIQUIS ) 5 MG TABS tablet Take 2.5 mg by mouth 2 (two) times daily.    [provider]  Ascorbic Acid (VITAMIN C) 1000 MG tablet Take 1,000 mg by mouth daily.    [provider]  augmented betamethasone  dipropionate (DIPROLENE -AF) 0.05 % ointment betamethasone , augmented 0.05 % topical ointment  APPLY OINTMENT TOPICALLY TO EACH EAR AS NEEDED FOR ITCHING    [provider]  Blood Glucose Calibration (ACCU-CHEK GUIDE CONTROL) LIQD Use as directed once daily as needed 03/19/23   Norleen Lynwood ORN, MD  Blood Glucose Monitoring Suppl (ACCU-CHEK GUIDE ME) w/Device KIT Use as directed once daily E11.9 03/19/23   Norleen Lynwood ORN, MD  Calcium  Carb-Cholecalciferol [phone] MG-UNIT CAPS Take by mouth.    [provider]  ciclopirox (PENLAC) 8 % solution  02/19/22   [provider]  Cinnamon 500 MG capsule Take 500 mg by mouth 2 (two) times daily.    [provider]  Continuous Glucose Receiver (DEXCOM G7 RECEIVER) DEVI Use as directed once daily E11.9 11/16/22   Norleen Lynwood ORN, MD  Continuous Glucose Sensor (DEXCOM G7 SENSOR) MISC Use as directed once every 10 days E11.9 11/16/22   Norleen Lynwood ORN, MD  cyanocobalamin  1000 MCG  tablet Take 1,000 mcg by mouth daily.    [provider]  ezetimibe  (ZETIA ) 10 MG tablet Take 1 tablet by mouth once daily 11/30/23   Norleen Lynwood ORN, MD  FERREX 150 150 MG capsule Take 1 capsule by mouth once daily 10/09/23   Norleen Lynwood ORN, MD  Flaxseed, Linseed, 1000 MG CAPS Take 1 capsule by mouth daily.    [provider]  Garlic Oil 500 MG TABS Take by mouth 2 (two) times daily.    [provider]  Ginger, Zingiber officinalis, (GINGER PO) Take 1 tablet by mouth in the morning and at bedtime.    [provider]  Glucosamine-Chondroit-Vit C-Mn (GLUCOSAMINE CHONDROITIN COMPLX) CAPS Take by mouth daily.    [provider]  Glucosamine-Chondroitin 250-200 MG TABS Take 1 tablet by mouth at bedtime. 04/25/19   [provider]  glucose blood (ACCU-CHEK GUIDE TEST) test strip Use as instructed 4 times every day E11.9 04/01/23   Norleen Lynwood ORN, MD  hydrocortisone 2.5 % ointment Apply 1 Application topically 2 (two) times daily.    [provider]  isosorbide  mononitrate (IMDUR ) 60 MG 24 hr tablet Take by mouth. 08/30/19   [provider]  Lancets MISC Use as directed three times per day E11.9 03/18/22   Norleen Lynwood ORN, MD  levothyroxine  (SYNTHROID ) 125 MCG tablet Take 1 tablet (125 mcg total) by mouth daily. 04/06/23  Norleen Lynwood ORN, MD  losartan  (COZAAR ) 100 MG tablet Take 1 tablet (100 mg total) by mouth daily. 06/02/22   Norleen Lynwood ORN, MD  methylPREDNISolone  (MEDROL  DOSEPAK) 4 MG TBPK tablet 4 tab by mouth once daily x 3 days, 2 tabs x 3 days, 1 tab x 3 days 08/10/23   Norleen Lynwood ORN, MD  metoprolol  succinate (TOPROL -XL) 25 MG 24 hr tablet 12.5 mg daily. Patient not taking: Reported on 11/06/2023 04/27/19   [provider]  Multiple Vitamin (MULTIVITAMIN) capsule Take 1 capsule by mouth daily.    [provider]  nitroGLYCERIN  (NITROSTAT ) 0.4 MG SL tablet 0.4 mg every 5 (five) minutes as needed for chest pain. 07/07/19   [provider]  Omega-3 Fatty Acids (FISH OIL) 1000 MG CAPS Take by mouth 2 (two) times daily.    [provider]  pantoprazole  (PROTONIX ) 40 MG tablet Take 1 tablet (40 mg total) by mouth 2 (two) times daily. 04/01/23   Norleen Lynwood ORN, MD  prasugrel  (EFFIENT ) 10 MG TABS tablet Take 10 mg by mouth daily. 04/27/19   [provider]  tamsulosin  (FLOMAX ) 0.4 MG CAPS capsule Take 1 capsule (0.4 mg total) by mouth daily. 03/19/23   Norleen Lynwood ORN, MD  triamcinolone  (KENALOG ) 0.025 % cream Apply topically. 04/25/19   [provider]  VITAMIN D , CHOLECALCIFEROL, PO Take by mouth daily.    [provider]    Allergies  Allergen Reactions   Diphenoxylate -Atropine  Other (See Comments)   Other Other (See Comments) and Hives    Causes body to ache Causes body to ache   Sulfa Antibiotics Other (See Comments)    As child almost died   Ace Inhibitors     REACTION: cough   Atenolol     REACTION: bradycardia   Codeine Other (See Comments)    Head spins wild   Lovastatin     REACTION: myalygros   Morphine Nausea And Vomiting   Morphine And Codeine Other (See Comments)   Nsaids     Heart patient   Statins Other (See Comments)    Causes body to ache   Sulfamethoxazole Other (See Comments)    Childhood unknown reaction   Sulfasalazine Other (See Comments)    As child almost died   Tramadol      Ants crawling all over    Patient Active Problem List   Diagnosis Date Noted   Recurrent falls while walking 06/14/2023   Generalized weakness 06/14/2023   ACS (acute coronary syndrome) (HCC) 06/10/2023   TMJ tenderness, left 05/04/2023   Sinusitis 05/04/2023   Low back pain 03/23/2023   BPH (benign prostatic hyperplasia) 03/21/2023   Acquired trigger finger of left little finger 02/25/2023   Acquired trigger finger of right little finger 02/12/2023   Pain in finger of left hand 02/12/2023   Orthostatic syncope 11/11/2022   Multiple pulmonary nodules 11/11/2022    Iron  deficiency anemia 11/11/2022   Constipation 11/11/2022   Orthostasis 11/09/2022   Hypothyroidism 11/09/2022   Pulmonary nodules 11/09/2022   Fatigue 11/08/2022   Dysuria 11/01/2022   Dermatofibroma of chest 10/30/2022   Melanocytic nevi of trunk 10/30/2022   History of skin cancer 10/30/2022   Nevus of scalp 10/30/2022   Psoriasis 10/30/2022   Seborrheic keratoses 10/30/2022   Stasis dermatitis of both legs 10/30/2022   Gait disorder 10/18/2022   Chest pain 06/02/2022   TMJ arthralgia 03/28/2022   Leg hematoma, right, initial encounter 03/18/2022   Osteoarthritis of left  knee 12/17/2021   Right groin pain 11/15/2020   CKD stage 3b, GFR 30-44 ml/min (HCC) 10/24/2020   Iatrogenic hyperthyroidism 10/24/2020   Left rib fracture 10/18/2019   Microhematuria 10/18/2019   Left arm pain 10/14/2019   Rib pain on left side 10/14/2019   Dark urine 10/14/2019   Ischemic dilated cardiomyopathy (HCC) 10/06/2019   Bradycardia 08/15/2019   OSA on CPAP 08/09/2019   Daytime somnolence 07/12/2019   Coronary atherosclerosis of native coronary artery 07/06/2019   Paresthesia and pain of both upper extremities 05/16/2019   Paresthesia of both lower extremities 05/16/2019   Hypersomnolence 05/16/2019   ST elevation (STEMI) myocardial infarction of unspecified site (HCC) 04/24/2019   Current use of long term anticoagulation 12/03/2018   AKI (acute kidney injury) 10/05/2017   Hypokalemia 10/05/2017   Infectious diarrhea 10/05/2017   Slow urinary stream 10/05/2017   Chronic right shoulder pain 09/21/2017   Pain in joint of right shoulder 08/05/2017   Pain in joint of left shoulder 08/05/2017   Neuropathy 07/27/2017   Leg abscess 03/04/2016   Right leg pain 02/29/2016   Cough 02/23/2016   PAT (paroxysmal atrial tachycardia) 09/07/2015   Otitis externa 08/02/2014   Hypersomnia 09/14/2013   Dyspnea on exertion 04/12/2013   Hypotension 04/12/2013   Community acquired pneumonia 04/06/2013    Status post ablation of atrial fibrillation 03/17/2013   Status post ablation of atrial flutter 03/17/2013   Postoperative hypothyroidism 05/05/2012   Papillary carcinoma of thyroid  (HCC) 05/05/2012   Essential hypertension 03/28/2012   Atrial fibrillation and flutter (HCC) 03/27/2012   Pain in wrist 09/03/2011   Vertigo 02/23/2011   Malignant neoplasm of thyroid  gland (HCC) 02/11/2011   Supraventricular arrhythmia 02/06/2011   Chronic pain 12/13/2010   History of cholecystectomy 10/31/2010   Encounter for well adult exam with abnormal findings 10/31/2010   Bladder neck obstruction 06/13/2010   Elevated prostate specific antigen (PSA) 06/13/2010   Gastroesophageal reflux disease 02/11/2009   CARPAL TUNNEL SYNDROME, BILATERAL 02/07/2009   Carpal tunnel syndrome, bilateral 02/07/2009   Diabetes mellitus (HCC) 04/28/2007   Hyperlipidemia 04/28/2007   Malaise and fatigue 04/28/2007   History of colonic polyps 04/28/2007   Adiposity 10/24/2006   BRADYCARDIA, CHRONIC 10/24/2006   Osteoarthritis 10/24/2006   Cardiac arrhythmia 10/24/2006    Past Medical History:  Diagnosis Date   Abdominal pain, epigastric 04/11/2010   BELCHING 04/23/2010   BRADYCARDIA, CHRONIC 10/24/2006   CARPAL TUNNEL SYNDROME, BILATERAL 02/07/2009   CHEST PAIN-UNSPECIFIED 09/05/2008   CHOLELITHIASIS 04/22/2010   Cholelithiasis 06/13/2010   COLONIC POLYPS, HX OF 04/28/2007   DEGENERATIVE JOINT DISEASE, RIGHT KNEE 10/24/2006   Depression 02/23/2011   DIABETES MELLITUS, TYPE II 04/28/2007   Dizziness and giddiness 12/19/2009   Elevated PSA 06/13/2010   FATIGUE 04/28/2007   GERD 02/11/2009   Headache(784.0) 12/19/2009   HYPERLIPIDEMIA 04/28/2007   HYPERTENSION 10/21/2006   NECK MASS 02/07/2010   OBESITY 10/24/2006   OTITIS MEDIA, ACUTE, LEFT 12/26/2009   PHIMOSIS 02/07/2009   S/P laparoscopic cholecystectomy 10/31/2010   Thyroid  cancer (HCC) 06/13/2010   THYROID  NODULE 02/07/2010    Past Surgical History:  Procedure  Laterality Date   CHOLECYSTECTOMY     left knee surgery     right wrist surgury     THYROID  SURGERY      Social History   Socioeconomic History   Marital status: Married    Spouse name: Hargis   Number of children: 3   Years of education: Not on file  Highest education level: Bachelor's degree (e.g., BA, AB, BS)  Occupational History   Occupation: former Doctor, general practice: RETIRED  Tobacco Use   Smoking status: Former   Smokeless tobacco: Never  Advertising account planner   Vaping status: Never Used  Substance and Sexual Activity   Alcohol use: No    Alcohol/week: 0.0 standard drinks of alcohol   Drug use: No   Sexual activity: Not Currently  Other Topics Concern   Not on file  Social History Narrative   Are you right handed or left handed? Right Handed   Are you currently employed ?    What is your current occupation?   Do you live at home alone? No   Who lives with you? Lives with wife.    What type of home do you live in: 1 story or 2 story? Lives in a two story home with a stair lift        Social Drivers of Health   Financial Resource Strain: Low Risk  (03/27/2023)   Overall Financial Resource Strain (CARDIA)    Difficulty of Paying Living Expenses: Not hard at all  Food Insecurity: Low Risk  (08/14/2023)   Received from [facility]   Hunger Vital Sign    Within the past 12 months, you worried that your food would run out before you got money to buy more: Never true    Within the past 12 months, the food you bought just didn't last and you didn't have money to get more. : Never true  Transportation Needs: No Transportation Needs (03/27/2023)   PRAPARE - Administrator, Civil Service (Medical): No    Lack of Transportation (Non-Medical): No  Physical Activity: Insufficiently Active (03/27/2023)   Exercise Vital Sign    Days of Exercise per Week: 7 days    Minutes of Exercise per Session: 20 min  Stress: No Stress Concern Present  (03/27/2023)   [employer] of Occupational Health - Occupational Stress Questionnaire    Feeling of Stress : Not at all  Recent Concern: Stress - Stress Concern Present (03/18/2023)   [employer] of Occupational Health - Occupational Stress Questionnaire    Feeling of Stress : To some extent  Social Connections: Socially Integrated (03/27/2023)   Social Connection and Isolation Panel    Frequency of Communication with Friends and Family: More than three times a week    Frequency of Social Gatherings with Friends and Family: Once a week    Attends Religious Services: More than 4 times per year    Active Member of [employer] or Organizations: Yes    Attends Banker Meetings: Never    Marital Status: Married  Catering manager Violence: Not At Risk (03/27/2023)   Humiliation, Afraid, Rape, and Kick questionnaire    Fear of Current or Ex-Partner: No    Emotionally Abused: No    Physically Abused: No    Sexually Abused: No    Family History  Problem Relation Age of Onset   Hypertension Mother    Arthritis Mother    Goiter Mother    Heart attack Father 91   Hypothyroidism Sister    Breast cancer Sister    Heart attack Brother 41   Diabetes Brother    Colon cancer Brother 60     Review of Systems  Constitutional: Negative.  Negative for chills and fever.  HENT:  Negative for congestion and sore throat.   Respiratory: Negative.  Negative  for cough and shortness of breath.   Cardiovascular: Negative.  Negative for chest pain and palpitations.  Gastrointestinal:  Negative for abdominal pain, nausea and vomiting.  Genitourinary: Negative.   Neurological: Negative.  Negative for dizziness and headaches.  All other systems reviewed and are negative.   Today's Vitals   12/24/23 1533  BP: 108/62  Pulse: (!) 56  Temp: (!) 97.5 F (36.4 C)  TempSrc: Oral  SpO2: 97%  Weight: 209 lb (94.8 kg)  Height: 5' 10 (1.778 m)   Body mass index is 29.99  kg/m.   Physical Exam Vitals reviewed.  Constitutional:      Appearance: Normal appearance.  HENT:     Head: Normocephalic.  Eyes:     Extraocular Movements: Extraocular movements intact.  Cardiovascular:     Rate and Rhythm: Normal rate.  Pulmonary:     Effort: Pulmonary effort is normal.  Musculoskeletal:     Comments: Left hand: 57-week old lacerations with ecchymotic skin.  Steri-Strips in place.  No signs of infection.  Skin:    General: Skin is warm and dry.  Neurological:     Mental Status: He is alert and oriented to person, place, and time.  Psychiatric:        Mood and Affect: Mood normal.        Behavior: Behavior normal.      ASSESSMENT & PLAN: I personally spent a total of 31 minutes minutes in the care of the patient today including preparing to see the patient, getting/reviewing separately obtained history, performing a medically appropriate exam/evaluation, counseling and educating, placing orders, referring and communicating with other health care professionals, documenting clinical information in the EHR, and coordinating care.  Problem List Items Addressed This Visit       Other   Laceration of left hand without foreign body - Primary   Slowly healing.  No signs of infection today Presently taking blood thinners Recommend evaluation by wound care center Referral placed today.      Relevant Orders   AMB referral to wound care center   Patient Instructions  Wound Care, Adult Taking care of your wound properly can help to prevent pain, infection, and scarring. It can also help your wound heal more quickly. Follow instructions from your health care provider about how to care for your wound. Supplies needed: Soap and water. Wound cleanser, saline, or germ-free (sterile) water. Gauze. If needed, a clean bandage (dressing) or other type of wound dressing material to cover or place in the wound. Follow your health care provider's instructions about what  dressing supplies to use. Cream or topical ointment to apply to the wound, if told by your health care provider. How to care for your wound Cleaning the wound Ask your health care provider how to clean the wound. This may include: Using mild soap and water, a wound cleanser, saline, or sterile water. Using a clean gauze to pat the wound dry after cleaning it. Do not rub or scrub the wound. Dressing care Wash your hands with soap and water for at least 20 seconds before and after you change the dressing. If soap and water are not available, use hand sanitizer. Change your dressing as told by your health care provider. This may include: Cleaning or rinsing out (irrigating) the wound. Application of cream or topical ointment, if told by your health care provider. Placing a dressing over the wound or in the wound (packing). Covering the wound with an outer dressing. Leave stitches (sutures),  staples, skin glue, or adhesive strips in place. These skin closures may need to stay in place for 2 weeks or longer. If adhesive strip edges start to loosen and curl up, you may trim the loose edges. Do not remove adhesive strips completely unless your health care provider tells you to do that. Ask your health care provider when you can leave the wound uncovered. Checking for infection Check your wound area every day for signs of infection. Check for: More redness, swelling, or pain. Fluid or blood. Warmth. Pus or a bad smell.  Follow these instructions at home Medicines If you were prescribed an antibiotic medicine, cream, or ointment, take or apply it as told by your health care provider. Do not stop using the antibiotic even if your condition improves. If you were prescribed pain medicine, take it 30 minutes before you do any wound care or as told by your health care provider. Take over-the-counter and prescription medicines only as told by your health care provider. Eating and drinking Eat a diet  that includes protein, vitamin A, vitamin C, and other nutrient-rich foods to help the wound heal. Foods rich in protein include meat, fish, eggs, dairy, beans, and nuts. Foods rich in vitamin A include carrots and dark green, leafy vegetables. Foods rich in vitamin C include citrus fruits, tomatoes, broccoli, and peppers. Drink enough fluid to keep your urine pale yellow. General instructions Do not take baths, swim, or use a hot tub until your health care provider approves. Ask your health care provider if you may take showers. You may only be allowed to take sponge baths. Do not scratch or pick at the wound. Keep it covered as told by your health care provider. Return to your normal activities as told by your health care provider. Ask your health care provider what activities are safe for you. Protect your wound from the sun when you are outside for the first 6 months, or for as long as told by your health care provider. Cover up the scar area or apply sunscreen that has an SPF of at least 30. Do not use any products that contain nicotine or tobacco. These products include cigarettes, chewing tobacco, and vaping devices, such as e-cigarettes. If you need help quitting, ask your health care provider. Keep all follow-up visits. This is important. Contact a health care provider if: You received a tetanus shot and you have swelling, severe pain, redness, or bleeding at the injection site. Your pain is not controlled with medicine. You have any of these signs of infection: More redness, swelling, or pain around the wound. Fluid or blood coming from the wound. Warmth coming from the wound. A fever or chills. You are nauseous or you vomit. You are dizzy. You have a new rash or hardness around the wound. Get help right away if: You have a red streak of skin near the area around your wound. Pus or a bad smell coming from the wound. Your wound has been closed with staples, sutures, skin glue, or  adhesive strips and it begins to open up and separate. Your wound is bleeding, and the bleeding does not stop with gentle pressure. These symptoms may represent a serious problem that is an emergency. Do not wait to see if the symptoms will go away. Get medical help right away. Call your local emergency services (911 in the [place] Do not drive yourself to the hospital. Summary Always wash your hands with soap and water for at least 20 seconds before  and after changing your dressing. Change your dressing as told by your health care provider. To help with healing, eat foods that are rich in protein, vitamin A, vitamin C, and other nutrients. Check your wound every day for signs of infection. Contact your health care provider if you think that your wound is infected. This information is not intended to replace advice given to you by your health care provider. Make sure you discuss any questions you have with your health care provider. Document Revised: 07/03/2020 Document Reviewed: 07/03/2020 Elsevier Patient Education  2024 Elsevier Inc.   Nathan Schaumann, MD [facility]

## 2023-12-24 NOTE — Telephone Encounter (Signed)
 FYI Only or Action Required?: FYI only for provider.  Patient was last seen in primary care on 11/06/2023 by Norleen Lynwood ORN, MD.  Called Nurse Triage reporting Hand Injury.  Symptoms began several weeks ago.  Interventions attempted: Rest, hydration, or home remedies.  Symptoms are: gradually worsening.  Triage Disposition: See Physician Within 24 Hours  Patient/caregiver understands and will follow disposition?: Yes, will follow disposition  Copied from CRM #[id number]. Topic: Clinical - Red Word Triage >> Dec 24, 2023  8:34 AM Robinson H wrote: [facility] that prompted transfer to Nurse Triage: Hurt hand 2 weeks ago, still pain and bleeding some Reason for Disposition  Can't use injured hand normally (e.g., make a fist, open hand fully, hold a glass of water)  Answer Assessment - Initial Assessment Questions 1. MECHANISM: How did the injury happen?     Trimming bushes, pt states that the cut hand on multiple jagged branches 2. ONSET: When did the injury happen? (e.g., minutes, hours ago)      About 2 weeks ago 3. APPEARANCE of INJURY: What does the injury look like?      Skin tears, with wood splinters 4. SEVERITY: Can you use your hand normally? Can you bend your fingers into a ball and then fully open them?     Pt states that he can use hand, but not as he would normally, pt states there is pulling on the area of the wrist  6. PAIN: How bad is the pain? (Scale 0-10; or none, mild, moderate, severe)     Pt states that it is worse with bending the hand, without bending it pt states that it is a 1 8. OTHER SYMPTOMS: Do you have any other symptoms?      denies  Pt is on blood thinners. Pt states that he is having drainage from the wound that he doesn't feel it is normal drainage. Pt states it has odor. Pt states that he has been to UC multiple times for the hand injury. Pt states that UC has told him to see wound care.  Protocols used: Hand Injury-A-AH

## 2023-12-24 NOTE — Patient Instructions (Signed)

## 2023-12-24 NOTE — Assessment & Plan Note (Signed)
 Slowly healing.  No signs of infection today Presently taking blood thinners Recommend evaluation by wound care center Referral placed today.

## 2023-12-25 ENCOUNTER — Ambulatory Visit: Admitting: Internal Medicine

## 2023-12-25 ENCOUNTER — Ambulatory Visit: Payer: Self-pay

## 2023-12-25 ENCOUNTER — Telehealth: Payer: Self-pay | Admitting: Internal Medicine

## 2023-12-25 NOTE — Telephone Encounter (Signed)
 FYI Only or Action Required?: Action required by provider: referral request and clinical question for provider.  Patient was last seen in primary care on 12/24/2023 by Purcell Emil Schanz, MD.  Called Nurse Triage reporting Wound Infection and Advice Only.  Symptoms began several weeks ago.  Interventions attempted: Nothing.  Symptoms are: unchanged.  Triage Disposition: Home Care  Patient/caregiver understands and will follow disposition?: Yes  PT requesting a call back once this is completed. [facility] in [place]. They have an opening for him for Monday. If not he can't be seen until second week in October.   Copied from CRM #[id number]. Topic: Clinical - Red Word Triage >> Dec 25, 2023 10:16 AM Robinson H wrote: [facility] that prompted transfer to Nurse Triage: Seen yesterday for wound on hand not sure how to care for wound, Reason for Disposition  Minor cut or scratch  Answer Assessment - Initial Assessment Questions Pt was just seen for this yesterday but wanted to know what to do in the mean time. He hasn't been able to change the dressing in 4-5 days. RN did give some advice but pt stated he couldn't get it off if medical staff couldn't get it off. He asked that the referral be expedited to [facility] in [facility]. He states they have an opening for him on Monday at 9am. Otherwise they can't see him until the second week in November. Please assist.  Protocols used: Cuts and Lacerations-A-AH

## 2023-12-25 NOTE — Telephone Encounter (Signed)
 It appaers this was the last referral done, and done per Dr Purcell  I will forward this on to the [facility] as I have nothing else to offer

## 2023-12-25 NOTE — Telephone Encounter (Signed)
 Copied from CRM [phone]. Topic: Referral - Status >> Dec 25, 2023  8:21 AM Amy B wrote: Reason for CRM: Received call from [facility].  Patient went to their office to schedule an appointment but they have not received a referral.  Please fax to [phone].

## 2024-01-13 ENCOUNTER — Encounter (HOSPITAL_BASED_OUTPATIENT_CLINIC_OR_DEPARTMENT_OTHER): Admitting: General Surgery

## 2024-02-05 NOTE — Discharge Summary (Signed)
 [facility] Hospitalist Discharge Summary  Identifying Information:  Nathan Russo [birth year] [id number]  Admit date: 01/31/2024  Discharge date: 02/05/2024  Discharge Service: [facility] Hospitalist  Discharge Attending Physician:Bhaskar Pusuluri, MD  Discharge to: Home with home health services  Discharge Diagnoses: Pyelonephritis Bacteremia with MRSA UTI with MRSA BPH Chronic anemia Multiple pulmonary nodules History of papillary carcinoma of the thyroid  s/p surgery History of CAD s/p stent Paroxysmal A-fib Essential hypertension Type II DM with HbA1c of 6.1% on 01/31/2024 Hyperlipidemia OSA Obesity with BMI of 30 Full code   Hospital Course:   83 years old male with PMH of CAD s/p stent, A-fib, HTN, DM, HLD, papillary thyroid  carcinoma s/p surgery, CKD stage III, OSA, obesity, BPH, admitted on 01/31/2024 with nausea, fever and hematuria.   He is treated for bacteremia and UTI with MRSA.  CT of the abdomen/pelvis revealed bilateral renal cyst, enlarged prostate without hydronephrosis.  Blood cultures x 2 and urine culture positive for MRSA.  Repeat blood cultures from 11/25 are negative.  Echo on 11/25 revealed EF 66%, negative for vegetations.  He was treated with vancomycin IV until 11/27, then switched to daptomycin 500 mg IV daily for 4 weeks (EOT on 03/01/24). PICC line placed on 11/28. Will need CBC with differential CMP and CK checked weekly and results faxed to [phone].      CT of the chest revealed multiple pulmonary nodules and a new nodule in the right fissure.  Given history of papillary thyroid  carcinoma, concern for recurrence or metastasis.  Pulmonology saw him on 11/25 and spoke with his ENT physician who recommended to follow-up as outpatient.  No further workup required in the hospital.   He has chronic A-fib.  His HR is running low, ranging from 49-53.  Decreased amiodarone  200 to 100 mg daily on 11/26.  He is other home HTN medications are  discontinued  Predictive Model Details        22.6% (Medium)  Factor Value   Calculated 02/05/2024 16:01 16% Number of ED visits in last 90 days 3   Readmission Risk Score v2 Model 8% Number of hospitalizations in last year 0    8% Number of active outpatient medication orders 29    7% Latest RDW in last 72 hrs 14 %    5% Latest hemoglobin in last 72 hrs 11.5 g/dL      Post Discharge Follow Up Issues:  Follow-up with PCP in 1 week   Procedures: None  ________________________________________________________________ Discharge Day Services: BP 130/68 (BP Location: Left arm, Patient Position: Lying)   Pulse 58   Temp 98.1 F (36.7 C) (Oral)   Resp 18   Ht 1.803 m (5' 11)   Wt 97 kg (213 lb 13.5 oz)   SpO2 100%   BMI 29.83 kg/m  Pt seen on the day of discharge and determined appropriate for discharge.  GEN: NAD, lying in bed EYES: EOMI ENT: MMM CV: RRR PULM: CTA B ABD: soft, NT/ND, +BS EXT: No edema   Condition at Discharge: fair  Length of Discharge: I spent 45  mins in the discharge of this patient. _____________________________________________________________________________ Discharge Medications: Patient Instructions:    Discharge Medications     Modified Medications      Sig Disp Refill Start End  amiodarone  200 mg tablet Commonly known as: PACERONE  What changed: how much to take  Take 0.5 tablets (100 mg total) by mouth daily.   0     ezetimibe  10 mg tablet Commonly known as: ZETIA   What changed: See the new instructions.  Indications: excessive fat in the blood  90 tablet  0     isosorbide  mononitrate 30 mg 24 hr tablet Commonly known as: IMDUR  What changed:  medication strength how much to take  Take 1 tablet (30 mg total) by mouth daily.  30 tablet  0     * levothyroxine  25 mcg tablet Commonly known as: SYNTHROID  What changed: additional instructions  TAKE 1 TABLET BY MOUTH ON MONDAY, WEDNESDAY AND FRIDAY IN  ADDITION  TO  YOUR   REGULAR  DOSE  30 tablet  0     * levothyroxine  112 mcg tablet Commonly known as: SYNTHROID  What changed: Another medication with the same name was changed. Make sure you understand how and when to take each.  TAKE 1 TABLET BY MOUTH ONCE DAILY BEFORE BREAKFAST  90 tablet  0     losartan  100 mg tablet Commonly known as: COZAAR  What changed: when to take this  Take 1 tablet by mouth once daily  90 tablet  0     prasugreL  HCl 10 mg tablet Commonly known as: EFFIENT  What changed: when to take this  Take 1 tablet by mouth once daily  90 tablet  0        * * There are duplicate medications prescribed to the patient          Medications To Continue      Sig Disp Refill Start End  apixaban  2.5 mg Tab Commonly known as: Eliquis   Take 1 tablet (2.5 mg total) by mouth 2 (two) times a day.  180 tablet  3     ascorbic acid 1,000 mg tablet Commonly known as: VITAMIN C  Take 1,000 mg by mouth daily.   0     calcium  carbonate-vitamin D3 500 mg-3.125 mcg (125 unit) Tab  Take 500 mg by mouth every morning.   0     cinnamon 500 mg Cap  Take 1,000 mg by mouth every morning.   0     cyanocobalamin  1,000 mcg tablet Commonly known as: VITAMIN B12  Take 1,000 mcg by mouth daily.   0     Ferrex 150 150 mg iron  capsule Generic drug: polysaccharide iron  complex  Take 1 capsule by mouth every evening.   0     flaxseed oiL 1,000 mg capsule  Take 1,000 mg by mouth every evening.   0     garlic 1,000 mg capsule  Take 1,000 mg by mouth 2 (two) times a day.   0     ginger (Zingiber officinalis) 550 mg Cap  Take 1 capsule by mouth 2 (two) times a day.   0     hydrocortisone 2.5 % ointment  Apply 1 Application topically as needed.   0     ketoconazole 2 % cream Commonly known as: NIZORAL  Apply 1 Application topically daily.   0     nitroglycerin  0.4 mg SL tablet Commonly known as: NITROSTAT   DISSOLVE ONE TABLET UNDER THE TONGUE EVERY 5 MINUTES AS NEEDED FOR CHEST  PAIN. CALL DR /911 IF 2 DOSES TAKEN. MAX OF 3 PER DAY  25 tablet  0     omega 3-dha-epa-fish oil 1,000 mg capsule Commonly known as: OMEGA 3  Take 1 g by mouth every morning.   0     [place] Tab Generic drug: therapeutic multivitamin  Take 1 tablet by mouth every evening.   0     OSTEO BI-FLEX  TRIPLE STRENGTH ORAL  Take 1 tablet by mouth every evening.   0     pantoprazole  40 mg EC tablet Commonly known as: PROTONIX   Take 1 tablet (40 mg total) by mouth daily.  30 tablet  0     triamcinolone  acetonide 0.025 % cream Commonly known as: KENALOG   Apply 1 Application topically daily as needed.   0         Stopped Medications    amLODIPine  2.5 mg tablet Commonly known as: NORVASC    cefPODOXime 200 mg tablet Commonly known as: VANTIN   metFORMIN  500 mg 24 hr tablet Commonly known as: GLUCOPHAGE -XR       Unreviewed Medications      Sig Disp Refill Start End  cetirizine 10 mg tablet Commonly known as: ZyrTEC  Take 10 mg by mouth every evening.   0     ciclopirox 8 % solution Commonly known as: PENLAC    0          _____________________________________________________________________________ Pending Test Results (if blank, then none): Pending Labs     Order Current Status   Blood Culture, 1st Set Preliminary result        Most Recent Labs:     Lab Results  Component Value Date   WBC 6.64 02/04/2024   HGB 11.5 (L) 02/04/2024   HCT 33.5 (L) 02/04/2024   PLT 230 02/04/2024    Lab Results  Component Value Date   CO2 23 02/04/2024   BUN 29 (H) 02/04/2024   GLUCOSE 150 (H) 02/04/2024   CREATININE 1.48 (H) 02/04/2024   CALCIUM  8.8 02/04/2024   ALBUMIN 4.0 01/31/2024   AST 19 01/31/2024   ALT 18 01/31/2024    Lab Results  Component Value Date   NA 140 02/04/2024   K 3.7 02/04/2024   CL 111 (H) 02/04/2024   CO2 23 02/04/2024   BUN 29 (H) 02/04/2024   CREATININE 1.48 (H) 02/04/2024   CALCIUM  8.8 02/04/2024   MG 2.0 02/03/2024    PHOS 2.9 10/11/2022    Lab Results  Component Value Date   BILITOT 1.6 (H) 01/31/2024   BILIDIR 0.2 12/18/2020   PROT 6.9 01/31/2024   ALBUMIN 4.0 01/31/2024   ALT 18 01/31/2024   AST 19 01/31/2024    Lab Results  Component Value Date   INR 1.7 (H) 10/06/2022   APTT 32.7 10/01/2021   Hospital Radiology:  IR PICC Placement Result Date: 02/05/2024 IR PICC PLACEMENT, 02/05/2024 4:00 PM INDICATION MRSA bacteremia pyelonephritis. Request for PICC line for prolonged intravenous antibiotics. EXAMINATION: ULTRASOUND AND FLUOROSCOPIC GUIDED PICC INSERTION COMPARISON: None MEDICATIONS: 1 mL 1 % Lidocaine  without epinephrine ANESTHESIA/SEDATION: None CONTRAST: None FLUOROSCOPY TIME: Radiation exposure index 9 mGy COMPLICATIONS:None PROCEDURE: Informed consent was obtained from the patient following an explanation of the procedure, risks, benefits and alternatives.  All questions were addressed.  The right upper extremity was prepped and draped in the usual sterile fashion, and a sterile drape was applied covering the operative field.  Maximum barrier sterile technique with sterile gowns and gloves were used for the procedure. A timeout was performed prior to the initiation of the procedure.   Under direct ultrasound guidance, the basilic vein was accessed with a micropuncture kit after the overlying soft tissues were anesthetized with 1% lidocaine . Real-time ultrasound guidance was utilized for vascular access including the acquisition of a permanent ultrasound image documenting patency of the accessed vessel.   A guidewire was utilized for measurement purposes and the PICC line  was cut to length. A peel-away sheath was placed and a 38 cm, 5 French, dual lumen PICC was advanced to the level of the superior caval-atrial junction.  A post procedure spot fluoroscopic was obtained.  The catheter was aspirated and flushed and was secured in place with stat lock device.  A dressing was applied. The patient  tolerated the procedure well without immediate post procedural complication.   Successful ultrasound and fluoroscopic guided placement of a right basilic vein approach, 38 cm, 5 French, dual lumen PICC with tip terminating within the superior caval-atrial junction. The PICC is ready for immediate use. This procedure was performed by Franky Rusk, PA-C under the supervision of Dr. Gordy Roulette  Transthoracic echo (TTE) complete Result Date: 02/02/2024                                                                                                     Version  1                                                                                                     Study ID  [id number]                                                                  +--------------------------------------------------+                           +----------+                                                                                                                                             [facility]                                       +--------------------------------------------------+                                                                                                                                           +----------+ [facility] [address] Transthoracic Echocardiogram Report Name  COTE, MAYABB                             Study Date  02-02-2024, 3  48 PM                 Height  70.98 in MRN  [id number]                                        Patient Location  [facility]                     Weight  213.848 lb DOB  27-Apr-1940  [id number]                        Birth Gender  Male                               BSA  2.17 m Age  83 Years                                       Ethnicity  1                                    BP  135 - 64 mmHg                                                                                                      HR  65 bpm Reason For Study  Bacteremia Ordering Physician  DONAVAN BURKITT Performed By  ZELL DENNIS Referring Physician  BHUSAL, YOGESH PROCEDURE Image Quality  Technically adequate. The left ventricular size is normal. Mild left ventricular hypertrophy LV ejection fraction = 60-65%. The right ventricle is borderline dilated. The right ventricular systolic function is normal. There is no aortic stenosis. There is no aortic regurgitation. There is mild mitral regurgitation. There is mild tricuspid regurgitation. Mild pulmonic valvular regurgitation. IVC size was normal. There is trivial pericardial effusion vs Pericardial fat pad. There are no echocardiographic indications of cardiac tamponade. No apparent echo evidence of valvular vegetations. If clinical suspicion remains, please consider other image modalities. LEFT VENTRICLE The left ventricular size is normal. Mild left ventricular hypertrophy. LV ejection fraction = 60-65%. RIGHT VENTRICLE The right ventricle is borderline dilated. The right ventricular systolic function is normal. LEFT ATRIUM The left atrial size is normal. RIGHT ATRIUM Right atrial size is normal. There is no Doppler evidence for a patent foramen ovale. AORTIC VALVE Aortic valve calcification. There is no aortic stenosis. There is no aortic regurgitation. There is no aortic valvular vegetation. MITRAL VALVE There is mild mitral annular calcification. There is mild mitral regurgitation. There is no vegetation seen on the mitral valve. TRICUSPID VALVE Structurally normal tricuspid valve. There is mild tricuspid regurgitation. Estimated right ventricular systolic pressure is 30 mmHg. PULMONIC VALVE Structurally normal pulmonic valve. Mild pulmonic valvular regurgitation. There is no pulmonic valvular stenosis. There is no vegetation on the pulmonic valve. ARTERIES The aortic sinus is normal  size. The ascending aorta is normal size. VENOUS IVC size was normal. EFFUSION There is trivial pericardial effusion. Pericardial fat pad is noted. There are no echocardiographic indications of cardiac tamponade. MMode-2D Measurements & Calculations asc Aorta Diam  3.2 cm                EDV MOD-sp2   155.0 ml                EDV MOD-sp4   161.0 ml  EF A4C  69.6 % ESV MOD-sp2   47.0 ml                 ESV MOD-sp4   49.0 ml                 IVSd  1.42 cm           LA dim  4.4 cm LA ESV  BP   67.9 ml                  LA ESV Index  A2C   32.6 ml-m       LA ESV Index  A4C   30.0 ml-m        LA ESV Index  BP   31.3 ml-m LVIDd  3.9 cm                          LVIDs  2.7 cm                          LVPWd  1.32 cm        SV A4C  112.0 ml Doppler Measurements & Calculations E-Lat E  14.5                        E-Med E  17.8                         Lat Peak E  Vel  6.4 cm-sec            Med Peak E  Vel  5.2 cm-sec MV A max vel  80.7 cm-sec              MV dec time  0.28 sec                  MV E max vel  92.8 cm-sec               PA max PG  5.5 mmHg RAP systole  8.0 mmHg                 TR max PG  22.1 mmHg                   TR max vel  235.3 cm-sec Other Measurements & Calculations BSA  2.17 m                          MV E-A  1.15                          RVSP TR   30.1 mmHg     SI MOD-sp4   51.6 ml-m SV MOD-sp4   112.0 ml ______________________________________________________________________________                                         MD Gerhardt Mana, MD, [phone]       02-02-2024, 4  54 PM  CT Chest WO Contrast Result Date: 02/01/2024 Date of Service: 2024-01-31 12:51:00 EXAM: CT Chest Without Intravenous Contrast. CLINICAL HISTORY: Pulmonary nodules, fever, leukocytosis. TECHNIQUE: Axial computed tomography images of the chest without intravenous contrast. COMPARISON: 04/07/23. FINDINGS: There are multiple surgical clips in the thyroid  bed and left side of neck. Dense coronary artery calcifications are noted.  LUNGS: There are numerous bilateral variable size pulmonary nodules too many to compare one by one. However, some of the nodules are minimally larger than prior and a new nodular area on the right fissure image 102.  The new nodule is 1.8 cm image 102 series 4. There is a lingula nodule measuring 1.1 cm image 132 compared to 1 cm previously. There is a 1.1 cm nodule in the left lower lobe image 156 compared to 9.6 mm previously. A 1.4 cm right lower lobe nodule on image 161 measured 1.2 cm on the prior study. Mosaic attenuation seen suggestive of small airway disease, air trapping. PLEURAL SPACES: No pleural effusion. No pneumothorax. HEART AND MEDIASTINUM: No cardiomegaly. No significant pericardial effusion. LYMPH NODES: There are small paratracheal and subcarinal lymph nodes, similar to prior. Calcified subcarinal and left hilar lymph nodes are seen, suggestive of remote granulomatous disease. CHEST WALL AND UPPER ABDOMEN: There are cholecystectomy clips. There is some fat stranding or fluid in the left side of upper abdomen, partially seen.  The chest wall is unremarkable. There are calcifications in the spleen suggestive of remote granulomatous disease. BONES: No acute osseous abnormality.   Numerous bilateral variable size pulmonary nodules too many to compare one by one. However, some of the nodules are minimally larger than prior and a new nodular area on the right fissure, as detailed above. Follow up as indicated clinically. Findings could be due to metastatic disease, infection. Mosaic attenuation seen suggestive of small airway  disease, air trapping. Electronically signed by: Almetta Cater, MD on 02/01/2024 03:44:17 PM US Robinette Per PQRS, all CT exams are performed using one or more of the following dose reduction techniques: automated exposure control, adjustment of the mA and/or kV according to patient size, or use of iterative reconstruction technique.   ECG 12 lead Result Date:  02/01/2024 Ventricular Rate                   80        BPM                 Atrial Rate                        80        BPM                 P-R Interval                       306       ms                  QRS Duration                       102       ms                  Q-T Interval                       494       ms                  QTC Calculation Bazett             569       ms                  Calculated P Axis                  78        degrees             Calculated R Axis                  -46       degrees             Calculated T Axis                  9         degrees             Sinus rhythm First degree A-V block Left axis deviation Septal infarct  cited on or before 08-Jul-2019 When compared with ECG of 19-Jan-2024 09 45, Sinus rhythm has replaced Junctional rhythm Vent. rate has increased by  28 bpm Confirmed by Rojelio Dunnings  [phone]  on 02-01-2024 7 53 24 AM  CT Stone Search WO Contrast Result Date: 01/31/2024 Date of Service: 2024-01-31 09:18:00 EXAM: CT Abdomen and Pelvis Without Intravenous Contrast CLINICAL HISTORY: Flank pain, kidney stone suspected. TECHNIQUE: Axial computed tomography images of the abdomen and pelvis without intravenous contrast. CONTRAST: None. COMPARISON: CT/SR - ABDOMEN,ABD_PEL_WO (ADULT) - 04/07/2023 04:21 PM EST FINDINGS: LUNG BASES: Multiple noncalcified parenchymal lung nodules are again identified in the included lower lungs. There is also a pleural-based nodular density peripherally right lower lobe, appears increased in  size compared to the prior exam now measuring 16.4 mm as visualized series 2 image 21 compared to a prior measurement of 14.1 mm. The small nodule peripherally in the left lower lobe visualized series 2 image 21 now measures 6.9 mm compared to previous measurement of 5.6 mm series 2 image 10. The nodular density adjacent to the left hemidiaphragm series 2 image 18 measures 10.6 mm in maximal transverse dimension compared to a previous measurement of  8.3 mm as seen on series 2 image 8 on the prior exam. The heart is at the upper limits of normal. No pericardial effusion. Coronary artery calcification and stents noted. LIVER: Unremarkable. GALLBLADDER AND BILE DUCTS: Surgically absent gallbladder. PANCREAS: Unremarkable. SPLEEN: Granulomatous calcifications in the spleen. Spleen otherwise unremarkable. ADRENAL GLANDS: Unremarkable. KIDNEYS, URETERS, AND BLADDER: Bilateral perinephric stranding again noted, this does not appear appreciably changed. Likely cyst midpole right kidney. Likely cyst mid to lower pole left kidney again noted. No obstructive change either kidney or ureteral calculus. Urinary bladder is unremarkable. STOMACH AND BOWEL: No obstruction. No wall thickening. No CT evidence of colitis or acute diverticulitis. APPENDIX: No CT evidence for appendicitis. PERITONEUM: No free fluid. No free air. LYMPH NODES: No lymphadenopathy. REPRODUCTIVE: Enlarged prostate. VASCULATURE: Atherosclerotic change in the nondilated aorta. ABDOMINAL WALL AND SOFT TISSUES: Unremarkable. BONES: Degenerative changes lumbar spine. Vacuum phenomena in the L2-L3, L3-L4 and L4-L5 discs with endplate sclerosis L4-L5. No acute osseous abnormality.   1. Noncalcified nodules in the included lower lungs a few of which appear increased in size slightly compared to the prior exam. Again recommend follow-up chest CT if this has not been previously obtained. 2. No obstructive change either kidney. No appreciable change in the kidneys compared to the prior exam. Bilateral renal cysts. Perinephric stranding and fluid again noted, appears essentially stable compared to the prior exam. No evidence of ureteral calculus. 3. Enlarged prostate impressing upon the inferior aspect of the urinary bladder. This is a similar finding to the prior exam. Electronically signed by: Maryelizabeth Ink, MD on 01/31/2024 11:05:45 AM US Robinette Per PQRS, all CT exams are performed using one or more of the  following dose reduction techniques: automated exposure control, adjustment of the mA and/or kV according to patient size, or use of iterative reconstruction technique.   CT Head WO Contrast W Quant CT Tiss Character When Performed Result Date: 01/19/2024 CT HEAD WITHOUT CONTRAST, 01/19/2024 11:16 AM INDICATION: Dizziness, persistent/recurrent, cardiac or vascular cause suspected COMPARISON: MRI brain 11/15/2021 TECHNIQUE: Axial CT images of the brain from skull base to vertex, including portions of the face and sinuses, were obtained without contrast. Supplemental 2D reformatted images were generated and reviewed as needed. All CT scans at [facility] and [facility] are performed using radiation dose optimization techniques as appropriate to a performed exam, including but not limited to one or more of the following: automatic exposure control, adjustment of the mA and/or kV according to patient size, use of iterative reconstruction technique. In addition, our institution participates in a radiation dose monitoring program to optimize patient radiation exposure. FINDINGS: Calvarium/skull base: No evidence of acute fracture or destructive lesion. Mastoids and middle ears demonstrate no substantial mucosal disease. Paranasal sinuses: No air fluid levels. Brain: No acute large vascular territory infarct. No mass effect. No hydrocephalus. No acute hemorrhage. Generalized cerebral and cerebellar volume loss with ex vacuo enlargement of the ventricular system. Subcortical and periventricular white matter hypoattenuation that is nonspecific but likely related to small vessel  disease. Intracranial atherosclerosis.   No acute intracranial abnormality. However, CT is relatively insensitive for the detection of acute infarct within the first 24-48 hours, and MRI may be indicated if there is high clinical suspicion.  ECG 12 lead Result Date: 01/19/2024 Ventricular Rate                    52        BPM                 QRS Duration                       102       ms                  Q-T Interval                       508       ms                  QTC Calculation Bazett             472       ms                  Calculated R Axis                  -40       degrees             Calculated T Axis                  -2        degrees             Sinus [place] with First degree A-V block Left axis deviation Low voltage QRS, consider pulmonary disease or obesity Inferior infarct  cited on or before 08-Jul-2019 Cannot rule out Anteroseptal infarct  cited on or before 08-Jul-2019 When compared with ECG of 01-Nov-2023 10 28, No significant change Confirmed by Rojelio Dunnings  [phone]  on 01-19-2024 11 11 56 AM   _____________________________________________________________________________ Discharge Instructions:   Discharge Orders     Discharge Diet (specify)     Details:    Diet type: Consistent Carbohydrate   Carbohydrate Restriction: Med (45-60 gm/meal)   Full Code     Home Health Skilled Nursing     Details:    Home Health Skilled Nursing Service:  Medication Education     Medication Education: Education on IV/IM/SQ Administration   Outpatient Parenteral Antimicrobial Therapy: Yes - See OPAT Orders   Education Needed: Disease Process Education   Comments: ID to determine antibiotic based on final cultures; ID knows patient needs IV antibiotics.  OPAT orders to be entered on 02/05/24   Home Health Skilled Nursing     Details:    Home Health Skilled Nursing Service: Medication   Medication Education: Observation and Assessment with Medication Education and Mgmt   Comments: daptomycin 500 mg IV daily until March 01, 2024.  Will need CBC with differential CMP and CK checked weekly and results faxed to [phone].  PICC line care weekly   Lifting Limits:     Details:    Lifting Limits: No lifting limits        Future Appointments  Date Time Provider Department Center   02/17/2024  9:30 AM Lynwood JONETTA Inks, MD [facility] MPM ENT [facility] MP [facility]  03/29/2024  3:15 PM Elspeth Lorrene Kitten, MD [facility] CAR HP [facility] [address] [facility]  05/11/2024  2:30 PM Samandra Flavia Bracket, NP [facility] CAR HP [facility] [address]         *Some images could not be shown.

## 2024-02-08 ENCOUNTER — Encounter: Payer: Self-pay | Admitting: Internal Medicine

## 2024-02-08 ENCOUNTER — Telehealth: Payer: Self-pay | Admitting: *Deleted

## 2024-02-08 DIAGNOSIS — R296 Repeated falls: Secondary | ICD-10-CM

## 2024-02-08 DIAGNOSIS — J449 Chronic obstructive pulmonary disease, unspecified: Secondary | ICD-10-CM

## 2024-02-08 DIAGNOSIS — R269 Unspecified abnormalities of gait and mobility: Secondary | ICD-10-CM

## 2024-02-08 NOTE — Transitions of Care (Post Inpatient/ED Visit) (Signed)
 02/08/2024  Name: Nathan Russo MRN: [id number] DOB: December 29, 1940  Today's TOC FU Call Status: Today's TOC FU Call Status:: Successful TOC FU Call Completed TOC FU Call Complete Date: 02/08/24  Patient's Name and Date of Birth confirmed. Name, DOB  Transition Care Management Follow-up Telephone Call Date of Discharge: 02/05/24 Discharge Facility: Other [facility]) Name of Other (Non-[facility]) Discharge Facility: [facility] Type of Discharge: Inpatient Admission Primary Inpatient Discharge Diagnosis:: pyleonephritis/ UTI with MRSA bacteremia How have you been since you were released from the hospital?: Better (I am fine, my daughter is here with me taking care of the IV antibiotic- the nurse from [facility] is also coming.  My wife is currently in a SNF and so I am always going over to see her and make sure she is getting good care.  Things with me are okay) Any questions or concerns?: No  Items Reviewed: Did you receive and understand the discharge instructions provided?: Yes (briefly reviewed with patient who verbalizes good understanding of same - outside hospital AVS) Medications obtained,verified, and reconciled?: No (Patient declined medication reconciliation/ review; confirmed self-administering IV antibiotics as instructed; self-manages medications and denies questions/ concerns around medications today; daughter administering IV antibiotics) Medications Not Reviewed Reasons:: Other: (Patient declined medication review: stated home health nurse has already reviewed and is on her way to his home now for ongoing care) Any new allergies since your discharge?: No Dietary orders reviewed?: Yes Type of Diet Ordered:: Healthy Do you have support at home?: Yes People in Home [RPT]: child(ren), adult Name of Support/Comfort Primary Source: Reports independent in self-care activities; currently resides with supportive daughter- assists as/ if needed/ indicated; spouse  currently in SNF for short-term rehab after recent hip fracture per patient report  Medications Reviewed Today: Medications Reviewed Today     Reviewed by Laury Huizar M, RN (Registered Nurse) on 02/08/24 at [phone]  Med List Status: <None>   Medication Order Taking? Sig Documenting Provider Last Dose Status Informant  amiodarone  (PACERONE ) 200 MG tablet [id number]  Take by mouth. [provider]  Active Self           Med Note ROLENE EVERN DEVERE DELENA Austin Nov 09, 2022  7:17 PM)    amLODipine  (NORVASC ) 2.5 MG tablet [id number]  Take 1 tablet by mouth once daily Norleen Lynwood ORN, MD  Active   amoxicillin -clavulanate (AUGMENTIN ) 875-125 MG tablet [id number]  Take 1 tablet by mouth 2 (two) times daily. Norleen Lynwood ORN, MD  Active   apixaban  (ELIQUIS ) 5 MG TABS tablet [id number]  Take 2.5 mg by mouth 2 (two) times daily. [provider]  Active Self  Ascorbic Acid (VITAMIN C) 1000 MG tablet [id number]  Take 1,000 mg by mouth daily. [provider]  Active Self  augmented betamethasone  dipropionate (DIPROLENE -AF) 0.05 % ointment [id number]  betamethasone , augmented 0.05 % topical ointment  APPLY OINTMENT TOPICALLY TO EACH EAR AS NEEDED FOR ITCHING [provider]  Active Self  Blood Glucose Calibration (ACCU-CHEK GUIDE CONTROL) LIQD [id number]  Use as directed once daily as needed Norleen Lynwood ORN, MD  Active   Blood Glucose Monitoring Suppl (ACCU-CHEK GUIDE ME) w/Device KIT [id number]  Use as directed once daily E11.9 Norleen Lynwood ORN, MD  Active   Calcium  Carb-Cholecalciferol [phone] MG-UNIT CAPS [id number]  Take by mouth. [provider]  Active Self  ciclopirox (PENLAC) 8 % solution [id number]   [provider]  Active Self  Cinnamon 500 MG capsule [id number]  Take 500  mg by mouth 2 (two) times daily. [provider]  Active Self  Continuous Glucose Receiver WILMOT G7 [place]) [place] [id number]  Use as directed once daily E11.9 Norleen Lynwood ORN, MD  Active    Continuous Glucose Sensor WILMOT PANTHER [place]) MISC [id number]  Use as directed once every 10 days E11.9 Norleen Lynwood ORN, MD  Active   cyanocobalamin  1000 MCG tablet [id number]  Take 1,000 mcg by mouth daily. [provider]  Active Self  ezetimibe  (ZETIA ) 10 MG tablet [id number]  Take 1 tablet by mouth once daily Norleen Lynwood ORN, MD  Active   FERREX 150 150 MG capsule [id number]  Take 1 capsule by mouth once daily Norleen Lynwood ORN, MD  Active   Flaxseed, Linseed, 1000 MG CAPS [id number]  Take 1 capsule by mouth daily. [provider]  Active Self  Garlic Oil 500 MG TABS [id number]  Take by mouth 2 (two) times daily. [provider]  Active Self  Ginger, Zingiber officinalis, (GINGER PO) [id number]  Take 1 tablet by mouth in the morning and at bedtime. [provider]  Active Self  Glucosamine-Chondroit-Vit C-Mn (GLUCOSAMINE CHONDROITIN COMPLX) CAPS [id number]  Take by mouth daily. [provider]  Active Self  Glucosamine-Chondroitin 250-200 MG TABS [id number]  Take 1 tablet by mouth at bedtime. [provider]  Active Self  glucose blood (ACCU-CHEK GUIDE TEST) test strip [id number]  Use as instructed 4 times every day E11.9 Norleen Lynwood ORN, MD  Active   hydrocortisone 2.5 % ointment [id number]  Apply 1 Application topically 2 (two) times daily. [provider]  Active Self  isosorbide  mononitrate (IMDUR ) 60 MG 24 hr tablet [id number]  Take by mouth. [provider]  Active Self  Lancets MISC [id number]  Use as directed three times per day E11.9 Norleen Lynwood ORN, MD  Active Self  levothyroxine  (SYNTHROID ) 125 MCG tablet [id number]  Take 1 tablet (125 mcg total) by mouth daily. Norleen Lynwood ORN, MD  Active   losartan  (COZAAR ) 100 MG tablet [id number]  Take 1 tablet (100 mg total) by mouth daily. Norleen Lynwood ORN, MD  Active Self  methylPREDNISolone  (MEDROL  DOSEPAK) 4 MG TBPK tablet [id number]  4 tab by mouth once daily x 3 days, 2 tabs x 3 days, 1 tab x 3 days Norleen Lynwood ORN, MD  Active   metoprolol  succinate (TOPROL -XL) 25 MG 24 hr tablet [id number]  12.5 mg daily.  Patient not taking: Reported on 12/24/2023   [provider]  Active Self  Multiple Vitamin (MULTIVITAMIN) capsule [id number]  Take 1 capsule by mouth daily. [provider]  Active Self  nitroGLYCERIN  (NITROSTAT ) 0.4 MG SL tablet [id number]  0.4 mg every 5 (five) minutes as needed for chest pain. [provider]  Active Self  Omega-3 Fatty Acids (FISH OIL) 1000 MG CAPS [id number]  Take by mouth 2 (two) times daily. [provider]  Active Self  pantoprazole  (PROTONIX ) 40 MG tablet [id number]  Take 1 tablet (40 mg total) by mouth 2 (two) times daily. Norleen Lynwood ORN, MD  Active   prasugrel  (EFFIENT ) 10 MG TABS tablet [id number]  Take 10 mg by mouth daily. [provider]  Active Self  tamsulosin  (FLOMAX ) 0.4 MG CAPS capsule [id number]  Take 1 capsule (0.4 mg total) by mouth daily. Norleen Lynwood ORN, MD  Active   triamcinolone  (KENALOG ) 0.025 % cream [id number]  Apply topically. [provider]  Active Self  VITAMIN D , CHOLECALCIFEROL, PO [id number]  Take by mouth daily. [provider]  Active Self           Home Care and Equipment/Supplies: Were Home Health Services Ordered?: Yes Name of Home Health Agency:: [facility] RN Has Agency set up a time to come to your home?: Yes First Home Health Visit Date: 02/06/24 Any new equipment or medical supplies ordered?: No (reports interested in getting a rollator walker; care coordination outreach placed with PCP to notify of same: patient declines TOC 30-day program enrollment)  Functional Questionnaire: Do you need assistance with bathing/showering or dressing?: No (daughter supervises/ assists as indicated) Do you need assistance with meal preparation?: No (daughter supervises/ assists as indicated) Do you need assistance with eating?: No Do you have difficulty maintaining continence:  No Do you need assistance with getting out of bed/getting out of a chair/moving?: No Do you have difficulty managing or taking your medications?: No (reports self-manages all medications except for the new IV antibiotic at home: reports daughter administering daily IV antibiotics: reports it is going very wll, she is doing a great job)  Follow up appointments reviewed: PCP Follow-up appointment confirmed?: Yes (care coordination outreach in real-time with scheduling care guide to successfully schedule hospital follow up PCP appointment 02/19/24: per patient request around appointment timing) Date of PCP follow-up appointment?: 02/19/24 Follow-up Provider: PCP- Dr. Norleen Specialist [facility] Follow-up appointment confirmed?: No Reason Specialist Follow-Up Not Confirmed: Patient has Specialist Provider Number and will Call for Appointment [facility] cardiology provider) Do you need transportation to your follow-up appointment?: No Do you understand care options if your condition(s) worsen?: Yes-patient verbalized understanding  SDOH Interventions Today    Flowsheet Row Most Recent Value  SDOH Interventions   Food Insecurity Interventions Intervention Not Indicated  Housing Interventions Intervention Not Indicated  Transportation Interventions Intervention Not Indicated  [drives self,  daughter assists as indicated]  Utilities Interventions Intervention Not Indicated   See TOC assessment tabs for additional assessment/ TOC intervention information Care Coordination outreach re: DME need: patient reports he would like to have a rollator walker states [facility] did not order one Provided education/ reinforcement around fall prevention  Provided education around role of home health services with importance of participation/ ongoing engagement Reinforced signs/ symptoms UTI/ PICC site infection- with need for ongoing regular assessments at home, along with corresponding action plan    Patient declines need for ongoing/ further care management/ coordination outreach; declines enrollment in 30-day TOC program- declines taking my direct phone number should needs/ concerns arise post-TOC call   Pls call/ message for questions,  Audrielle Vankuren Mckinney Embry Manrique, RN, BSN, CCRN Alumnus RN Care Manager  Transitions of Care  [facility] - [facility] [phone]: direct office

## 2024-02-08 NOTE — Telephone Encounter (Signed)
 Ok I have done the harcopy to CMA  Ok to ask pt to pickup , or if unable perhaps we can fax to his medical supply store    thanks

## 2024-02-08 NOTE — Addendum Note (Signed)
 Addended by: NORLEEN LYNWOOD ORN on: 02/08/2024 11:42 AM   Modules accepted: Orders

## 2024-02-08 NOTE — Telephone Encounter (Signed)
 The order has been faxed to [facility] today per patient request.

## 2024-02-16 ENCOUNTER — Other Ambulatory Visit: Payer: Self-pay

## 2024-02-16 ENCOUNTER — Other Ambulatory Visit: Payer: Self-pay | Admitting: Internal Medicine

## 2024-02-19 ENCOUNTER — Encounter: Payer: Self-pay | Admitting: Internal Medicine

## 2024-02-19 ENCOUNTER — Ambulatory Visit: Admitting: Internal Medicine

## 2024-02-19 ENCOUNTER — Ambulatory Visit: Payer: Self-pay | Admitting: Internal Medicine

## 2024-02-19 VITALS — BP 130/86 | HR 53 | Temp 97.9°F | Ht 70.0 in | Wt 210.0 lb

## 2024-02-19 DIAGNOSIS — E538 Deficiency of other specified B group vitamins: Secondary | ICD-10-CM

## 2024-02-19 DIAGNOSIS — I4891 Unspecified atrial fibrillation: Secondary | ICD-10-CM

## 2024-02-19 DIAGNOSIS — E559 Vitamin D deficiency, unspecified: Secondary | ICD-10-CM

## 2024-02-19 DIAGNOSIS — R269 Unspecified abnormalities of gait and mobility: Secondary | ICD-10-CM

## 2024-02-19 DIAGNOSIS — I2510 Atherosclerotic heart disease of native coronary artery without angina pectoris: Secondary | ICD-10-CM

## 2024-02-19 DIAGNOSIS — I255 Ischemic cardiomyopathy: Secondary | ICD-10-CM

## 2024-02-19 DIAGNOSIS — N1832 Chronic kidney disease, stage 3b: Secondary | ICD-10-CM

## 2024-02-19 DIAGNOSIS — E1140 Type 2 diabetes mellitus with diabetic neuropathy, unspecified: Secondary | ICD-10-CM

## 2024-02-19 LAB — BASIC METABOLIC PANEL WITH GFR
BUN: 25 mg/dL — ABNORMAL HIGH (ref 6–23)
CO2: 27 meq/L (ref 19–32)
Calcium: 9.8 mg/dL (ref 8.4–10.5)
Chloride: 105 meq/L (ref 96–112)
Creatinine, Ser: 1.82 mg/dL — ABNORMAL HIGH (ref 0.40–1.50)
GFR: 33.44 mL/min — ABNORMAL LOW (ref >60.00)
Glucose, Bld: 120 mg/dL — ABNORMAL HIGH (ref 70–99)
Potassium: 4.1 meq/L (ref 3.5–5.1)
Sodium: 140 meq/L (ref 135–145)

## 2024-02-19 LAB — URINALYSIS, ROUTINE W REFLEX MICROSCOPIC
Bilirubin Urine: NEGATIVE
Hgb urine dipstick: NEGATIVE
Ketones, ur: NEGATIVE
Nitrite: NEGATIVE
Specific Gravity, Urine: 1.01 (ref 1.000–1.030)
Urine Glucose: NEGATIVE
Urobilinogen, UA: 0.2 (ref 0.0–1.0)
pH: 6 (ref 5.0–8.0)

## 2024-02-19 LAB — CBC WITH DIFFERENTIAL/PLATELET
Basophils Absolute: 0.1 K/uL (ref 0.0–0.1)
Basophils Relative: 1.8 % (ref 0.0–3.0)
Eosinophils Absolute: 0.2 K/uL (ref 0.0–0.7)
Eosinophils Relative: 3.2 % (ref 0.0–5.0)
HCT: 36.6 % — ABNORMAL LOW (ref 39.0–52.0)
Hemoglobin: 12.3 g/dL — ABNORMAL LOW (ref 13.0–17.0)
Lymphocytes Relative: 20 % (ref 12.0–46.0)
Lymphs Abs: 1.4 K/uL (ref 0.7–4.0)
MCHC: 33.6 g/dL (ref 30.0–36.0)
MCV: 92.4 fl (ref 78.0–100.0)
Monocytes Absolute: 0.5 K/uL (ref 0.1–1.0)
Monocytes Relative: 7.1 % (ref 3.0–12.0)
Neutro Abs: 4.9 K/uL (ref 1.4–7.7)
Neutrophils Relative %: 67.9 % (ref 43.0–77.0)
Platelets: 445 K/uL — ABNORMAL HIGH (ref 150.0–400.0)
RBC: 3.96 Mil/uL — ABNORMAL LOW (ref 4.22–5.81)
RDW: 14 % (ref 11.5–15.5)
WBC: 7.2 K/uL (ref 4.0–10.5)

## 2024-02-19 LAB — LIPID PANEL
Cholesterol: 132 mg/dL (ref 0–200)
HDL: 40.4 mg/dL (ref >39.00)
LDL Cholesterol: 64 mg/dL (ref 0–99)
NonHDL: 91.68
Total CHOL/HDL Ratio: 3
Triglycerides: 140 mg/dL (ref 0.0–149.0)
VLDL: 28 mg/dL (ref 0.0–40.0)

## 2024-02-19 LAB — VITAMIN B12: Vitamin B-12: 1002 pg/mL — ABNORMAL HIGH (ref 211–911)

## 2024-02-19 LAB — HEPATIC FUNCTION PANEL
ALT: 17 U/L (ref 0–53)
AST: 21 U/L (ref 0–37)
Albumin: 3.9 g/dL (ref 3.5–5.2)
Alkaline Phosphatase: 71 U/L (ref 39–117)
Bilirubin, Direct: 0.1 mg/dL (ref 0.0–0.3)
Total Bilirubin: 0.8 mg/dL (ref 0.2–1.2)
Total Protein: 7.2 g/dL (ref 6.0–8.3)

## 2024-02-19 LAB — MICROALBUMIN / CREATININE URINE RATIO
Creatinine,U: 57.8 mg/dL
Microalb Creat Ratio: 64.5 mg/g — ABNORMAL HIGH (ref 0.0–30.0)
Microalb, Ur: 3.7 mg/dL — ABNORMAL HIGH (ref 0.0–1.9)

## 2024-02-19 LAB — VITAMIN D 25 HYDROXY (VIT D DEFICIENCY, FRACTURES): VITD: 41.66 ng/mL (ref 30.00–100.00)

## 2024-02-19 LAB — TSH: TSH: 1.98 u[IU]/mL (ref 0.35–5.50)

## 2024-02-19 LAB — HEMOGLOBIN A1C: Hgb A1c MFr Bld: 6.6 % — ABNORMAL HIGH (ref 4.6–6.5)

## 2024-02-19 NOTE — Assessment & Plan Note (Addendum)
 Without insulin , with neuropathy  Lab Results  Component Value Date   HGBA1C 6.6 (H) 02/19/2024   Stable, pt to continue current medical treatment  - diet, wt control

## 2024-02-19 NOTE — Patient Instructions (Signed)
 You had the flu shot today  Please continue all other medications as before, including the home IV antibiotic  Please have the pharmacy call with any other refills you may need.  Please continue your efforts at being more active, low cholesterol diet, and weight control.  Please keep your appointments with your specialists as you may have planned - ENT  You will be contacted regarding the referral for: home health with [facility] with RN and PT  Please go to the LAB at the blood drawing area for the tests to be done  You will be contacted by phone if any changes need to be made immediately.  Otherwise, you will receive a letter about your results with an explanation, but please check with MyChart first.  Please make an Appointment to return in 6 months, or sooner if needed

## 2024-02-19 NOTE — Progress Notes (Unsigned)
 Patient ID: Nathan Russo, male   DOB: [birth year], 83 y.o.   MRN: [id number]        Chief Complaint: follow up gait disorder, ischemic heart disease, thyroid  cancer, afib, chronic knee pain  with recent hospn dec 22- 28 2025 uti pyelonephritis and MRSA bacteremia       HPI:  Nathan Russo is a 83 y.o. male here with hx of gait disorder, ischemic heart disease, thyroid  cancer, afib, chronic knee pain  with recent hospn dec 22- 28 2025 uti pyelonephritis and MRSA bacteremia now with home IV antibiotic.  Pt denies chest pain, increased sob or doe, wheezing, orthopnea, PND, increased LE swelling, palpitations, dizziness or syncope.   Pt denies polydipsia, polyuria, or new focal neuro s/s.    Pt denies fever, wt loss, night sweats, loss of appetite, or other constitutional symptoms, in fact has gained several lbs.     Wife in snf after fall with right hip fx, hospd dec 2, may need to come home dec 17 and he is concerned about his stamina to take care of her.  Did have recent fall but no injury since been home.  Has Rollater to pickup and start using.  Saw ENT with no evidence for Head and neck ca, but has know prob stable lumg mets from thyroid  cancer.  For flu shot today Wt Readings from Last 3 Encounters:  02/19/24 210 lb (95.3 kg)  12/24/23 209 lb (94.8 kg)  11/06/23 201 lb (91.2 kg)   BP Readings from Last 3 Encounters:  02/19/24 130/86  12/24/23 108/62  11/06/23 134/82         Past Medical History:  Diagnosis Date   Abdominal pain, epigastric 04/11/2010   BELCHING 04/23/2010   BRADYCARDIA, CHRONIC 10/24/2006   CARPAL TUNNEL SYNDROME, BILATERAL 02/07/2009   CHEST PAIN-UNSPECIFIED 09/05/2008   CHOLELITHIASIS 04/22/2010   Cholelithiasis 06/13/2010   COLONIC POLYPS, HX OF 04/28/2007   DEGENERATIVE JOINT DISEASE, RIGHT KNEE 10/24/2006   Depression 02/23/2011   DIABETES MELLITUS, TYPE II 04/28/2007   Dizziness and giddiness 12/19/2009   Elevated PSA 06/13/2010   FATIGUE 04/28/2007   GERD 02/11/2009    Headache(784.0) 12/19/2009   HYPERLIPIDEMIA 04/28/2007   HYPERTENSION 10/21/2006   NECK MASS 02/07/2010   OBESITY 10/24/2006   OTITIS MEDIA, ACUTE, LEFT 12/26/2009   PHIMOSIS 02/07/2009   S/P laparoscopic cholecystectomy 10/31/2010   Thyroid  cancer (HCC) 06/13/2010   THYROID  NODULE 02/07/2010   Past Surgical History:  Procedure Laterality Date   CHOLECYSTECTOMY     left knee surgery     right wrist surgury     THYROID  SURGERY      reports that he has quit smoking. He has never used smokeless tobacco. He reports that he does not drink alcohol and does not use drugs. family history includes Arthritis in his mother; Breast cancer in his sister; Colon cancer (age of onset: 42) in his brother; Diabetes in his brother; Goiter in his mother; Heart attack (age of onset: 62) in his brother; Heart attack (age of onset: 82) in his father; Hypertension in his mother; Hypothyroidism in his sister. Allergies[1] Medications Ordered Prior to Encounter[2]      ROS:  All others reviewed and negative.  Objective        PE:  BP 130/86 (BP Location: Right Arm, Patient Position: Sitting, Cuff Size: Normal)   Pulse (!) 53   Temp 97.9 F (36.6 C) (Oral)   Ht 5' 10 (1.778 m)   Wt 210  lb (95.3 kg)   SpO2 99%   BMI 30.13 kg/m                 Constitutional: Pt appears in NAD               HENT: Head: NCAT.                Right Ear: External ear normal.                 Left Ear: External ear normal.                Eyes: . Pupils are equal, round, and reactive to light. Conjunctivae and EOM are normal               Nose: without d/c or deformity               Neck: Neck supple. Gross normal ROM               Cardiovascular: Normal rate and regular rhythm.                 Pulmonary/Chest: Effort normal and breath sounds without rales or wheezing.                Abd:  Soft, NT, ND, + BS, no organomegaly               Neurological: Pt is alert. At baseline orientation, motor grossly intact               Skin:  Skin is warm. No rashes, no other new lesions, LE edema - none               Psychiatric: Pt behavior is normal without agitation   Micro: none  Cardiac tracings I have personally interpreted today:  none  Pertinent Radiological findings (summarize): none   Lab Results  Component Value Date   WBC 7.2 02/19/2024   HGB 12.3 (L) 02/19/2024   HCT 36.6 (L) 02/19/2024   PLT 445.0 (H) 02/19/2024   GLUCOSE 120 (H) 02/19/2024   CHOL 132 02/19/2024   TRIG 140.0 02/19/2024   HDL 40.40 02/19/2024   LDLDIRECT 80.0 03/18/2022   LDLCALC 64 02/19/2024   ALT 17 02/19/2024   AST 21 02/19/2024   NA 140 02/19/2024   K 4.1 02/19/2024   CL 105 02/19/2024   CREATININE 1.82 (H) 02/19/2024   BUN 25 (H) 02/19/2024   CO2 27 02/19/2024   TSH 1.98 02/19/2024   PSA 3.59 05/08/2017   INR 1.0 08/22/2008   HGBA1C 6.6 (H) 02/19/2024   MICROALBUR 3.7 (H) 02/19/2024   Assessment/Plan:  Nathan Russo is a 83 y.o. White or Caucasian [1] male with  has a past medical history of Abdominal pain, epigastric (04/11/2010), BELCHING (04/23/2010), BRADYCARDIA, CHRONIC (10/24/2006), CARPAL TUNNEL SYNDROME, BILATERAL (02/07/2009), CHEST PAIN-UNSPECIFIED (09/05/2008), CHOLELITHIASIS (04/22/2010), Cholelithiasis (06/13/2010), COLONIC POLYPS, HX OF (04/28/2007), DEGENERATIVE JOINT DISEASE, RIGHT KNEE (10/24/2006), Depression (02/23/2011), DIABETES MELLITUS, TYPE II (04/28/2007), Dizziness and giddiness (12/19/2009), Elevated PSA (06/13/2010), FATIGUE (04/28/2007), GERD (02/11/2009), Headache(784.0) (12/19/2009), HYPERLIPIDEMIA (04/28/2007), HYPERTENSION (10/21/2006), NECK MASS (02/07/2010), OBESITY (10/24/2006), OTITIS MEDIA, ACUTE, LEFT (12/26/2009), PHIMOSIS (02/07/2009), S/P laparoscopic cholecystectomy (10/31/2010), Thyroid  cancer (HCC) (06/13/2010), and THYROID  NODULE (02/07/2010).  Diabetes mellitus (HCC) Without insulin , with neuropathy  Lab Results  Component Value Date   HGBA1C 6.6 (H) 02/19/2024   Stable, pt to continue current medical  treatment  - diet, wt control   Ischemic dilated cardiomyopathy (  HCC) Stable volume, cont current med tx  Atrial fibrillation and flutter (HCC) Stable rate and volume, cont eliquis   CKD stage 3b, GFR 30-44 ml/min (HCC) Lab Results  Component Value Date   CREATININE 1.82 (H) 02/19/2024   Stable overall, cont to avoid nephrotoxins  Gait disorder Worsening s/p recent hospn, Pt also for [facility] with RN, PT with [facility]  Followup: Return in about 6 months (around 08/19/2024).  Lynwood Rush, MD 02/21/2024 4:51 PM [facility]     [1]  Allergies Allergen Reactions   Diphenoxylate -Atropine  Other (See Comments)   Other Other (See Comments) and Hives    Causes body to ache Causes body to ache   Sulfa Antibiotics Other (See Comments)    As child almost died   Ace Inhibitors     REACTION: cough   Atenolol     REACTION: bradycardia   Codeine Other (See Comments)    Head spins wild   Lovastatin     REACTION: myalygros   Morphine Nausea And Vomiting   Morphine And Codeine Other (See Comments)   Nsaids     Heart patient   Statins Other (See Comments)    Causes body to ache   Sulfamethoxazole Other (See Comments)    Childhood unknown reaction   Sulfasalazine Other (See Comments)    As child almost died   Tramadol      Ants crawling all over   Ibuprofen Other (See Comments)    Takes blood thinners  ibuprofen  [2]  Current Outpatient Medications on File Prior to Visit  Medication Sig Dispense Refill   amiodarone  (PACERONE ) 200 MG tablet Take by mouth.     amLODipine  (NORVASC ) 2.5 MG tablet Take 1 tablet by mouth once daily 90 tablet 3   amoxicillin -clavulanate (AUGMENTIN ) 875-125 MG tablet Take 1 tablet by mouth 2 (two) times daily. 20 tablet 0   apixaban  (ELIQUIS ) 5 MG TABS tablet Take 2.5 mg by mouth 2 (two) times daily.     Ascorbic Acid (VITAMIN C) 1000 MG tablet Take 1,000 mg by mouth daily.      augmented betamethasone  dipropionate (DIPROLENE -AF) 0.05 % ointment betamethasone , augmented 0.05 % topical ointment  APPLY OINTMENT TOPICALLY TO EACH EAR AS NEEDED FOR ITCHING     Blood Glucose Calibration (ACCU-CHEK GUIDE CONTROL) LIQD Use as directed once daily as needed 1 each 3   Blood Glucose Monitoring Suppl (ACCU-CHEK GUIDE ME) w/Device KIT Use as directed once daily E11.9 1 kit 1   Calcium  Carb-Cholecalciferol [phone] MG-UNIT CAPS Take by mouth.     ciclopirox (PENLAC) 8 % solution      Cinnamon 500 MG capsule Take 500 mg by mouth 2 (two) times daily.     Continuous Glucose Receiver (DEXCOM G7 RECEIVER) DEVI Use as directed once daily E11.9 1 each 0   Continuous Glucose Sensor (DEXCOM G7 SENSOR) MISC Use as directed once every 10 days E11.9 9 each 3   cyanocobalamin  1000 MCG tablet Take 1,000 mcg by mouth daily.     ezetimibe  (ZETIA ) 10 MG tablet Take 1 tablet by mouth once daily 90 tablet 0   FERREX 150 150 MG capsule Take 1 capsule by mouth once daily 90 capsule 0   Flaxseed, Linseed, 1000 MG CAPS Take 1 capsule by mouth daily.     Garlic Oil 500 MG TABS Take by mouth 2 (two) times daily.     Ginger, Zingiber officinalis, (GINGER PO) Take 1 tablet by  mouth in the morning and at bedtime.     Glucosamine-Chondroit-Vit C-Mn (GLUCOSAMINE CHONDROITIN COMPLX) CAPS Take by mouth daily.     Glucosamine-Chondroitin 250-200 MG TABS Take 1 tablet by mouth at bedtime.     glucose blood (ACCU-CHEK GUIDE TEST) test strip Use as instructed 4 times every day E11.9 400 each 12   hydrocortisone 2.5 % ointment Apply 1 Application topically 2 (two) times daily.     isosorbide  mononitrate (IMDUR ) 60 MG 24 hr tablet Take by mouth.     Lancets MISC Use as directed three times per day E11.9 300 each 3   levothyroxine  (SYNTHROID ) 125 MCG tablet Take 1 tablet (125 mcg total) by mouth daily. 90 tablet 3   losartan  (COZAAR ) 100 MG tablet Take 1 tablet (100 mg total) by mouth daily. 90 tablet 3    methylPREDNISolone  (MEDROL  DOSEPAK) 4 MG TBPK tablet 4 tab by mouth once daily x 3 days, 2 tabs x 3 days, 1 tab x 3 days 21 tablet 0   metoprolol  succinate (TOPROL -XL) 25 MG 24 hr tablet 12.5 mg daily. (Patient not taking: Reported on 12/24/2023)     Multiple Vitamin (MULTIVITAMIN) capsule Take 1 capsule by mouth daily.     nitroGLYCERIN  (NITROSTAT ) 0.4 MG SL tablet 0.4 mg every 5 (five) minutes as needed for chest pain.     Omega-3 Fatty Acids (FISH OIL) 1000 MG CAPS Take by mouth 2 (two) times daily.     pantoprazole  (PROTONIX ) 40 MG tablet Take 1 tablet (40 mg total) by mouth 2 (two) times daily. 180 tablet 2   prasugrel  (EFFIENT ) 10 MG TABS tablet Take 10 mg by mouth daily.     tamsulosin  (FLOMAX ) 0.4 MG CAPS capsule Take 1 capsule (0.4 mg total) by mouth daily. 90 capsule 3   triamcinolone  (KENALOG ) 0.025 % cream Apply topically.     VITAMIN D , CHOLECALCIFEROL, PO Take by mouth daily.     No current facility-administered medications on file prior to visit.

## 2024-02-19 NOTE — Progress Notes (Signed)
 The test results show that your current treatment is OK, as the tests are stable.  Please continue the same plan.  There is no other need for change of treatment or further evaluation based on these results, at this time.  thanks

## 2024-02-21 ENCOUNTER — Encounter: Payer: Self-pay | Admitting: Internal Medicine

## 2024-02-21 NOTE — Assessment & Plan Note (Signed)
 Stable rate and volume, cont eliquis 

## 2024-02-21 NOTE — Assessment & Plan Note (Signed)
Stable volume, cont current med tx 

## 2024-02-21 NOTE — Assessment & Plan Note (Signed)
 Worsening s/p recent hospn, Pt also for [facility] with RN, PT with adoration

## 2024-02-21 NOTE — Assessment & Plan Note (Signed)
 Lab Results  Component Value Date   CREATININE 1.82 (H) 02/19/2024   Stable overall, cont to avoid nephrotoxins

## 2024-03-11 ENCOUNTER — Ambulatory Visit

## 2024-03-11 DIAGNOSIS — Z23 Encounter for immunization: Secondary | ICD-10-CM | POA: Diagnosis not present

## 2024-03-11 NOTE — Progress Notes (Signed)
 Pt was given High dose Flu vaccine with no complications.

## 2024-03-16 ENCOUNTER — Other Ambulatory Visit: Payer: Self-pay | Admitting: Internal Medicine

## 2024-03-17 ENCOUNTER — Other Ambulatory Visit: Payer: Self-pay

## 2024-03-29 ENCOUNTER — Ambulatory Visit: Payer: Medicare Other

## 2024-04-01 ENCOUNTER — Ambulatory Visit

## 2024-04-01 VITALS — Ht 71.0 in | Wt 201.0 lb

## 2024-04-01 DIAGNOSIS — Z0000 Encounter for general adult medical examination without abnormal findings: Secondary | ICD-10-CM

## 2024-04-01 NOTE — Progress Notes (Signed)
 "  Chief Complaint  Patient presents with   Medicare Wellness     Subjective:   Nathan Russo is a 84 y.o. male who presents for a Medicare Annual Wellness Visit.  Visit info / Clinical Intake: Medicare Wellness Visit Type:: Subsequent Annual Wellness Visit Persons participating in visit and providing information:: patient Medicare Wellness Visit Mode:: Telephone If telephone:: video declined Since this visit was completed virtually, some vitals may be partially provided or unavailable. Missing vitals are due to the limitations of the virtual format.: Documented vitals are patient reported If Telephone or Video please confirm:: I connected with patient using audio/video enable telemedicine. I verified patient identity with two identifiers, discussed telehealth limitations, and patient agreed to proceed. Patient Location:: home Provider Location:: office Interpreter Needed?: No Pre-visit prep was completed: yes AWV questionnaire completed by patient prior to visit?: no Living arrangements:: lives with spouse/significant other Patient's Overall Health Status Rating: good Typical amount of pain: (!) a lot Does pain affect daily life?: (!) yes Are you currently prescribed opioids?: no  Dietary Habits and Nutritional Risks How many meals a day?: (!) 1 Eats fruit and vegetables daily?: yes Most meals are obtained by: preparing own meals; eating out In the last 2 weeks, have you had any of the following?: none Diabetic:: (!) yes How often do you check your BS?: 4 Would you like to be referred to a Nutritionist or for Diabetic Management? : no  Functional Status Activities of Daily Living (to include ambulation/medication): Independent Ambulation: Independent with device- listed below Home Assistive Devices/Equipment: Cane Medication Administration: Independent Home Management (perform basic housework or laundry): Independent Manage your own finances?: yes Primary transportation  is: driving Concerns about vision?: (!) yes (due to being diabetic) Concerns about hearing?: (!) yes Uses hearing aids?: (!) yes  Fall Screening Falls in the past year?: 0 Number of falls in past year: 0 Was there an injury with Fall?: 0 Fall Risk Category Calculator: 0 Patient Fall Risk Level: Low Fall Risk  Fall Risk Patient at Risk for Falls Due to: Medication side effect; Impaired mobility Fall risk Follow up: Falls prevention discussed; Education provided; Falls evaluation completed  Home and Transportation Safety: All rugs have non-skid backing?: yes All stairs or steps have railings?: yes (stair lift) Grab bars in the bathtub or shower?: yes Have non-skid surface in bathtub or shower?: yes Good home lighting?: yes Regular seat belt use?: yes Hospital stays in the last year:: (!) yes How many hospital stays:: 2 Reason: MRSA and heart  Cognitive Assessment Difficulty concentrating, remembering, or making decisions? : no Will 6CIT or Mini Cog be Completed: yes What year is it?: 0 points What month is it?: 0 points Give patient an address phrase to remember (5 components): [address] [place] About what time is it?: 0 points Count backwards from 20 to 1: 0 points Say the months of the year in reverse: 0 points Repeat the address phrase from earlier: 0 points 6 CIT Score: 0 points  Advance Directives (For Healthcare) Does Patient Have a Medical Advance Directive?: Yes Does patient want to make changes to medical advance directive?: No - Patient declined Type of Advance Directive: Healthcare Power of [place]; Living will Copy of Healthcare Power of Attorney in Chart?: No - copy requested Copy of Living Will in Chart?: No - copy requested  Reviewed/Updated  Reviewed/Updated: Reviewed All (Medical, Surgical, Family, Medications, Allergies, Care Teams, Patient Goals)    Allergies (verified) Diphenoxylate -atropine , Other, Sulfa antibiotics, Ace  inhibitors,  Atenolol, Codeine, Lovastatin, Morphine, Morphine and codeine, Nsaids, Statins, Sulfamethoxazole, Sulfasalazine, Tramadol , and Ibuprofen   Current Medications (verified) Outpatient Encounter Medications as of 04/01/2024  Medication Sig   amiodarone  (PACERONE ) 200 MG tablet Take by mouth.   amLODipine  (NORVASC ) 2.5 MG tablet Take 1 tablet by mouth once daily   apixaban  (ELIQUIS ) 5 MG TABS tablet Take 2.5 mg by mouth 2 (two) times daily.   Ascorbic Acid (VITAMIN C) 1000 MG tablet Take 1,000 mg by mouth daily.   augmented betamethasone  dipropionate (DIPROLENE -AF) 0.05 % ointment betamethasone , augmented 0.05 % topical ointment  APPLY OINTMENT TOPICALLY TO EACH EAR AS NEEDED FOR ITCHING   Blood Glucose Calibration (ACCU-CHEK GUIDE CONTROL) LIQD Use as directed once daily as needed   Blood Glucose Monitoring Suppl (ACCU-CHEK GUIDE ME) w/Device KIT Use as directed once daily E11.9   Calcium  Carb-Cholecalciferol [phone] MG-UNIT CAPS Take by mouth.   ciclopirox (PENLAC) 8 % solution    Cinnamon 500 MG capsule Take 500 mg by mouth 2 (two) times daily.   cyanocobalamin  1000 MCG tablet Take 1,000 mcg by mouth daily.   ezetimibe  (ZETIA ) 10 MG tablet Take 1 tablet by mouth once daily   FERREX 150 150 MG capsule Take 1 capsule by mouth once daily   Flaxseed, Linseed, 1000 MG CAPS Take 1 capsule by mouth daily.   Garlic Oil 500 MG TABS Take by mouth 2 (two) times daily.   Ginger, Zingiber officinalis, (GINGER PO) Take 1 tablet by mouth in the morning and at bedtime.   Glucosamine-Chondroit-Vit C-Mn (GLUCOSAMINE CHONDROITIN COMPLX) CAPS Take by mouth daily.   Glucosamine-Chondroitin 250-200 MG TABS Take 1 tablet by mouth at bedtime.   glucose blood (ACCU-CHEK GUIDE TEST) test strip Use as instructed 4 times every day E11.9   hydrocortisone 2.5 % ointment Apply 1 Application topically 2 (two) times daily.   isosorbide  mononitrate (IMDUR ) 60 MG 24 hr tablet Take by mouth.   Lancets MISC Use as directed  three times per day E11.9   levothyroxine  (SYNTHROID ) 112 MCG tablet Take 112 mcg by mouth every morning.   levothyroxine  (SYNTHROID ) 25 MCG tablet TAKE 1 TABLET BY MOUTH THREE TIMES A WEEK ON MONDAY, WEDNESDAY, AND FRIDAY IN ADDITION TO YOUR REGULAR DOSE.   losartan  (COZAAR ) 100 MG tablet Take 1 tablet (100 mg total) by mouth daily.   Multiple Vitamin (MULTIVITAMIN) capsule Take 1 capsule by mouth daily.   nitroGLYCERIN  (NITROSTAT ) 0.4 MG SL tablet 0.4 mg every 5 (five) minutes as needed for chest pain.   Omega-3 Fatty Acids (FISH OIL) 1000 MG CAPS Take by mouth 2 (two) times daily.   pantoprazole  (PROTONIX ) 40 MG tablet Take 1 tablet (40 mg total) by mouth 2 (two) times daily.   prasugrel  (EFFIENT ) 10 MG TABS tablet Take 10 mg by mouth daily.   tamsulosin  (FLOMAX ) 0.4 MG CAPS capsule Take 1 capsule (0.4 mg total) by mouth daily.   triamcinolone  (KENALOG ) 0.025 % cream Apply topically.   VITAMIN D , CHOLECALCIFEROL, PO Take by mouth daily.   amoxicillin -clavulanate (AUGMENTIN ) 875-125 MG tablet Take 1 tablet by mouth 2 (two) times daily. (Patient not taking: Reported on 04/01/2024)   Continuous Glucose Receiver (DEXCOM G7 RECEIVER) DEVI Use as directed once daily E11.9 (Patient not taking: Reported on 04/01/2024)   Continuous Glucose Sensor (DEXCOM G7 SENSOR) MISC Use as directed once every 10 days E11.9 (Patient not taking: Reported on 04/01/2024)   levothyroxine  (SYNTHROID ) 125 MCG tablet Take 1 tablet (125 mcg total) by mouth  daily. (Patient not taking: Reported on 04/01/2024)   methylPREDNISolone  (MEDROL  DOSEPAK) 4 MG TBPK tablet 4 tab by mouth once daily x 3 days, 2 tabs x 3 days, 1 tab x 3 days (Patient not taking: Reported on 04/01/2024)   metoprolol  succinate (TOPROL -XL) 25 MG 24 hr tablet 12.5 mg daily. (Patient not taking: Reported on 04/01/2024)   No facility-administered encounter medications on file as of 04/01/2024.    History: Past Medical History:  Diagnosis Date   Abdominal pain,  epigastric 04/11/2010   BELCHING 04/23/2010   BRADYCARDIA, CHRONIC 10/24/2006   CARPAL TUNNEL SYNDROME, BILATERAL 02/07/2009   CHEST PAIN-UNSPECIFIED 09/05/2008   CHOLELITHIASIS 04/22/2010   Cholelithiasis 06/13/2010   COLONIC POLYPS, HX OF 04/28/2007   DEGENERATIVE JOINT DISEASE, RIGHT KNEE 10/24/2006   Depression 02/23/2011   DIABETES MELLITUS, TYPE II 04/28/2007   Dizziness and giddiness 12/19/2009   Elevated PSA 06/13/2010   FATIGUE 04/28/2007   GERD 02/11/2009   Headache(784.0) 12/19/2009   HYPERLIPIDEMIA 04/28/2007   HYPERTENSION 10/21/2006   NECK MASS 02/07/2010   OBESITY 10/24/2006   OTITIS MEDIA, ACUTE, LEFT 12/26/2009   PHIMOSIS 02/07/2009   S/P laparoscopic cholecystectomy 10/31/2010   Thyroid  cancer (HCC) 06/13/2010   THYROID  NODULE 02/07/2010   Past Surgical History:  Procedure Laterality Date   CHOLECYSTECTOMY     left knee surgery     right wrist surgury     THYROID  SURGERY     Family History  Problem Relation Age of Onset   Hypertension Mother    Arthritis Mother    Goiter Mother    Heart attack Father 74   Hypothyroidism Sister    Breast cancer Sister    Heart attack Brother 78   Diabetes Brother    Colon cancer Brother 55   Social History   Occupational History   Occupation: former Doctor, General Practice: RETIRED  Tobacco Use   Smoking status: Former   Smokeless tobacco: Never  Advertising Account Planner   Vaping status: Never Used  Substance and Sexual Activity   Alcohol use: No    Alcohol/week: 0.0 standard drinks of alcohol   Drug use: No   Sexual activity: Not Currently   Tobacco Counseling Counseling given: Not Answered  SDOH Screenings   Food Insecurity: No Food Insecurity (04/01/2024)  Housing: Unknown (04/01/2024)  Transportation Needs: No Transportation Needs (04/01/2024)  Utilities: Not At Risk (04/01/2024)  Alcohol Screen: Low Risk (04/01/2024)  Depression (PHQ2-9): Low Risk (04/01/2024)  Financial Resource Strain: Low Risk  (04/01/2024)  Physical Activity: Insufficiently Active (04/01/2024)  Social Connections: Moderately Integrated (04/01/2024)  Stress: No Stress Concern Present (04/01/2024)  Tobacco Use: Medium Risk (04/01/2024)  Health Literacy: Adequate Health Literacy (04/01/2024)   See flowsheets for full screening details  Depression Screen PHQ 2 & 9 Depression Scale- Over the past 2 weeks, how often have you been bothered by any of the following problems? Little interest or pleasure in doing things: 0 Feeling down, depressed, or hopeless (PHQ Adolescent also includes...irritable): 0 PHQ-2 Total Score: 0 Trouble falling or staying asleep, or sleeping too much: 0 Feeling tired or having little energy: 0 Poor appetite or overeating (PHQ Adolescent also includes...weight loss): 0 Feeling bad about yourself - or that you are a failure or have let yourself or your family down: 0 Trouble concentrating on things, such as reading the newspaper or watching television (PHQ Adolescent also includes...like school work): 0 Moving or speaking so slowly that other people could have noticed. Or the  opposite - being so fidgety or restless that you have been moving around a lot more than usual: 0 Thoughts that you would be better off dead, or of hurting yourself in some way: 0 PHQ-9 Total Score: 0 If you checked off any problems, how difficult have these problems made it for you to do your work, take care of things at home, or get along with other people?: Not difficult at all     Goals Addressed               This Visit's Progress     wants to live to 89 (pt-stated)               Objective:    Today's Vitals   04/01/24 1321  Weight: 201 lb (91.2 kg)  Height: 5' 11 (1.803 m)   Body mass index is 28.03 kg/m.  Hearing/Vision screen Vision Screening - Comments:: Regular eye exams Immunizations and Health Maintenance Health Maintenance  Topic Date Due   Zoster Vaccines- Shingrix (1 of 2) Never done    COVID-19 Vaccine (3 - Pfizer risk series) 06/28/2019   OPHTHALMOLOGY EXAM  02/23/2024   FOOT EXAM  04/01/2024   Diabetic kidney evaluation - Urine ACR  08/19/2024   Diabetic kidney evaluation - eGFR measurement  02/18/2025   Medicare Annual Wellness (AWV)  04/01/2025   DTaP/Tdap/Td (4 - Td or Tdap) 02/06/2030   Pneumococcal Vaccine: 50+ Years  Completed   Influenza Vaccine  Completed   Meningococcal B Vaccine  Aged Out        Assessment/Plan:  This is a routine wellness examination for Tramell.  Patient Care Team: Norleen Lynwood ORN, MD as PCP - General Graig Lynwood BIRCH, MD as Referring Physician (Plastic Surgery) Epifanio Alm HERO, MD as Referring Physician (Cardiology) Tobie Tonita POUR, DO as Consulting Physician (Neurology) [facility], O.D., PA Maralee Elspeth BROCKS., MD (Cardiology)  I have personally reviewed and noted the following in the patients chart:   Medical and social history Use of alcohol, tobacco or illicit drugs  Current medications and supplements including opioid prescriptions. Functional ability and status Nutritional status Physical activity Advanced directives List of other physicians Hospitalizations, surgeries, and ER visits in previous 12 months Vitals Screenings to include cognitive, depression, and falls Referrals and appointments  No orders of the defined types were placed in this encounter.  In addition, I have reviewed and discussed with patient certain preventive protocols, quality metrics, and best practice recommendations. A written personalized care plan for preventive services as well as general preventive health recommendations were provided to patient.   Ardella FORBES Dawn, LPN   [id number]/76/[id number]   Return in 1 year (on 04/01/2025).  After Visit Summary: (MyChart) Due to this being a telephonic visit, the after visit summary with patients personalized plan was offered to patient via MyChart   Nurse Notes: HM Addressed: Vaccines Due: covid and  shingles Diabetic Foot Exam recommended States he is making an eye appointment soon  "

## 2024-04-01 NOTE — Patient Instructions (Signed)
 [name],  Thank you for taking the time for your Medicare Wellness Visit. I appreciate your continued commitment to your health goals. Please review the care plan we discussed, and feel free to reach out if I can assist you further.  Please note that Annual Wellness Visits do not include a physical exam. Some assessments may be limited, especially if the visit was conducted virtually. If needed, we may recommend an in-person follow-up with your provider.  Ongoing Care Seeing your primary care provider every 3 to 6 months helps us  monitor your health and provide consistent, personalized care.   Referrals If a referral was made during today's visit and you haven't received any updates within two weeks, please contact the referred provider directly to check on the status.  Recommended Screenings:  Health Maintenance  Topic Date Due   Zoster (Shingles) Vaccine (1 of 2) Never done   COVID-19 Vaccine (3 - Pfizer risk series) 06/28/2019   Eye exam for diabetics  02/23/2024   Medicare Annual Wellness Visit  03/26/2024   Complete foot exam   04/01/2024   Kidney health urinalysis for diabetes  08/19/2024   Yearly kidney function blood test for diabetes  02/18/2025   DTaP/Tdap/Td vaccine (4 - Td or Tdap) 02/06/2030   Pneumococcal Vaccine for age over 17  Completed   Flu Shot  Completed   Meningitis B Vaccine  Aged Out       04/01/2024    1:31 PM  Advanced Directives  Does Patient Have a Medical Advance Directive? Yes  Type of Estate Agent of [place];Living will  Does patient want to make changes to medical advance directive? No - Patient declined  Copy of Healthcare Power of Attorney in Chart? No - copy requested    Vision: Annual vision screenings are recommended for early detection of glaucoma, cataracts, and diabetic retinopathy. These exams can also reveal signs of chronic conditions such as diabetes and high blood pressure.  Dental: Annual dental screenings  help detect early signs of oral cancer, gum disease, and other conditions linked to overall health, including heart disease and diabetes.  Please see the attached documents for additional preventive care recommendations.

## 2024-04-05 ENCOUNTER — Telehealth: Payer: Self-pay

## 2024-04-05 NOTE — Telephone Encounter (Signed)
 Good afternoon!  I received a call from [facility] regarding patient's appointment for tomorrow. They are wondering if there would be anyway that you could see him at the same time as his spouse?  His appointment is currently set for 1:00 and his wife is being seen at 11:00. He has another appointment later in [place] and he just wants to make sure that he can make it in time.

## 2024-04-05 NOTE — Telephone Encounter (Signed)
 Ok sure, if they both are here at 11 am, this should work out fine.   Thanks!

## 2024-04-06 ENCOUNTER — Encounter: Payer: Self-pay | Admitting: Internal Medicine

## 2024-04-06 ENCOUNTER — Ambulatory Visit: Admitting: Internal Medicine

## 2024-04-06 VITALS — BP 120/64 | HR 59 | Temp 97.8°F | Ht 71.0 in | Wt 202.0 lb

## 2024-04-06 DIAGNOSIS — E1140 Type 2 diabetes mellitus with diabetic neuropathy, unspecified: Secondary | ICD-10-CM

## 2024-04-06 DIAGNOSIS — D508 Other iron deficiency anemias: Secondary | ICD-10-CM | POA: Diagnosis not present

## 2024-04-06 DIAGNOSIS — I10 Essential (primary) hypertension: Secondary | ICD-10-CM

## 2024-04-06 DIAGNOSIS — E7800 Pure hypercholesterolemia, unspecified: Secondary | ICD-10-CM

## 2024-04-06 DIAGNOSIS — Z0000 Encounter for general adult medical examination without abnormal findings: Secondary | ICD-10-CM

## 2024-04-06 LAB — URINALYSIS, ROUTINE W REFLEX MICROSCOPIC
Bilirubin Urine: NEGATIVE
Hgb urine dipstick: NEGATIVE
Leukocytes,Ua: NEGATIVE
Nitrite: NEGATIVE
RBC / HPF: NONE SEEN
Specific Gravity, Urine: 1.02 (ref 1.000–1.030)
Total Protein, Urine: NEGATIVE
Urine Glucose: NEGATIVE
Urobilinogen, UA: 0.2 (ref 0.0–1.0)
pH: 6 (ref 5.0–8.0)

## 2024-04-06 LAB — CBC WITH DIFFERENTIAL/PLATELET
Basophils Absolute: 0.1 10*3/uL (ref 0.0–0.1)
Basophils Relative: 0.8 % (ref 0.0–3.0)
Eosinophils Absolute: 0.1 10*3/uL (ref 0.0–0.7)
Eosinophils Relative: 1.4 % (ref 0.0–5.0)
HCT: 39.9 % (ref 39.0–52.0)
Hemoglobin: 13.3 g/dL (ref 13.0–17.0)
Lymphocytes Relative: 17.7 % (ref 12.0–46.0)
Lymphs Abs: 1.6 10*3/uL (ref 0.7–4.0)
MCHC: 33.3 g/dL (ref 30.0–36.0)
MCV: 93.5 fl (ref 78.0–100.0)
Monocytes Absolute: 0.5 10*3/uL (ref 0.1–1.0)
Monocytes Relative: 6.1 % (ref 3.0–12.0)
Neutro Abs: 6.7 10*3/uL (ref 1.4–7.7)
Neutrophils Relative %: 74 % (ref 43.0–77.0)
Platelets: 298 10*3/uL (ref 150.0–400.0)
RBC: 4.26 Mil/uL (ref 4.22–5.81)
RDW: 15.5 % (ref 11.5–15.5)
WBC: 9.1 10*3/uL (ref 4.0–10.5)

## 2024-04-06 LAB — MICROALBUMIN / CREATININE URINE RATIO
Creatinine,U: 144.6 mg/dL
Microalb Creat Ratio: 6.1 mg/g (ref 0.0–30.0)
Microalb, Ur: 0.9 mg/dL (ref 0.7–1.9)

## 2024-04-06 LAB — IBC PANEL
Iron: 97 ug/dL (ref 42–165)
Saturation Ratios: 31.1 % (ref 20.0–50.0)
TIBC: 312.2 ug/dL (ref 250.0–450.0)
Transferrin: 223 mg/dL (ref 212.0–360.0)

## 2024-04-06 LAB — BASIC METABOLIC PANEL WITH GFR
BUN: 32 mg/dL — ABNORMAL HIGH (ref 6–23)
CO2: 26 meq/L (ref 19–32)
Calcium: 9.5 mg/dL (ref 8.4–10.5)
Chloride: 107 meq/L (ref 96–112)
Creatinine, Ser: 1.82 mg/dL — ABNORMAL HIGH (ref 0.40–1.50)
GFR: 33.41 mL/min — ABNORMAL LOW
Glucose, Bld: 120 mg/dL — ABNORMAL HIGH (ref 70–99)
Potassium: 4.2 meq/L (ref 3.5–5.1)
Sodium: 138 meq/L (ref 135–145)

## 2024-04-06 LAB — FERRITIN: Ferritin: 30.9 ng/mL (ref 22.0–322.0)

## 2024-04-06 LAB — HEPATIC FUNCTION PANEL
ALT: 17 U/L (ref 3–53)
AST: 20 U/L (ref 5–37)
Albumin: 3.9 g/dL (ref 3.5–5.2)
Alkaline Phosphatase: 68 U/L (ref 39–117)
Bilirubin, Direct: 0.2 mg/dL (ref 0.1–0.3)
Total Bilirubin: 0.8 mg/dL (ref 0.2–1.2)
Total Protein: 6.9 g/dL (ref 6.0–8.3)

## 2024-04-06 LAB — TSH: TSH: 3.6 u[IU]/mL (ref 0.35–5.50)

## 2024-04-06 LAB — LIPID PANEL
Cholesterol: 124 mg/dL (ref 28–200)
HDL: 42.3 mg/dL
LDL Cholesterol: 57 mg/dL (ref 10–99)
NonHDL: 81.87
Total CHOL/HDL Ratio: 3
Triglycerides: 125 mg/dL (ref 10.0–149.0)
VLDL: 25 mg/dL (ref 0.0–40.0)

## 2024-04-06 LAB — HEMOGLOBIN A1C: Hgb A1c MFr Bld: 6.7 % — ABNORMAL HIGH (ref 4.6–6.5)

## 2024-04-06 NOTE — Assessment & Plan Note (Signed)
No overt bleeding, for f/u lab today 

## 2024-04-06 NOTE — Progress Notes (Signed)
 Patient ID: Nathan Russo, male   DOB: May 16, 1940, 84 y.o.   MRN: [id number]         Chief Complaint:: wellness exam and dm with neuropathy, htn, hld       HPI:  Nathan Russo is a 84 y.o. male here for wellness exam; missed recent appt with optho but plans to reschedule soon, for shingrix at pharmacy, o/w up to date                        Also Pt denies chest pain, increased sob or doe, wheezing, orthopnea, PND, increased LE swelling, palpitations, dizziness or syncope.   Pt denies polydipsia, polyuria, or new focal neuro s/s.    Pt denies fever, wt loss, night sweats, loss of appetite, or other constitutional symptoms     Wt Readings from Last 3 Encounters:  04/06/24 202 lb (91.6 kg)  04/01/24 201 lb (91.2 kg)  02/19/24 210 lb (95.3 kg)   BP Readings from Last 3 Encounters:  04/06/24 120/64  02/19/24 130/86  12/24/23 108/62   Immunization History  Administered Date(s) Administered   Fluad Quad(high Dose 65+) 12/13/2018, 02/07/2020   Fluad Trivalent(High Dose 65+) 12/04/2022   INFLUENZA, HIGH DOSE SEASONAL PF 12/10/2014, 11/04/2016, 11/10/2017, 03/11/2024   Influenza Split 12/13/2010, 12/22/2012   Influenza Whole 12/09/2007, 12/13/2009   Influenza, Seasonal, Injecte, Preservative Fre 12/08/2012, 11/16/2014   Influenza,inj,Quad PF,6+ Mos 11/07/2015   Influenza-Unspecified 12/08/2012, 02/06/2021, 12/23/2021   PFIZER(Purple Top)SARS-COV-2 Vaccination 04/03/2019, 05/31/2019   Pneumococcal Conjugate Pcv21, Polysaccharide Crm197 Conjugaf 07/03/2023   Pneumococcal Conjugate-13 03/22/2013   Pneumococcal Polysaccharide-23 01/08/2006, 03/25/2011   RSV,unspecified 12/23/2021   Respiratory Syncytial Virus Vaccine,Recomb Aduvanted(Arexvy) 11/13/2021   Td 05/17/2009   Td (Adult),5 Lf Tetanus Toxid, Preservative Free 05/17/2009   Tdap 02/07/2020   Health Maintenance Due  Topic Date Due   Zoster Vaccines- Shingrix (1 of 2) Never done   COVID-19 Vaccine (3 - Pfizer risk series) 06/28/2019    OPHTHALMOLOGY EXAM  02/23/2024      Past Medical History:  Diagnosis Date   Abdominal pain, epigastric 04/11/2010   BELCHING 04/23/2010   BRADYCARDIA, CHRONIC 10/24/2006   CARPAL TUNNEL SYNDROME, BILATERAL 02/07/2009   CHEST PAIN-UNSPECIFIED 09/05/2008   CHOLELITHIASIS 04/22/2010   Cholelithiasis 06/13/2010   COLONIC POLYPS, HX OF 04/28/2007   DEGENERATIVE JOINT DISEASE, RIGHT KNEE 10/24/2006   Depression 02/23/2011   DIABETES MELLITUS, TYPE II 04/28/2007   Dizziness and giddiness 12/19/2009   Elevated PSA 06/13/2010   FATIGUE 04/28/2007   GERD 02/11/2009   Headache(784.0) 12/19/2009   HYPERLIPIDEMIA 04/28/2007   HYPERTENSION 10/21/2006   NECK MASS 02/07/2010   OBESITY 10/24/2006   OTITIS MEDIA, ACUTE, LEFT 12/26/2009   PHIMOSIS 02/07/2009   S/P laparoscopic cholecystectomy 10/31/2010   Thyroid  cancer (HCC) 06/13/2010   THYROID  NODULE 02/07/2010   Past Surgical History:  Procedure Laterality Date   CHOLECYSTECTOMY     left knee surgery     right wrist surgury     THYROID  SURGERY      reports that he has quit smoking. He has never used smokeless tobacco. He reports that he does not drink alcohol and does not use drugs. family history includes Arthritis in his mother; Breast cancer in his sister; Colon cancer (age of onset: 11) in his brother; Diabetes in his brother; Goiter in his mother; Heart attack (age of onset: 64) in his brother; Heart attack (age of onset: 65) in his father; Hypertension in his mother;  Hypothyroidism in his sister. Allergies[1] Medications Ordered Prior to Encounter[2]      ROS:  All others reviewed and negative.  Objective        PE:  BP 120/64 (BP Location: Right Arm, Patient Position: Sitting, Cuff Size: Normal)   Pulse (!) 59   Temp 97.8 F (36.6 C) (Oral)   Ht 5' 11 (1.803 m)   Wt 202 lb (91.6 kg)   SpO2 97%   BMI 28.17 kg/m                 Constitutional: Pt appears in NAD               HENT: Head: NCAT.                Right Ear: External ear normal.                  Left Ear: External ear normal.                Eyes: . Pupils are equal, round, and reactive to light. Conjunctivae and EOM are normal               Nose: without d/c or deformity               Neck: Neck supple. Gross normal ROM               Cardiovascular: Normal rate and regular rhythm.                 Pulmonary/Chest: Effort normal and breath sounds without rales or wheezing.                Abd:  Soft, NT, ND, + BS, no organomegaly               Neurological: Pt is alert. At baseline orientation, motor grossly intact               Skin: Skin is warm. No rashes, no other new lesions, LE edema - none               Psychiatric: Pt behavior is normal without agitation   Micro: none  Cardiac tracings I have personally interpreted today:  none  Pertinent Radiological findings (summarize): none   Lab Results  Component Value Date   WBC 7.2 02/19/2024   HGB 12.3 (L) 02/19/2024   HCT 36.6 (L) 02/19/2024   PLT 445.0 (H) 02/19/2024   GLUCOSE 120 (H) 02/19/2024   CHOL 132 02/19/2024   TRIG 140.0 02/19/2024   HDL 40.40 02/19/2024   LDLDIRECT 80.0 03/18/2022   LDLCALC 64 02/19/2024   ALT 17 02/19/2024   AST 21 02/19/2024   NA 140 02/19/2024   K 4.1 02/19/2024   CL 105 02/19/2024   CREATININE 1.82 (H) 02/19/2024   BUN 25 (H) 02/19/2024   CO2 27 02/19/2024   TSH 1.98 02/19/2024   PSA 3.59 05/08/2017   INR 1.0 08/22/2008   HGBA1C 6.6 (H) 02/19/2024   MICROALBUR 3.7 (H) 02/19/2024   Assessment/Plan:  Nathan Russo is a 84 y.o. White or Caucasian [1] male with  has a past medical history of Abdominal pain, epigastric (04/11/2010), BELCHING (04/23/2010), BRADYCARDIA, CHRONIC (10/24/2006), CARPAL TUNNEL SYNDROME, BILATERAL (02/07/2009), CHEST PAIN-UNSPECIFIED (09/05/2008), CHOLELITHIASIS (04/22/2010), Cholelithiasis (06/13/2010), COLONIC POLYPS, HX OF (04/28/2007), DEGENERATIVE JOINT DISEASE, RIGHT KNEE (10/24/2006), Depression (02/23/2011), DIABETES MELLITUS, TYPE II (04/28/2007),  Dizziness and giddiness (12/19/2009), Elevated PSA (06/13/2010), FATIGUE (04/28/2007), GERD (02/11/2009), Headache(784.0) (  12/19/2009), HYPERLIPIDEMIA (04/28/2007), HYPERTENSION (10/21/2006), NECK MASS (02/07/2010), OBESITY (10/24/2006), OTITIS MEDIA, ACUTE, LEFT (12/26/2009), PHIMOSIS (02/07/2009), S/P laparoscopic cholecystectomy (10/31/2010), Thyroid  cancer (HCC) (06/13/2010), and THYROID  NODULE (02/07/2010).  Diabetes mellitus (HCC) With neuropathy  Lab Results  Component Value Date   HGBA1C 6.6 (H) 02/19/2024   Stable, pt to continue current medical treatment  - diet, wt control   Preventative health care Age and sex appropriate education and counseling updated with regular exercise and diet Referrals for preventative services - for eye exam soon - pt to call Immunizations addressed - for shingrix at pharmacy Smoking counseling  - none needed Evidence for depression or other mood disorder - none significant Most recent labs reviewed. I have personally reviewed and have noted: 1) the patient's medical and social history 2) The patient's current medications and supplements 3) The patient's height, weight, and BMI have been recorded in the chart   Essential hypertension BP Readings from Last 3 Encounters:  04/06/24 120/64  02/19/24 130/86  12/24/23 108/62   Stable, pt to continue medical treatment norvasc  2.5 every day, toprol  xl 12.5 every day, losartan  100 mg qd   Hyperlipidemia Lab Results  Component Value Date   LDLCALC 64 02/19/2024   Stable, pt to continue current statin zetia  10 qd   Iron  deficiency anemia No overt bleeding, for f/u lab today  Followup: Return in about 6 months (around 10/04/2024).  Lynwood Rush, MD 04/06/2024 12:27 PM [facility]     [1]  Allergies Allergen Reactions   Diphenoxylate -Atropine  Other (See Comments)   Other Other (See Comments) and Hives    Causes body to ache Causes  body to ache   Sulfa Antibiotics Other (See Comments)    As child almost died   Ace Inhibitors     REACTION: cough   Atenolol     REACTION: bradycardia   Codeine Other (See Comments)    Head spins wild   Lovastatin     REACTION: myalygros   Morphine Nausea And Vomiting   Morphine And Codeine Other (See Comments)   Nsaids     Heart patient   Statins Other (See Comments)    Causes body to ache   Sulfamethoxazole Other (See Comments)    Childhood unknown reaction   Sulfasalazine Other (See Comments)    As child almost died   Tramadol      Ants crawling all over   Ibuprofen Other (See Comments)    Takes blood thinners  ibuprofen  [2]  Current Outpatient Medications on File Prior to Visit  Medication Sig Dispense Refill   DAPTOmycin (CUBICIN) 500 MG injection Inject into the vein.     ELIQUIS  2.5 MG TABS tablet Take 2.5 mg by mouth 2 (two) times daily.     amiodarone  (PACERONE ) 200 MG tablet Take by mouth.     amLODipine  (NORVASC ) 2.5 MG tablet Take 1 tablet by mouth once daily 90 tablet 3   amoxicillin -clavulanate (AUGMENTIN ) 875-125 MG tablet Take 1 tablet by mouth 2 (two) times daily. (Patient not taking: Reported on 04/01/2024) 20 tablet 0   apixaban  (ELIQUIS ) 5 MG TABS tablet Take 2.5 mg by mouth 2 (two) times daily.     Ascorbic Acid (VITAMIN C) 1000 MG tablet Take 1,000 mg by mouth daily.     augmented betamethasone  dipropionate (DIPROLENE -AF) 0.05 % ointment betamethasone , augmented 0.05 % topical ointment  APPLY OINTMENT TOPICALLY TO EACH EAR AS NEEDED FOR ITCHING  Blood Glucose Calibration (ACCU-CHEK GUIDE CONTROL) LIQD Use as directed once daily as needed 1 each 3   Blood Glucose Monitoring Suppl (ACCU-CHEK GUIDE ME) w/Device KIT Use as directed once daily E11.9 1 kit 1   Calcium  Carb-Cholecalciferol 206 531 0573 MG-UNIT CAPS Take by mouth.     ciclopirox (PENLAC) 8 % solution      Cinnamon 500 MG capsule Take 500 mg by mouth 2 (two) times daily.     Continuous  Glucose Receiver (DEXCOM G7 RECEIVER) DEVI Use as directed once daily E11.9 (Patient not taking: Reported on 04/01/2024) 1 each 0   Continuous Glucose Sensor (DEXCOM G7 SENSOR) MISC Use as directed once every 10 days E11.9 (Patient not taking: Reported on 04/01/2024) 9 each 3   cyanocobalamin  1000 MCG tablet Take 1,000 mcg by mouth daily.     ezetimibe  (ZETIA ) 10 MG tablet Take 1 tablet by mouth once daily 90 tablet 0   FERREX 150 150 MG capsule Take 1 capsule by mouth once daily 30 capsule 0   Flaxseed, Linseed, 1000 MG CAPS Take 1 capsule by mouth daily.     Garlic Oil 500 MG TABS Take by mouth 2 (two) times daily.     Ginger, Zingiber officinalis, (GINGER PO) Take 1 tablet by mouth in the morning and at bedtime.     Glucosamine-Chondroit-Vit C-Mn (GLUCOSAMINE CHONDROITIN COMPLX) CAPS Take by mouth daily.     Glucosamine-Chondroitin 250-200 MG TABS Take 1 tablet by mouth at bedtime.     glucose blood (ACCU-CHEK GUIDE TEST) test strip Use as instructed 4 times every day E11.9 400 each 12   hydrocortisone 2.5 % ointment Apply 1 Application topically 2 (two) times daily.     isosorbide  mononitrate (IMDUR ) 60 MG 24 hr tablet Take by mouth.     ketoconazole (NIZORAL) 2 % cream APPLY 1 GRAM OF CREAM TOPICALLY TO THE WAISTLINE TWICE DAILY     Lancets MISC Use as directed three times per day E11.9 300 each 3   levothyroxine  (SYNTHROID ) 112 MCG tablet Take 112 mcg by mouth every morning.     levothyroxine  (SYNTHROID ) 125 MCG tablet Take 1 tablet (125 mcg total) by mouth daily. (Patient not taking: Reported on 04/01/2024) 90 tablet 3   levothyroxine  (SYNTHROID ) 25 MCG tablet TAKE 1 TABLET BY MOUTH THREE TIMES A WEEK ON MONDAY, WEDNESDAY, AND FRIDAY IN ADDITION TO YOUR REGULAR DOSE.     losartan  (COZAAR ) 100 MG tablet Take 1 tablet (100 mg total) by mouth daily. 90 tablet 3   meclizine  (ANTIVERT ) 25 MG tablet Take 25 mg by mouth.     methylPREDNISolone  (MEDROL  DOSEPAK) 4 MG TBPK tablet 4 tab by mouth once  daily x 3 days, 2 tabs x 3 days, 1 tab x 3 days (Patient not taking: Reported on 04/01/2024) 21 tablet 0   metoprolol  succinate (TOPROL -XL) 25 MG 24 hr tablet 12.5 mg daily. (Patient not taking: Reported on 04/01/2024)     Multiple Vitamin (MULTIVITAMIN) capsule Take 1 capsule by mouth daily.     nitroGLYCERIN  (NITROSTAT ) 0.4 MG SL tablet 0.4 mg every 5 (five) minutes as needed for chest pain.     Omega-3 Fatty Acids (FISH OIL) 1000 MG CAPS Take by mouth 2 (two) times daily.     ondansetron  (ZOFRAN -ODT) 4 MG disintegrating tablet Take 4 mg by mouth.     pantoprazole  (PROTONIX ) 40 MG tablet Take 1 tablet (40 mg total) by mouth 2 (two) times daily. 180 tablet 2   prasugrel  (EFFIENT ) 10 MG  TABS tablet Take 10 mg by mouth daily.     tamsulosin  (FLOMAX ) 0.4 MG CAPS capsule Take 1 capsule (0.4 mg total) by mouth daily. 90 capsule 3   triamcinolone  (KENALOG ) 0.025 % cream Apply topically.     VITAMIN D , CHOLECALCIFEROL, PO Take by mouth daily.     No current facility-administered medications on file prior to visit.

## 2024-04-06 NOTE — Assessment & Plan Note (Signed)
 Lab Results  Component Value Date   LDLCALC 64 02/19/2024   Stable, pt to continue current statin zetia  10 qd

## 2024-04-06 NOTE — Assessment & Plan Note (Signed)
 Age and sex appropriate education and counseling updated with regular exercise and diet Referrals for preventative services - for eye exam soon - pt to call Immunizations addressed - for shingrix at pharmacy Smoking counseling  - none needed Evidence for depression or other mood disorder - none significant Most recent labs reviewed. I have personally reviewed and have noted: 1) the patient's medical and social history 2) The patient's current medications and supplements 3) The patient's height, weight, and BMI have been recorded in the chart

## 2024-04-06 NOTE — Assessment & Plan Note (Signed)
 BP Readings from Last 3 Encounters:  04/06/24 120/64  02/19/24 130/86  12/24/23 108/62   Stable, pt to continue medical treatment norvasc  2.5 every day, toprol  xl 12.5 every day, losartan  100 mg qd

## 2024-04-06 NOTE — Assessment & Plan Note (Addendum)
 With neuropathy  Lab Results  Component Value Date   HGBA1C 6.6 (H) 02/19/2024   Stable, pt to continue current medical treatment  - diet, wt control

## 2024-04-06 NOTE — Patient Instructions (Signed)

## 2024-04-07 ENCOUNTER — Ambulatory Visit: Payer: Self-pay | Admitting: Internal Medicine

## 2024-04-07 NOTE — Progress Notes (Signed)
 The test results show that your current treatment is OK, as the tests are stable.  Please continue the same plan.  There is no other need for change of treatment or further evaluation based on these results, at this time.  thanks
